# Patient Record
Sex: Male | Born: 1955
Health system: Southern US, Community
[De-identification: ages and names within clinical notes are randomized; demographics above are authoritative.]

## PROBLEM LIST (undated history)

## (undated) DIAGNOSIS — I10 Essential (primary) hypertension: Secondary | ICD-10-CM

## (undated) DIAGNOSIS — I513 Intracardiac thrombosis, not elsewhere classified: Secondary | ICD-10-CM

## (undated) DIAGNOSIS — I509 Heart failure, unspecified: Secondary | ICD-10-CM

---

## 2015-11-04 ENCOUNTER — Encounter (HOSPITAL_COMMUNITY): Payer: Self-pay | Admitting: Emergency Medicine

## 2015-11-04 ENCOUNTER — Emergency Department (HOSPITAL_COMMUNITY): Payer: Medicaid Other

## 2015-11-04 DIAGNOSIS — D62 Acute posthemorrhagic anemia: Secondary | ICD-10-CM | POA: Diagnosis not present

## 2015-11-04 DIAGNOSIS — Z79899 Other long term (current) drug therapy: Secondary | ICD-10-CM

## 2015-11-04 DIAGNOSIS — Z7901 Long term (current) use of anticoagulants: Secondary | ICD-10-CM

## 2015-11-04 DIAGNOSIS — R17 Unspecified jaundice: Secondary | ICD-10-CM | POA: Diagnosis present

## 2015-11-04 DIAGNOSIS — I5043 Acute on chronic combined systolic (congestive) and diastolic (congestive) heart failure: Secondary | ICD-10-CM | POA: Diagnosis present

## 2015-11-04 DIAGNOSIS — I214 Non-ST elevation (NSTEMI) myocardial infarction: Principal | ICD-10-CM | POA: Diagnosis present

## 2015-11-04 DIAGNOSIS — I472 Ventricular tachycardia: Secondary | ICD-10-CM | POA: Diagnosis not present

## 2015-11-04 DIAGNOSIS — D7589 Other specified diseases of blood and blood-forming organs: Secondary | ICD-10-CM | POA: Diagnosis present

## 2015-11-04 DIAGNOSIS — K648 Other hemorrhoids: Secondary | ICD-10-CM | POA: Diagnosis present

## 2015-11-04 DIAGNOSIS — N179 Acute kidney failure, unspecified: Secondary | ICD-10-CM | POA: Diagnosis not present

## 2015-11-04 DIAGNOSIS — Z23 Encounter for immunization: Secondary | ICD-10-CM

## 2015-11-04 DIAGNOSIS — I447 Left bundle-branch block, unspecified: Secondary | ICD-10-CM | POA: Diagnosis present

## 2015-11-04 DIAGNOSIS — R570 Cardiogenic shock: Secondary | ICD-10-CM | POA: Diagnosis present

## 2015-11-04 DIAGNOSIS — R634 Abnormal weight loss: Secondary | ICD-10-CM | POA: Diagnosis present

## 2015-11-04 DIAGNOSIS — I429 Cardiomyopathy, unspecified: Secondary | ICD-10-CM | POA: Diagnosis present

## 2015-11-04 DIAGNOSIS — Z87891 Personal history of nicotine dependence: Secondary | ICD-10-CM

## 2015-11-04 DIAGNOSIS — I110 Hypertensive heart disease with heart failure: Secondary | ICD-10-CM | POA: Diagnosis present

## 2015-11-04 DIAGNOSIS — D509 Iron deficiency anemia, unspecified: Secondary | ICD-10-CM | POA: Diagnosis present

## 2015-11-04 LAB — CBC
HEMATOCRIT: 39.5 % (ref 39.0–52.0)
Hemoglobin: 12.8 g/dL — ABNORMAL LOW (ref 13.0–17.0)
MCH: 33.2 pg (ref 26.0–34.0)
MCHC: 32.4 g/dL (ref 30.0–36.0)
MCV: 102.3 fL — ABNORMAL HIGH (ref 78.0–100.0)
Platelets: 240 10*3/uL (ref 150–400)
RBC: 3.86 MIL/uL — ABNORMAL LOW (ref 4.22–5.81)
RDW: 14.1 % (ref 11.5–15.5)
WBC: 4.3 10*3/uL (ref 4.0–10.5)

## 2015-11-04 LAB — BASIC METABOLIC PANEL
ANION GAP: 13 (ref 5–15)
BUN: 24 mg/dL — AB (ref 6–20)
CALCIUM: 9.2 mg/dL (ref 8.9–10.3)
CO2: 22 mmol/L (ref 22–32)
Chloride: 103 mmol/L (ref 101–111)
Creatinine, Ser: 1.6 mg/dL — ABNORMAL HIGH (ref 0.61–1.24)
GFR calc Af Amer: 53 mL/min — ABNORMAL LOW (ref >60)
GFR, EST NON AFRICAN AMERICAN: 46 mL/min — AB (ref >60)
Glucose, Bld: 149 mg/dL — ABNORMAL HIGH (ref 65–99)
POTASSIUM: 3.7 mmol/L (ref 3.5–5.1)
SODIUM: 138 mmol/L (ref 135–145)

## 2015-11-04 LAB — PROTIME-INR
INR: 2 — ABNORMAL HIGH (ref 0.00–1.49)
Prothrombin Time: 22.6 seconds — ABNORMAL HIGH (ref 11.6–15.2)

## 2015-11-04 LAB — I-STAT TROPONIN, ED: TROPONIN I, POC: 0.16 ng/mL — AB (ref 0.00–0.08)

## 2015-11-04 NOTE — ED Notes (Signed)
Pt. reports left chest pain radiating to left arm with SOB onset this evening , denies nausea or diaphoresis .

## 2015-11-05 ENCOUNTER — Inpatient Hospital Stay (HOSPITAL_COMMUNITY)
Admission: EM | Admit: 2015-11-05 | Discharge: 2015-12-06 | DRG: 001 | Disposition: A | Discharge status: Home Health | Payer: Medicaid Other | Attending: Surgery | Admitting: Surgery

## 2015-11-05 DIAGNOSIS — K648 Other hemorrhoids: Secondary | ICD-10-CM | POA: Diagnosis present

## 2015-11-05 DIAGNOSIS — I509 Heart failure, unspecified: Secondary | ICD-10-CM

## 2015-11-05 DIAGNOSIS — Z7901 Long term (current) use of anticoagulants: Secondary | ICD-10-CM | POA: Diagnosis not present

## 2015-11-05 DIAGNOSIS — D509 Iron deficiency anemia, unspecified: Secondary | ICD-10-CM | POA: Diagnosis not present

## 2015-11-05 DIAGNOSIS — I447 Left bundle-branch block, unspecified: Secondary | ICD-10-CM | POA: Diagnosis present

## 2015-11-05 DIAGNOSIS — Z23 Encounter for immunization: Secondary | ICD-10-CM | POA: Diagnosis not present

## 2015-11-05 DIAGNOSIS — I8290 Acute embolism and thrombosis of unspecified vein: Secondary | ICD-10-CM | POA: Diagnosis not present

## 2015-11-05 DIAGNOSIS — D649 Anemia, unspecified: Secondary | ICD-10-CM | POA: Diagnosis not present

## 2015-11-05 DIAGNOSIS — Z01818 Encounter for other preprocedural examination: Secondary | ICD-10-CM | POA: Diagnosis not present

## 2015-11-05 DIAGNOSIS — E440 Moderate protein-calorie malnutrition: Secondary | ICD-10-CM | POA: Insufficient documentation

## 2015-11-05 DIAGNOSIS — R634 Abnormal weight loss: Secondary | ICD-10-CM | POA: Diagnosis present

## 2015-11-05 DIAGNOSIS — N183 Chronic kidney disease, stage 3 (moderate): Secondary | ICD-10-CM | POA: Diagnosis not present

## 2015-11-05 DIAGNOSIS — R079 Chest pain, unspecified: Secondary | ICD-10-CM | POA: Diagnosis present

## 2015-11-05 DIAGNOSIS — R570 Cardiogenic shock: Secondary | ICD-10-CM | POA: Diagnosis not present

## 2015-11-05 DIAGNOSIS — I5043 Acute on chronic combined systolic (congestive) and diastolic (congestive) heart failure: Secondary | ICD-10-CM

## 2015-11-05 DIAGNOSIS — D508 Other iron deficiency anemias: Secondary | ICD-10-CM | POA: Insufficient documentation

## 2015-11-05 DIAGNOSIS — I429 Cardiomyopathy, unspecified: Secondary | ICD-10-CM | POA: Diagnosis present

## 2015-11-05 DIAGNOSIS — I214 Non-ST elevation (NSTEMI) myocardial infarction: Principal | ICD-10-CM

## 2015-11-05 DIAGNOSIS — R17 Unspecified jaundice: Secondary | ICD-10-CM | POA: Diagnosis present

## 2015-11-05 DIAGNOSIS — Z1211 Encounter for screening for malignant neoplasm of colon: Secondary | ICD-10-CM | POA: Diagnosis not present

## 2015-11-05 DIAGNOSIS — I5022 Chronic systolic (congestive) heart failure: Secondary | ICD-10-CM

## 2015-11-05 DIAGNOSIS — I428 Other cardiomyopathies: Secondary | ICD-10-CM

## 2015-11-05 DIAGNOSIS — Z79899 Other long term (current) drug therapy: Secondary | ICD-10-CM | POA: Diagnosis not present

## 2015-11-05 DIAGNOSIS — R0602 Shortness of breath: Secondary | ICD-10-CM

## 2015-11-05 DIAGNOSIS — Z87891 Personal history of nicotine dependence: Secondary | ICD-10-CM | POA: Diagnosis not present

## 2015-11-05 DIAGNOSIS — Z515 Encounter for palliative care: Secondary | ICD-10-CM | POA: Diagnosis not present

## 2015-11-05 DIAGNOSIS — I5023 Acute on chronic systolic (congestive) heart failure: Secondary | ICD-10-CM | POA: Diagnosis not present

## 2015-11-05 DIAGNOSIS — I472 Ventricular tachycardia: Secondary | ICD-10-CM | POA: Diagnosis not present

## 2015-11-05 DIAGNOSIS — Z95811 Presence of heart assist device: Secondary | ICD-10-CM

## 2015-11-05 DIAGNOSIS — D7589 Other specified diseases of blood and blood-forming organs: Secondary | ICD-10-CM | POA: Diagnosis present

## 2015-11-05 DIAGNOSIS — Z9689 Presence of other specified functional implants: Secondary | ICD-10-CM

## 2015-11-05 DIAGNOSIS — I110 Hypertensive heart disease with heart failure: Secondary | ICD-10-CM | POA: Diagnosis present

## 2015-11-05 DIAGNOSIS — D62 Acute posthemorrhagic anemia: Secondary | ICD-10-CM | POA: Diagnosis not present

## 2015-11-05 DIAGNOSIS — I213 ST elevation (STEMI) myocardial infarction of unspecified site: Secondary | ICD-10-CM | POA: Diagnosis not present

## 2015-11-05 DIAGNOSIS — I513 Intracardiac thrombosis, not elsewhere classified: Secondary | ICD-10-CM | POA: Diagnosis present

## 2015-11-05 DIAGNOSIS — I519 Heart disease, unspecified: Secondary | ICD-10-CM | POA: Diagnosis not present

## 2015-11-05 DIAGNOSIS — J181 Lobar pneumonia, unspecified organism: Secondary | ICD-10-CM | POA: Diagnosis not present

## 2015-11-05 DIAGNOSIS — N179 Acute kidney failure, unspecified: Secondary | ICD-10-CM | POA: Diagnosis not present

## 2015-11-05 HISTORY — DX: Heart failure, unspecified: I50.9

## 2015-11-05 HISTORY — DX: Intracardiac thrombosis, not elsewhere classified: I51.3

## 2015-11-05 HISTORY — DX: Essential (primary) hypertension: I10

## 2015-11-05 LAB — PROTIME-INR
INR: 2.08 — ABNORMAL HIGH (ref 0.00–1.49)
PROTHROMBIN TIME: 23.2 s — AB (ref 11.6–15.2)

## 2015-11-05 LAB — TROPONIN I
TROPONIN I: 0.78 ng/mL — AB (ref <0.031)
TROPONIN I: 4.8 ng/mL — AB (ref <0.031)
Troponin I: 2.48 ng/mL (ref <0.031)

## 2015-11-05 LAB — BASIC METABOLIC PANEL
Anion gap: 16 — ABNORMAL HIGH (ref 5–15)
BUN: 25 mg/dL — AB (ref 6–20)
CHLORIDE: 105 mmol/L (ref 101–111)
CO2: 17 mmol/L — ABNORMAL LOW (ref 22–32)
CREATININE: 1.49 mg/dL — AB (ref 0.61–1.24)
Calcium: 8.9 mg/dL (ref 8.9–10.3)
GFR, EST AFRICAN AMERICAN: 58 mL/min — AB (ref >60)
GFR, EST NON AFRICAN AMERICAN: 50 mL/min — AB (ref >60)
Glucose, Bld: 116 mg/dL — ABNORMAL HIGH (ref 65–99)
POTASSIUM: 4 mmol/L (ref 3.5–5.1)
SODIUM: 138 mmol/L (ref 135–145)

## 2015-11-05 LAB — MAGNESIUM: Magnesium: 1.7 mg/dL (ref 1.7–2.4)

## 2015-11-05 LAB — MRSA PCR SCREENING: MRSA BY PCR: NEGATIVE

## 2015-11-05 LAB — HEPARIN LEVEL (UNFRACTIONATED)
HEPARIN UNFRACTIONATED: 0.51 [IU]/mL (ref 0.30–0.70)
Heparin Unfractionated: 0.25 IU/mL — ABNORMAL LOW (ref 0.30–0.70)

## 2015-11-05 MED ORDER — ASPIRIN 325 MG PO TABS
325.0000 mg | ORAL_TABLET | Freq: Once | ORAL | Status: AC
  Administered 2015-11-05: 325 mg via ORAL
  Filled 2015-11-05: qty 1

## 2015-11-05 MED ORDER — ATORVASTATIN CALCIUM 80 MG PO TABS
80.0000 mg | ORAL_TABLET | Freq: Every day | ORAL | Status: DC
  Administered 2015-11-05 – 2015-11-07 (×3): 80 mg via ORAL
  Filled 2015-11-05 (×3): qty 1

## 2015-11-05 MED ORDER — CETYLPYRIDINIUM CHLORIDE 0.05 % MT LIQD
7.0000 mL | Freq: Two times a day (BID) | OROMUCOSAL | Status: DC
  Administered 2015-11-05 – 2015-11-12 (×13): 7 mL via OROMUCOSAL

## 2015-11-05 MED ORDER — METOPROLOL TARTRATE 12.5 MG HALF TABLET
12.5000 mg | ORAL_TABLET | Freq: Every day | ORAL | Status: DC
  Administered 2015-11-05 – 2015-11-07 (×3): 12.5 mg via ORAL
  Filled 2015-11-05 (×3): qty 1

## 2015-11-05 MED ORDER — FUROSEMIDE 10 MG/ML IJ SOLN
40.0000 mg | Freq: Once | INTRAMUSCULAR | Status: AC
  Administered 2015-11-05: 40 mg via INTRAVENOUS
  Filled 2015-11-05: qty 4

## 2015-11-05 MED ORDER — ONDANSETRON HCL 4 MG/2ML IJ SOLN: 4.0000 mg | Freq: Four times a day (QID) | INTRAMUSCULAR | Status: DC | PRN

## 2015-11-05 MED ORDER — ACETAMINOPHEN 325 MG PO TABS
650.0000 mg | ORAL_TABLET | ORAL | Status: DC | PRN
  Administered 2015-11-05: 650 mg via ORAL
  Filled 2015-11-05: qty 2

## 2015-11-05 MED ORDER — NITROGLYCERIN 0.4 MG SL SUBL
0.4000 mg | SUBLINGUAL_TABLET | SUBLINGUAL | Status: DC | PRN
Start: 2015-11-05 — End: 2015-11-21

## 2015-11-05 MED ORDER — NITROGLYCERIN 0.4 MG SL SUBL
0.4000 mg | SUBLINGUAL_TABLET | SUBLINGUAL | Status: AC | PRN
  Administered 2015-11-05 (×3): 0.4 mg via SUBLINGUAL
  Filled 2015-11-05: qty 1

## 2015-11-05 MED ORDER — MAGNESIUM SULFATE 2 GM/50ML IV SOLN
2.0000 g | Freq: Once | INTRAVENOUS | Status: AC
  Administered 2015-11-05: 2 g via INTRAVENOUS
  Filled 2015-11-05: qty 50

## 2015-11-05 MED ORDER — NITROGLYCERIN IN D5W 200-5 MCG/ML-% IV SOLN
10.0000 ug/min | INTRAVENOUS | Status: DC
  Administered 2015-11-05: 10 ug/min via INTRAVENOUS
  Administered 2015-11-07: 15 ug/min via INTRAVENOUS
  Filled 2015-11-05 (×2): qty 250

## 2015-11-05 MED ORDER — HEPARIN (PORCINE) IN NACL 100-0.45 UNIT/ML-% IJ SOLN
1250.0000 [IU]/h | INTRAMUSCULAR | Status: DC
  Administered 2015-11-05: 1100 [IU]/h via INTRAVENOUS
  Administered 2015-11-06 (×2): 1250 [IU]/h via INTRAVENOUS
  Filled 2015-11-05 (×3): qty 250

## 2015-11-05 MED ORDER — ASPIRIN EC 81 MG PO TBEC
81.0000 mg | DELAYED_RELEASE_TABLET | Freq: Every day | ORAL | Status: DC
  Administered 2015-11-06: 81 mg via ORAL
  Filled 2015-11-05: qty 1

## 2015-11-05 NOTE — Progress Notes (Signed)
ANTICOAGULATION CONSULT NOTE - Initial Consult  Pharmacy Consult for Heparin Indication: NSTEMI  No Known Allergies  Patient Measurements: Height: 5\' 11"  (180.3 cm) Weight: 166 lb 6.4 oz (75.479 kg) IBW/kg (Calculated) : 75.3  Vital Signs: Temp: 98.8 F (37.1 C) (04/22 0509) Temp Source: Oral (04/22 0509) BP: 109/90 mmHg (04/22 0821) Pulse Rate: 89 (04/22 0821)  Labs:  Recent Labs  11/04/15 2248 11/05/15 0130 11/05/15 5364 11/05/15 1033 11/05/15 1056  HGB 12.8*  --   --   --   --   HCT 39.5  --   --   --   --   PLT 240  --   --   --   --   LABPROT 22.6*  --  23.2*  --   --   INR 2.00*  --  2.08*  --   --   HEPARINUNFRC  --   --   --  0.25*  --   CREATININE 1.60*  --   --   --  1.49*  TROPONINI  --  0.78* 2.48*  --   --     Estimated Creatinine Clearance: 57.6 mL/min (by C-G formula based on Cr of 1.49).   Medical History: Past Medical History  Diagnosis Date  . CHF (congestive heart failure) (HCC)   . Hypertension     Medications:  See electronic med rec  Assessment: 60 y.o. M presents with NSTEMI. Trop up to 0.78>2.48. Pt on coumadin PTA for LV thrombus. Admit INR 2>2.08. CBC ok. Plan to hold coumadin and start heparin bridge with potential need for cath. HL this AM is low at 0.25 on 1100 units/hr.   Goal of Therapy:  Heparin level 0.3-0.7 units/ml Monitor platelets by anticoagulation protocol: Yes   Plan:  - Increase heparin infusion to 1250 units/hr - 6-hr HL @ 1730 - Daily HL, CBC - INR again in a.m. to verify that trending down  Schuyler Behan L. Roseanne Reno, PharmD PGY2 Infectious Diseases Pharmacy Resident Pager: [phone] 11/05/2015 11:28 AM

## 2015-11-05 NOTE — ED Notes (Signed)
Family at bedside. 

## 2015-11-05 NOTE — Progress Notes (Signed)
CCMD called about patient having 4 beats v-tach, as well as increased HR just sitting still up to 130's. Pt states, "I felt some fluttering". Paged Loney Loh, MD. MD verbal order for Magnesium level. Will continue to monitor closely.

## 2015-11-05 NOTE — ED Notes (Signed)
Attempted to call report on pt.

## 2015-11-05 NOTE — Progress Notes (Signed)
ANTICOAGULATION CONSULT NOTE - Initial Consult  Pharmacy Consult for Heparin Indication: NSTEMI  No Known Allergies  Patient Measurements: Height: 5\' 11"  (180.3 cm) Weight: 166 lb (75.297 kg) IBW/kg (Calculated) : 75.3  Vital Signs: Temp: 97.6 F (36.4 C) (04/21 2241) Temp Source: Oral (04/21 2241) BP: 95/79 mmHg (04/22 0330) Pulse Rate: 91 (04/22 0330)  Labs:  Recent Labs  11/04/15 2248 11/05/15 0130  HGB 12.8*  --   HCT 39.5  --   PLT 240  --   LABPROT 22.6*  --   INR 2.00*  --   CREATININE 1.60*  --   TROPONINI  --  0.78*    Estimated Creatinine Clearance: 53.6 mL/min (by C-G formula based on Cr of 1.6).   Medical History: Past Medical History  Diagnosis Date  . CHF (congestive heart failure) (HCC)   . Hypertension     Medications:  See electronic med rec  Assessment: 60 y.o. M presents with NSTEMI. Trop up to 0.78. Pt on coumadin PTA for LV thrombus. Admit INR 2. CBC ok on at baseline. Plan to hold coumadin and start heparin bridge with potential need for cath.  Goal of Therapy:  Heparin level 0.3-0.7 units/ml Monitor platelets by anticoagulation protocol: Yes   Plan:  Heparin gtt at 1100 units/hr. No bolus. Will f/u heparin level in 6 hours Daily heparin level and CBC INR in a.m. to verify that trending down  Christoper Fabian, PharmD, BCPS Clinical pharmacist, pager [phone] 11/05/2015,4:07 AM

## 2015-11-05 NOTE — H&P (Signed)
Patient ID: Matthew Vaughan MRN: [id number], DOB/AGE: [birth year]   Admit date: 11/05/2015  Primary Physician: No primary care provider on file. Primary Cardiologist: [facility] Reason for admission:  ADHF/NSTEMI  Pt. Profile:  Matthew Vaughan is a 60 y.o. male with a self-reported history of NICM and chronic systolic heart failure with LVEF=20% and HTN who presented to the ED with 3 days of worsening DOE and intermittent SSCP.  The pt is followed by a cardiologist at [facility] who manages his heart failure, and he notes that this physician increased his furosemide last week given worsening volume status.  Unfortunately he continued to develop worsening LE edema and abdominal distension over the past several days, and 3 days ago he began to experience SSCP.  The pain occurs both at rest and with exertion, but this evening came on at rest and at a greater intensity than previous, prompting presentation to the ED.  The pain is described as "pressure" and "heaviness", with radiation to the left arm, and is associated with an increase in his baseline dyspnea.  Since arrival to the ED Matthew Vaughan has remained hemodynamically stable and saturating well on RA.  Initial troponin drawn in the waiting room was 0.16 and repeat 4 hours later was 0.78.  Notably, the patient did not receive SLNG until this second troponin was finalized, and 2 tablets reduced his pain from a "8/10" to a "4/10".  The pt is currently resting comfortably in bed, laying at a 45 degree angle.  He continues to endorse mild SSCP.  He denies recent PND, but has experienced orthopnea over recent days in addition to the LE swelling and increased abdominal girth.   Problem List  Past Medical History  Diagnosis Date  . CHF (congestive heart failure) (HCC)   . Hypertension     History reviewed. No pertinent past surgical history.   Allergies  No Known Allergies   Home Medications  Prior to Admission medications    Medication Sig Start Date End Date Taking? Authorizing Provider  LASIX 40 MG tablet Take 40-80 mg by mouth See admin instructions. Alternate taking 1 tablet one day then take 2 tablets the next day 10/25/15  Yes Historical Provider, MD  metoprolol tartrate (LOPRESSOR) 25 MG tablet Take 12.5 mg by mouth daily.   Yes Historical Provider, MD  warfarin (COUMADIN) 2.5 MG tablet Take 2.5 mg by mouth daily. 10/13/15  Yes Historical Provider, MD    Family History Father with HF, otherwise reviewed and non-contributory   Social History  Social History   Social History  . Marital Status: Divorced    Spouse Name: N/A  . Number of Children: N/A  . Years of Education: N/A   Occupational History  . Not on file.   Social History Main Topics  . Smoking status: Former Games developer  . Smokeless tobacco: Not on file  . Alcohol Use: No  . Drug Use: No  . Sexual Activity: Not on file   Other Topics Concern  . Not on file   Social History Narrative     Review of Systems General:  No chills, fever, night sweats or weight changes.  Cardiovascular:  As per HPI Dermatological: No rash, lesions/masses Respiratory: As per HPI Urologic: No hematuria, dysuria Abdominal:   No nausea, vomiting, diarrhea, bright red blood per rectum, melena, or hematemesis Neurologic:  No visual changes, wkns, changes in mental status. All other systems reviewed and are otherwise negative except as noted above.  Physical Exam  Blood pressure 97/84, pulse 87, temperature 97.6 F (36.4 C), temperature source Oral, resp. rate 28, height  (1.803 m), weight 75.297 kg (166 lb), SpO2 100 %.  General: Pleasant, NAD Psych: Normal affect. Neuro: Alert and oriented X 3. Moves all extremities spontaneously. HEENT: Normal, mmm  Neck: Supple without bruits, +JVD Lungs:  Use of accessory muscles of respiration but lungs CTAB Heart: tachycardic, regular rhythm, +S1 +S2, no appreciable m/r/g, JVP at 15cm and w/ +hepatojugular  reflex, 2+ pitting edema to the mid-tibia bilaterally, feet mildly cool to the touch Abdomen: Soft, mildly distended, NTTP throughout Extremities: No clubbing, cyanosis. DP/PT/Radials 2+ and equal bilaterally.  Labs   Recent Labs  11/05/15 0130  TROPONINI 0.78*   Lab Results  Component Value Date   WBC 4.3 11/04/2015   HGB 12.8* 11/04/2015   HCT 39.5 11/04/2015   MCV 102.3* 11/04/2015   PLT 240 11/04/2015    Recent Labs Lab 11/04/15 2248  NA 138  K 3.7  CL 103  CO2 22  BUN 24*  CREATININE 1.60*  CALCIUM 9.2  GLUCOSE 149*   No results found for: CHOL, HDL, LDLCALC, TRIG No results found for: DDIMER   Radiology/Studies  Dg Chest 2 View  11/04/2015  CLINICAL DATA:  Chest pain and shortness of breath for 1 day EXAM: CHEST  2 VIEW COMPARISON:  None. FINDINGS: Cardiac enlargement without vascular congestion. Focal area of nodular consolidation in the right upper lung probably represents focal pneumonia. Soft tissue lesion is not excluded. Followup PA and lateral chest X-ray is recommended in 3-4 weeks following trial of antibiotic therapy to ensure resolution and exclude underlying malignancy. Left lung is clear. No blunting of costophrenic angles. No pneumothorax. Mediastinal contours appear intact. Degenerative changes in the spine and shoulders. IMPRESSION: Cardiac enlargement. Focal nodular consolidation at the right upper lung likely represent pneumonia. Followup PA and lateral chest X-ray is recommended in 3-4 weeks following trial of antibiotic therapy to ensure resolution and exclude underlying malignancy. Electronically Signed   By: Burman Nieves M.D.   On: 11/04/2015 23:00    ECG: per my review, tracing from this morning shows NSR with a LBBB   ASSESSMENT - NSTEMI - Acute on chronic systolic heart failure - non-ischemic cardiomyopathy - Renal insuffiencey of unknown chronicity - macrocytosis - report of LV thrombus, pt on warfarin  PLAN The pt has evidence  of acute decompensated heart failure with increasing DOE and edema over recent days and elevated JVP on examination today.  He reports normal coronary angiography during initial w/u for his cardiomyopathy a year and half ago, and he has experienced CP with previous heart failure exacerbations, albeit not as intense as the discomfort he experienced this evening.  Will treat ADHF with intravenous diuretics, but also manage for possible acute plaque rupture event with heparin gtt, full dose ASA, and high-intensity statin.  If chest pain does not further decrease, or should it again increase in intensity, then will discuss with interventional attending given possible need for coronary angiography. - furosemide  IV x 1 now - heparin gtt, both for NSTEMI management and reported LV thrombus.  In this regard, holding home warfarin given potential need for cath - Atorvastatin  PO QDAY - ASA  PO x 1 in ED, now  PO QDAY - Metoprolol 12.5mg  PO BID - nitro gtt if CP persists - next troponin check at 530am - continuous telemetry - obtain records from [place] in am    Signed, Ollen Gross Corotto,  MD  11/05/2015, 3:26 AM

## 2015-11-05 NOTE — Progress Notes (Signed)
ANTICOAGULATION CONSULT NOTE - f/u Consult  Pharmacy Consult for Heparin Indication: NSTEMI  No Known Allergies  Patient Measurements: Height: 5\' 11"  (180.3 cm) Weight: 166 lb 6.4 oz (75.479 kg) IBW/kg (Calculated) : 75.3  Vital Signs: Temp: 97.5 F (36.4 C) (04/22 1250) Temp Source: Oral (04/22 1250) BP: 102/76 mmHg (04/22 1250) Pulse Rate: 84 (04/22 1250)  Labs:  Recent Labs  11/04/15 2248 11/05/15 0130 11/05/15 0638 11/05/15 1033 11/05/15 1056 11/05/15 1213 11/05/15 1750  HGB 12.8*  --   --   --   --   --   --   HCT 39.5  --   --   --   --   --   --   PLT 240  --   --   --   --   --   --   LABPROT 22.6*  --  23.2*  --   --   --   --   INR 2.00*  --  2.08*  --   --   --   --   HEPARINUNFRC  --   --   --  0.25*  --   --  0.51  CREATININE 1.60*  --   --   --  1.49*  --   --   TROPONINI  --  0.78* 2.48*  --   --  4.80*  --     Estimated Creatinine Clearance: 57.6 mL/min (by C-G formula based on Cr of 1.49).   Medical History: Past Medical History  Diagnosis Date  . CHF (congestive heart failure) (HCC)   . Hypertension     Medications:  See electronic med rec  Assessment: 60 y.o. M presents with NSTEMI. Trop up to 0.78>2.48. Pt on coumadin PTA for LV thrombus. Admit INR 2>2.08. CBC ok. Plan to hold coumadin and start heparin bridge with potential need for cath. -- Heparin level 0.25 low this AM now in goal range at 0.51  Goal of Therapy:  Heparin level 0.3-0.7 units/ml Monitor platelets by anticoagulation protocol: Yes   Plan:  - Continue heparin infusion to 1250 units/hr - Daily HL, CBC - INR again in a.m. to verify that trending down  Nithila Sumners S. Merilynn Finland, PharmD, [facility] Clinical Staff Pharmacist Pager [phone]   11/05/2015 6:32 PM

## 2015-11-05 NOTE — ED Provider Notes (Signed)
CSN: [id number]     Arrival date & time 11/04/15  2230 History  By signing my name below, I, Phillis Haggis, attest that this documentation has been prepared under the direction and in the presence of No att. providers found. Electronically Signed: Phillis Haggis, ED Scribe. 11/05/2015. 1:39 AM.   Chief Complaint  Patient presents with  . Chest Pain   The history is provided by the patient. No language interpreter was used.    HPI Comments: Matthew Vaughan is a 60 y.o. Male with a hx of CHF and HTN who presents to the Emergency Department complaining of constant, gradually worsening, sharp, pressured left chest pain that radiates to the left arm onset 3 hours ago. Pt reports associated SOB. Pt states that he was sitting when the pain started. He reports that he is due to have a pacemaker placed on 11/29/15. He was told by his cardiologist that he has a blood clot in his heart and was started on Warfarin. He has not taken his warfarin today. Pt had a heart cath one year ago. He denies hx of heart attack. He denies vomiting, nausea, or diaphoresis.   Past Medical History  Diagnosis Date  . CHF (congestive heart failure) (HCC)   . Hypertension    History reviewed. No pertinent past surgical history. No family history on file. Social History  Substance Use Topics  . Smoking status: Former Games developer  . Smokeless tobacco: None  . Alcohol Use: No    Review of Systems 10 Systems reviewed and all are negative for acute change except as noted in the HPI.  Allergies  Review of patient's allergies indicates no known allergies.  Home Medications   Prior to Admission medications   Not on File   BP 114/98 mmHg  Pulse 102  Temp(Src) 97.6 F (36.4 C) (Oral)  Resp 20  Ht 5\' 11"  (1.803 m)  Wt 166 lb (75.297 kg)  BMI 23.16 kg/m2  SpO2 100% Physical Exam  Constitutional: He is oriented to person, place, and time. Vital signs are normal. He appears well-developed and well-nourished.  Non-toxic  appearance. He does not appear ill. No distress.  HENT:  Head: Normocephalic and atraumatic.  Nose: Nose normal.  Mouth/Throat: Oropharynx is clear and moist. No oropharyngeal exudate.  Eyes: Conjunctivae and EOM are normal. Pupils are equal, round, and reactive to light. No scleral icterus.  Neck: Normal range of motion. Neck supple. No tracheal deviation, no edema, no erythema and normal range of motion present. No thyroid mass and no thyromegaly present.  Cardiovascular: Normal rate, regular rhythm, S1 normal, S2 normal, normal heart sounds, intact distal pulses and normal pulses.  Exam reveals no gallop and no friction rub.   No murmur heard. Pulmonary/Chest: Effort normal and breath sounds normal. No respiratory distress. He has no wheezes. He has no rhonchi. He has no rales.  Abdominal: Soft. Normal appearance and bowel sounds are normal. He exhibits no distension, no ascites and no mass. There is no hepatosplenomegaly. There is no tenderness. There is no rebound, no guarding and no CVA tenderness.  Musculoskeletal: Normal range of motion. He exhibits edema. He exhibits no tenderness.  Bilateral lower extremity edema  Lymphadenopathy:    He has no cervical adenopathy.  Neurological: He is alert and oriented to person, place, and time. He has normal strength. No cranial nerve deficit or sensory deficit.  Skin: Skin is warm, dry and intact. No petechiae and no rash noted. He is not diaphoretic. No erythema. No pallor.  Psychiatric: He has a normal mood and affect. His behavior is normal. Judgment normal.  Nursing note and vitals reviewed.   ED Course  Procedures (including critical care time) DIAGNOSTIC STUDIES: Oxygen Saturation is 100% on RA, normal by my interpretation.    COORDINATION OF CARE: 1:43 AM-Discussed treatment plan which includes labs, EKG, and x-ray with pt at bedside and pt agreed to plan.    Labs Review Labs Reviewed  BASIC METABOLIC PANEL - Abnormal; Notable for  the following:    Glucose, Bld 149 (*)    BUN 24 (*)    Creatinine, Ser 1.60 (*)    GFR calc non Af Amer 46 (*)    GFR calc Af Amer 53 (*)    All other components within normal limits  CBC - Abnormal; Notable for the following:    RBC 3.86 (*)    Hemoglobin 12.8 (*)    MCV 102.3 (*)    All other components within normal limits  PROTIME-INR - Abnormal; Notable for the following:    Prothrombin Time 22.6 (*)    INR 2.00 (*)    All other components within normal limits  TROPONIN I - Abnormal; Notable for the following:    Troponin I 0.78 (*)    All other components within normal limits  I-STAT TROPOININ, ED - Abnormal; Notable for the following:    Troponin i, poc 0.16 (*)    All other components within normal limits    Imaging Review Dg Chest 2 View  11/04/2015  CLINICAL DATA:  Chest pain and shortness of breath for 1 day EXAM: CHEST  2 VIEW COMPARISON:  None. FINDINGS: Cardiac enlargement without vascular congestion. Focal area of nodular consolidation in the right upper lung probably represents focal pneumonia. Soft tissue lesion is not excluded. Followup PA and lateral chest X-ray is recommended in 3-4 weeks following trial of antibiotic therapy to ensure resolution and exclude underlying malignancy. Left lung is clear. No blunting of costophrenic angles. No pneumothorax. Mediastinal contours appear intact. Degenerative changes in the spine and shoulders. IMPRESSION: Cardiac enlargement. Focal nodular consolidation at the right upper lung likely represent pneumonia. Followup PA and lateral chest X-ray is recommended in 3-4 weeks following trial of antibiotic therapy to ensure resolution and exclude underlying malignancy. Electronically Signed   By: Burman Nieves M.D.   On: 11/04/2015 23:00   I have personally reviewed and evaluated these images and lab results as part of my medical decision-making.   EKG Interpretation   Date/Time:  Friday November 04 2015 22:33:57 EDT Ventricular  Rate:  104 PR Interval:  158 QRS Duration: 158 QT Interval:  414 QTC Calculation: 544 R Axis:   133 Text Interpretation:  Sinus tachycardia Possible Left atrial enlargement  Non-specific intra-ventricular conduction block Possible Lateral infarct ,  age undetermined Abnormal ECG No old tracing to compare neg scabossa  criteria Confirmed by Erroll Luna [phone]) on 11/05/2015 1:09:54  AM      MDM   Final diagnoses:  None    Patient presents to the ED for chest pain.  History is concerning for ACS.  He is having ongoing chest pain.  Troponin is elevated and is rising in the ED.  I have paged cardiology for admission.  He was given ASA and nitro.  CXR reads possible R side pneumonia but his history is not at all consistent with this.  Patient will require admission for further care.  I spoke with Renae Fickle with cardiology who accepts patient for  admission.  After 2 nitro his pain has not resolved.  Will place on nitro gtt as directed by cardiology.   CRITICAL CARE Performed by: Tomasita Crumble   Total critical care time: 45 min - NSTEMI   Critical care time was exclusive of separately billable procedures and treating other patients.  Critical care was necessary to treat or prevent imminent or life-threatening deterioration.  Critical care was time spent personally by me on the following activities: development of treatment plan with patient and/or surrogate as well as nursing, discussions with consultants, evaluation of patient's response to treatment, examination of patient, obtaining history from patient or surrogate, ordering and performing treatments and interventions, ordering and review of laboratory studies, ordering and review of radiographic studies, pulse oximetry and re-evaluation of patient's condition.   I personally performed the services described in this documentation, which was scribed in my presence. The recorded information has been reviewed and is accurate.      Tomasita Crumble, MD 11/05/15 [phone]

## 2015-11-05 NOTE — Progress Notes (Signed)
Pt being treated for decompensated heart failure and troponin elevation. Will administer one more dose of IV Lasix this afternoon and check another serum troponin.

## 2015-11-05 NOTE — ED Notes (Signed)
MD at the bedside, requested 40mg  of lasix, reviewed nitro drip with bp readings.

## 2015-11-05 NOTE — Progress Notes (Signed)
5009:  Patient had 12 beats VTACH. 0947:  Patient had 7 beats VTACH  Ervin Demark, PA notified.  Order for stat BMET received.

## 2015-11-05 NOTE — Plan of Care (Signed)
Problem: Phase I Progression Outcomes Goal: Anginal pain relieved Outcome: Progressing Patient presents to unit with acute, mid-sternal chest pressure, non-radiating, rated as 3/10. Pressure is associated with DOE, tachypnea, mild tachycardia with questionable new-onset left bundle branch block (no old EKG to compare) and dry, non-productive cough. JVD is present as well as 3+ pitting lower extremity edema. No other reported symptoms during intake assessment.  Vital signs on arrival stable, but reflecting signs/symptoms as stated above: Today's Vitals    11/05/15 0409 11/05/15 0444 11/05/15 0509 11/05/15 0515  BP:   105/86 108/86    Pulse:   93 95    Temp:     98.8 F (37.1 C)    TempSrc:     Oral    Resp:   26 27    Height:          Weight:          SpO2:   2% 99%    PainSc: 2    3  0-No pain    Titrated IV nitroglycerin from 10 to 15 mcg/min. Patient reported resolution of chest pressure at approximately 5 minutes.  Patient was given IV lasix prior to transfer from ED, but has not yet urinated. Provided collection device at bedside.  Educated patient on arrival to unit re:  Plans of care (CHF, chest pain R/O MI)  CHF stepdown ICU initial monitoring/treatment phase  High dose IV diuretics  Intake/output monitoring  Daily standing weight  Vital signs every 4 hours  Chest pain protocol  Notify RN for all symptoms  IV nitroglycerin  IV heparin  EKG as needed for chest pain/pressure not relieved by IV nitroglycerin  Bed rest, toileting with assistance  Labs/tests/procedures  Serial troponin I  Serial 12-lead EKG  2-D echocardiogram (likely, not currently ordered)  Cardiac catheterization (likely, not currently ordered)  Unit routines  Orientation to call light/telephone for assistance  Safety plan  NPO pending plan  Infection control/MRSA PCR swab/CHG bath  Patient stated he has been aware of his heart failure for 2 years and has been taking his  medications as ordered. He does not routinely check weight or blood pressure at home. He states that his cardiologist has told him that he will need a "3-wire defibrillator;" assume this to mean a bi-ventricular ICD. Patient also reported a positive MRSA test result from a previous encounter at a different facility; placed on contact precautions based on this.  Since arriving to floor Mr. Melhado has had frequent multifocal PVCs and one run of NSVT versus wide QRS tachycardia; strip posted to shadow chart.  Patient has no acute symptoms or concerns at this time. Will need to obtain records from patient's PCP and cardiologist if possible today. This process should be addressed during normal business hours; will communicate in patient hand-off.  Continuing to closely monitor.

## 2015-11-06 DIAGNOSIS — N183 Chronic kidney disease, stage 3 (moderate): Secondary | ICD-10-CM

## 2015-11-06 DIAGNOSIS — I8290 Acute embolism and thrombosis of unspecified vein: Secondary | ICD-10-CM

## 2015-11-06 DIAGNOSIS — I519 Heart disease, unspecified: Secondary | ICD-10-CM

## 2015-11-06 DIAGNOSIS — J181 Lobar pneumonia, unspecified organism: Secondary | ICD-10-CM

## 2015-11-06 DIAGNOSIS — I5023 Acute on chronic systolic (congestive) heart failure: Secondary | ICD-10-CM

## 2015-11-06 LAB — BASIC METABOLIC PANEL
Anion gap: 11 (ref 5–15)
Anion gap: 14 (ref 5–15)
BUN: 23 mg/dL — ABNORMAL HIGH (ref 6–20)
BUN: 23 mg/dL — AB (ref 6–20)
CALCIUM: 9 mg/dL (ref 8.9–10.3)
CHLORIDE: 103 mmol/L (ref 101–111)
CO2: 21 mmol/L — AB (ref 22–32)
CO2: 22 mmol/L (ref 22–32)
Calcium: 8.7 mg/dL — ABNORMAL LOW (ref 8.9–10.3)
Chloride: 101 mmol/L (ref 101–111)
Creatinine, Ser: 1.29 mg/dL — ABNORMAL HIGH (ref 0.61–1.24)
Creatinine, Ser: 1.46 mg/dL — ABNORMAL HIGH (ref 0.61–1.24)
GFR calc Af Amer: 59 mL/min — ABNORMAL LOW (ref >60)
GFR calc non Af Amer: 60 mL/min — ABNORMAL LOW (ref >60)
GFR, EST NON AFRICAN AMERICAN: 51 mL/min — AB (ref >60)
Glucose, Bld: 130 mg/dL — ABNORMAL HIGH (ref 65–99)
Glucose, Bld: 123 mg/dL — ABNORMAL HIGH (ref 65–99)
POTASSIUM: 3.7 mmol/L (ref 3.5–5.1)
Potassium: 3.8 mmol/L (ref 3.5–5.1)
SODIUM: 135 mmol/L (ref 135–145)
SODIUM: 137 mmol/L (ref 135–145)

## 2015-11-06 LAB — CBC
HEMATOCRIT: 35.2 % — AB (ref 39.0–52.0)
HEMOGLOBIN: 11.5 g/dL — AB (ref 13.0–17.0)
MCH: 32.6 pg (ref 26.0–34.0)
MCHC: 32.7 g/dL (ref 30.0–36.0)
MCV: 99.7 fL (ref 78.0–100.0)
Platelets: 228 10*3/uL (ref 150–400)
RBC: 3.53 MIL/uL — AB (ref 4.22–5.81)
RDW: 13.9 % (ref 11.5–15.5)
WBC: 4.2 10*3/uL (ref 4.0–10.5)

## 2015-11-06 LAB — PROTIME-INR
INR: 1.92 — AB (ref 0.00–1.49)
PROTHROMBIN TIME: 21.9 s — AB (ref 11.6–15.2)

## 2015-11-06 LAB — HEPARIN LEVEL (UNFRACTIONATED): HEPARIN UNFRACTIONATED: 0.7 [IU]/mL (ref 0.30–0.70)

## 2015-11-06 LAB — MAGNESIUM: Magnesium: 2.2 mg/dL (ref 1.7–2.4)

## 2015-11-06 MED ORDER — ASPIRIN 81 MG PO CHEW
81.0000 mg | CHEWABLE_TABLET | ORAL | Status: AC
  Administered 2015-11-07: 81 mg via ORAL
  Filled 2015-11-06: qty 1

## 2015-11-06 MED ORDER — PNEUMOCOCCAL VAC POLYVALENT 25 MCG/0.5ML IJ INJ
0.5000 mL | INJECTION | INTRAMUSCULAR | Status: AC
  Administered 2015-11-12: 0.5 mL via INTRAMUSCULAR
  Filled 2015-11-06 (×2): qty 0.5

## 2015-11-06 MED ORDER — SODIUM CHLORIDE 0.9% FLUSH: 3.0000 mL | Freq: Two times a day (BID) | INTRAVENOUS | Status: DC

## 2015-11-06 MED ORDER — SODIUM CHLORIDE 0.9 % IV SOLN: 250.0000 mL | INTRAVENOUS | Status: DC | PRN

## 2015-11-06 MED ORDER — SODIUM CHLORIDE 0.9 % IV SOLN
INTRAVENOUS | Status: DC
  Administered 2015-11-07: 06:00:00 via INTRAVENOUS

## 2015-11-06 MED ORDER — FUROSEMIDE 10 MG/ML IJ SOLN
40.0000 mg | Freq: Once | INTRAMUSCULAR | Status: AC
  Administered 2015-11-06: 40 mg via INTRAVENOUS
  Filled 2015-11-06: qty 4

## 2015-11-06 MED ORDER — POTASSIUM CHLORIDE ER 10 MEQ PO TBCR
20.0000 meq | EXTENDED_RELEASE_TABLET | Freq: Once | ORAL | Status: AC
  Administered 2015-11-06: 20 meq via ORAL
  Filled 2015-11-06 (×2): qty 2

## 2015-11-06 MED ORDER — SODIUM CHLORIDE 0.9% FLUSH: 3.0000 mL | INTRAVENOUS | Status: DC | PRN

## 2015-11-06 NOTE — Progress Notes (Signed)
15 beats of NSVT on telemetry.  Pt states he did feel some "fluttering," but no other symptoms noted.  VSS.  IV magnesium has infused.  Will continue to monitor.  Alonza Bogus

## 2015-11-06 NOTE — Progress Notes (Signed)
ANTICOAGULATION CONSULT NOTE - Initial Consult  Pharmacy Consult for Heparin Indication: NSTEMI  No Known Allergies  Patient Measurements: Height: 5\' 11"  (180.3 cm) Weight: 165 lb 12.8 oz (75.206 kg) IBW/kg (Calculated) : 75.3   Vital Signs: Temp: 97.3 F (36.3 C) (04/23 0804) Temp Source: Oral (04/23 0804) BP: 103/84 mmHg (04/23 0804) Pulse Rate: 73 (04/23 0804)  Labs:  Recent Labs  11/04/15 2248 11/05/15 0130 11/05/15 2248 11/05/15 1033 11/05/15 1056 11/05/15 1213 11/05/15 1750 11/06/15 0609  HGB 12.8*  --   --   --   --   --   --  11.5*  HCT 39.5  --   --   --   --   --   --  35.2*  PLT 240  --   --   --   --   --   --  228  LABPROT 22.6*  --  23.2*  --   --   --   --  21.9*  INR 2.00*  --  2.08*  --   --   --   --  1.92*  HEPARINUNFRC  --   --   --  0.25*  --   --  0.51 0.70  CREATININE 1.60*  --   --   --  1.49*  --   --  1.29*  TROPONINI  --  0.78* 2.48*  --   --  4.80*  --   --     Estimated Creatinine Clearance: 66.4 mL/min (by C-G formula based on Cr of 1.29).   Medical History: Past Medical History  Diagnosis Date  . CHF (congestive heart failure) (HCC)   . Hypertension     Medications:  See electronic med rec  Assessment: 60 y.o. M presents with NSTEMI. Trop up to 0.78>2.48>4.80. Pt on coumadin PTA for LV thrombus. Admit INR 2>2.08>1.92. CBC ok. Pharmacy was consulted to dose heparin and hold coumadin with potential plans for cath. HL this AM is within range at 0.70 on 1250 units/hr. HLs have been therapeutic x 2 so will get daily HL. No issues per RN.  Goal of Therapy:  Heparin level 0.3-0.7 units/ml Monitor platelets by anticoagulation protocol: Yes   Plan:  - Continue heparin infusion at 1250 units/hr - Daily HL, CBC - Monitor s/sx of bleeding, plans for cath  Cassie L. Roseanne Reno, PharmD PGY2 Infectious Diseases Pharmacy Resident Pager: [phone] 11/06/2015 10:42 AM

## 2015-11-06 NOTE — Progress Notes (Signed)
SUBJECTIVE: Denies chest pain. Occasional palpitations. Still difficulty getting a deep breath, but improved since Friday.   ROS: Other than pertinent positives in "Subjective", all others were reviewed and found to be negative.   Intake/Output Summary (Last 24 hours) at 11/06/15 0901 Last data filed at 11/06/15 0800  Gross per 24 hour  Intake 896.76 ml  Output   1525 ml  Net -628.24 ml    Current Facility-Administered Medications  Medication Dose Route Frequency Provider Last Rate Last Dose  . acetaminophen (TYLENOL) tablet 650 mg  650 mg Oral Q4H PRN Azalee Course, MD   650 mg at 11/05/15 2343  . antiseptic oral rinse (CPC / CETYLPYRIDINIUM CHLORIDE 0.05%) solution 7 mL  7 mL Mouth Rinse BID Azalee Course, MD   7 mL at 11/06/15 0837  . aspirin EC tablet 81 mg  81 mg Oral Daily Azalee Course, MD   81 mg at 11/06/15 4818  . atorvastatin (LIPITOR) tablet 80 mg  80 mg Oral q1800 Azalee Course, MD   80 mg at 11/05/15 1709  . heparin ADULT infusion 100 units/mL (25000 units/250 mL)  1,250 Units/hr Intravenous Continuous Cassie L Stewart, RPH 12.5 mL/hr at 11/06/15 0134 1,250 Units/hr at 11/06/15 0134  . metoprolol tartrate (LOPRESSOR) tablet 12.5 mg  12.5 mg Oral Daily Azalee Course, MD   12.5 mg at 11/06/15 0837  . nitroGLYCERIN (NITROSTAT) SL tablet 0.4 mg  0.4 mg Sublingual Q5 Min x 3 PRN Azalee Course, MD      . nitroGLYCERIN 50 mg in dextrose 5 % 250 mL (0.2 mg/mL) infusion  10-200 mcg/min Intravenous Titrated Tomasita Crumble, MD 4.5 mL/hr at 11/05/15 0510 15 mcg/min at 11/05/15 0510  . ondansetron (ZOFRAN) injection 4 mg  4 mg Intravenous Q6H PRN Azalee Course, MD        Filed Vitals:   11/05/15 2056 11/06/15 0005 11/06/15 0450 11/06/15 0804  BP:  96/73 101/89 103/84  Pulse: 86 86 84 73  Temp:  97.7 F (36.5 C) 98 F (36.7 C) 97.3 F (36.3 C)  TempSrc:    Oral  Resp: 14 34 19 21  Height:      Weight:   165 lb 12.8 oz (75.206 kg)   SpO2: 99% 95% 96% 91%     PHYSICAL EXAM General: Pleasant, NAD Psych: Normal affect. Neuro: Alert and oriented X 3. Moves all extremities spontaneously. HEENT: Normal Neck: Supple without bruits, +JVD Lungs: CTAB Heart: Regular rate and rhythm, +S1 +S2, no appreciable m/r/g, JVP at 15cm and w/ +hepatojugular reflex, Trace pitting pretibial edema Abdomen: Soft, mildly distended, NTTP throughout Extremities: No clubbing, cyanosis. DP/PT/Radials 2+ and equal bilaterally.   TELEMETRY: Reviewed telemetry pt in sinus rhythm/sinus tachy with multiple runs of NSVT  LABS: Basic Metabolic Panel:  Recent Labs  56/31/49 1056 11/05/15 2145 11/06/15 0609  NA 138  --  135  K 4.0  --  3.7  CL 105  --  103  CO2 17*  --  21*  GLUCOSE 116*  --  130*  BUN 25*  --  23*  CREATININE 1.49*  --  1.29*  CALCIUM 8.9  --  8.7*  MG  --  1.7  --    Liver Function Tests: No results for input(s): AST, ALT, ALKPHOS, BILITOT, PROT, ALBUMIN in the last 72 hours. No results for input(s): LIPASE, AMYLASE in the last 72 hours. CBC:  Recent Labs  11/04/15 2248 11/06/15 7026  WBC 4.3 4.2  HGB 12.8* 11.5*  HCT 39.5 35.2*  MCV 102.3* 99.7  PLT 240 228   Cardiac Enzymes:  Recent Labs  11/05/15 0130 11/05/15 0638 11/05/15 1213  TROPONINI 0.78* 2.48* 4.80*   BNP: Invalid input(s): POCBNP D-Dimer: No results for input(s): DDIMER in the last 72 hours. Hemoglobin A1C: No results for input(s): HGBA1C in the last 72 hours. Fasting Lipid Panel: No results for input(s): CHOL, HDL, LDLCALC, TRIG, CHOLHDL, LDLDIRECT in the last 72 hours. Thyroid Function Tests: No results for input(s): TSH, T4TOTAL, T3FREE, THYROIDAB in the last 72 hours.  Invalid input(s): FREET3 Anemia Panel: No results for input(s): VITAMINB12, FOLATE, FERRITIN, TIBC, IRON, RETICCTPCT in the last 72 hours.  RADIOLOGY: Dg Chest 2 View  11/04/2015  CLINICAL DATA:  Chest pain and shortness of breath for 1 day EXAM: CHEST  2 VIEW COMPARISON:   None. FINDINGS: Cardiac enlargement without vascular congestion. Focal area of nodular consolidation in the right upper lung probably represents focal pneumonia. Soft tissue lesion is not excluded. Followup PA and lateral chest X-ray is recommended in 3-4 weeks following trial of antibiotic therapy to ensure resolution and exclude underlying malignancy. Left lung is clear. No blunting of costophrenic angles. No pneumothorax. Mediastinal contours appear intact. Degenerative changes in the spine and shoulders. IMPRESSION: Cardiac enlargement. Focal nodular consolidation at the right upper lung likely represent pneumonia. Followup PA and lateral chest X-ray is recommended in 3-4 weeks following trial of antibiotic therapy to ensure resolution and exclude underlying malignancy. Electronically Signed   By: Burman Nieves M.D.   On: 11/04/2015 23:00      ASSESSMENT AND PLAN: ASSESSMENT - NSTEMI (troponins up to 4.8) - Acute on chronic systolic heart failure - Non-ischemic cardiomyopathy - Renal insuffiencey of unknown chronicity (down to 1.29) - macrocytosis - report of LV thrombus, pt on warfarin -focal nodular consolidation in RUL   PLAN The pt has evidence of acute decompensated heart failure (-984 cc in past 24 hrs) with increasing DOE and edema over recent days and elevated JVP. He reports normal coronary angiography during initial w/u for his cardiomyopathy a year and half ago, and he has experienced CP with previous heart failure exacerbations, albeit not as intense as the discomfort he experienced this evening. Will continue to  treat acute systolic heart failure with intravenous diuretics, but also manage for possible acute plaque rupture event with heparin gtt, ASA, and high-intensity statin.Will plan for coronary angiography 4/24. - furosemide  IV x 1 now - heparin gtt, both for NSTEMI management and reported LV thrombus. In this regard, holding home warfarin given need for cath -  Atorvastatin  PO QDAY - ASA  PO QDAY - Metoprolol 12.5mg  PO BID - obtain records from [facility] -given that he is afebrile and absence of leukocytosis, I will not initiate antimicrobial therapy at this time (will need a repeat CXR to assess for interval changes)  Prentice Docker, M.D., F.A.C.C.

## 2015-11-07 ENCOUNTER — Encounter (HOSPITAL_COMMUNITY)
Admission: EM | Disposition: A | Discharge status: Home Health | Payer: Self-pay | Source: Home / Self Care | Attending: Cardiovascular Disease

## 2015-11-07 ENCOUNTER — Encounter (HOSPITAL_COMMUNITY): Payer: Self-pay | Admitting: Cardiology

## 2015-11-07 DIAGNOSIS — I509 Heart failure, unspecified: Secondary | ICD-10-CM

## 2015-11-07 DIAGNOSIS — I513 Intracardiac thrombosis, not elsewhere classified: Secondary | ICD-10-CM | POA: Diagnosis present

## 2015-11-07 DIAGNOSIS — I5043 Acute on chronic combined systolic (congestive) and diastolic (congestive) heart failure: Secondary | ICD-10-CM | POA: Diagnosis present

## 2015-11-07 DIAGNOSIS — I213 ST elevation (STEMI) myocardial infarction of unspecified site: Secondary | ICD-10-CM

## 2015-11-07 HISTORY — PX: CARDIAC CATHETERIZATION: SHX172

## 2015-11-07 LAB — CBC
HCT: 34.6 % — ABNORMAL LOW (ref 39.0–52.0)
Hemoglobin: 11.3 g/dL — ABNORMAL LOW (ref 13.0–17.0)
MCH: 32.7 pg (ref 26.0–34.0)
MCHC: 32.7 g/dL (ref 30.0–36.0)
MCV: 100 fL (ref 78.0–100.0)
PLATELETS: 238 10*3/uL (ref 150–400)
RBC: 3.46 MIL/uL — AB (ref 4.22–5.81)
RDW: 14.1 % (ref 11.5–15.5)
WBC: 4.5 10*3/uL (ref 4.0–10.5)

## 2015-11-07 LAB — BASIC METABOLIC PANEL
Anion gap: 12 (ref 5–15)
BUN: 22 mg/dL — AB (ref 6–20)
CALCIUM: 8.6 mg/dL — AB (ref 8.9–10.3)
CO2: 21 mmol/L — ABNORMAL LOW (ref 22–32)
Chloride: 101 mmol/L (ref 101–111)
Creatinine, Ser: 1.2 mg/dL (ref 0.61–1.24)
GLUCOSE: 126 mg/dL — AB (ref 65–99)
POTASSIUM: 3.7 mmol/L (ref 3.5–5.1)
SODIUM: 134 mmol/L — AB (ref 135–145)

## 2015-11-07 LAB — POCT I-STAT 3, VENOUS BLOOD GAS (G3P V)
ACID-BASE DEFICIT: 1 mmol/L (ref 0.0–2.0)
Acid-base deficit: 2 mmol/L (ref 0.0–2.0)
BICARBONATE: 23.6 meq/L (ref 20.0–24.0)
Bicarbonate: 23.4 mEq/L (ref 20.0–24.0)
O2 Saturation: 34 %
O2 Saturation: 38 %
PCO2 VEN: 39.8 mmHg — AB (ref 45.0–50.0)
PH VEN: 7.377 — AB (ref 7.250–7.300)
PH VEN: 7.378 — AB (ref 7.250–7.300)
PO2 VEN: 21 mmHg — AB (ref 31.0–45.0)
TCO2: 25 mmol/L (ref 0–100)
TCO2: 25 mmol/L (ref 0–100)
pCO2, Ven: 40 mmHg — ABNORMAL LOW (ref 45.0–50.0)
pO2, Ven: 23 mmHg — ABNORMAL LOW (ref 31.0–45.0)

## 2015-11-07 LAB — HEPARIN LEVEL (UNFRACTIONATED): HEPARIN UNFRACTIONATED: 0.63 [IU]/mL (ref 0.30–0.70)

## 2015-11-07 LAB — PROTIME-INR
INR: 1.76 — AB (ref 0.00–1.49)
Prothrombin Time: 20.5 seconds — ABNORMAL HIGH (ref 11.6–15.2)

## 2015-11-07 SURGERY — LEFT HEART CATH AND CORONARY ANGIOGRAPHY
Anesthesia: LOCAL

## 2015-11-07 MED ORDER — WARFARIN SODIUM 2 MG PO TABS
4.0000 mg | ORAL_TABLET | Freq: Once | ORAL | Status: AC
  Administered 2015-11-07: 4 mg via ORAL
  Filled 2015-11-07: qty 1
  Filled 2015-11-07: qty 2

## 2015-11-07 MED ORDER — HEPARIN (PORCINE) IN NACL 100-0.45 UNIT/ML-% IJ SOLN
1450.0000 [IU]/h | INTRAMUSCULAR | Status: DC
  Administered 2015-11-07 – 2015-11-09 (×3): 1200 [IU]/h via INTRAVENOUS
  Administered 2015-11-12: 1300 [IU]/h via INTRAVENOUS
  Filled 2015-11-07 (×8): qty 250

## 2015-11-07 MED ORDER — POTASSIUM CHLORIDE CRYS ER 20 MEQ PO TBCR
40.0000 meq | EXTENDED_RELEASE_TABLET | Freq: Once | ORAL | Status: AC
  Administered 2015-11-07: 40 meq via ORAL
  Filled 2015-11-07: qty 2

## 2015-11-07 MED ORDER — SODIUM CHLORIDE 0.9% FLUSH
3.0000 mL | INTRAVENOUS | Status: DC | PRN
Start: 2015-11-07 — End: 2015-11-18

## 2015-11-07 MED ORDER — SODIUM CHLORIDE 0.9% FLUSH: 3.0000 mL | INTRAVENOUS | Status: DC | PRN

## 2015-11-07 MED ORDER — MIDAZOLAM HCL 2 MG/2ML IJ SOLN
INTRAMUSCULAR | Status: DC | PRN
  Administered 2015-11-07: 1 mg via INTRAVENOUS

## 2015-11-07 MED ORDER — FENTANYL CITRATE (PF) 100 MCG/2ML IJ SOLN
INTRAMUSCULAR | Status: AC
  Filled 2015-11-07: qty 2

## 2015-11-07 MED ORDER — IOPAMIDOL (ISOVUE-370) INJECTION 76%
INTRAVENOUS | Status: DC | PRN
  Administered 2015-11-07: 30 mL via INTRA_ARTERIAL

## 2015-11-07 MED ORDER — VERAPAMIL HCL 2.5 MG/ML IV SOLN
INTRAVENOUS | Status: AC
  Filled 2015-11-07: qty 2

## 2015-11-07 MED ORDER — LIDOCAINE HCL (PF) 1 % IJ SOLN
INTRAMUSCULAR | Status: DC | PRN
  Administered 2015-11-07 (×2): 2 mL

## 2015-11-07 MED ORDER — MILRINONE IN DEXTROSE 20 MG/100ML IV SOLN
0.1250 ug/kg/min | INTRAVENOUS | Status: DC
  Filled 2015-11-07: qty 100

## 2015-11-07 MED ORDER — WARFARIN - PHARMACIST DOSING INPATIENT
Freq: Every day | Status: DC
  Administered 2015-11-07 – 2015-11-09 (×3)

## 2015-11-07 MED ORDER — HEPARIN (PORCINE) IN NACL 2-0.9 UNIT/ML-% IJ SOLN
INTRAMUSCULAR | Status: AC
  Filled 2015-11-07: qty 1000

## 2015-11-07 MED ORDER — FUROSEMIDE 10 MG/ML IJ SOLN
80.0000 mg | Freq: Two times a day (BID) | INTRAMUSCULAR | Status: DC
  Administered 2015-11-07 – 2015-11-09 (×4): 80 mg via INTRAVENOUS
  Filled 2015-11-07 (×4): qty 8

## 2015-11-07 MED ORDER — FUROSEMIDE 10 MG/ML IJ SOLN
80.0000 mg | Freq: Once | INTRAMUSCULAR | Status: AC
  Administered 2015-11-08: 80 mg via INTRAVENOUS
  Filled 2015-11-07: qty 8

## 2015-11-07 MED ORDER — ACETAMINOPHEN 325 MG PO TABS
650.0000 mg | ORAL_TABLET | ORAL | Status: DC | PRN
  Administered 2015-11-09 – 2015-11-10 (×2): 650 mg via ORAL
  Filled 2015-11-07 (×2): qty 2

## 2015-11-07 MED ORDER — HEPARIN (PORCINE) IN NACL 2-0.9 UNIT/ML-% IJ SOLN
INTRAMUSCULAR | Status: DC | PRN
  Administered 2015-11-07: 1000 mL

## 2015-11-07 MED ORDER — MIDAZOLAM HCL 2 MG/2ML IJ SOLN
INTRAMUSCULAR | Status: AC
  Filled 2015-11-07: qty 2

## 2015-11-07 MED ORDER — SODIUM CHLORIDE 0.9% FLUSH
3.0000 mL | Freq: Two times a day (BID) | INTRAVENOUS | Status: DC
  Administered 2015-11-09 – 2015-11-18 (×7): 3 mL via INTRAVENOUS

## 2015-11-07 MED ORDER — VERAPAMIL HCL 2.5 MG/ML IV SOLN
INTRAVENOUS | Status: DC | PRN
  Administered 2015-11-07: 10 mL via INTRA_ARTERIAL

## 2015-11-07 MED ORDER — SODIUM CHLORIDE 0.9% FLUSH
3.0000 mL | Freq: Two times a day (BID) | INTRAVENOUS | Status: DC
  Administered 2015-11-07 – 2015-11-15 (×8): 3 mL via INTRAVENOUS

## 2015-11-07 MED ORDER — MILRINONE IN DEXTROSE 20 MG/100ML IV SOLN
0.2500 ug/kg/min | INTRAVENOUS | Status: AC
  Administered 2015-11-07: 0.25 ug/kg/min via INTRAVENOUS

## 2015-11-07 MED ORDER — FENTANYL CITRATE (PF) 100 MCG/2ML IJ SOLN
INTRAMUSCULAR | Status: DC | PRN
  Administered 2015-11-07: 25 ug via INTRAVENOUS

## 2015-11-07 MED ORDER — SODIUM CHLORIDE 0.9 % IV SOLN: 250.0000 mL | INTRAVENOUS | Status: DC | PRN

## 2015-11-07 MED ORDER — SODIUM CHLORIDE 0.9 % IV SOLN
250.0000 mL | INTRAVENOUS | Status: DC | PRN
  Administered 2015-11-17: 11:00:00 via INTRAVENOUS

## 2015-11-07 MED ORDER — HEPARIN SODIUM (PORCINE) 1000 UNIT/ML IJ SOLN
INTRAMUSCULAR | Status: DC | PRN
  Administered 2015-11-07: 4000 [IU] via INTRAVENOUS

## 2015-11-07 MED ORDER — ONDANSETRON HCL 4 MG/2ML IJ SOLN: 4.0000 mg | Freq: Four times a day (QID) | INTRAMUSCULAR | Status: DC | PRN

## 2015-11-07 MED ORDER — SPIRONOLACTONE 12.5 MG HALF TABLET
12.5000 mg | ORAL_TABLET | Freq: Every day | ORAL | Status: DC
  Administered 2015-11-07 – 2015-11-08 (×2): 12.5 mg via ORAL
  Filled 2015-11-07 (×3): qty 1

## 2015-11-07 MED ORDER — LIDOCAINE HCL (PF) 1 % IJ SOLN
INTRAMUSCULAR | Status: AC
  Filled 2015-11-07: qty 30

## 2015-11-07 SURGICAL SUPPLY — 12 items
CATH BALLN WEDGE 5F 110CM (CATHETERS) ×2 IMPLANT
CATH INFINITI 5 FR JL3.5 (CATHETERS) ×2 IMPLANT
CATH INFINITI 5FR ANG PIGTAIL (CATHETERS) IMPLANT
CATH INFINITI JR4 5F (CATHETERS) ×2 IMPLANT
GLIDESHEATH SLEND SS 6F .021 (SHEATH) ×2 IMPLANT
HOVERMATT SINGLE USE (MISCELLANEOUS) IMPLANT
KIT HEART LEFT (KITS) ×2 IMPLANT
PACK CARDIAC CATHETERIZATION (CUSTOM PROCEDURE TRAY) ×2 IMPLANT
SHEATH FAST CATH BRACH 5F 5CM (SHEATH) ×2 IMPLANT
TRANSDUCER W/STOPCOCK (MISCELLANEOUS) ×2 IMPLANT
TUBING CIL FLEX 10 FLL-RA (TUBING) ×2 IMPLANT
WIRE SAFE-T 1.5MM-J .035X260CM (WIRE) ×2 IMPLANT

## 2015-11-07 NOTE — Progress Notes (Signed)
TR band removed. Gauze and tegaderm placed over site.

## 2015-11-07 NOTE — Progress Notes (Signed)
Patient's mother in. Dr. Rennis Golden in talking w/both.

## 2015-11-07 NOTE — H&P (View-Only) (Signed)
DAILY PROGRESS NOTE  Subjective:  Troponin with typical rise up to 4.8, c/w NSTEMI. Diursed another -650 overnight. Creatinine was 1.6, now down to 1.2. INR 1.76. He has felt worsening fatigue, nausea, exhaustion with minimal exertion and early satiety leading up to this event. Known to have LV thrombus on warfarin - being held for cath. Cardiologist is Dr. Marti Sleigh with [facility].  Objective:  Temp:  [97.5 F (36.4 C)-97.7 F (36.5 C)] 97.6 F (36.4 C) (04/24 0555) Pulse Rate:  [79-98] 98 (04/24 0555) Resp:  [13-22] 16 (04/24 0555) BP: (92-116)/(67-83) 101/83 mmHg (04/24 0555) SpO2:  [96 %-100 %] 99 % (04/24 0555) Weight:  [166 lb 12.8 oz (75.66 kg)] 166 lb 12.8 oz (75.66 kg) (04/24 0555) Weight change: 6.4 oz (0.181 kg)  Intake/Output from previous day: 04/23 0701 - 04/24 0700 In: 1690.6 [P.O.:1260; I.V.:430.6] Out: 650 [Urine:650]  Intake/Output from this shift:    Medications: Current Facility-Administered Medications  Medication Dose Route Frequency Provider Last Rate Last Dose  . 0.9 %  sodium chloride infusion  250 mL Intravenous PRN Brittainy Erie Noe, PA-C      . 0.9 %  sodium chloride infusion   Intravenous Continuous Consuelo Pandy, PA-C 10 mL/hr at 11/07/15 2229    . acetaminophen (TYLENOL) tablet 650 mg  650 mg Oral Q4H PRN Clayborne Dana, MD   650 mg at 11/05/15 2343  . antiseptic oral rinse (CPC / CETYLPYRIDINIUM CHLORIDE 0.05%) solution 7 mL  7 mL Mouth Rinse BID Clayborne Dana, MD   7 mL at 11/06/15 2200  . aspirin EC tablet 81 mg  81 mg Oral Daily Clayborne Dana, MD   81 mg at 11/06/15 0837  . atorvastatin (LIPITOR) tablet 80 mg  80 mg Oral q1800 Clayborne Dana, MD   80 mg at 11/06/15 1651  . heparin ADULT infusion 100 units/mL (25000 units/250 mL)  1,250 Units/hr Intravenous Continuous Cassie L Stewart, RPH 12.5 mL/hr at 11/06/15 2339 1,250 Units/hr at 11/06/15 2339  . metoprolol tartrate (LOPRESSOR) tablet 12.5 mg  12.5 mg Oral  Daily Clayborne Dana, MD   12.5 mg at 11/07/15 0907  . nitroGLYCERIN (NITROSTAT) SL tablet 0.4 mg  0.4 mg Sublingual Q5 Min x 3 PRN Clayborne Dana, MD      . nitroGLYCERIN 50 mg in dextrose 5 % 250 mL (0.2 mg/mL) infusion  10-200 mcg/min Intravenous Titrated Everlene Balls, MD 4.5 mL/hr at 11/07/15 0907 15 mcg/min at 11/07/15 0907  . ondansetron (ZOFRAN) injection 4 mg  4 mg Intravenous Q6H PRN Clayborne Dana, MD      . pneumococcal 23 valent vaccine (PNU-IMMUNE) injection 0.5 mL  0.5 mL Intramuscular Tomorrow-1000 Herminio Commons, MD      . sodium chloride flush (NS) 0.9 % injection 3 mL  3 mL Intravenous Q12H Brittainy M Simmons, PA-C   3 mL at 11/06/15 2200  . sodium chloride flush (NS) 0.9 % injection 3 mL  3 mL Intravenous PRN Brittainy Erie Noe, PA-C        Physical Exam: General appearance: alert and no distress Lungs: clear to auscultation bilaterally Heart: regular rate and rhythm, S1, S2 normal, S3 present and systolic murmur: early systolic 2/6, crescendo at lower left sternal border Extremities: edema 1+ edema bilaterally Neurologic: Grossly normal  Lab Results: Results for orders placed or performed during the hospital encounter of 11/05/15 (from the past 48 hour(s))  Heparin level (unfractionated)  Status: Abnormal   Collection Time: 11/05/15 10:33 AM  Result Value Ref Range   Heparin Unfractionated 0.25 (L) 0.30 - 0.70 IU/mL    Comment:        IF HEPARIN RESULTS ARE BELOW EXPECTED VALUES, AND PATIENT DOSAGE HAS BEEN CONFIRMED, SUGGEST FOLLOW UP TESTING OF ANTITHROMBIN III LEVELS.   Basic metabolic panel     Status: Abnormal   Collection Time: 11/05/15 10:56 AM  Result Value Ref Range   Sodium 138 135 - 145 mmol/L   Potassium 4.0 3.5 - 5.1 mmol/L   Chloride 105 101 - 111 mmol/L   CO2 17 (L) 22 - 32 mmol/L   Glucose, Bld 116 (H) 65 - 99 mg/dL   BUN 25 (H) 6 - 20 mg/dL   Creatinine, Ser 1.49 (H) 0.61 - 1.24 mg/dL   Calcium 8.9 8.9 - 10.3 mg/dL   GFR calc non  Af Amer 50 (L) >60 mL/min   GFR calc Af Amer 58 (L) >60 mL/min    Comment: (NOTE) The eGFR has been calculated using the CKD EPI equation. This calculation has not been validated in all clinical situations. eGFR's persistently <60 mL/min signify possible Chronic Kidney Disease.    Anion gap 16 (H) 5 - 15  Troponin I     Status: Abnormal   Collection Time: 11/05/15 12:13 PM  Result Value Ref Range   Troponin I 4.80 (HH) <0.031 ng/mL    Comment:        POSSIBLE MYOCARDIAL ISCHEMIA. SERIAL TESTING RECOMMENDED. CRITICAL VALUE NOTED.  VALUE IS CONSISTENT WITH PREVIOUSLY REPORTED AND CALLED VALUE.   Heparin level (unfractionated)     Status: None   Collection Time: 11/05/15  5:50 PM  Result Value Ref Range   Heparin Unfractionated 0.51 0.30 - 0.70 IU/mL    Comment:        IF HEPARIN RESULTS ARE BELOW EXPECTED VALUES, AND PATIENT DOSAGE HAS BEEN CONFIRMED, SUGGEST FOLLOW UP TESTING OF ANTITHROMBIN III LEVELS.   Magnesium     Status: None   Collection Time: 11/05/15  9:45 PM  Result Value Ref Range   Magnesium 1.7 1.7 - 2.4 mg/dL  Heparin level (unfractionated)     Status: None   Collection Time: 11/06/15  6:09 AM  Result Value Ref Range   Heparin Unfractionated 0.70 0.30 - 0.70 IU/mL    Comment:        IF HEPARIN RESULTS ARE BELOW EXPECTED VALUES, AND PATIENT DOSAGE HAS BEEN CONFIRMED, SUGGEST FOLLOW UP TESTING OF ANTITHROMBIN III LEVELS.   CBC     Status: Abnormal   Collection Time: 11/06/15  6:09 AM  Result Value Ref Range   WBC 4.2 4.0 - 10.5 K/uL   RBC 3.53 (L) 4.22 - 5.81 MIL/uL   Hemoglobin 11.5 (L) 13.0 - 17.0 g/dL   HCT 35.2 (L) 39.0 - 52.0 %   MCV 99.7 78.0 - 100.0 fL   MCH 32.6 26.0 - 34.0 pg   MCHC 32.7 30.0 - 36.0 g/dL   RDW 13.9 11.5 - 15.5 %   Platelets 228 150 - 400 K/uL  Basic metabolic panel     Status: Abnormal   Collection Time: 11/06/15  6:09 AM  Result Value Ref Range   Sodium 135 135 - 145 mmol/L   Potassium 3.7 3.5 - 5.1 mmol/L   Chloride  103 101 - 111 mmol/L   CO2 21 (L) 22 - 32 mmol/L   Glucose, Bld 130 (H) 65 - 99 mg/dL   BUN  23 (H) 6 - 20 mg/dL   Creatinine, Ser 2.08 (H) 0.61 - 1.24 mg/dL   Calcium 8.7 (L) 8.9 - 10.3 mg/dL   GFR calc non Af Amer 60 (L) >60 mL/min   GFR calc Af Amer >60 >60 mL/min    Comment: (NOTE) The eGFR has been calculated using the CKD EPI equation. This calculation has not been validated in all clinical situations. eGFR's persistently <60 mL/min signify possible Chronic Kidney Disease.    Anion gap 11 5 - 15  Protime-INR     Status: Abnormal   Collection Time: 11/06/15  6:09 AM  Result Value Ref Range   Prothrombin Time 21.9 (H) 11.6 - 15.2 seconds   INR 1.92 (H) 0.00 - 1.49  Magnesium     Status: None   Collection Time: 11/06/15  6:15 PM  Result Value Ref Range   Magnesium 2.2 1.7 - 2.4 mg/dL  Basic metabolic panel     Status: Abnormal   Collection Time: 11/06/15  6:15 PM  Result Value Ref Range   Sodium 137 135 - 145 mmol/L   Potassium 3.8 3.5 - 5.1 mmol/L   Chloride 101 101 - 111 mmol/L   CO2 22 22 - 32 mmol/L   Glucose, Bld 123 (H) 65 - 99 mg/dL   BUN 23 (H) 6 - 20 mg/dL   Creatinine, Ser 0.22 (H) 0.61 - 1.24 mg/dL   Calcium 9.0 8.9 - 33.6 mg/dL   GFR calc non Af Amer 51 (L) >60 mL/min   GFR calc Af Amer 59 (L) >60 mL/min    Comment: (NOTE) The eGFR has been calculated using the CKD EPI equation. This calculation has not been validated in all clinical situations. eGFR's persistently <60 mL/min signify possible Chronic Kidney Disease.    Anion gap 14 5 - 15  Heparin level (unfractionated)     Status: None   Collection Time: 11/07/15  4:36 AM  Result Value Ref Range   Heparin Unfractionated 0.63 0.30 - 0.70 IU/mL    Comment:        IF HEPARIN RESULTS ARE BELOW EXPECTED VALUES, AND PATIENT DOSAGE HAS BEEN CONFIRMED, SUGGEST FOLLOW UP TESTING OF ANTITHROMBIN III LEVELS.   CBC     Status: Abnormal   Collection Time: 11/07/15  4:36 AM  Result Value Ref Range   WBC 4.5  4.0 - 10.5 K/uL   RBC 3.46 (L) 4.22 - 5.81 MIL/uL   Hemoglobin 11.3 (L) 13.0 - 17.0 g/dL   HCT 12.2 (L) 44.9 - 75.3 %   MCV 100.0 78.0 - 100.0 fL   MCH 32.7 26.0 - 34.0 pg   MCHC 32.7 30.0 - 36.0 g/dL   RDW 00.5 11.0 - 21.1 %   Platelets 238 150 - 400 K/uL  Basic metabolic panel     Status: Abnormal   Collection Time: 11/07/15  4:36 AM  Result Value Ref Range   Sodium 134 (L) 135 - 145 mmol/L   Potassium 3.7 3.5 - 5.1 mmol/L   Chloride 101 101 - 111 mmol/L   CO2 21 (L) 22 - 32 mmol/L   Glucose, Bld 126 (H) 65 - 99 mg/dL   BUN 22 (H) 6 - 20 mg/dL   Creatinine, Ser 1.73 0.61 - 1.24 mg/dL   Calcium 8.6 (L) 8.9 - 10.3 mg/dL   GFR calc non Af Amer >60 >60 mL/min   GFR calc Af Amer >60 >60 mL/min    Comment: (NOTE) The eGFR has been calculated using the CKD  EPI equation. This calculation has not been validated in all clinical situations. eGFR's persistently <60 mL/min signify possible Chronic Kidney Disease.    Anion gap 12 5 - 15  Protime-INR     Status: Abnormal   Collection Time: 11/07/15  4:36 AM  Result Value Ref Range   Prothrombin Time 20.5 (H) 11.6 - 15.2 seconds   INR 1.76 (H) 0.00 - 1.49    Imaging: No results found.  Assessment:  Principal Problem:   NSTEMI (non-ST elevated myocardial infarction) ([place]) Active Problems:   Acute on chronic systolic and diastolic heart failure, NYHA class 4 (HCC)   Left ventricular thrombus ([place])   Plan:  1. Discussed catheterization risks/benefits/alternatives with Matthew Vaughan today and he wishes to proceed. Symptoms also concerning for low output heart failure -progressive fatigue, weakness, early satiety - breathing improved mildly with diuresis. Would recommend both left and right heart catheterization today to determine CO and SVO2. He reports a LHC about 2 years ago, being told he had "no blockages", however, the current trend of troponin is more consistent with NSTEMI/ACS. Currently on aspirin, metoprolol (which he called the  "dead" medicine, because it "drains him", heparin and atorvastatin. If renal function remains stable, may be a candidate for Entresto prior to d/c.   Time Spent Directly with Patient:  15 minutes  Length of Stay:  LOS: 2 days   Pixie Casino, MD, [facility] Attending Cardiologist [place] 11/07/2015, 9:31 AM

## 2015-11-07 NOTE — Progress Notes (Signed)
ANTICOAGULATION CONSULT NOTE   Pharmacy Consult for Heparin - resume 8 hrs after sheath out, Coumadin Indication: NSTEMI  No Known Allergies  Patient Measurements: Height: 5\' 11"  (180.3 cm) Weight: 166 lb 12.8 oz (75.66 kg) IBW/kg (Calculated) : 75.3   Vital Signs: Temp: 97.6 F (36.4 C) (04/24 0555) Temp Source: Oral (04/24 0555) BP: 102/78 mmHg (04/24 1500) Pulse Rate: 91 (04/24 1500)  Labs:  Recent Labs  11/04/15 2248 11/05/15 0130 11/05/15 0638  11/05/15 1213 11/05/15 1750 11/06/15 0609 11/06/15 1815 11/07/15 0436  HGB 12.8*  --   --   --   --   --  11.5*  --  11.3*  HCT 39.5  --   --   --   --   --  35.2*  --  34.6*  PLT 240  --   --   --   --   --  228  --  238  LABPROT 22.6*  --  23.2*  --   --   --  21.9*  --  20.5*  INR 2.00*  --  2.08*  --   --   --  1.92*  --  1.76*  HEPARINUNFRC  --   --   --   < >  --  0.51 0.70  --  0.63  CREATININE 1.60*  --   --   < >  --   --  1.29* 1.46* 1.20  TROPONINI  --  0.78* 2.48*  --  4.80*  --   --   --   --   < > = values in this interval not displayed.  Estimated Creatinine Clearance: 71.5 mL/min (by C-G formula based on Cr of 1.2).   Medical History: Past Medical History  Diagnosis Date  . CHF (congestive heart failure) (HCC)   . Hypertension     Medications:  See electronic med rec  Assessment: 60 y.o. M presents with NSTEMI. Trop up to 0.78>2.48>4.80. Pt on coumadin PTA for LV thrombus. Admit INR 2>2.08>1.92. CBC ok. Pharmacy was consulted to dose heparin and hold coumadin with potential plans for cath. HL this AM is within range at 0.63 on 1250 units/hr. Heparin turned off for cath lab, pharmacy asked to resume this evening, 8 hrs after sheath pull.  Sheath was pulled at 1145 AM.  Also  with LV thrombus.  On chronic Coumadin pta 2.5 mg daily  Goal of Therapy:  Heparin level 0.3-0.7 units/ml  INR 2-3 Monitor platelets by anticoagulation protocol: Yes   Plan:  - Resume IV heparin at 1200 units/hr at 2000  PM. - Check heparin level 6 hrs after gtt restarted - Daily HL, CBC, INR - Monitor s/sx of bleeding, plans for cath - Start Coumadin 4 mg x 1 tonight.  Tad Moore, BCPS  Clinical Pharmacist Pager [phone]  11/07/2015 3:34 PM

## 2015-11-07 NOTE — Progress Notes (Signed)
DAILY PROGRESS NOTE  Subjective:  Troponin with typical rise up to 4.8, c/w NSTEMI. Diursed another -650 overnight. Creatinine was 1.6, now down to 1.2. INR 1.76. He has felt worsening fatigue, nausea, exhaustion with minimal exertion and early satiety leading up to this event. Known to have LV thrombus on warfarin - being held for cath. Cardiologist is Dr. Marti Sleigh with [facility].  Objective:  Temp:  [97.5 F (36.4 C)-97.7 F (36.5 C)] 97.6 F (36.4 C) (04/24 0555) Pulse Rate:  [79-98] 98 (04/24 0555) Resp:  [13-22] 16 (04/24 0555) BP: (92-116)/(67-83) 101/83 mmHg (04/24 0555) SpO2:  [96 %-100 %] 99 % (04/24 0555) Weight:  [166 lb 12.8 oz (75.66 kg)] 166 lb 12.8 oz (75.66 kg) (04/24 0555) Weight change: 6.4 oz (0.181 kg)  Intake/Output from previous day: 04/23 0701 - 04/24 0700 In: 1690.6 [P.O.:1260; I.V.:430.6] Out: 650 [Urine:650]  Intake/Output from this shift:    Medications: Current Facility-Administered Medications  Medication Dose Route Frequency Provider Last Rate Last Dose  . 0.9 %  sodium chloride infusion  250 mL Intravenous PRN Brittainy Erie Noe, PA-C      . 0.9 %  sodium chloride infusion   Intravenous Continuous Consuelo Pandy, PA-C 10 mL/hr at 11/07/15 7564    . acetaminophen (TYLENOL) tablet 650 mg  650 mg Oral Q4H PRN Clayborne Dana, MD   650 mg at 11/05/15 2343  . antiseptic oral rinse (CPC / CETYLPYRIDINIUM CHLORIDE 0.05%) solution 7 mL  7 mL Mouth Rinse BID Clayborne Dana, MD   7 mL at 11/06/15 2200  . aspirin EC tablet 81 mg  81 mg Oral Daily Clayborne Dana, MD   81 mg at 11/06/15 0837  . atorvastatin (LIPITOR) tablet 80 mg  80 mg Oral q1800 Clayborne Dana, MD   80 mg at 11/06/15 1651  . heparin ADULT infusion 100 units/mL (25000 units/250 mL)  1,250 Units/hr Intravenous Continuous Cassie L Stewart, RPH 12.5 mL/hr at 11/06/15 2339 1,250 Units/hr at 11/06/15 2339  . metoprolol tartrate (LOPRESSOR) tablet 12.5 mg  12.5 mg Oral  Daily Clayborne Dana, MD   12.5 mg at 11/07/15 0907  . nitroGLYCERIN (NITROSTAT) SL tablet 0.4 mg  0.4 mg Sublingual Q5 Min x 3 PRN Clayborne Dana, MD      . nitroGLYCERIN 50 mg in dextrose 5 % 250 mL (0.2 mg/mL) infusion  10-200 mcg/min Intravenous Titrated Everlene Balls, MD 4.5 mL/hr at 11/07/15 0907 15 mcg/min at 11/07/15 0907  . ondansetron (ZOFRAN) injection 4 mg  4 mg Intravenous Q6H PRN Clayborne Dana, MD      . pneumococcal 23 valent vaccine (PNU-IMMUNE) injection 0.5 mL  0.5 mL Intramuscular Tomorrow-1000 Herminio Commons, MD      . sodium chloride flush (NS) 0.9 % injection 3 mL  3 mL Intravenous Q12H Brittainy M Simmons, PA-C   3 mL at 11/06/15 2200  . sodium chloride flush (NS) 0.9 % injection 3 mL  3 mL Intravenous PRN Brittainy Erie Noe, PA-C        Physical Exam: General appearance: alert and no distress Lungs: clear to auscultation bilaterally Heart: regular rate and rhythm, S1, S2 normal, S3 present and systolic murmur: early systolic 2/6, crescendo at lower left sternal border Extremities: edema 1+ edema bilaterally Neurologic: Grossly normal  Lab Results: Results for orders placed or performed during the hospital encounter of 11/05/15 (from the past 48 hour(s))  Heparin level (unfractionated)  Status: Abnormal   Collection Time: 11/05/15 10:33 AM  Result Value Ref Range   Heparin Unfractionated 0.25 (L) 0.30 - 0.70 IU/mL    Comment:        IF HEPARIN RESULTS ARE BELOW EXPECTED VALUES, AND PATIENT DOSAGE HAS BEEN CONFIRMED, SUGGEST FOLLOW UP TESTING OF ANTITHROMBIN III LEVELS.   Basic metabolic panel     Status: Abnormal   Collection Time: 11/05/15 10:56 AM  Result Value Ref Range   Sodium 138 135 - 145 mmol/L   Potassium 4.0 3.5 - 5.1 mmol/L   Chloride 105 101 - 111 mmol/L   CO2 17 (L) 22 - 32 mmol/L   Glucose, Bld 116 (H) 65 - 99 mg/dL   BUN 25 (H) 6 - 20 mg/dL   Creatinine, Ser 1.49 (H) 0.61 - 1.24 mg/dL   Calcium 8.9 8.9 - 10.3 mg/dL   GFR calc non  Af Amer 50 (L) >60 mL/min   GFR calc Af Amer 58 (L) >60 mL/min    Comment: (NOTE) The eGFR has been calculated using the CKD EPI equation. This calculation has not been validated in all clinical situations. eGFR's persistently <60 mL/min signify possible Chronic Kidney Disease.    Anion gap 16 (H) 5 - 15  Troponin I     Status: Abnormal   Collection Time: 11/05/15 12:13 PM  Result Value Ref Range   Troponin I 4.80 (HH) <0.031 ng/mL    Comment:        POSSIBLE MYOCARDIAL ISCHEMIA. SERIAL TESTING RECOMMENDED. CRITICAL VALUE NOTED.  VALUE IS CONSISTENT WITH PREVIOUSLY REPORTED AND CALLED VALUE.   Heparin level (unfractionated)     Status: None   Collection Time: 11/05/15  5:50 PM  Result Value Ref Range   Heparin Unfractionated 0.51 0.30 - 0.70 IU/mL    Comment:        IF HEPARIN RESULTS ARE BELOW EXPECTED VALUES, AND PATIENT DOSAGE HAS BEEN CONFIRMED, SUGGEST FOLLOW UP TESTING OF ANTITHROMBIN III LEVELS.   Magnesium     Status: None   Collection Time: 11/05/15  9:45 PM  Result Value Ref Range   Magnesium 1.7 1.7 - 2.4 mg/dL  Heparin level (unfractionated)     Status: None   Collection Time: 11/06/15  6:09 AM  Result Value Ref Range   Heparin Unfractionated 0.70 0.30 - 0.70 IU/mL    Comment:        IF HEPARIN RESULTS ARE BELOW EXPECTED VALUES, AND PATIENT DOSAGE HAS BEEN CONFIRMED, SUGGEST FOLLOW UP TESTING OF ANTITHROMBIN III LEVELS.   CBC     Status: Abnormal   Collection Time: 11/06/15  6:09 AM  Result Value Ref Range   WBC 4.2 4.0 - 10.5 K/uL   RBC 3.53 (L) 4.22 - 5.81 MIL/uL   Hemoglobin 11.5 (L) 13.0 - 17.0 g/dL   HCT 35.2 (L) 39.0 - 52.0 %   MCV 99.7 78.0 - 100.0 fL   MCH 32.6 26.0 - 34.0 pg   MCHC 32.7 30.0 - 36.0 g/dL   RDW 13.9 11.5 - 15.5 %   Platelets 228 150 - 400 K/uL  Basic metabolic panel     Status: Abnormal   Collection Time: 11/06/15  6:09 AM  Result Value Ref Range   Sodium 135 135 - 145 mmol/L   Potassium 3.7 3.5 - 5.1 mmol/L   Chloride  103 101 - 111 mmol/L   CO2 21 (L) 22 - 32 mmol/L   Glucose, Bld 130 (H) 65 - 99 mg/dL   BUN  23 (H) 6 - 20 mg/dL   Creatinine, Ser 1.29 (H) 0.61 - 1.24 mg/dL   Calcium 8.7 (L) 8.9 - 10.3 mg/dL   GFR calc non Af Amer 60 (L) >60 mL/min   GFR calc Af Amer >60 >60 mL/min    Comment: (NOTE) The eGFR has been calculated using the CKD EPI equation. This calculation has not been validated in all clinical situations. eGFR's persistently <60 mL/min signify possible Chronic Kidney Disease.    Anion gap 11 5 - 15  Protime-INR     Status: Abnormal   Collection Time: 11/06/15  6:09 AM  Result Value Ref Range   Prothrombin Time 21.9 (H) 11.6 - 15.2 seconds   INR 1.92 (H) 0.00 - 1.49  Magnesium     Status: None   Collection Time: 11/06/15  6:15 PM  Result Value Ref Range   Magnesium 2.2 1.7 - 2.4 mg/dL  Basic metabolic panel     Status: Abnormal   Collection Time: 11/06/15  6:15 PM  Result Value Ref Range   Sodium 137 135 - 145 mmol/L   Potassium 3.8 3.5 - 5.1 mmol/L   Chloride 101 101 - 111 mmol/L   CO2 22 22 - 32 mmol/L   Glucose, Bld 123 (H) 65 - 99 mg/dL   BUN 23 (H) 6 - 20 mg/dL   Creatinine, Ser 1.46 (H) 0.61 - 1.24 mg/dL   Calcium 9.0 8.9 - 10.3 mg/dL   GFR calc non Af Amer 51 (L) >60 mL/min   GFR calc Af Amer 59 (L) >60 mL/min    Comment: (NOTE) The eGFR has been calculated using the CKD EPI equation. This calculation has not been validated in all clinical situations. eGFR's persistently <60 mL/min signify possible Chronic Kidney Disease.    Anion gap 14 5 - 15  Heparin level (unfractionated)     Status: None   Collection Time: 11/07/15  4:36 AM  Result Value Ref Range   Heparin Unfractionated 0.63 0.30 - 0.70 IU/mL    Comment:        IF HEPARIN RESULTS ARE BELOW EXPECTED VALUES, AND PATIENT DOSAGE HAS BEEN CONFIRMED, SUGGEST FOLLOW UP TESTING OF ANTITHROMBIN III LEVELS.   CBC     Status: Abnormal   Collection Time: 11/07/15  4:36 AM  Result Value Ref Range   WBC 4.5  4.0 - 10.5 K/uL   RBC 3.46 (L) 4.22 - 5.81 MIL/uL   Hemoglobin 11.3 (L) 13.0 - 17.0 g/dL   HCT 34.6 (L) 39.0 - 52.0 %   MCV 100.0 78.0 - 100.0 fL   MCH 32.7 26.0 - 34.0 pg   MCHC 32.7 30.0 - 36.0 g/dL   RDW 14.1 11.5 - 15.5 %   Platelets 238 150 - 400 K/uL  Basic metabolic panel     Status: Abnormal   Collection Time: 11/07/15  4:36 AM  Result Value Ref Range   Sodium 134 (L) 135 - 145 mmol/L   Potassium 3.7 3.5 - 5.1 mmol/L   Chloride 101 101 - 111 mmol/L   CO2 21 (L) 22 - 32 mmol/L   Glucose, Bld 126 (H) 65 - 99 mg/dL   BUN 22 (H) 6 - 20 mg/dL   Creatinine, Ser 1.20 0.61 - 1.24 mg/dL   Calcium 8.6 (L) 8.9 - 10.3 mg/dL   GFR calc non Af Amer >60 >60 mL/min   GFR calc Af Amer >60 >60 mL/min    Comment: (NOTE) The eGFR has been calculated using the CKD  EPI equation. This calculation has not been validated in all clinical situations. eGFR's persistently <60 mL/min signify possible Chronic Kidney Disease.    Anion gap 12 5 - 15  Protime-INR     Status: Abnormal   Collection Time: 11/07/15  4:36 AM  Result Value Ref Range   Prothrombin Time 20.5 (H) 11.6 - 15.2 seconds   INR 1.76 (H) 0.00 - 1.49    Imaging: No results found.  Assessment:  Principal Problem:   NSTEMI (non-ST elevated myocardial infarction) ([place]) Active Problems:   Acute on chronic systolic and diastolic heart failure, NYHA class 4 (HCC)   Left ventricular thrombus ([place])   Plan:  1. Discussed catheterization risks/benefits/alternatives with Matthew Vaughan today and he wishes to proceed. Symptoms also concerning for low output heart failure -progressive fatigue, weakness, early satiety - breathing improved mildly with diuresis. Would recommend both left and right heart catheterization today to determine CO and SVO2. He reports a LHC about 2 years ago, being told he had "no blockages", however, the current trend of troponin is more consistent with NSTEMI/ACS. Currently on aspirin, metoprolol (which he called the  "dead" medicine, because it "drains him", heparin and atorvastatin. If renal function remains stable, may be a candidate for Entresto prior to d/c.   Time Spent Directly with Patient:  15 minutes  Length of Stay:  LOS: 2 days   Pixie Casino, MD, [facility] Attending Cardiologist [place] 11/07/2015, 9:31 AM

## 2015-11-07 NOTE — Interval H&P Note (Signed)
History and Physical Interval Note:  11/07/2015 11:14 AM  Matthew Vaughan  has presented today for surgery, with the diagnosis of unstable angina  The various methods of treatment have been discussed with the patient and family. After consideration of risks, benefits and other options for treatment, the patient has consented to  Procedure(s): Left Heart Cath and Coronary Angiography (N/A) as a surgical intervention .  The patient's history has been reviewed, patient examined, no change in status, stable for surgery.  I have reviewed the patient's chart and labs.  Questions were answered to the patient's satisfaction.     Dalton [employer]

## 2015-11-08 ENCOUNTER — Other Ambulatory Visit (HOSPITAL_COMMUNITY): Payer: Self-pay

## 2015-11-08 ENCOUNTER — Encounter (HOSPITAL_COMMUNITY): Payer: Self-pay | Admitting: *Deleted

## 2015-11-08 ENCOUNTER — Inpatient Hospital Stay (HOSPITAL_COMMUNITY): Payer: Medicaid Other

## 2015-11-08 DIAGNOSIS — I429 Cardiomyopathy, unspecified: Secondary | ICD-10-CM

## 2015-11-08 LAB — BASIC METABOLIC PANEL
ANION GAP: 11 (ref 5–15)
Anion gap: 9 (ref 5–15)
BUN: 22 mg/dL — ABNORMAL HIGH (ref 6–20)
BUN: 18 mg/dL (ref 6–20)
CALCIUM: 8.8 mg/dL — AB (ref 8.9–10.3)
CALCIUM: 7.9 mg/dL — AB (ref 8.9–10.3)
CO2: 21 mmol/L — AB (ref 22–32)
CO2: 27 mmol/L (ref 22–32)
CREATININE: 1.32 mg/dL — AB (ref 0.61–1.24)
CREATININE: 1.36 mg/dL — AB (ref 0.61–1.24)
Chloride: 102 mmol/L (ref 101–111)
Chloride: 102 mmol/L (ref 101–111)
GFR calc Af Amer: >60 mL/min (ref >60)
GFR calc non Af Amer: 58 mL/min — ABNORMAL LOW (ref >60)
GFR, EST NON AFRICAN AMERICAN: 56 mL/min — AB (ref >60)
Glucose, Bld: 140 mg/dL — ABNORMAL HIGH (ref 65–99)
Glucose, Bld: 118 mg/dL — ABNORMAL HIGH (ref 65–99)
Potassium: 3.9 mmol/L (ref 3.5–5.1)
Potassium: 3.5 mmol/L (ref 3.5–5.1)
Sodium: 134 mmol/L — ABNORMAL LOW (ref 135–145)
Sodium: 138 mmol/L (ref 135–145)

## 2015-11-08 LAB — HEPATIC FUNCTION PANEL
ALBUMIN: 2.7 g/dL — AB (ref 3.5–5.0)
ALT: 34 U/L (ref 17–63)
AST: 42 U/L — AB (ref 15–41)
Alkaline Phosphatase: 115 U/L (ref 38–126)
BILIRUBIN DIRECT: 0.6 mg/dL — AB (ref 0.1–0.5)
Indirect Bilirubin: 1.9 mg/dL — ABNORMAL HIGH (ref 0.3–0.9)
Total Bilirubin: 2.5 mg/dL — ABNORMAL HIGH (ref 0.3–1.2)
Total Protein: 7.4 g/dL (ref 6.5–8.1)

## 2015-11-08 LAB — CARBOXYHEMOGLOBIN
CARBOXYHEMOGLOBIN: 1.8 % — AB (ref 0.5–1.5)
Methemoglobin: 0.7 % (ref 0.0–1.5)
O2 SAT: 56.5 %
Total hemoglobin: 12.5 g/dL — ABNORMAL LOW (ref 13.5–18.0)

## 2015-11-08 LAB — CBC
HCT: 36.8 % — ABNORMAL LOW (ref 39.0–52.0)
Hemoglobin: 12.1 g/dL — ABNORMAL LOW (ref 13.0–17.0)
MCH: 32.8 pg (ref 26.0–34.0)
MCHC: 32.9 g/dL (ref 30.0–36.0)
MCV: 99.7 fL (ref 78.0–100.0)
PLATELETS: 224 10*3/uL (ref 150–400)
RBC: 3.69 MIL/uL — ABNORMAL LOW (ref 4.22–5.81)
RDW: 14.1 % (ref 11.5–15.5)
WBC: 4.9 10*3/uL (ref 4.0–10.5)

## 2015-11-08 LAB — MAGNESIUM: Magnesium: 1.4 mg/dL — ABNORMAL LOW (ref 1.7–2.4)

## 2015-11-08 LAB — HEPARIN LEVEL (UNFRACTIONATED)
HEPARIN UNFRACTIONATED: 0.21 [IU]/mL — AB (ref 0.30–0.70)
HEPARIN UNFRACTIONATED: 0.49 [IU]/mL (ref 0.30–0.70)

## 2015-11-08 MED ORDER — GADOBENATE DIMEGLUMINE 529 MG/ML IV SOLN
25.0000 mL | Freq: Once | INTRAVENOUS | Status: AC | PRN
  Administered 2015-11-08: 25 mL via INTRAVENOUS

## 2015-11-08 MED ORDER — DIGOXIN 125 MCG PO TABS
0.1250 mg | ORAL_TABLET | Freq: Every day | ORAL | Status: DC
  Administered 2015-11-08 – 2015-12-04 (×25): 0.125 mg via ORAL
  Filled 2015-11-08 (×25): qty 1

## 2015-11-08 MED ORDER — MAGNESIUM SULFATE 2 GM/50ML IV SOLN
2.0000 g | Freq: Once | INTRAVENOUS | Status: AC
  Administered 2015-11-08: 2 g via INTRAVENOUS
  Filled 2015-11-08: qty 50

## 2015-11-08 MED ORDER — WARFARIN SODIUM 5 MG PO TABS
5.0000 mg | ORAL_TABLET | Freq: Once | ORAL | Status: AC
  Administered 2015-11-08: 5 mg via ORAL
  Filled 2015-11-08: qty 1

## 2015-11-08 MED ORDER — SODIUM CHLORIDE 0.9% FLUSH: 10.0000 mL | INTRAVENOUS | Status: DC | PRN

## 2015-11-08 MED ORDER — MILRINONE IN DEXTROSE 20 MG/100ML IV SOLN
0.2500 ug/kg/min | INTRAVENOUS | Status: DC
  Administered 2015-11-08 (×2): 0.25 ug/kg/min via INTRAVENOUS
  Filled 2015-11-08 (×3): qty 100

## 2015-11-08 MED ORDER — SODIUM CHLORIDE 0.9% FLUSH
10.0000 mL | Freq: Two times a day (BID) | INTRAVENOUS | Status: DC
  Administered 2015-11-08 – 2015-11-10 (×4): 10 mL
  Administered 2015-11-11: 20 mL
  Administered 2015-11-12 – 2015-11-20 (×8): 10 mL

## 2015-11-08 MED ORDER — LISINOPRIL 2.5 MG PO TABS
2.5000 mg | ORAL_TABLET | Freq: Every day | ORAL | Status: DC
  Administered 2015-11-08 – 2015-11-09 (×2): 2.5 mg via ORAL
  Filled 2015-11-08 (×2): qty 1

## 2015-11-08 NOTE — Progress Notes (Signed)
ANTICOAGULATION CONSULT NOTE - Follow Up Consult  Pharmacy Consult for heparin Indication: LV thrombus  Labs:  Recent Labs  11/05/15 0638  11/05/15 1213  11/06/15 0609 11/06/15 1815 11/07/15 0436 11/08/15 0256  HGB  --   --   --   < > 11.5*  --  11.3* 12.1*  HCT  --   --   --   --  35.2*  --  34.6* 36.8*  PLT  --   --   --   --  228  --  238 224  LABPROT 23.2*  --   --   --  21.9*  --  20.5*  --   INR 2.08*  --   --   --  1.92*  --  1.76*  --   HEPARINUNFRC  --   < >  --   < > 0.70  --  0.63 0.21*  CREATININE  --   < >  --   --  1.29* 1.46* 1.20  --   TROPONINI 2.48*  --  4.80*  --   --   --   --   --   < > = values in this interval not displayed.   Assessment/lan:  58 male subtherapeutic on heparin after resumed post cath though was previously at higher end of goal at just 50 units/hr higher rate, may need more time to accumulate. Will continue gtt at current rate for now and check additional level.   Vernard Gambles, PharmD, BCPS  11/08/2015,4:00 AM

## 2015-11-08 NOTE — Progress Notes (Signed)
ANTICOAGULATION CONSULT NOTE - Follow Up Consult  Pharmacy Consult for Heparin and Coumadin Indication: LV thrombus  No Known Allergies  Patient Measurements: Height: 5\' 11"  (180.3 cm) Weight: 162 lb 1.6 oz (73.528 kg) IBW/kg (Calculated) : 75.3  Vital Signs: Temp: 97.7 F (36.5 C) (04/25 0751) Temp Source: Oral (04/25 0751) BP: 109/83 mmHg (04/25 0800) Pulse Rate: 85 (04/25 0800)  Labs:  Recent Labs  11/05/15 1213  11/06/15 0609 11/06/15 1815 11/07/15 0436 11/08/15 0256 11/08/15 0940  HGB  --   < > 11.5*  --  11.3* 12.1*  --   HCT  --   --  35.2*  --  34.6* 36.8*  --   PLT  --   --  228  --  238 224  --   LABPROT  --   --  21.9*  --  20.5*  --   --   INR  --   --  1.92*  --  1.76*  --   --   HEPARINUNFRC  --   < > 0.70  --  0.63 0.21* 0.49  CREATININE  --   < > 1.29* 1.46* 1.20 1.32*  --   TROPONINI 4.80*  --   --   --   --   --   --   < > = values in this interval not displayed.  Estimated Creatinine Clearance: 63.4 mL/min (by C-G formula based on Cr of 1.32).   Medications:  Heparin @ 1200 units/hr  Assessment: 60yom resumed on coumadin with heparin bridge after cath yesterday for his LV thrombus. Heparin level is now therapeutic at 0.49. INR is below goal and trending down 1.92 > 1.76. CBC stable. No bleeding reported.  Goal of Therapy:  INR 2-3 Heparin level 0.3-0.7 units/ml Monitor platelets by anticoagulation protocol: Yes   Plan:  1) Continue heparin at 1200 units/hr 2) Increase coumadin to 5mg  x 1 3) Daily heparin level, INR, CBC  Fredrik Rigger 11/08/2015,11:30 AM

## 2015-11-08 NOTE — Progress Notes (Signed)
Peripherally Inserted Central Catheter/Midline Placement  The IV Nurse has discussed with the patient and/or persons authorized to consent for the patient, the purpose of this procedure and the potential benefits and risks involved with this procedure.  The benefits include less needle sticks, lab draws from the catheter and patient may be discharged home with the catheter.  Risks include, but not limited to, infection, bleeding, blood clot (thrombus formation), and puncture of an artery; nerve damage and irregular heat beat.  Alternatives to this procedure were also discussed.  PICC/Midline Placement Documentation        Matthew Vaughan, [name] 11/08/2015, 8:55 AM

## 2015-11-08 NOTE — Progress Notes (Signed)
CSW referred to meet with patient and mother to arrange time for LVAD Assessment. CSW introduced self and explained assessment process and will meet with patient and mother tomorrow morning at 10am to complete LVAD Assessment. Lasandra Beech, LCSW [phone]

## 2015-11-08 NOTE — Progress Notes (Signed)
Pt came down to MRI and was on pumps with medications.  There was no enough fluid in the pumps to be able to convert them to our MRI compatible pump.  RN was contacted and asked her to bring additional med bags and convert over to our pump.  She inquired as to the length of the exam and was told .  We were then instructed to suspend the IV medications for the duration of the exam and she would resume them after pt reached the floor.

## 2015-11-08 NOTE — Progress Notes (Addendum)
Patient ID: Matthew Vaughan, male   DOB: 1956-01-07, 60 y.o.   MRN: [id number]   SUBJECTIVE: Feeling better on milrinone today.  Breathing improved.  Diuresed well.  Weight down 4 lbs.  PICC not in yet.  LHC/RHC (11/07/15) Left Main  No significant coronary disease.      Left Anterior Descending  No significant coronary disease.     Ramus Intermedius  Moderate vessel, no significant coronary disease.     Left Circumflex  No significant coronary disease.     Right Coronary Artery  No significant coronary disease.      Wall Motion    Not done, possible LV thrombus.   RHC Procedural Findings (mmHg): Hemodynamics RA mean 22 RV 56/25 PA 57/34, mean 25 PCWP mean 35 AO 83/68  Oxygen saturations: PA 36% AO 100%  Cardiac Output (Fick) 2.72  Cardiac Index (Fick) 1.39  PVR 2.6 WU          Filed Vitals:   11/07/15 2351 11/08/15 0000 11/08/15 0439 11/08/15 0751  BP: 97/78  Pulse: 88 94 96 80  Temp: 98.1 F (36.7 C)  98.1 F (36.7 C) 97.7 F (36.5 C)  TempSrc: Oral  Oral Oral  Resp:  Height:      Weight:   162 lb 1.6 oz (73.528 kg)   SpO2: 98% 91% 96% 99%    Intake/Output Summary (Last 24 hours) at 11/08/15 0825 Last data filed at 11/08/15 0800  Gross per 24 hour  Intake 305.47 ml  Output   4280 ml  Net -3974.53 ml    LABS: Basic Metabolic Panel:  Recent Labs  40/98/11 2145  11/06/15 1815 11/07/15 0436 11/08/15 0256  NA  --   < > 137 134* 134*  K  --   < > 3.8 3.7 3.9  CL  --   < > 101 101 102  CO2  --   < > 22 21* 21*  GLUCOSE  --   < > 123* 126* 140*  BUN  --   < > 23* 22* 22*  CREATININE  --   < > 1.46* 1.20 1.32*  CALCIUM  --   < > 9.0 8.6* 8.8*  MG 1.7  --  2.2  --   --   < > = values in this interval not displayed. Liver Function Tests: No results for input(s): AST, ALT, ALKPHOS, BILITOT, PROT, ALBUMIN in the last 72 hours. No results for input(s): LIPASE, AMYLASE in the last 72 hours. CBC:  Recent  Labs  11/07/15 0436 11/08/15 0256  WBC 4.5 4.9  HGB 11.3* 12.1*  HCT 34.6* 36.8*  MCV 100.0 99.7  PLT 238 224   Cardiac Enzymes:  Recent Labs  11/05/15 1213  TROPONINI 4.80*   BNP: Invalid input(s): POCBNP D-Dimer: No results for input(s): DDIMER in the last 72 hours. Hemoglobin A1C: No results for input(s): HGBA1C in the last 72 hours. Fasting Lipid Panel: No results for input(s): CHOL, HDL, LDLCALC, TRIG, CHOLHDL, LDLDIRECT in the last 72 hours. Thyroid Function Tests: No results for input(s): TSH, T4TOTAL, T3FREE, THYROIDAB in the last 72 hours.  Invalid input(s): FREET3 Anemia Panel: No results for input(s): VITAMINB12, FOLATE, FERRITIN, TIBC, IRON, RETICCTPCT in the last 72 hours.  RADIOLOGY: Dg Chest 2 View  11/04/2015  CLINICAL DATA:  Chest pain and shortness of breath for 1 day EXAM: CHEST  2 VIEW COMPARISON:  None. FINDINGS: Cardiac enlargement without vascular congestion. Focal area of nodular  consolidation in the right upper lung probably represents focal pneumonia. Soft tissue lesion is not excluded. Followup PA and lateral chest X-ray is recommended in 3-4 weeks following trial of antibiotic therapy to ensure resolution and exclude underlying malignancy. Left lung is clear. No blunting of costophrenic angles. No pneumothorax. Mediastinal contours appear intact. Degenerative changes in the spine and shoulders. IMPRESSION: Cardiac enlargement. Focal nodular consolidation at the right upper lung likely represent pneumonia. Followup PA and lateral chest X-ray is recommended in 3-4 weeks following trial of antibiotic therapy to ensure resolution and exclude underlying malignancy. Electronically Signed   By: Burman Nieves M.D.   On: 11/04/2015 23:00    PHYSICAL EXAM General: NAD Neck: JVP 12 cm, no thyromegaly or thyroid nodule.  Lungs: Clear to auscultation bilaterally with normal respiratory effort. CV: Lateral PMI.  Heart regular S1/S2, no S3/S4, no murmur.  1+  edema 1/3 up lower legs bilaterally.   Abdomen: Soft, nontender, no hepatosplenomegaly, no distention.  Neurologic: Alert and oriented x 3.  Psych: Normal affect. Extremities: No clubbing or cyanosis.   TELEMETRY: Reviewed telemetry pt in NSR  ASSESSMENT AND PLAN: 60 yo with history of nonischemic cardiomyopathy and apparently a prior LV thrombus presented with acute/chronic systolic CHF. He is followed by a cardiologist in [place].  1. Acute/chronic systolic CHF: Nonischemic cardiomyopathy.  Cause uncertain => not a heavy drinker, had one sister with what sounds like sudden death.  Possible viral myocarditis. EF has been very low per his report, do not have echo done yet here.  Had chest pain and elevated TnI at admission, so had LHC/RHC 4/24.  No coronary disease noted, no evidence for cardioembolism from LV thrombus either.  LV-gram not done due to reported LV thrombus history.   RHC showed markedly elevated filling pressures and low cardiac index at 1.39.  He was started on milrinone and IV Lasix.  This morning, feeling considerably improved. Weight down 4 lbs.  - Place PICC today to follow CVP and co-ox.  - Still needs further diuresis.  Continue Lasix 80 mg IV bid.  - Continue milrinone today while diuresing. - Continue spironolactone.  Add lisinopril 2.5 mg daily and digoxin 0.125 daily.  - I am concerned that he will need advanced therapies in the future.  Will have him meet our LVAD nurse coordinator today.  We briefly discussed advanced therapy options (transplant/LVAD).  - Cardiac MRI for infilatrative disease.  - Will send SPEP/UPEP, HIV.  - He has a LBBB.  CRT-D likely be needed if we can wean off milrinone.  2. History of LV thrombus: Will look for this by MRI/echo.  Will continue heparin/coumadin overlap to therapeutic INR.   [name] 11/08/2015 8:33 AM

## 2015-11-08 NOTE — Progress Notes (Signed)
[facility] EDUCATION NOTE:   VAD evaluation consent reviewed and signed by patient and mother (designated caregiver).  Initial VAD teaching completed with pt and caregiver.   VAD educational packet reviewed in detail with me and left at bedside for continued reference.  All questions answered regarding VAD implant, hospital stay, and what to expect when discharged home living with a heart pump. Pt identified his mother as primary caregiver if this therapy should be deemed appropriate for him.  Explained need for 24/7 care when pt is  discharged home due to sternal precautions, adaptation to living on support, emotional support, consistent and meticulous exit site care and management, medication adherence and high volume of follow up visits with the VAD Clinic after discharge;  both pt and caregiver verbalized understanding of above.   Explained that LVAD can be implanted for two indications in the setting of advanced left ventricular heart failure treatment:  1. Bridge to transplant - used for patients who cannot safely wait for heart transplant without this device.  Or   2. Destination therapy - used for patients until end of life or recovery of heart function.  Patient and caregiver acknowledge that the indication at this point in time for LVAD therapy would be for destination therapy.    Pt currently lives in [place], but plans on moving to [place] where family support is available.    Reviewed and supplied a copy of home inspection check list stressing that only three pronged grounded power outlets can be used for VAD equipment. Patient confirmed home has electrical outlets that will support the equipment.   One of our current LVAD patients visited with them today to answer questions about living with LVAD.    The patient understands that from this discussion it does not mean that they will receive the device, but that depends on an extensive evaluation process. The patient is  aware of the fact that if at anytime they want to stop the evaluation process they can.  All questions have been answered at this time and contact information was provided should they encounter any further questions.  They are both agreeable at this time to the evaluation process and will move forward.   Total Session Time:  60 minutes  Hessie Diener, RN VAD Coordinator  Office: [phone] 24/7 VAD Pager: [phone]

## 2015-11-09 ENCOUNTER — Inpatient Hospital Stay (HOSPITAL_COMMUNITY): Payer: Medicaid Other

## 2015-11-09 DIAGNOSIS — Z515 Encounter for palliative care: Secondary | ICD-10-CM

## 2015-11-09 DIAGNOSIS — Z01818 Encounter for other preprocedural examination: Secondary | ICD-10-CM

## 2015-11-09 DIAGNOSIS — I509 Heart failure, unspecified: Secondary | ICD-10-CM

## 2015-11-09 DIAGNOSIS — I429 Cardiomyopathy, unspecified: Secondary | ICD-10-CM

## 2015-11-09 LAB — HIV ANTIBODY (ROUTINE TESTING W REFLEX): HIV SCREEN 4TH GENERATION: NONREACTIVE

## 2015-11-09 LAB — BLOOD GAS, ARTERIAL
Acid-Base Excess: 1.4 mmol/L (ref 0.0–2.0)
BICARBONATE: 24.6 meq/L — AB (ref 20.0–24.0)
DRAWN BY: 10552
FIO2: 0.21
O2 Saturation: 98.8 %
PH ART: 7.48 — AB (ref 7.350–7.450)
Patient temperature: 98.6
TCO2: 25.6 mmol/L (ref 0–100)
pCO2 arterial: 33.4 mmHg — ABNORMAL LOW (ref 35.0–45.0)
pO2, Arterial: 107 mmHg — ABNORMAL HIGH (ref 80.0–100.0)

## 2015-11-09 LAB — PROTEIN ELECTROPHORESIS, SERUM
A/G Ratio: 0.7 (ref 0.7–1.7)
Albumin ELP: 3.1 g/dL (ref 2.9–4.4)
Alpha-1-Globulin: 0.4 g/dL (ref 0.0–0.4)
Alpha-2-Globulin: 0.6 g/dL (ref 0.4–1.0)
Beta Globulin: 1.3 g/dL (ref 0.7–1.3)
GAMMA GLOBULIN: 2.2 g/dL — AB (ref 0.4–1.8)
GLOBULIN, TOTAL: 4.5 g/dL — AB (ref 2.2–3.9)
M-SPIKE, %: 1.4 g/dL — AB
TOTAL PROTEIN ELP: 7.6 g/dL (ref 6.0–8.5)

## 2015-11-09 LAB — LIPID PANEL
CHOL/HDL RATIO: 2.6 ratio
Cholesterol: 123 mg/dL (ref 0–200)
HDL: 47 mg/dL (ref >40)
LDL CALC: 59 mg/dL (ref 0–99)
TRIGLYCERIDES: 85 mg/dL (ref <150)
VLDL: 17 mg/dL (ref 0–40)

## 2015-11-09 LAB — CARBOXYHEMOGLOBIN
CARBOXYHEMOGLOBIN: 2.1 % — AB (ref 0.5–1.5)
Carboxyhemoglobin: 2.1 % — ABNORMAL HIGH (ref 0.5–1.5)
METHEMOGLOBIN: 0.8 % (ref 0.0–1.5)
Methemoglobin: 0.7 % (ref 0.0–1.5)
O2 SAT: 53.9 %
O2 Saturation: 67.5 %
TOTAL HEMOGLOBIN: 12.7 g/dL — AB (ref 13.5–18.0)
TOTAL HEMOGLOBIN: 12.6 g/dL — AB (ref 13.5–18.0)

## 2015-11-09 LAB — BASIC METABOLIC PANEL
ANION GAP: 10 (ref 5–15)
Anion gap: 9 (ref 5–15)
BUN: 18 mg/dL (ref 6–20)
BUN: 16 mg/dL (ref 6–20)
CALCIUM: 8.8 mg/dL — AB (ref 8.9–10.3)
CHLORIDE: 99 mmol/L — AB (ref 101–111)
CHLORIDE: 99 mmol/L — AB (ref 101–111)
CO2: 26 mmol/L (ref 22–32)
CO2: 27 mmol/L (ref 22–32)
CREATININE: 1.3 mg/dL — AB (ref 0.61–1.24)
CREATININE: 1.28 mg/dL — AB (ref 0.61–1.24)
Calcium: 8.6 mg/dL — ABNORMAL LOW (ref 8.9–10.3)
GFR calc Af Amer: >60 mL/min (ref >60)
GFR calc non Af Amer: 59 mL/min — ABNORMAL LOW (ref >60)
GFR calc non Af Amer: >60 mL/min (ref >60)
GLUCOSE: 139 mg/dL — AB (ref 65–99)
Glucose, Bld: 127 mg/dL — ABNORMAL HIGH (ref 65–99)
POTASSIUM: 3.5 mmol/L (ref 3.5–5.1)
Potassium: 3.9 mmol/L (ref 3.5–5.1)
SODIUM: 134 mmol/L — AB (ref 135–145)
SODIUM: 136 mmol/L (ref 135–145)

## 2015-11-09 LAB — IMMUNOFIXATION, URINE

## 2015-11-09 LAB — CBC
HCT: 37.9 % — ABNORMAL LOW (ref 39.0–52.0)
Hemoglobin: 12.3 g/dL — ABNORMAL LOW (ref 13.0–17.0)
MCH: 33.2 pg (ref 26.0–34.0)
MCHC: 32.5 g/dL (ref 30.0–36.0)
MCV: 102.2 fL — AB (ref 78.0–100.0)
PLATELETS: 237 10*3/uL (ref 150–400)
RBC: 3.71 MIL/uL — AB (ref 4.22–5.81)
RDW: 14.3 % (ref 11.5–15.5)
WBC: 4.9 10*3/uL (ref 4.0–10.5)

## 2015-11-09 LAB — PROTIME-INR
INR: 1.48 (ref 0.00–1.49)
PROTHROMBIN TIME: 18 s — AB (ref 11.6–15.2)

## 2015-11-09 LAB — PREALBUMIN: PREALBUMIN: 16.7 mg/dL — AB (ref 18–38)

## 2015-11-09 LAB — LACTATE DEHYDROGENASE: LDH: 240 U/L — ABNORMAL HIGH (ref 98–192)

## 2015-11-09 LAB — URIC ACID: Uric Acid, Serum: 11.8 mg/dL — ABNORMAL HIGH (ref 4.4–7.6)

## 2015-11-09 LAB — PSA: PSA: 0.28 ng/mL (ref 0.00–4.00)

## 2015-11-09 LAB — HEPARIN LEVEL (UNFRACTIONATED): Heparin Unfractionated: 0.3 IU/mL (ref 0.30–0.70)

## 2015-11-09 LAB — ECHOCARDIOGRAM COMPLETE: WEIGHTICAEL: 2532.64 [oz_av]

## 2015-11-09 LAB — ANTITHROMBIN III: AntiThromb III Func: 69 % — ABNORMAL LOW (ref 75–120)

## 2015-11-09 LAB — ABO/RH: ABO/RH(D): A POS

## 2015-11-09 LAB — TSH: TSH: 1.208 u[IU]/mL (ref 0.350–4.500)

## 2015-11-09 MED ORDER — PERFLUTREN LIPID MICROSPHERE
INTRAVENOUS | Status: AC
  Administered 2015-11-09: 2 mL
  Filled 2015-11-09: qty 10

## 2015-11-09 MED ORDER — MILRINONE IN DEXTROSE 20 MG/100ML IV SOLN
0.1250 ug/kg/min | INTRAVENOUS | Status: DC
  Administered 2015-11-09: 0.125 ug/kg/min via INTRAVENOUS

## 2015-11-09 MED ORDER — ENSURE ENLIVE PO LIQD
237.0000 mL | Freq: Two times a day (BID) | ORAL | Status: DC
  Administered 2015-11-09 – 2015-12-06 (×19): 237 mL via ORAL

## 2015-11-09 MED ORDER — TORSEMIDE 20 MG PO TABS
20.0000 mg | ORAL_TABLET | Freq: Every day | ORAL | Status: DC
  Administered 2015-11-10 – 2015-11-13 (×4): 20 mg via ORAL
  Filled 2015-11-09 (×4): qty 1

## 2015-11-09 MED ORDER — WARFARIN SODIUM 5 MG PO TABS
5.0000 mg | ORAL_TABLET | Freq: Once | ORAL | Status: AC
  Administered 2015-11-09: 5 mg via ORAL
  Filled 2015-11-09: qty 1

## 2015-11-09 MED ORDER — SPIRONOLACTONE 25 MG PO TABS
25.0000 mg | ORAL_TABLET | Freq: Every day | ORAL | Status: DC
  Administered 2015-11-09 – 2015-11-15 (×7): 25 mg via ORAL
  Filled 2015-11-09 (×7): qty 1

## 2015-11-09 MED ORDER — PERFLUTREN LIPID MICROSPHERE
1.0000 mL | INTRAVENOUS | Status: AC | PRN
  Filled 2015-11-09: qty 10

## 2015-11-09 NOTE — Progress Notes (Signed)
Initial Nutrition Assessment  DOCUMENTATION CODES:   Non-severe (moderate) malnutrition in context of chronic illness  INTERVENTION:   -Ensure Enlive po BID, each supplement provides 350 kcal and 20 grams of protein -Snacks BID (fresh fruit) per pt request  NUTRITION DIAGNOSIS:   Malnutrition related to chronic illness as evidenced by mild depletion of body fat, mild depletion of muscle mass.  GOAL:   Patient will meet greater than or equal to 90% of their needs  MONITOR:   PO intake, Supplement acceptance, Labs, Weight trends, Skin, I & O's  REASON FOR ASSESSMENT:   Consult  (VAD evaluation)  ASSESSMENT:   60 yo with history of nonischemic cardiomyopathy and apparently a prior LV thrombus presented with acute/chronic systolic CHF. He is followed by a cardiologist in [place].   Pt admitted with NSTEMI and CHF.   RD received consult for LVAD evaluation.   Hx obtained from pt and family members at bedside. Pt reports poor appetite over the past 1-2 months; he reveals that he experienced early satiety while eating related to fluid retention. He reports he often would eat only once daily. Pt describes himself as "a pretty clean eater". Diet recall is as follows: breakfast: grits, eggs, toast, bacon, lunch: two hard shell tacos, dinner: meat, starch, and vegetables. Pt reports that he has transitioned to baked and grilled meats as well as frozen vegetables. He also consumes a lot of fresh fruit. Beverages are mainly milk water and tea. He started using Ensure at home once daily to supplement intake.   Pt reports his appetite has improved since admission. He reports he consumed 100% of breakfast.   Pt reports UBW is around 195#. He endorses weight loss, but unable to provide a clear time frame or quantity. Pt feels wt loss is multifactorial (poor po intake and fluid loss).   Nutrition-Focused physical exam completed. Findings are mild fat depletion, mild muscle depletion, and  mild edema.   Discussed importance of good meal and supplement intake to promote healing. Emphasized importance of consuming high protein foods to help preserve muscle mass. Also reviewed Heart Healthy diet guidelines.   Labs reviewed: Na: 134  Diet Order:  Diet Heart Room service appropriate?: Yes; Fluid consistency:: Thin  Skin:  Reviewed, no issues  Last BM:  11/07/15  Height:   Ht Readings from Last 1 Encounters:  11/04/15 5\' 11"  (1.803 m)    Weight:   Wt Readings from Last 1 Encounters:  11/09/15 158 lb 4.6 oz (71.8 kg)    Ideal Body Weight:  78.2 kg  BMI:  Body mass index is 22.09 kg/(m^2).  Estimated Nutritional Needs:   Kcal:  1900-2100  Protein:  105-120 grams  Fluid:  1.9-2.1 L  EDUCATION NEEDS:   Education needs addressed  Lowana Hable A. Mayford Knife, RD, LDN, CDE Pager: [phone] After hours Pager: [phone]

## 2015-11-09 NOTE — Progress Notes (Signed)
ANTICOAGULATION CONSULT NOTE - Follow Up Consult  Pharmacy Consult for Heparin and Coumadin Indication: LV thrombus  No Known Allergies  Patient Measurements: Height: 5\' 11"  (180.3 cm) Weight: 158 lb 4.6 oz (71.8 kg) IBW/kg (Calculated) : 75.3  Vital Signs: Temp: 97.7 F (36.5 C) (04/26 1500) Temp Source: Oral (04/26 1500) BP: 94/73 mmHg (04/26 1150) Pulse Rate: 97 (04/26 1150)  Labs:  Recent Labs  11/07/15 0436 11/08/15 0256 11/08/15 0940 11/08/15 2130 11/09/15 0430 11/09/15 1040  HGB 11.3* 12.1*  --   --  12.3*  --   HCT 34.6* 36.8*  --   --  37.9*  --   PLT 238 224  --   --  237  --   LABPROT 20.5*  --   --   --  18.0*  --   INR 1.76*  --   --   --  1.48  --   HEPARINUNFRC 0.63 0.21* 0.49  --  0.30  --   CREATININE 1.20 1.32*  --  1.36* 1.30* 1.28*    Estimated Creatinine Clearance: 63.9 mL/min (by C-G formula based on Cr of 1.28).   Medications:  Heparin @ 1200 units/hr  Assessment: 60yom resumed on coumadin with heparin bridge after cath yesterday for his LV thrombus. Heparin level is now therapeutic at 0.3. INR is below goal and trending down 1.48 CBC stable. No bleeding reported.  Goal of Therapy:  INR 2-3 Heparin level 0.3-0.7 units/ml Monitor platelets by anticoagulation protocol: Yes   Plan:  1) Continue heparin at 1200 units/hr 2) repeat increased dose  coumadin to 5mg  x 1 3) Daily heparin level, INR, CBC  Leota Sauers Pharm.D. CPP, BCPS Clinical Pharmacist [phone] 11/09/2015 4:06 PM

## 2015-11-09 NOTE — Progress Notes (Signed)
  Echocardiogram 2D Echocardiogram has been performed.  Delcie Roch 11/09/2015, 9:57 AM

## 2015-11-09 NOTE — Progress Notes (Signed)
Pre-op Cardiac Surgery  Carotid Findings:  Bilateral: No significant (1-39%) ICA stenosis. Antegrade vertebral flow.     ABI completed.  Lower  Extremity Right Left      Anterior Tibial 102, Bi 77, Bi  Posterior Tibial 54, Bi 87, Bi  Ankle/Brachial Indices 1.11 0.95    Farrel Demark, RDMS, RVT 11/09/2015

## 2015-11-09 NOTE — Progress Notes (Signed)
CARDIAC REHAB PHASE I   PRE:  Rate/Rhythm: 98 SR    BP: sitting 94/63    SaO2: 96 RA  MODE:  Ambulation: 700 ft   POST:  Rate/Rhythm: 118 ST    BP: sitting 98/76     SaO2: 97 RA  Pt eager to walk. x1 episode of significant SOB. Stopped to rest. HR on monitor up to 150 but appeared to be artifact. Decreased to 110s with rest and being still. No other c/o, enjoyed rest of walk. Will f/u. [phone]   Harriet Masson CES, ACSM 11/09/2015 12:10 PM

## 2015-11-09 NOTE — Progress Notes (Signed)
Patient ID: Matthew Vaughan, male   DOB: August 18, 1955, 60 y.o.   MRN: [id number]   SUBJECTIVE: Feels better overall on milrinone.  Appetite coming back.  Good diuresis yesterday, weight down 4 lbs.  Today, CVP 8 and co-ox 68%.  LHC/RHC (11/07/15) Left Main  No significant coronary disease.      Left Anterior Descending  No significant coronary disease.     Ramus Intermedius  Moderate vessel, no significant coronary disease.     Left Circumflex  No significant coronary disease.     Right Coronary Artery  No significant coronary disease.      Wall Motion    Not done, possible LV thrombus.   RHC Procedural Findings (mmHg): Hemodynamics RA mean 22 RV 56/25 PA 57/34, mean 25 PCWP mean 35 AO 83/68  Oxygen saturations: PA 36% AO 100%  Cardiac Output (Fick) 2.72  Cardiac Index (Fick) 1.39  PVR 2.6 WU       Cardiac MRI (4/25): Severe LV dilation with EF 8%, small thrombus on mid anterior wall, mildly dilated RV with moderately decreased systolic function.  Patchy LGE pattern in inferior wall, ?prior myocarditis.   Filed Vitals:   11/09/15 0400 11/09/15 0416 11/09/15 0600 11/09/15 0800  BP: 91/78  90/67 93/74  Pulse: 96  84 86  Temp:  97.7 F (36.5 C)    TempSrc:  Oral    Resp: _0 Height:      Weight:  158 lb 4.6 oz (71.8 kg)    SpO2: 92%  95% 94%    Intake/Output Summary (Last 24 hours) at 11/09/15 0815 Last data filed at 11/09/15 0800  Gross per 24 hour  Intake  857.1 ml  Output   3775 ml  Net -2917.9 ml    LABS: Basic Metabolic Panel:  Recent Labs  11/06/15 1815  11/08/15 2130 11/09/15 0430  NA 137  < > 138 134*  K 3.8  < > 3.5 3.5  CL 101  < > 102 99*  CO2 22  < > 27 26  GLUCOSE 123*  < > 118* 139*  BUN 23*  < > 18 18  CREATININE 1.46*  < > 1.36* 1.30*  CALCIUM 9.0  < > 7.9* 8.6*  MG 2.2  --  1.4*  --   < > = values in this interval not displayed. Liver Function Tests:  Recent Labs  11/08/15 0256  AST 42*  ALT 34  ALKPHOS  115  BILITOT 2.5*  PROT 7.4  ALBUMIN 2.7*   No results for input(s): LIPASE, AMYLASE in the last 72 hours. CBC:  Recent Labs  11/08/15 0256 11/09/15 0430  WBC 4.9 4.9  HGB 12.1* 12.3*  HCT 36.8* 37.9*  MCV 99.7 102.2*  PLT 224 237   Cardiac Enzymes: No results for input(s): CKTOTAL, CKMB, CKMBINDEX, TROPONINI in the last 72 hours. BNP: Invalid input(s): POCBNP D-Dimer: No results for input(s): DDIMER in the last 72 hours. Hemoglobin A1C: No results for input(s): HGBA1C in the last 72 hours. Fasting Lipid Panel:  Recent Labs  11/09/15 0430  CHOL 123  HDL 47  LDLCALC 59  TRIG 85  CHOLHDL 2.6   Thyroid Function Tests: No results for input(s): TSH, T4TOTAL, T3FREE, THYROIDAB in the last 72 hours.  Invalid input(s): FREET3 Anemia Panel: No results for input(s): VITAMINB12, FOLATE, FERRITIN, TIBC, IRON, RETICCTPCT in the last 72 hours.  RADIOLOGY: Dg Chest 2 View  11/04/2015  CLINICAL DATA:  Chest pain and shortness of breath  for 1 day EXAM: CHEST  2 VIEW COMPARISON:  None. FINDINGS: Cardiac enlargement without vascular congestion. Focal area of nodular consolidation in the right upper lung probably represents focal pneumonia. Soft tissue lesion is not excluded. Followup PA and lateral chest X-ray is recommended in 3-4 weeks following trial of antibiotic therapy to ensure resolution and exclude underlying malignancy. Left lung is clear. No blunting of costophrenic angles. No pneumothorax. Mediastinal contours appear intact. Degenerative changes in the spine and shoulders. IMPRESSION: Cardiac enlargement. Focal nodular consolidation at the right upper lung likely represent pneumonia. Followup PA and lateral chest X-ray is recommended in 3-4 weeks following trial of antibiotic therapy to ensure resolution and exclude underlying malignancy. Electronically Signed   By: Lucienne Capers M.D.   On: 11/04/2015 23:00   Mr Card Morphology Wo/w Cm  11/09/2015  CLINICAL DATA:   Nonischemic cardiomyopathy EXAM: CARDIAC MRI TECHNIQUE: The patient was scanned on a 1.5 Tesla GE magnet. A dedicated cardiac coil was used. Functional imaging was done using Fiesta sequences. 2,3, and 4 chamber views were done to assess for RWMA's. Modified Simpson's rule using a short axis stack was used to calculate an ejection fraction on a dedicated work Conservation officer, nature. The patient received 25 cc of Multihance. After 10 minutes inversion recovery sequences were used to assess for infiltration and scar tissue. CONTRAST:  25 cc Multihance FINDINGS: Limited images of the lung fields showed no significant abnormalities. Markedly dilated left ventricle with relatively thin walls. Diffuse severe hypokinesis, EF 8%. There was a small LV thrombus adherent to the mid anterior wall. The right ventricle was mildly dilated with moderately decreased systolic function. There was mild to moderate central mitral regurgitation. Trileaflet aortic valve with no significant stenosis or regurgitation. Moderate left atrial enlargement. Mild right atrial enlargement. On delayed enhancement imaging, there was patchy late gadolinium enhancement (LGE) in the inferior wall. There was a small area of full thickness LGE in the basal inferior wall. There was a small area of near-full thickness LGE in the mid inferior wall. Finally, there were a couple of small areas of < 25% thickness subendocardial LGE in the apical inferior wall. MEASUREMENTS: LVEDV 533 mL LV SV 42 mL LV EF 8% IMPRESSION: 1. Markedly dilated left ventricle with very severe diffuse hypokinesis, EF 8%. 2.  Mildly dilated RV with moderately decreased systolic function. 3.  Small LV mid anterior wall thrombus. 4. Patchy inferior LGE pattern as described above. This is of uncertain significance, could be related to prior myocarditis. Malijah Lietz Electronically Signed   By: Loralie Champagne M.D.   On: 11/09/2015 00:43    PHYSICAL EXAM General: NAD Neck: JVP  8 cm, no thyromegaly or thyroid nodule.  Lungs: Clear to auscultation bilaterally with normal respiratory effort. CV: Lateral PMI.  Heart regular S1/S2, no S3/S4, no murmur.  No edema.   Abdomen: Soft, nontender, no hepatosplenomegaly, no distention.  Neurologic: Alert and oriented x 3.  Psych: Normal affect. Extremities: No clubbing or cyanosis.   TELEMETRY: Reviewed telemetry pt in NSR  ASSESSMENT AND PLAN: 60 yo with history of nonischemic cardiomyopathy and apparently a prior LV thrombus presented with acute/chronic systolic CHF. He is followed by a cardiologist in [place].  1. Acute/chronic systolic CHF: Nonischemic cardiomyopathy.  Cause uncertain => not a heavy drinker, had one sister with what sounds like sudden death.  Possible viral myocarditis. EF has been very low per his report, do not have echo done yet here.  Had chest pain and  elevated TnI at admission, so had LHC/RHC 4/24.  No coronary disease noted, no evidence for cardioembolism from LV thrombus either.  RHC showed markedly elevated filling pressures and low cardiac index at 1.39.  He was started on milrinone and IV Lasix.  Cardiac MRI was done, showing severe LV dilation with EF 8%, small LV thrombus, and mildly dilated RV with moderately decreased systolic function.  There was a patchy LGE pattern in the inferior wall, ?prior myocarditis.  HIV negative.  This morning, feeling considerably improved. Weight down 4 lbs again.  CVP 8 with co-ox 68%.  - Will give one more dose of IV Lasix this morning.  Transition to torsemide tomorrow.  - Decrease milrinone to 0.125 this afternoon and repeat co-ox in am.  May end up needing home milrinone.  - Continue current digoxin and lisinopril. - Increase spironolactone to 25 daily.  - No beta blocker with low output.   - I am concerned that he will need advanced therapies in the future.  He has met our LVAD nurse coordinator, we are going to proceed with workup.  - Pending SPEP/UPEP.   - He  has a LBBB.  CRT-D will likely be needed if we can wean off milrinone.  2. LV thrombus: Small thrombus on MRI.  On heparin/coumadin overlap.   Loralie Champagne 11/09/2015 8:15 AM

## 2015-11-09 NOTE — Consult Note (Signed)
Consultation Note Date: 11/09/2015   Patient Name: Matthew Vaughan  DOB: [birth year]  MRN: [id number]  Age / Sex: 60 y.o., male  PCP: No primary care provider on file. Referring Physician: Azalee Course, MD  Reason for Consultation: LVAD evaluation  HPI/Patient Profile: 60 y.o. male  admitted on 11/05/2015. Matthew Vaughan is a 60 y.o. male with a self-reported history of NICM and chronic systolic heart failure with LVEF=20% and HTN who presented to the ED with 3 days of worsening dyspnea. The pt is followed by a cardiologist at [facility] who manages his heart failure      Since arrival to the ED Matthew. Vaughan has remained hemodynamically stable and saturating well on RA.He is managed by the heart failure team and under consideration for LVAD procedure     Clinical Assessment and Goals of Care:    This NP Lorinda Creed reviewed medical records, received report from team, assessed the patient and then meet at the bedside with his mother to discuss advanced directives and a preparedness plan in light of anticipated LVAD implantation when medically indicated.    A detailed discussion was had today regarding the concept of a preparedness plan as it relates to LVAD therapy.    Patient and family were comfortable talking about the "what ifs" and the importance of today's conversation so everyone can have all the information to be full participants and to understand the patient's basic beliefs and wishes as it relates  to healthcare.  Concepts specific to future possibilities of -long term ventilation -artificial feeding and hydration -long term antibiotic use -dialysis -psychological adjustments -need to terminate the pump- Other chronic or terminal disease unrelated to cardiac LVAD  Patient was able to verbalize to his family the importance of quality of life.   Matthew Vaughan is hopeful that LVAD procedure  will increase his quality and quantity of life.   He  understands the risks and benefits o fthe procedure as it relates to his future.  At this time patient is open to all available medical interventions to prolong life and the success of the LVAD therapy. He shared and verbalized an understanding that his  family would know when the burdens of treatment would outweigh the benefits and trusts in his  family's support at that time.  Patient and family were encouraged to continue conversation into the future as it is vital for the patient centered care.   HCPOA: Plan is to secure HPOA and make his mother primary  SUMMARY OF RECOMMENDATIONS   Full code Code Status/Advance Care Planning:    Code Status Orders        Start     Ordered   11/05/15 0524  Full code   Continuous     11/05/15 0523    Code Status History    Date Active Date Inactive Code Status Order ID Comments User Context   This patient has a current code status but no historical code status.         Primary Diagnoses:  Present on Admission:  . NSTEMI (non-ST elevated myocardial infarction) (HCC) . Acute on chronic systolic and diastolic heart failure, NYHA class 4 (HCC) . Left ventricular thrombus (HCC)  I have reviewed the medical record, interviewed the patient and family, and examined the patient. The following aspects are pertinent.  Past Medical History  Diagnosis Date  . CHF (congestive heart failure) (HCC)   . Hypertension   . Left ventricular thrombus [facility])    Social History   Social History  . Marital Status: Divorced    Spouse Name: N/A  . Number of Children: N/A  . Years of Education: N/A   Social History Main Topics  . Smoking status: Former Games developer  . Smokeless tobacco: None  . Alcohol Use: No  . Drug Use: No  . Sexual Activity: Not Asked   Other Topics Concern  . None   Social History Narrative   History reviewed. No pertinent family history. Scheduled Meds: . antiseptic oral  rinse  7 mL Mouth Rinse BID  . digoxin  0.125 mg Oral Daily  . lisinopril  2.5 mg Oral Daily  . pneumococcal 23 valent vaccine  0.5 mL Intramuscular Tomorrow-1000  . sodium chloride flush  10-40 mL Intracatheter Q12H  . sodium chloride flush  3 mL Intravenous Q12H  . sodium chloride flush  3 mL Intravenous Q12H  . spironolactone  25 mg Oral Daily  . [START ON 11/10/2015] torsemide  20 mg Oral Daily  . Warfarin - Pharmacist Dosing Inpatient   Does not apply q1800   Continuous Infusions: . heparin 1,200 Units/hr (11/09/15 1046)  . milrinone 0.125 mcg/kg/min (11/09/15 1047)  . nitroGLYCERIN Stopped (11/07/15 1143)   PRN Meds:.sodium chloride, sodium chloride, acetaminophen, nitroGLYCERIN, ondansetron (ZOFRAN) IV, sodium chloride flush, sodium chloride flush, sodium chloride flush Medications Prior to Admission:  Prior to Admission medications   Medication Sig Start Date End Date Taking? Authorizing Provider  LASIX 40 MG tablet Take 40-80 mg by mouth See admin instructions. Alternate taking 1 tablet one day then take 2 tablets the next day 10/25/15  Yes Historical Provider, MD  metoprolol tartrate (LOPRESSOR) 25 MG tablet Take 12.5 mg by mouth daily.   Yes Historical Provider, MD  warfarin (COUMADIN) 2.5 MG tablet Take 2.5 mg by mouth daily. 10/13/15  Yes Historical Provider, MD   No Known Allergies Review of Systems  Constitutional: Positive for fatigue.  Cardiovascular: Positive for leg swelling.  Neurological: Positive for weakness.    Physical Exam  Constitutional: He appears cachectic. He appears ill.  HENT:  Mouth/Throat: Oropharynx is clear and moist.  Cardiovascular: Normal rate and regular rhythm.   Pulmonary/Chest: Effort normal and breath sounds normal.  Skin: Skin is warm and dry.    Vital Signs: BP 94/73 mmHg  Pulse 97  Temp(Src) 98.1 F (36.7 C) (Oral)  Resp 15  Ht 5\' 11"  (1.803 m)  Wt 71.8 kg (158 lb 4.6 oz)  BMI 22.09 kg/m2  SpO2 98% Pain Assessment:  No/denies pain POSS *See Group Information*: S-Acceptable,Sleep, easy to arouse Pain Score: 2    SpO2: SpO2: 98 % O2 Device:SpO2: 98 % O2 Flow Rate: .O2 Flow Rate (L/min): 2 L/min (arrived on )  IO: Intake/output summary:  Intake/Output Summary (Last 24 hours) at 11/09/15 1439 Last data filed at 11/09/15 1400  Gross per 24 hour  Intake 1063.2 ml  Output   3975 ml  Net -2911.8 ml    LBM: Last BM Date: 11/07/15 Baseline Weight: Weight: 75.297 kg (166 lb) Most  recent weight: Weight: 71.8 kg (158 lb 4.6 oz)        Time In: 1300 Time Out: 1400 Time Total: 60 min Greater than 50%  of this time was spent counseling and coordinating care related to the above assessment and plan.  Signed by: Lorinda Creed, NP   Please contact Palliative Medicine Team phone at [phone] for questions and concerns.

## 2015-11-10 ENCOUNTER — Inpatient Hospital Stay (HOSPITAL_COMMUNITY): Payer: Medicaid Other

## 2015-11-10 ENCOUNTER — Telehealth: Payer: Self-pay | Admitting: Licensed Clinical Social Worker

## 2015-11-10 DIAGNOSIS — I509 Heart failure, unspecified: Secondary | ICD-10-CM

## 2015-11-10 DIAGNOSIS — E440 Moderate protein-calorie malnutrition: Secondary | ICD-10-CM | POA: Insufficient documentation

## 2015-11-10 LAB — CARBOXYHEMOGLOBIN
CARBOXYHEMOGLOBIN: 2.2 % — AB (ref 0.5–1.5)
Carboxyhemoglobin: 2 % — ABNORMAL HIGH (ref 0.5–1.5)
METHEMOGLOBIN: 0.6 % (ref 0.0–1.5)
Methemoglobin: 0.8 % (ref 0.0–1.5)
O2 SAT: 59.6 %
O2 SAT: 56.6 %
TOTAL HEMOGLOBIN: 14.5 g/dL (ref 13.5–18.0)
Total hemoglobin: 11.8 g/dL — ABNORMAL LOW (ref 13.5–18.0)

## 2015-11-10 LAB — PULMONARY FUNCTION TEST
FEF 25-75 POST: 2.14 L/s
FEF 25-75 PRE: 2.26 L/s
FEF2575-%Change-Post: -5 %
FEF2575-%PRED-PRE: 75 %
FEF2575-%Pred-Post: 71 %
FEV1-%Change-Post: 0 %
FEV1-%PRED-POST: 80 %
FEV1-%PRED-PRE: 80 %
FEV1-POST: 2.56 L
FEV1-PRE: 2.55 L
FEV1FVC-%Change-Post: 3 %
FEV1FVC-%PRED-PRE: 95 %
FEV6-%CHANGE-POST: -3 %
FEV6-%PRED-PRE: 86 %
FEV6-%Pred-Post: 83 %
FEV6-POST: 3.25 L
FEV6-Pre: 3.38 L
FEV6FVC-%Change-Post: 0 %
FEV6FVC-%PRED-POST: 103 %
FEV6FVC-%Pred-Pre: 102 %
FVC-%Change-Post: -3 %
FVC-%PRED-PRE: 83 %
FVC-%Pred-Post: 81 %
FVC-PRE: 3.39 L
FVC-Post: 3.29 L
POST FEV6/FVC RATIO: 100 %
PRE FEV1/FVC RATIO: 75 %
Post FEV1/FVC ratio: 78 %
Pre FEV6/FVC Ratio: 100 %

## 2015-11-10 LAB — CBC
HCT: 37.2 % — ABNORMAL LOW (ref 39.0–52.0)
Hemoglobin: 12.3 g/dL — ABNORMAL LOW (ref 13.0–17.0)
MCH: 33.1 pg (ref 26.0–34.0)
MCHC: 33.1 g/dL (ref 30.0–36.0)
MCV: 100 fL (ref 78.0–100.0)
PLATELETS: 253 10*3/uL (ref 150–400)
RBC: 3.72 MIL/uL — ABNORMAL LOW (ref 4.22–5.81)
RDW: 13.9 % (ref 11.5–15.5)
WBC: 4.8 10*3/uL (ref 4.0–10.5)

## 2015-11-10 LAB — HEMOGLOBIN A1C
Hgb A1c MFr Bld: 6.3 % — ABNORMAL HIGH (ref 4.8–5.6)
Mean Plasma Glucose: 134 mg/dL

## 2015-11-10 LAB — BASIC METABOLIC PANEL
ANION GAP: 9 (ref 5–15)
Anion gap: 11 (ref 5–15)
BUN: 17 mg/dL (ref 6–20)
BUN: 19 mg/dL (ref 6–20)
CHLORIDE: 97 mmol/L — AB (ref 101–111)
CHLORIDE: 97 mmol/L — AB (ref 101–111)
CO2: 27 mmol/L (ref 22–32)
CO2: 26 mmol/L (ref 22–32)
CREATININE: 1.07 mg/dL (ref 0.61–1.24)
Calcium: 8.6 mg/dL — ABNORMAL LOW (ref 8.9–10.3)
Calcium: 9 mg/dL (ref 8.9–10.3)
Creatinine, Ser: 1.17 mg/dL (ref 0.61–1.24)
GFR calc non Af Amer: >60 mL/min (ref >60)
GFR calc non Af Amer: >60 mL/min (ref >60)
GLUCOSE: 182 mg/dL — AB (ref 65–99)
Glucose, Bld: 96 mg/dL (ref 65–99)
POTASSIUM: 3.9 mmol/L (ref 3.5–5.1)
Potassium: 4 mmol/L (ref 3.5–5.1)
Sodium: 133 mmol/L — ABNORMAL LOW (ref 135–145)
Sodium: 134 mmol/L — ABNORMAL LOW (ref 135–145)

## 2015-11-10 LAB — HEPATITIS B SURFACE ANTIBODY,QUALITATIVE: HEP B S AB: NONREACTIVE

## 2015-11-10 LAB — PROTIME-INR
INR: 1.5 — ABNORMAL HIGH (ref 0.00–1.49)
PROTHROMBIN TIME: 18.2 s — AB (ref 11.6–15.2)

## 2015-11-10 LAB — HEPATITIS C ANTIBODY: HCV Ab: <0.1 s/co ratio (ref 0.0–0.9)

## 2015-11-10 LAB — HEPARIN LEVEL (UNFRACTIONATED)
HEPARIN UNFRACTIONATED: 0.24 [IU]/mL — AB (ref 0.30–0.70)
Heparin Unfractionated: 0.42 IU/mL (ref 0.30–0.70)

## 2015-11-10 LAB — MAGNESIUM: Magnesium: 1.8 mg/dL (ref 1.7–2.4)

## 2015-11-10 LAB — HEPATITIS B CORE ANTIBODY, IGM: Hep B C IgM: NEGATIVE

## 2015-11-10 LAB — HEPATITIS B SURFACE ANTIGEN: HEP B S AG: NEGATIVE

## 2015-11-10 MED ORDER — ALBUTEROL SULFATE (2.5 MG/3ML) 0.083% IN NEBU
2.5000 mg | INHALATION_SOLUTION | Freq: Once | RESPIRATORY_TRACT | Status: AC
  Administered 2015-11-10: 2.5 mg via RESPIRATORY_TRACT

## 2015-11-10 MED ORDER — LISINOPRIL 2.5 MG PO TABS
2.5000 mg | ORAL_TABLET | Freq: Two times a day (BID) | ORAL | Status: DC
  Administered 2015-11-10 – 2015-11-14 (×7): 2.5 mg via ORAL
  Filled 2015-11-10 (×10): qty 1

## 2015-11-10 MED ORDER — WARFARIN SODIUM 5 MG PO TABS
5.0000 mg | ORAL_TABLET | Freq: Once | ORAL | Status: AC
  Administered 2015-11-10: 5 mg via ORAL
  Filled 2015-11-10: qty 1

## 2015-11-10 MED ORDER — MAGNESIUM SULFATE 2 GM/50ML IV SOLN
2.0000 g | Freq: Once | INTRAVENOUS | Status: AC
  Administered 2015-11-10: 2 g via INTRAVENOUS
  Filled 2015-11-10: qty 50

## 2015-11-10 NOTE — Progress Notes (Signed)
RN notified of 18 beat run of Vtach. Patient alert and back to baseline rhythm upon RN arrival to room. Mardelle Matte, [place] also here and made aware. Labs ordered and drawn. Magnesium ordered by PA.

## 2015-11-10 NOTE — Progress Notes (Signed)
ANTICOAGULATION CONSULT NOTE - Follow Up Consult  Pharmacy Consult for heparin Indication: LV thrombus  Labs:  Recent Labs  11/08/15 0256 11/08/15 0940 11/08/15 2130 11/09/15 0430 11/09/15 1040 11/10/15 0425  HGB 12.1*  --   --  12.3*  --  12.3*  HCT 36.8*  --   --  37.9*  --  37.2*  PLT 224  --   --  237  --  253  LABPROT  --   --   --  18.0*  --  18.2*  INR  --   --   --  1.48  --  1.50*  HEPARINUNFRC 0.21* 0.49  --  0.30  --  0.24*  CREATININE 1.32*  --  1.36* 1.30* 1.28*  --     Assessment: 60yo male now subtherapeutic on heparin after two levels at goal though had been trending down and was at low end of goal yesterday.  Goal of Therapy:  Heparin level 0.3-0.7 units/ml   Plan:  Will increase heparin gtt slightly to 1300 units/hr and check level in 6hr.  Matthew Vaughan, PharmD, BCPS  11/10/2015,5:13 AM

## 2015-11-10 NOTE — Progress Notes (Signed)
Patient walked 1 lap around unit independently with supervision. HR no higher than low 100s, Sats stable >/=94%, pt denied any CP, SOB, dizziness and tolerated ambulation well. Saturation 100% when back to chair.

## 2015-11-10 NOTE — Progress Notes (Signed)
LVAD Initial Psychosocial Screening  Date/Time Initiated:  11/09/15 11:30am Referral Source:  Hessie Diener, VAD Coordinator Referral Reason:  LVAD Implantation Source of Information:  Pt., mother and chart review  Demographics Name:  Matthew Vaughan Address:  [address] [place] [address]  Secondary Address: [address]     (brother's home)    [place], [place]  Home phone: Jacqualin Combes: [phone] Marital Status:  Divorced (20 years ago) Faith:  none Primary Language:  English SS: [id number]    DOB:  1955-07-26  Medical & Follow-up Adherence to Medical regimen/INR checks:  compliant Medication adherence:  compliant Physician/Clinic Appointment Attendance:  compliant   Advance Directives: Do you have a Living Will or Medical POA?  no Would you like to complete a Living Will and Medical POA prior to surgery?  considering Do you have Goals of Care?  Discussed and encouraged patient to discuss further with Palliative Care during assessement Have you had a consult with the Palliative Care Team at [facility]? Not yet  Psychological Health Appearance:  Well groomed in hospital gown Mental Status: alert and oriented   Eye Contact:  good Thought Content:  coherent Speech:  Clear and strong Mood:  pleasant Affect:  positive Insight:  good Judgement: sound Interaction Style:  Friendly and interactive  Family/Social Information Who lives in your home? Name:   Relationship:   Lives alone in [place], [place]  Other family members/support persons in your life? Name:   Relationship:   Tawanna Sat Mother Yvette Rack  Sister Tye Maryland  Brother Donn Pierini Friend Christian  Son [phone])  Caregiving Needs Who is the primary caregiver? Tawanna Sat  Health status:  good Do you drive?  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  no Contact number: [phone]  Who is the secondary  caregiver? Yvette Rack Health status:  Good Do you drive?   no Do you work?  no Physical Limitations: none   Do you have other care giving responsibilities?  no Contact number: [phone]  Who is the secondary caregiver? Tye Maryland Health status:  Good Do you drive?   yes Do you work?  Yes  12 hr shifts  x2 days rotation Physical Limitations: none   Do you have other care giving responsibilities?  no Contact number: [phone]  Home Environment/Personal Care Do you own or rent your home? Rents apartment in [facility] (plans to stay with brother in [place]) Number of steps into the home? Few steps How many levels in the home? 1st floor apartment Assistive devices in the home? none Electrical needs for LVAD (3 prong outlets)? Yes both at his own apartment and brothers home Second hand smoke exposure in the home? no Travel distance from [facility]? Brothers 10 minutes - Own 1.5 hours Self-care: independent Ambulation: independent although SOB after short distance  Community Are you active with community agencies/resources/homecare? none Are you active in a church, Conservation officer, nature, mosque or other faith based community?none What other sources do you have for spiritual support? none Are you active in any clubs or social organizations? none What do you do for fun?  Hobbies?  Interests? Prior to illness enjoyed going to the gym, golf, bowling and cycling  Education/Work Information What is the last grade of school you completed? 52yrs college Preferred method of learning?  Written, Verbal and Hands on Do you have any problems with reading or writing?  no  Are you currently employed?  no  When were you last employed? August 24, 2015  Name of employer? 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 Since 11/2014- Patient worked various jobs/companies over the years. If you are not working, do you plan to return to work  after VAD surgery? depends If yes, what type of employment do you hope to find? Same type Are you interested in job training or learning new skills? unsure Did you serve in the military?  If so, what branch?           [employer] for 3 years  Financial Information What is your source of income? None-pending SSD Do you have difficulty meeting your monthly expenses?yes If yes, which ones? All of them How do you cope with this? "Deal with it" Family have offered to assist Can you budget for the monthly cost for dressing supplies post procedure?  Discussed but currently has overall  financial concerns. Primary Health insurance: pending Pharmacist, hospital [facility]. [phone] Marissa Calamity  [phone]) Secondary Insurance: Prescription plan: none Pharmacy:  ? What are your prescription co-pays? Full cost (family assist) Do you use mail order for your prescriptions?  no Have you ever had to refuse medication due to cost?  yes Have you applied for Medicaid?  yes Have you applied for Social Security Disability (SSI)  yes  Medical Information Briefly describe why you are here for evaluation: Patient states he was hospitalized in 2009 for SOB and states "nothing found" although in retrospect probably CHF and then hospitalized again in 2015 for "fluid".  Do you have a PCP or other medical provider? Patient states Dr. Williemae Area although not seen recently. No primary care provider on file. Are you able to complete your ADL's? yes Do you have a history of trauma, physical, emotional, or sexual abuse? no Do you have any family history of heart problems? Sister died from sudden death at age 44 Do you smoke now or past usage? Past for 25 years   Quit date: 2009 Do you drink alcohol now or past usage? "couple of beers here and there"    Quit date:  2010 Are you currently using illegal drugs or misuse of medication or past usage?  no Have you ever been treated for substance abuse? no  Mental Health History How  have you been feeling in the past year? SOB everyday Have you ever had any problems with depression, anxiety or other mental health issues? none Do you see a counselor, psychiatrist or therapist?  no If you are currently experiencing problems are you interested in talking with a professional? no Have you or are you taking medications for anxiety/depression or any mental health concerns?  none What are your coping strategies under stressful situations? "I'm really good at it- work through it myself" Are there any other stressors in your life? finances Have you had any past or current thoughts of suicide? no How many hours do you sleep at night? 4 hrs a night How is your appetite? improving Would you be interested in attending the LVAD support group? yes  PHQ2 Depression Scale:0 PHQ9 Depression scale (if positive PHQ2 screen):   n/a Hospital Anxiety and Depression Screen (HADS) score: 0  Legal Do you currently have any legal issues/problems?  none Have you had any legal issues/problems in the past?  none Do you have a Durable POA?  no Name of DOA? Contact number:  Plan for VAD Implementation Do you know and understand what happens during the VAD surgery? Patient verbalizes understanding of OR process and surgical procedure. What do you know about the risks and side effect associated with VAD surgery? Patient verbalizes understanding of infection, stroke and death. Explain what will happen right after surgery:   Patient states understanding of OR to ICU and intubated. What is your plan for transportation for the first 8 weeks post-surgery? (Patients are not recommended to drive post-surgery for 8 weeks) patient reports Brother will drive  Driver:  Tye Maryland Do you have airbags in your vehicle?  There is a risk of discharging the device if the airbag were to deploy. Discussed at length with patient and family What do you know about your diet post-surgery?  Heart Healthy   How do you  plan to monitor your medications, current and future?   Pill box How do you plan to complete ADL's post-surgery?  Hope to be Independent but will ask for help if needed Will it be difficult to ask for help from your caregivers?  No supportive family  Please explain what you hope will be improved about your life as a result of receiving the LVAD? Breathing better and being more mobile Please tell me your biggest concern or fear about living with the LVAD?   The operation itself Please explain your understanding of how your body will change?  Nothing bothers me Are you worried about these changes? no Do you see any barriers to your surgery or follow-up?  none  Understanding of LVAD Patient states understanding of the following: Surgical procedures and risks, Electrical need for LVAD (3 prong outlets), Safety precautions with LVAD (water, etc.), LVAD daily self-care (dressing changes, computer check, extra supplies), Outpatient follow up (LVAD clinic appts, monitoring blood thinners) and Need for Emergency Planning  Discussed and Reviewed with Patient and Caregivers  Patient's current level of motivation to prepare for LVAD: very motivated Patient's present Level of Consent for LVAD: "Let's do it"   Education provided to patient/family/caregiver:   Caregiver role and responsibiltiy, Financial planning for LVAD, Role of Clinical Social Worker and Signs of Depression and Anxiety   Comments:   CSW discussed concerns about lack of financial support and insurance. Patient does have pending SSD and medicaid although unsure of when approval will be and financial means will be available to patient who currently has no income and unable to pay rent or any other financial needs.   Caregiver questions Please explain what you hope will be improved about your life and loved one's life as a result of receiving the LVAD? "live a normal life again"  What is your biggest concern or fear about caregiving  with an LVAD patient?  "learning how to do it" What is your plan for availability to provide care 24/7 x2 weeks post op and dressing changes ongoing?  Patient's mother states she plans to be here and take off whatever time is needed to care for patient. Who is the relief/backup caregiver and what is their availability?  Elnita Maxwell (sister) and Arlys John (brother) Preferred method of learning? Written, Verbal and Hands on  Do you drive? No How do you handle stressful situations?  "good" Do you think you can do this? Yes Is there anything that concerns about caregiving?  No Mother is a retired Public house manager and currently employed as a Associate Professor Do you provide caregiving to anyone else? No HADS [facility] Anxiety and Depression Screen score) Caregiver:  6  Caregiver's current level of motivation to prepare for LVAD: very motivated for her son Caregiver's present level of consent for LVAD: Ready  Clinical Interventions Needed:    CSW will follow up with medicaid worker to assist with any needed items to help expedite approval. CSW will continue to assist with SSD process and assist with any paperwork needed. CSW shared concerns about immediate financial concerns and ability to financially meet household bills. CSW will refer to community resources although limited options as patient has already accessed many in his community of [place].   Clinical Impressions/Recommendations:   Mr. Khamis appears to be a good psychosocial (pending resolution of financial issues) candidate for LVAD implantation. He has good family support and options for caregiving although his primary caregiver Fleet Contras (mother) resides in [place] she appears to be committed to caring for patient. Patient is the oldest of 6 children and his brother (lives in [place]) and sister (lives in [place]) have also been supportive and willing to assist with caregiving as well. Patient currently resides alone in [place], [place] in an apartment. Patient plans to  remain in [place] with his brother and family during recovery and as long as needed for successful implantation. Patient has been unemployed since August 24, 2015 and has no current income. Patient's mother has assisted with applications for SSD and medicaid and both are pending with undetermined approval date at this time. Patient is divorced and has a son Ephriam Knuckles 69 yo. He states he is compliant with his medical regimen and denies any advanced directives but willing to complete prior to surgery. Patient denies any history of trauma or substance abuse. Patient states history of tobacco and alcohol use although no current use of either. Patient denies any mental health concerns or need for intervention. Patient states "I'm really good at handling stress and work through it myself". He scored a 0 on the PHQ2 and a 0 on the HADS. Patient verbalizes a good understanding of the surgery, risk factors, recovery and follow up needed for successful LVAD implantation. He hopes to breathe better and be more mobile after implantation. Patient and caregiver Tawanna Sat) appear motivated for LVAD implantation and ready to pursue should patient be identified as a good candidate.  Marcy Siren, [place] [phone]

## 2015-11-10 NOTE — Progress Notes (Addendum)
Paged for 18 beat run of Vtach.  Pt felt palpitations like heart was "flopping around in chest" and became SOB for a moment.  Now resting comfortable in chair.     Recheck stat BMET and Magnesium.  Casimiro Needle [address] [place], PA-C 11/10/2015 1:48 PM   Addendum  Mg 1.8 K 4.0  supp Mg with 2 g IV.  Recheck in am.   Baldwin Crown" [place], [place] 11/10/2015 2:56 PM

## 2015-11-10 NOTE — Progress Notes (Signed)
ANTICOAGULATION CONSULT NOTE - Follow Up Consult  Pharmacy Consult for Heparin and Coumadin Indication: LV thrombus  No Known Allergies  Patient Measurements: Height: 5\' 11"  (180.3 cm) Weight: 157 lb 8 oz (71.442 kg) IBW/kg (Calculated) : 75.3  Vital Signs: Temp: 97.4 F (36.3 C) (04/27 1137) Temp Source: Oral (04/27 1137) BP: 98/83 mmHg (04/27 1137) Pulse Rate: 99 (04/27 1137)  Labs:  Recent Labs  11/08/15 0256  11/09/15 0430 11/09/15 1040 11/10/15 0425 11/10/15 1200  HGB 12.1*  --  12.3*  --  12.3*  --   HCT 36.8*  --  37.9*  --  37.2*  --   PLT 224  --  237  --  253  --   LABPROT  --   --  18.0*  --  18.2*  --   INR  --   --  1.48  --  1.50*  --   HEPARINUNFRC 0.21*  < > 0.30  --  0.24* 0.42  CREATININE 1.32*  < > 1.30* 1.28* 1.07  --   < > = values in this interval not displayed.  Estimated Creatinine Clearance: 76 mL/min (by C-G formula based on Cr of 1.07).   Medications:  Heparin @ 1300 units/hr  Assessment: 60yom resumed on coumadin with heparin bridge after cath yesterday for his LV thrombus. Heparin level is now therapeutic at 0.42. INR is below goal but starting to trend up 1.5 CBC stable. No bleeding reported.  Goal of Therapy:  INR 2-3 Heparin level 0.3-0.7 units/ml Monitor platelets by anticoagulation protocol: Yes   Plan:  1) Continue heparin at 1300 units/hr 2) repeat increased dose  coumadin 5mg  x 1 3) Daily heparin level, INR, CBC  Leota Sauers Pharm.D. CPP, BCPS Clinical Pharmacist [phone] 11/10/2015 1:37 PM

## 2015-11-10 NOTE — Progress Notes (Signed)
Patient ID: Matthew Vaughan, male   DOB: [birth year], 60 y.o.   MRN: [id number]   SUBJECTIVE: Feels better overall on milrinone.  Appetite coming back.  Mild dyspnea with walk yesterday. CVP now down to 4, creatinine stable.  Co-ox 59.6% on milrinone 0.125.    LHC/RHC (11/07/15) Left Main  No significant coronary disease.      Left Anterior Descending  No significant coronary disease.     Ramus Intermedius  Moderate vessel, no significant coronary disease.     Left Circumflex  No significant coronary disease.     Right Coronary Artery  No significant coronary disease.      Wall Motion    Not done, possible LV thrombus.   RHC Procedural Findings (mmHg): Hemodynamics RA mean 22 RV 56/25 PA 57/34, mean 25 PCWP mean 35 AO 83/68  Oxygen saturations: PA 36% AO 100%  Cardiac Output (Fick) 2.72  Cardiac Index (Fick) 1.39  PVR 2.6 WU       Cardiac MRI (4/25): Severe LV dilation with EF 8%, small thrombus on mid anterior wall, mildly dilated RV with moderately decreased systolic function.  Patchy LGE pattern in inferior wall, ?prior myocarditis.   Scheduled Meds: . antiseptic oral rinse  7 mL Mouth Rinse BID  . digoxin  0.125 mg Oral Daily  . feeding supplement (ENSURE ENLIVE)  237 mL Oral BID BM  . lisinopril  2.5 mg Oral BID  . pneumococcal 23 valent vaccine  0.5 mL Intramuscular Tomorrow-1000  . sodium chloride flush  10-40 mL Intracatheter Q12H  . sodium chloride flush  3 mL Intravenous Q12H  . sodium chloride flush  3 mL Intravenous Q12H  . spironolactone  25 mg Oral Daily  . torsemide  20 mg Oral Daily  . warfarin  5 mg Oral ONCE-1800  . Warfarin - Pharmacist Dosing Inpatient   Does not apply q1800   Continuous Infusions: . heparin 1,300 Units/hr (11/10/15 0700)  . nitroGLYCERIN Stopped (11/07/15 1143)   PRN Meds:.sodium chloride, sodium chloride, acetaminophen, nitroGLYCERIN, ondansetron (ZOFRAN) IV, sodium chloride flush, sodium chloride flush, sodium  chloride flush   Filed Vitals:   11/10/15 0400 11/10/15 0500 11/10/15 0600 11/10/15 0803  BP: 90/74  86/74 90/76  Pulse: 93  86 94  Temp: 98.1 F (36.7 C)   97.6 F (36.4 C)  TempSrc: Oral   Oral  Resp: '27  11 24  '$ Height:      Weight:  157 lb 8 oz (71.442 kg)    SpO2: 93%  97% 98%    Intake/Output Summary (Last 24 hours) at 11/10/15 0844 Last data filed at 11/10/15 0700  Gross per 24 hour  Intake 914.98 ml  Output   1500 ml  Net -585.02 ml    LABS: Basic Metabolic Panel:  Recent Labs  11/08/15 2130  11/09/15 1040 11/10/15 0425  NA 138  < > 136 133*  K 3.5  < > 3.9 3.9  CL 102  < > 99* 97*  CO2 27  < > 27 27  GLUCOSE 118*  < > 127* 96  BUN 18  < > 16 17  CREATININE 1.36*  < > 1.28* 1.07  CALCIUM 7.9*  < > 8.8* 8.6*  MG 1.4*  --   --   --   < > = values in this interval not displayed. Liver Function Tests:  Recent Labs  11/08/15 0256  AST 42*  ALT 34  ALKPHOS 115  BILITOT 2.5*  PROT 7.4  ALBUMIN 2.7*  No results for input(s): LIPASE, AMYLASE in the last 72 hours. CBC:  Recent Labs  11/09/15 0430 11/10/15 0425  WBC 4.9 4.8  HGB 12.3* 12.3*  HCT 37.9* 37.2*  MCV 102.2* 100.0  PLT 237 253   Cardiac Enzymes: No results for input(s): CKTOTAL, CKMB, CKMBINDEX, TROPONINI in the last 72 hours. BNP: Invalid input(s): POCBNP D-Dimer: No results for input(s): DDIMER in the last 72 hours. Hemoglobin A1C:  Recent Labs  11/09/15 0430  HGBA1C 6.3*   Fasting Lipid Panel:  Recent Labs  11/09/15 0430  CHOL 123  HDL 47  LDLCALC 59  TRIG 85  CHOLHDL 2.6   Thyroid Function Tests:  Recent Labs  11/09/15 0430  TSH 1.208   Anemia Panel: No results for input(s): VITAMINB12, FOLATE, FERRITIN, TIBC, IRON, RETICCTPCT in the last 72 hours.  RADIOLOGY: Ct Abdomen Pelvis Wo Contrast  11/09/2015  CLINICAL DATA:  Preoperative evaluation, prior to surgical treatment of nonischemic cardiomyopathy and left ventricular thrombus. Initial encounter.  EXAM: CT CHEST, ABDOMEN AND PELVIS WITHOUT CONTRAST TECHNIQUE: Multidetector CT imaging of the chest, abdomen and pelvis was performed following the standard protocol without IV contrast. COMPARISON:  None. FINDINGS: CT CHEST Patchy peripheral opacities within the right upper and middle lobes likely reflect scarring, though mild infection could have a similar appearance. The lungs are otherwise grossly clear. No pleural effusion or pneumothorax is seen. No masses are identified. Minimal blebs are noted at the lung apices. The heart is mildly enlarged, with left ventricular enlargement. A right PICC is noted ending about the proximal SVC. No pericardial effusion is identified. The great vessels are grossly unremarkable in appearance. No mediastinal lymphadenopathy is seen. The visualized portions of thyroid gland are unremarkable. No axillary lymphadenopathy is seen. No acute osseous abnormalities are identified. CT ABDOMEN AND PELVIS The liver and spleen are unremarkable in appearance. The gallbladder is within normal limits. The pancreas and adrenal glands are unremarkable. The kidneys are unremarkable in appearance. There is no evidence of hydronephrosis. No renal or ureteral stones are seen. No perinephric stranding is appreciated. No free fluid is identified. The small bowel is unremarkable in appearance. The stomach is within normal limits. No acute vascular abnormalities are seen. The appendix is normal in caliber, without evidence of appendicitis. The colon is grossly unremarkable in appearance. The bladder is mildly distended and grossly remarkable. The prostate remains normal in size. No inguinal lymphadenopathy is seen. No acute osseous abnormalities are identified. IMPRESSION: 1. Patchy peripheral opacities within the right upper and middle lobes likely reflect scarring, though mild infection could have a similar appearance. 2. Mild cardiomegaly, with left ventricular enlargement. 3. No acute abnormality  seen within the abdomen or pelvis. Electronically Signed   By: Garald Balding M.D.   On: 11/09/2015 20:00   Dg Chest 2 View  11/04/2015  CLINICAL DATA:  Chest pain and shortness of breath for 1 day EXAM: CHEST  2 VIEW COMPARISON:  None. FINDINGS: Cardiac enlargement without vascular congestion. Focal area of nodular consolidation in the right upper lung probably represents focal pneumonia. Soft tissue lesion is not excluded. Followup PA and lateral chest X-ray is recommended in 3-4 weeks following trial of antibiotic therapy to ensure resolution and exclude underlying malignancy. Left lung is clear. No blunting of costophrenic angles. No pneumothorax. Mediastinal contours appear intact. Degenerative changes in the spine and shoulders. IMPRESSION: Cardiac enlargement. Focal nodular consolidation at the right upper lung likely represent pneumonia. Followup PA and lateral chest X-ray is recommended in 3-4  weeks following trial of antibiotic therapy to ensure resolution and exclude underlying malignancy. Electronically Signed   By: Lucienne Capers M.D.   On: 11/04/2015 23:00   Ct Chest Wo Contrast  11/09/2015  CLINICAL DATA:  Preoperative evaluation, prior to surgical treatment of nonischemic cardiomyopathy and left ventricular thrombus. Initial encounter. EXAM: CT CHEST, ABDOMEN AND PELVIS WITHOUT CONTRAST TECHNIQUE: Multidetector CT imaging of the chest, abdomen and pelvis was performed following the standard protocol without IV contrast. COMPARISON:  None. FINDINGS: CT CHEST Patchy peripheral opacities within the right upper and middle lobes likely reflect scarring, though mild infection could have a similar appearance. The lungs are otherwise grossly clear. No pleural effusion or pneumothorax is seen. No masses are identified. Minimal blebs are noted at the lung apices. The heart is mildly enlarged, with left ventricular enlargement. A right PICC is noted ending about the proximal SVC. No pericardial effusion  is identified. The great vessels are grossly unremarkable in appearance. No mediastinal lymphadenopathy is seen. The visualized portions of thyroid gland are unremarkable. No axillary lymphadenopathy is seen. No acute osseous abnormalities are identified. CT ABDOMEN AND PELVIS The liver and spleen are unremarkable in appearance. The gallbladder is within normal limits. The pancreas and adrenal glands are unremarkable. The kidneys are unremarkable in appearance. There is no evidence of hydronephrosis. No renal or ureteral stones are seen. No perinephric stranding is appreciated. No free fluid is identified. The small bowel is unremarkable in appearance. The stomach is within normal limits. No acute vascular abnormalities are seen. The appendix is normal in caliber, without evidence of appendicitis. The colon is grossly unremarkable in appearance. The bladder is mildly distended and grossly remarkable. The prostate remains normal in size. No inguinal lymphadenopathy is seen. No acute osseous abnormalities are identified. IMPRESSION: 1. Patchy peripheral opacities within the right upper and middle lobes likely reflect scarring, though mild infection could have a similar appearance. 2. Mild cardiomegaly, with left ventricular enlargement. 3. No acute abnormality seen within the abdomen or pelvis. Electronically Signed   By: Garald Balding M.D.   On: 11/09/2015 20:00   Mr Card Morphology Wo/w Cm  11/09/2015  CLINICAL DATA:  Nonischemic cardiomyopathy EXAM: CARDIAC MRI TECHNIQUE: The patient was scanned on a 1.5 Tesla GE magnet. A dedicated cardiac coil was used. Functional imaging was done using Fiesta sequences. 2,3, and 4 chamber views were done to assess for RWMA's. Modified Simpson's rule using a short axis stack was used to calculate an ejection fraction on a dedicated work Conservation officer, nature. The patient received 25 cc of Multihance. After 10 minutes inversion recovery sequences were used to assess  for infiltration and scar tissue. CONTRAST:  25 cc Multihance FINDINGS: Limited images of the lung fields showed no significant abnormalities. Markedly dilated left ventricle with relatively thin walls. Diffuse severe hypokinesis, EF 8%. There was a small LV thrombus adherent to the mid anterior wall. The right ventricle was mildly dilated with moderately decreased systolic function. There was mild to moderate central mitral regurgitation. Trileaflet aortic valve with no significant stenosis or regurgitation. Moderate left atrial enlargement. Mild right atrial enlargement. On delayed enhancement imaging, there was patchy late gadolinium enhancement (LGE) in the inferior wall. There was a small area of full thickness LGE in the basal inferior wall. There was a small area of near-full thickness LGE in the mid inferior wall. Finally, there were a couple of small areas of < 25% thickness subendocardial LGE in the apical inferior wall. MEASUREMENTS: LVEDV 533  mL LV SV 42 mL LV EF 8% IMPRESSION: 1. Markedly dilated left ventricle with very severe diffuse hypokinesis, EF 8%. 2.  Mildly dilated RV with moderately decreased systolic function. 3.  Small LV mid anterior wall thrombus. 4. Patchy inferior LGE pattern as described above. This is of uncertain significance, could be related to prior myocarditis. Matthew Vaughan Electronically Signed   By: Loralie Champagne M.D.   On: 11/09/2015 00:43    PHYSICAL EXAM General: NAD Neck: JVP 7 cm, no thyromegaly or thyroid nodule.  Lungs: Clear to auscultation bilaterally with normal respiratory effort. CV: Lateral PMI.  Heart regular S1/S2, no S3/S4, no murmur.  No edema.   Abdomen: Soft, nontender, no hepatosplenomegaly, no distention.  Neurologic: Alert and oriented x 3.  Psych: Normal affect. Extremities: No clubbing or cyanosis.   TELEMETRY: Reviewed telemetry pt in NSR  ASSESSMENT AND PLAN: 60 yo with history of nonischemic cardiomyopathy and apparently a prior LV  thrombus presented with acute/chronic systolic CHF. He is followed by a cardiologist in [place].  1. Acute/chronic systolic CHF: Nonischemic cardiomyopathy.  Cause uncertain => not a heavy drinker, had one sister with what sounds like sudden death.  Possible viral myocarditis. EF has been very low per his report, do not have echo done yet here.  Had chest pain and elevated TnI at admission, so had LHC/RHC 4/24.  No coronary disease noted, no evidence for cardioembolism from LV thrombus either.  RHC showed markedly elevated filling pressures and low cardiac index at 1.39.  He was started on milrinone and IV Lasix.  Cardiac MRI was done, showing severe LV dilation with EF 8%, small LV thrombus, and mildly dilated RV with moderately decreased systolic function.  There was a patchy LGE pattern in the inferior wall, ?prior myocarditis.  HIV negative.  Feels much better on milrinone.  CVP down to 4, co-ox lower at 59.6% today on milrinone 0.125.  - Will try him off milrinone today => suspect his output will drop and he will need to resume.  Repeat co-ox at noon off milrinone.  - Continue po torsemide.  - Continue current digoxin and spironolactone. - Increase lisinopril to 2.5 mg bid.   - No beta blocker with low output.   - I am concerned that he will need advanced therapies in the future.  He has met our LVAD nurse coordinator, we are going to proceed with workup.  Insurance will be an issue, Medicaid is pending.  He will need to meet one of the surgeons.  My suspicion is that he is going to be milrinone-dependent.  - Pending SPEP/UPEP.   - He has a LBBB and no ICD.  Will need to decide on device placement.   2. LV thrombus: Small thrombus on MRI.  On heparin/coumadin overlap, INR 1.5 today.   Loralie Champagne 11/10/2015 8:44 AM

## 2015-11-10 NOTE — Progress Notes (Signed)
CARDIAC REHAB PHASE I   PRE:  Rate/Rhythm: 104 ST  BP:  Supine:   Sitting: 99/80  Standing:    SaO2: 98%RA  MODE:  Ambulation: 810 ft   POST:  Rate/Rhythm: 120 ST  BP:  Supine:   Sitting: 102/86  Standing:    SaO2: 98%RA 0955-1017 Pt anxious to walk. Walked 810 ft on RA with steady gait. Stated breathing so much better that he could not walk this far in a while. Heart rate elevated at walk but down with rest. No complaints except pain in feet after walk and requested tylenol. Notified RN. Heart rate showed 150 during walk but looked like artifact since it quickly dropped to 120 with rest.   Luetta Nutting, RN BSN  11/10/2015 10:13 AM

## 2015-11-10 NOTE — Progress Notes (Signed)
3 Days Post-Op Procedure(s) (LRB): Left Heart Cath and Coronary Angiography (N/A) Subjective: Patient examined, coronary angiograms, 2-D echocardiogram and right heart cath data personally reviewed and counseled with patient.  60 year old AA male with nonischemic cardiomyopathy for several years managed through clinic in [place] with oral medications. Ejection fraction has been proximally 10% but clinically he has been compensated until recently. His exercise tolerance, loss of  appetite and weight loss, and dyspnea on exertion and peripheral edema all severely progressed over the past 2-3 months.  The patient was admitted in cardiogenic shock with fluid overload. He responded well to diuretics, milrinone, and heparin was started for an LV apical thrombus. His milrinone has been weaned off and is co-ox is morning was 56%.  His past medical history is significant for prior knee surgery. He has received general anesthesia. He is right-hand dominant. He has no smoking history.   Vital signs in last 24 hours: Temp:  [97.4 F (36.3 C)-98.1 F (36.7 C)] 97.8 F (36.6 C) (04/27 1653) Pulse Rate:  [38-111] 92 (04/27 1600) Cardiac Rhythm:  [-] Normal sinus rhythm;Bundle branch block (04/27 1924) Resp:  [11-27] 11 (04/27 1600) BP: (85-106)/(66-84) 87/66 mmHg (04/27 1600) SpO2:  [93 %-98 %] 96 % (04/27 1600) Weight:  [157 lb 8 oz (71.442 kg)] 157 lb 8 oz (71.442 kg) (04/27 0500)      Physical Exam  General: Middle-aged male AA  who appears chronically ill but in no acute distress  HEENT: Normocephalic pupils equal , dentition adequate Neck: Supple without JVD, adenopathy, or bruit Chest: Clear to auscultation, symmetrical breath sounds, no rhonchi, no tenderness             or deformity Cardiovascular: Regular rate and rhythm, no murmur, no gallop, peripheral pulses             palpable in all extremities Abdomen:  Soft, nontender, no palpable mass or organomegaly Extremities: Warm,  well-perfused, no clubbing cyanosis edema or tenderness,              no venous stasis changes of the legs Rectal/GU: Deferred Neuro: Grossly non--focal and symmetrical throughout Skin: Clean and dry without rash or ulceration    Hemodynamic parameters for last 24 hours: CVP:  [3 mmHg-5 mmHg] 4 mmHg  Intake/Output from previous day: 04/26 0701 - 04/27 0700 In: 1082.7 [P.O.:717; I.V.:365.7] Out: 1800 [Urine:1800] Intake/Output this shift:      Lab Results:  Recent Labs  11/09/15 0430 11/10/15 0425  WBC 4.9 4.8  HGB 12.3* 12.3*  HCT 37.9* 37.2*  PLT 237 253   BMET:  Recent Labs  11/10/15 0425 11/10/15 1357  NA 133* 134*  K 3.9 4.0  CL 97* 97*  CO2 27 26  GLUCOSE 96 182*  BUN 17 19  CREATININE 1.07 1.17  CALCIUM 8.6* 9.0    PT/INR:  Recent Labs  11/10/15 0425  LABPROT 18.2*  INR 1.50*   ABG    Component Value Date/Time   PHART 7.480* 11/09/2015 0856   HCO3 24.6* 11/09/2015 0856   TCO2 25.6 11/09/2015 0856   ACIDBASEDEF 1.0 11/07/2015 1129   ACIDBASEDEF 2.0 11/07/2015 1129   O2SAT 56.6 11/10/2015 1205   CBG (last 3)  No results for input(s): GLUCAP in the last 72 hours.  Assessment/Plan: S/P Procedure(s) (LRB): Left Heart Cath and Coronary Angiography (N/A) Nonischemic cardiomyopathy with ejection fraction 10% Acute on chronic systolic heart failure LV apical thrombus on anticoagulation Significant weight loss due to heart failure, now improving  Patient  appears to be an acceptable candidate for consideration for implantable LVAD either as destination therapy or bridge to transplantation. Recommend completing his much of the bed evaluation as possible during his hospitalization   LOS: 5 days    Kathlee Nations Trigt III 11/10/2015

## 2015-11-10 NOTE — Progress Notes (Signed)
   11/10/15 1553  Clinical Encounter Type  Visited With Patient;Health care provider  Visit Type Initial  Referral From Palliative care team  Spiritual Encounters  Spiritual Needs Literature  Stress Factors  Patient Stress Factors Health changes   Chaplain responded to a request from Palliative Care to visit with the patient who is being considered for an LVAD. Chaplain met with patient, who seems to be fairly knowledgeable about his situation (indicated that the bottom left side of his heart isn't pumping; knows he has congestive heart failure, but also indicated this episode might be attributable to a virus). Patient also indicated that he is fairly content, and denied feeling depressed or bothered about what's going.   Patient expressed that he would not want to be on a ventilator if things go poorly. Chaplain offered advance directive paperwork for the patient so he could put those wishes in writing. Chaplain services are available to assist Matthew Vaughan with this document or any other needs.   Jeri Lager, Chaplain  11/10/2015 3:57 PM

## 2015-11-10 NOTE — Telephone Encounter (Signed)
CSW has left multiple messages for medicaid worker Arrie Senate [phone] in [place] to inquire about patient's medicaid status. Patient's mother reports she has not heard back and awaiting response as well. CSW will continue to attempt contact. Lasandra Beech, LCSW [phone]

## 2015-11-11 LAB — LUPUS ANTICOAGULANT PANEL
DRVVT: 43.6 s (ref 0.0–44.0)
PTT LA: 47.7 s — AB (ref 0.0–43.6)

## 2015-11-11 LAB — BASIC METABOLIC PANEL
Anion gap: 9 (ref 5–15)
BUN: 22 mg/dL — AB (ref 6–20)
CHLORIDE: 100 mmol/L — AB (ref 101–111)
CO2: 26 mmol/L (ref 22–32)
Calcium: 8.8 mg/dL — ABNORMAL LOW (ref 8.9–10.3)
Creatinine, Ser: 1.12 mg/dL (ref 0.61–1.24)
Glucose, Bld: 112 mg/dL — ABNORMAL HIGH (ref 65–99)
POTASSIUM: 4.5 mmol/L (ref 3.5–5.1)
SODIUM: 135 mmol/L (ref 135–145)

## 2015-11-11 LAB — CBC
HEMATOCRIT: 38.9 % — AB (ref 39.0–52.0)
HEMOGLOBIN: 12.9 g/dL — AB (ref 13.0–17.0)
MCH: 33.7 pg (ref 26.0–34.0)
MCHC: 33.2 g/dL (ref 30.0–36.0)
MCV: 101.6 fL — AB (ref 78.0–100.0)
Platelets: 258 10*3/uL (ref 150–400)
RBC: 3.83 MIL/uL — AB (ref 4.22–5.81)
RDW: 14.2 % (ref 11.5–15.5)
WBC: 4.8 10*3/uL (ref 4.0–10.5)

## 2015-11-11 LAB — PTT-LA MIX: PTT-LA Mix: 42.3 s — ABNORMAL HIGH (ref 0.0–40.6)

## 2015-11-11 LAB — CARBOXYHEMOGLOBIN
CARBOXYHEMOGLOBIN: 1.8 % — AB (ref 0.5–1.5)
Methemoglobin: 0.6 % (ref 0.0–1.5)
O2 Saturation: 51.9 %
TOTAL HEMOGLOBIN: 13.4 g/dL — AB (ref 13.5–18.0)

## 2015-11-11 LAB — HEXAGONAL PHASE PHOSPHOLIPID: Hexagonal Phase Phospholipid: 3 s (ref 0–11)

## 2015-11-11 LAB — HEPARIN LEVEL (UNFRACTIONATED): HEPARIN UNFRACTIONATED: 0.45 [IU]/mL (ref 0.30–0.70)

## 2015-11-11 LAB — MAGNESIUM: MAGNESIUM: 2.2 mg/dL (ref 1.7–2.4)

## 2015-11-11 LAB — PROTIME-INR
INR: 1.51 — AB (ref 0.00–1.49)
Prothrombin Time: 18.2 seconds — ABNORMAL HIGH (ref 11.6–15.2)

## 2015-11-11 MED ORDER — WARFARIN SODIUM 7.5 MG PO TABS
7.5000 mg | ORAL_TABLET | Freq: Once | ORAL | Status: AC
  Administered 2015-11-11: 7.5 mg via ORAL
  Filled 2015-11-11: qty 1

## 2015-11-11 MED ORDER — MILRINONE LACTATE IN DEXTROSE 20-5 MG/100ML-% IV SOLN
0.3750 ug/kg/min | INTRAVENOUS | Status: DC
  Administered 2015-11-11 – 2015-11-16 (×8): 0.25 ug/kg/min via INTRAVENOUS
  Administered 2015-11-17: .25 mg/kg/min via INTRAVENOUS
  Administered 2015-11-17: 0.25 ug/kg/min via INTRAVENOUS
  Administered 2015-11-19 – 2015-11-23 (×10): 0.375 ug/kg/min via INTRAVENOUS
  Filled 2015-11-11 (×22): qty 100

## 2015-11-11 NOTE — Progress Notes (Signed)
After receiving a phone call from Lasandra Beech LCSW in [facility] I sent copied paperwork and release of information form via email to Tammy Marissa [facility] worker).  She is to review forms and contact myself, Lasandra Beech or Hessie Diener with any further required documentation for his Medicaid application review.

## 2015-11-11 NOTE — Progress Notes (Signed)
[facility]  Patient Status:  New pt for [facility] this admission  [facility] is providing the following services: Home Infusion Pharmacy for  Home Milrinone if needed at DC home.  [facility] will work with hospital Case Manager to secure [facility] agency as pt is outside [facility] nursing service area. Spoke with [facility] and she is aware.   If patient discharges after hours, please call [phone].   Sedalia Muta 11/11/2015, 10:04 AM

## 2015-11-11 NOTE — Care Management Note (Signed)
Case Management Note  Patient Details  Name: Matthew Vaughan MRN: [id number] Date of Birth: [birth year]  Subjective/Objective:  Pt to discharge home on milrinone gtt.  Discussed home health and infusion therapy with pt and he agrees, states his brother will be staying with him for support.  Referral made to [facility] for IV milrinone and home health RN visits.           Expected Discharge Plan:  Home w Home Health Services  Discharge planning Services  CM Consult  Post Acute Care Choice:  Durable Medical Equipment, Home Health  DME Arranged:  IV pump/equipment DME Agency:  [facility]  HH Arranged:  RN [facility] Agency:  [facility]  Status of Service:  Completed, signed off  Magdalene River, [place] 11/11/2015, 11:47 AM

## 2015-11-11 NOTE — Progress Notes (Signed)
Patient ID: Matthew Vaughan, male   DOB: 09/01/1955, 60 y.o.   MRN: [id number]   SUBJECTIVE: CVP 8, co-ox down to 51.9% off milrinone.  Feels ok this morning, a little more tired with walk yesterday.     LHC/RHC (11/07/15) Left Main  No significant coronary disease.      Left Anterior Descending  No significant coronary disease.     Ramus Intermedius  Moderate vessel, no significant coronary disease.     Left Circumflex  No significant coronary disease.     Right Coronary Artery  No significant coronary disease.      Wall Motion    Not done, possible LV thrombus.   RHC Procedural Findings (mmHg): Hemodynamics RA mean 22 RV 56/25 PA 57/34, mean 25 PCWP mean 35 AO 83/68  Oxygen saturations: PA 36% AO 100%  Cardiac Output (Fick) 2.72  Cardiac Index (Fick) 1.39  PVR 2.6 WU       Cardiac MRI (4/25): Severe LV dilation with EF 8%, small thrombus on mid anterior wall, mildly dilated RV with moderately decreased systolic function.  Patchy LGE pattern in inferior wall, ?prior myocarditis.   Scheduled Meds: . antiseptic oral rinse  7 mL Mouth Rinse BID  . digoxin  0.125 mg Oral Daily  . feeding supplement (ENSURE ENLIVE)  237 mL Oral BID BM  . lisinopril  2.5 mg Oral BID  . pneumococcal 23 valent vaccine  0.5 mL Intramuscular Tomorrow-1000  . sodium chloride flush  10-40 mL Intracatheter Q12H  . sodium chloride flush  3 mL Intravenous Q12H  . sodium chloride flush  3 mL Intravenous Q12H  . spironolactone  25 mg Oral Daily  . torsemide  20 mg Oral Daily  . Warfarin - Pharmacist Dosing Inpatient   Does not apply q1800   Continuous Infusions: . heparin 1,300 Units/hr (11/10/15 0700)  . milrinone     PRN Meds:.sodium chloride, sodium chloride, acetaminophen, nitroGLYCERIN, ondansetron (ZOFRAN) IV, sodium chloride flush, sodium chloride flush, sodium chloride flush   Filed Vitals:   11/11/15 0200 11/11/15 0345 11/11/15 0400 11/11/15 0500  BP: 85/74 85/74 92/79      Pulse: 78  88   Temp:  97.8 F (36.6 C)    TempSrc:  Oral    Resp: 15 20 13    Height:      Weight:    157 lb 6.4 oz (71.396 kg)  SpO2: 98% 94% 99%     Intake/Output Summary (Last 24 hours) at 11/11/15 0735 Last data filed at 11/11/15 0600  Gross per 24 hour  Intake  554.6 ml  Output   1020 ml  Net -465.4 ml    LABS: Basic Metabolic Panel:  Recent Labs  52/84/13 1357 11/11/15 0320  NA 134* 135  K 4.0 4.5  CL 97* 100*  CO2 26 26  GLUCOSE 182* 112*  BUN 19 22*  CREATININE 1.17 1.12  CALCIUM 9.0 8.8*  MG 1.8 2.2   Liver Function Tests: No results for input(s): AST, ALT, ALKPHOS, BILITOT, PROT, ALBUMIN in the last 72 hours. No results for input(s): LIPASE, AMYLASE in the last 72 hours. CBC:  Recent Labs  11/10/15 0425 11/11/15 0320  WBC 4.8 4.8  HGB 12.3* 12.9*  HCT 37.2* 38.9*  MCV 100.0 101.6*  PLT 253 258   Cardiac Enzymes: No results for input(s): CKTOTAL, CKMB, CKMBINDEX, TROPONINI in the last 72 hours. BNP: Invalid input(s): POCBNP D-Dimer: No results for input(s): DDIMER in the last 72 hours. Hemoglobin A1C:  Recent Labs  11/09/15 0430  HGBA1C 6.3*   Fasting Lipid Panel:  Recent Labs  11/09/15 0430  CHOL 123  HDL 47  LDLCALC 59  TRIG 85  CHOLHDL 2.6   Thyroid Function Tests:  Recent Labs  11/09/15 0430  TSH 1.208   Anemia Panel: No results for input(s): VITAMINB12, FOLATE, FERRITIN, TIBC, IRON, RETICCTPCT in the last 72 hours.  RADIOLOGY: Ct Abdomen Pelvis Wo Contrast  11/09/2015  CLINICAL DATA:  Preoperative evaluation, prior to surgical treatment of nonischemic cardiomyopathy and left ventricular thrombus. Initial encounter. EXAM: CT CHEST, ABDOMEN AND PELVIS WITHOUT CONTRAST TECHNIQUE: Multidetector CT imaging of the chest, abdomen and pelvis was performed following the standard protocol without IV contrast. COMPARISON:  None. FINDINGS: CT CHEST Patchy peripheral opacities within the right upper and middle lobes likely  reflect scarring, though mild infection could have a similar appearance. The lungs are otherwise grossly clear. No pleural effusion or pneumothorax is seen. No masses are identified. Minimal blebs are noted at the lung apices. The heart is mildly enlarged, with left ventricular enlargement. A right PICC is noted ending about the proximal SVC. No pericardial effusion is identified. The great vessels are grossly unremarkable in appearance. No mediastinal lymphadenopathy is seen. The visualized portions of thyroid gland are unremarkable. No axillary lymphadenopathy is seen. No acute osseous abnormalities are identified. CT ABDOMEN AND PELVIS The liver and spleen are unremarkable in appearance. The gallbladder is within normal limits. The pancreas and adrenal glands are unremarkable. The kidneys are unremarkable in appearance. There is no evidence of hydronephrosis. No renal or ureteral stones are seen. No perinephric stranding is appreciated. No free fluid is identified. The small bowel is unremarkable in appearance. The stomach is within normal limits. No acute vascular abnormalities are seen. The appendix is normal in caliber, without evidence of appendicitis. The colon is grossly unremarkable in appearance. The bladder is mildly distended and grossly remarkable. The prostate remains normal in size. No inguinal lymphadenopathy is seen. No acute osseous abnormalities are identified. IMPRESSION: 1. Patchy peripheral opacities within the right upper and middle lobes likely reflect scarring, though mild infection could have a similar appearance. 2. Mild cardiomegaly, with left ventricular enlargement. 3. No acute abnormality seen within the abdomen or pelvis. Electronically Signed   By: Roanna Raider M.D.   On: 11/09/2015 20:00   Dg Chest 2 View  11/04/2015  CLINICAL DATA:  Chest pain and shortness of breath for 1 day EXAM: CHEST  2 VIEW COMPARISON:  None. FINDINGS: Cardiac enlargement without vascular congestion.  Focal area of nodular consolidation in the right upper lung probably represents focal pneumonia. Soft tissue lesion is not excluded. Followup PA and lateral chest X-ray is recommended in 3-4 weeks following trial of antibiotic therapy to ensure resolution and exclude underlying malignancy. Left lung is clear. No blunting of costophrenic angles. No pneumothorax. Mediastinal contours appear intact. Degenerative changes in the spine and shoulders. IMPRESSION: Cardiac enlargement. Focal nodular consolidation at the right upper lung likely represent pneumonia. Followup PA and lateral chest X-ray is recommended in 3-4 weeks following trial of antibiotic therapy to ensure resolution and exclude underlying malignancy. Electronically Signed   By: Burman Nieves M.D.   On: 11/04/2015 23:00   Ct Chest Wo Contrast  11/09/2015  CLINICAL DATA:  Preoperative evaluation, prior to surgical treatment of nonischemic cardiomyopathy and left ventricular thrombus. Initial encounter. EXAM: CT CHEST, ABDOMEN AND PELVIS WITHOUT CONTRAST TECHNIQUE: Multidetector CT imaging of the chest, abdomen and pelvis was performed following the standard protocol  without IV contrast. COMPARISON:  None. FINDINGS: CT CHEST Patchy peripheral opacities within the right upper and middle lobes likely reflect scarring, though mild infection could have a similar appearance. The lungs are otherwise grossly clear. No pleural effusion or pneumothorax is seen. No masses are identified. Minimal blebs are noted at the lung apices. The heart is mildly enlarged, with left ventricular enlargement. A right PICC is noted ending about the proximal SVC. No pericardial effusion is identified. The great vessels are grossly unremarkable in appearance. No mediastinal lymphadenopathy is seen. The visualized portions of thyroid gland are unremarkable. No axillary lymphadenopathy is seen. No acute osseous abnormalities are identified. CT ABDOMEN AND PELVIS The liver and spleen  are unremarkable in appearance. The gallbladder is within normal limits. The pancreas and adrenal glands are unremarkable. The kidneys are unremarkable in appearance. There is no evidence of hydronephrosis. No renal or ureteral stones are seen. No perinephric stranding is appreciated. No free fluid is identified. The small bowel is unremarkable in appearance. The stomach is within normal limits. No acute vascular abnormalities are seen. The appendix is normal in caliber, without evidence of appendicitis. The colon is grossly unremarkable in appearance. The bladder is mildly distended and grossly remarkable. The prostate remains normal in size. No inguinal lymphadenopathy is seen. No acute osseous abnormalities are identified. IMPRESSION: 1. Patchy peripheral opacities within the right upper and middle lobes likely reflect scarring, though mild infection could have a similar appearance. 2. Mild cardiomegaly, with left ventricular enlargement. 3. No acute abnormality seen within the abdomen or pelvis. Electronically Signed   By: Roanna Raider M.D.   On: 11/09/2015 20:00   Mr Card Morphology Wo/w Cm  11/09/2015  CLINICAL DATA:  Nonischemic cardiomyopathy EXAM: CARDIAC MRI TECHNIQUE: The patient was scanned on a 1.5 Tesla GE magnet. A dedicated cardiac coil was used. Functional imaging was done using Fiesta sequences. 2,3, and 4 chamber views were done to assess for RWMA's. Modified Simpson's rule using a short axis stack was used to calculate an ejection fraction on a dedicated work [employer]. The patient received 25 cc of Multihance. After 10 minutes inversion recovery sequences were used to assess for infiltration and scar tissue. CONTRAST:  25 cc Multihance FINDINGS: Limited images of the lung fields showed no significant abnormalities. Markedly dilated left ventricle with relatively thin walls. Diffuse severe hypokinesis, EF 8%. There was a small LV thrombus adherent to the mid anterior  wall. The right ventricle was mildly dilated with moderately decreased systolic function. There was mild to moderate central mitral regurgitation. Trileaflet aortic valve with no significant stenosis or regurgitation. Moderate left atrial enlargement. Mild right atrial enlargement. On delayed enhancement imaging, there was patchy late gadolinium enhancement (LGE) in the inferior wall. There was a small area of full thickness LGE in the basal inferior wall. There was a small area of near-full thickness LGE in the mid inferior wall. Finally, there were a couple of small areas of < 25% thickness subendocardial LGE in the apical inferior wall. MEASUREMENTS: LVEDV 533 mL LV SV 42 mL LV EF 8% IMPRESSION: 1. Markedly dilated left ventricle with very severe diffuse hypokinesis, EF 8%. 2.  Mildly dilated RV with moderately decreased systolic function. 3.  Small LV mid anterior wall thrombus. 4. Patchy inferior LGE pattern as described above. This is of uncertain significance, could be related to prior myocarditis. Denver Bentson Electronically Signed   By: [name] M.D.   On: 11/09/2015 00:43    PHYSICAL  EXAM General: NAD Neck: JVP 7 cm, no thyromegaly or thyroid nodule.  Lungs: Clear to auscultation bilaterally with normal respiratory effort. CV: Lateral PMI.  Heart regular S1/S2, no S3/S4, no murmur.  No edema.   Abdomen: Soft, nontender, no hepatosplenomegaly, no distention.  Neurologic: Alert and oriented x 3.  Psych: Normal affect. Extremities: No clubbing or cyanosis.   TELEMETRY: Reviewed telemetry pt in NSR  ASSESSMENT AND PLAN: 60 yo with history of nonischemic cardiomyopathy and apparently a prior LV thrombus presented with acute/chronic systolic CHF. He is followed by a cardiologist in [place].  1. Acute/chronic systolic CHF: Nonischemic cardiomyopathy.  Cause uncertain => not a heavy drinker, had one sister with what sounds like sudden death.  Possible viral myocarditis. EF has been very low  per his report, do not have echo done yet here.  Had chest pain and elevated TnI at admission, so had LHC/RHC 4/24.  No coronary disease noted, no evidence for cardioembolism from LV thrombus either.  RHC showed markedly elevated filling pressures and low cardiac index at 1.39.  He was started on milrinone and IV Lasix.  Cardiac MRI was done, showing severe LV dilation with EF 8%, small LV thrombus, and mildly dilated RV with moderately decreased systolic function.  There was a patchy LGE pattern in the inferior wall, ?prior myocarditis.  HIV negative.  Felt much better on milrinone.  I weaned him off milrinone yesterday, co-ox down to 51.9% today and CVP 8.  Feels more tired.   - I think that he will need to go back on milrinone at 0.25.  - Continue po torsemide.  - Continue current digoxin, lisinopril, and spironolactone. - No beta blocker with low output.   - M-spike noted on SPEP, will arrange for serum immunofixation though does not look like cardiac amyloidosis on MRI.  - He has a LBBB and no ICD (pending Medicaid).  - At this point, Mr Martelli is looking milrinone-dependent.  He has no device.  He has Medicaid pending.  We are proceeding with LVAD workup and he has been seen by surgeon.  Need to work out insurance issues as soon as possible, even sending him home on milrinone at this point may be an issue.  Will discuss with social work and LVAD Nurse, adult.  I would favor moving towards LVAD soon.  Going home on milrinone is going to be a problem without Medicaid yet, as would be getting a Lifevest.  2. LV thrombus: Small thrombus on MRI.  On heparin/coumadin overlap, INR 1.5 today.   [name] 11/11/2015 7:35 AM

## 2015-11-11 NOTE — Progress Notes (Signed)
CARDIAC REHAB PHASE I   PRE:  Rate/Rhythm: 100 SR  BP:  Supine: 94/62  Sitting:   Standing:    SaO2: 100%RA  MODE:  Ambulation: 990 ft Six minute walk test  POST:  Rate/Rhythm: 98-125 SR-ST occasional PVC  BP:  Supine:   Sitting: 101/74  Standing:    SaO2: 98%RA 1130-1203 Pt walked 990 ft on RA with fast pace for 6 minute walk test. Did not have to rest and denied SOB. To bathroom after walk and then to sitting on side of bed. Tolerated well.Luetta Nutting, RN BSN  11/11/2015 11:59 AM

## 2015-11-11 NOTE — Progress Notes (Signed)
ANTICOAGULATION CONSULT NOTE - Follow Up Consult  Pharmacy Consult for Heparin and Coumadin Indication: LV thrombus  No Known Allergies  Patient Measurements: Height: 5\' 11"  (180.3 cm) Weight: 157 lb 6.4 oz (71.396 kg) IBW/kg (Calculated) : 75.3  Vital Signs: Temp: 97.2 F (36.2 C) (04/28 1224) Temp Source: Axillary (04/28 1224) BP: 99/81 mmHg (04/28 1224) Pulse Rate: 99 (04/28 1224)  Labs:  Recent Labs  11/09/15 0430  11/10/15 0425 11/10/15 1200 11/10/15 1357 11/11/15 0320 11/11/15 0327  HGB 12.3*  --  12.3*  --   --  12.9*  --   HCT 37.9*  --  37.2*  --   --  38.9*  --   PLT 237  --  253  --   --  258  --   LABPROT 18.0*  --  18.2*  --   --  18.2*  --   INR 1.48  --  1.50*  --   --  1.51*  --   HEPARINUNFRC 0.30  --  0.24* 0.42  --   --  0.45  CREATININE 1.30*  < > 1.07  --  1.17 1.12  --   < > = values in this interval not displayed.  Estimated Creatinine Clearance: 72.6 mL/min (by C-G formula based on Cr of 1.12).   Medications:  Heparin @ 1300 units/hr  Assessment: 60yom resumed on coumadin with heparin bridge after cath yesterday for his LV thrombus. Heparin level is now therapeutic at 0.45. INR is below goal  1.5 and slow to increase - will boost CBC stable. No bleeding reported.  PTA warfarin 2.5mg  daily  Goal of Therapy:  INR 2-3 Heparin level 0.3-0.7 units/ml Monitor platelets by anticoagulation protocol: Yes   Plan:  1) Continue heparin at 1300 units/hr 2) increase dose  coumadin 7.5mg  x 1 3) Daily heparin level, INR, CBC  Leota Sauers Pharm.D. CPP, BCPS Clinical Pharmacist [phone] 11/11/2015 12:26 PM

## 2015-11-12 DIAGNOSIS — R570 Cardiogenic shock: Secondary | ICD-10-CM

## 2015-11-12 LAB — BASIC METABOLIC PANEL
ANION GAP: 9 (ref 5–15)
BUN: 19 mg/dL (ref 6–20)
CALCIUM: 9 mg/dL (ref 8.9–10.3)
CO2: 26 mmol/L (ref 22–32)
CREATININE: 1.06 mg/dL (ref 0.61–1.24)
Chloride: 99 mmol/L — ABNORMAL LOW (ref 101–111)
GLUCOSE: 119 mg/dL — AB (ref 65–99)
Potassium: 4.3 mmol/L (ref 3.5–5.1)
Sodium: 134 mmol/L — ABNORMAL LOW (ref 135–145)

## 2015-11-12 LAB — CARBOXYHEMOGLOBIN
CARBOXYHEMOGLOBIN: 1.9 % — AB (ref 0.5–1.5)
Methemoglobin: 0.6 % (ref 0.0–1.5)
O2 Saturation: 59 %
Total hemoglobin: 13.6 g/dL (ref 13.5–18.0)

## 2015-11-12 LAB — PROTIME-INR
INR: 1.57 — AB (ref 0.00–1.49)
PROTHROMBIN TIME: 18.8 s — AB (ref 11.6–15.2)

## 2015-11-12 LAB — CBC
HCT: 38.7 % — ABNORMAL LOW (ref 39.0–52.0)
HEMOGLOBIN: 12.6 g/dL — AB (ref 13.0–17.0)
MCH: 33.4 pg (ref 26.0–34.0)
MCHC: 32.6 g/dL (ref 30.0–36.0)
MCV: 102.7 fL — ABNORMAL HIGH (ref 78.0–100.0)
PLATELETS: 278 10*3/uL (ref 150–400)
RBC: 3.77 MIL/uL — AB (ref 4.22–5.81)
RDW: 14.1 % (ref 11.5–15.5)
WBC: 4.4 10*3/uL (ref 4.0–10.5)

## 2015-11-12 LAB — HEPARIN LEVEL (UNFRACTIONATED): HEPARIN UNFRACTIONATED: 0.6 [IU]/mL (ref 0.30–0.70)

## 2015-11-12 MED ORDER — WARFARIN SODIUM 7.5 MG PO TABS
7.5000 mg | ORAL_TABLET | Freq: Once | ORAL | Status: AC
  Administered 2015-11-12: 7.5 mg via ORAL
  Filled 2015-11-12: qty 1

## 2015-11-12 NOTE — Progress Notes (Signed)
CARDIAC REHAB PHASE I   Pt declined walk with cardiac rehab phase I. Encouraged pt to walk with RN or nursing staff before the end of the day and to walk 2x tomorrow. Pt agreed.  Tion Tse D Lachlyn Vanderstelt,MS,ACSM-RCEP 11/12/2015 12:32 PM

## 2015-11-12 NOTE — Progress Notes (Addendum)
Patient ID: Matthew Vaughan, male   DOB: 07-May-1956, 60 y.o.   MRN: [id number]   SUBJECTIVE:   Milrinone dropped to 51%. Restarted yesterday. Feels better. Co-ox 59%. CVP 5   LHC/RHC (11/07/15) Left Main  No significant coronary disease.      Left Anterior Descending  No significant coronary disease.     Ramus Intermedius  Moderate vessel, no significant coronary disease.     Left Circumflex  No significant coronary disease.     Right Coronary Artery  No significant coronary disease.      Wall Motion    Not done, possible LV thrombus.   RHC Procedural Findings (mmHg): Hemodynamics RA mean 22 RV 56/25 PA 57/34, mean 25 PCWP mean 35 AO 83/68  Oxygen saturations: PA 36% AO 100%  Cardiac Output (Fick) 2.72  Cardiac Index (Fick) 1.39  PVR 2.6 WU       Cardiac MRI (4/25): Severe LV dilation with EF 8%, small thrombus on mid anterior wall, mildly dilated RV with moderately decreased systolic function.  Patchy LGE pattern in inferior wall, ?prior myocarditis.   Scheduled Meds: . antiseptic oral rinse  7 mL Mouth Rinse BID  . digoxin  0.125 mg Oral Daily  . feeding supplement (ENSURE ENLIVE)  237 mL Oral BID BM  . lisinopril  2.5 mg Oral BID  . pneumococcal 23 valent vaccine  0.5 mL Intramuscular Tomorrow-1000  . sodium chloride flush  10-40 mL Intracatheter Q12H  . sodium chloride flush  3 mL Intravenous Q12H  . sodium chloride flush  3 mL Intravenous Q12H  . spironolactone  25 mg Oral Daily  . torsemide  20 mg Oral Daily  . Warfarin - Pharmacist Dosing Inpatient   Does not apply q1800   Continuous Infusions: . heparin 1,300 Units/hr (11/10/15 0700)  . milrinone 0.25 mcg/kg/min (11/11/15 1800)   PRN Meds:.sodium chloride, sodium chloride, acetaminophen, nitroGLYCERIN, ondansetron (ZOFRAN) IV, sodium chloride flush, sodium chloride flush, sodium chloride flush   Filed Vitals:   11/11/15 2000 11/11/15 2200 11/11/15 2330 11/12/15 0002  BP: 94/72 96/74  96/74 88/66  Pulse: 95 90 87 97  Temp:   97.8 F (36.6 C)   TempSrc:   Oral   Resp: 21 24 1 26   Height:      Weight:      SpO2: 97% 96% 97% 95%    Intake/Output Summary (Last 24 hours) at 11/12/15 0326 Last data filed at 11/12/15 0000  Gross per 24 hour  Intake 776.41 ml  Output   1860 ml  Net -1083.59 ml    LABS: Basic Metabolic Panel:  Recent Labs  40/98/11 1357 11/11/15 0320  NA 134* 135  K 4.0 4.5  CL 97* 100*  CO2 26 26  GLUCOSE 182* 112*  BUN 19 22*  CREATININE 1.17 1.12  CALCIUM 9.0 8.8*  MG 1.8 2.2   Liver Function Tests: No results for input(s): AST, ALT, ALKPHOS, BILITOT, PROT, ALBUMIN in the last 72 hours. No results for input(s): LIPASE, AMYLASE in the last 72 hours. CBC:  Recent Labs  11/10/15 0425 11/11/15 0320  WBC 4.8 4.8  HGB 12.3* 12.9*  HCT 37.2* 38.9*  MCV 100.0 101.6*  PLT 253 258   Cardiac Enzymes: No results for input(s): CKTOTAL, CKMB, CKMBINDEX, TROPONINI in the last 72 hours. BNP: Invalid input(s): POCBNP D-Dimer: No results for input(s): DDIMER in the last 72 hours. Hemoglobin A1C:  Recent Labs  11/09/15 0430  HGBA1C 6.3*   Fasting Lipid Panel:  Recent  Labs  11/09/15 0430  CHOL 123  HDL 47  LDLCALC 59  TRIG 85  CHOLHDL 2.6   Thyroid Function Tests:  Recent Labs  11/09/15 0430  TSH 1.208   Anemia Panel: No results for input(s): VITAMINB12, FOLATE, FERRITIN, TIBC, IRON, RETICCTPCT in the last 72 hours.  RADIOLOGY: Ct Abdomen Pelvis Wo Contrast  11/09/2015  CLINICAL DATA:  Preoperative evaluation, prior to surgical treatment of nonischemic cardiomyopathy and left ventricular thrombus. Initial encounter. EXAM: CT CHEST, ABDOMEN AND PELVIS WITHOUT CONTRAST TECHNIQUE: Multidetector CT imaging of the chest, abdomen and pelvis was performed following the standard protocol without IV contrast. COMPARISON:  None. FINDINGS: CT CHEST Patchy peripheral opacities within the right upper and middle lobes likely reflect  scarring, though mild infection could have a similar appearance. The lungs are otherwise grossly clear. No pleural effusion or pneumothorax is seen. No masses are identified. Minimal blebs are noted at the lung apices. The heart is mildly enlarged, with left ventricular enlargement. A right PICC is noted ending about the proximal SVC. No pericardial effusion is identified. The great vessels are grossly unremarkable in appearance. No mediastinal lymphadenopathy is seen. The visualized portions of thyroid gland are unremarkable. No axillary lymphadenopathy is seen. No acute osseous abnormalities are identified. CT ABDOMEN AND PELVIS The liver and spleen are unremarkable in appearance. The gallbladder is within normal limits. The pancreas and adrenal glands are unremarkable. The kidneys are unremarkable in appearance. There is no evidence of hydronephrosis. No renal or ureteral stones are seen. No perinephric stranding is appreciated. No free fluid is identified. The small bowel is unremarkable in appearance. The stomach is within normal limits. No acute vascular abnormalities are seen. The appendix is normal in caliber, without evidence of appendicitis. The colon is grossly unremarkable in appearance. The bladder is mildly distended and grossly remarkable. The prostate remains normal in size. No inguinal lymphadenopathy is seen. No acute osseous abnormalities are identified. IMPRESSION: 1. Patchy peripheral opacities within the right upper and middle lobes likely reflect scarring, though mild infection could have a similar appearance. 2. Mild cardiomegaly, with left ventricular enlargement. 3. No acute abnormality seen within the abdomen or pelvis. Electronically Signed   By: Roanna Raider M.D.   On: 11/09/2015 20:00   Dg Chest 2 View  11/04/2015  CLINICAL DATA:  Chest pain and shortness of breath for 1 day EXAM: CHEST  2 VIEW COMPARISON:  None. FINDINGS: Cardiac enlargement without vascular congestion. Focal area  of nodular consolidation in the right upper lung probably represents focal pneumonia. Soft tissue lesion is not excluded. Followup PA and lateral chest X-ray is recommended in 3-4 weeks following trial of antibiotic therapy to ensure resolution and exclude underlying malignancy. Left lung is clear. No blunting of costophrenic angles. No pneumothorax. Mediastinal contours appear intact. Degenerative changes in the spine and shoulders. IMPRESSION: Cardiac enlargement. Focal nodular consolidation at the right upper lung likely represent pneumonia. Followup PA and lateral chest X-ray is recommended in 3-4 weeks following trial of antibiotic therapy to ensure resolution and exclude underlying malignancy. Electronically Signed   By: Burman Nieves M.D.   On: 11/04/2015 23:00   Ct Chest Wo Contrast  11/09/2015  CLINICAL DATA:  Preoperative evaluation, prior to surgical treatment of nonischemic cardiomyopathy and left ventricular thrombus. Initial encounter. EXAM: CT CHEST, ABDOMEN AND PELVIS WITHOUT CONTRAST TECHNIQUE: Multidetector CT imaging of the chest, abdomen and pelvis was performed following the standard protocol without IV contrast. COMPARISON:  None. FINDINGS: CT CHEST Patchy peripheral opacities  within the right upper and middle lobes likely reflect scarring, though mild infection could have a similar appearance. The lungs are otherwise grossly clear. No pleural effusion or pneumothorax is seen. No masses are identified. Minimal blebs are noted at the lung apices. The heart is mildly enlarged, with left ventricular enlargement. A right PICC is noted ending about the proximal SVC. No pericardial effusion is identified. The great vessels are grossly unremarkable in appearance. No mediastinal lymphadenopathy is seen. The visualized portions of thyroid gland are unremarkable. No axillary lymphadenopathy is seen. No acute osseous abnormalities are identified. CT ABDOMEN AND PELVIS The liver and spleen are  unremarkable in appearance. The gallbladder is within normal limits. The pancreas and adrenal glands are unremarkable. The kidneys are unremarkable in appearance. There is no evidence of hydronephrosis. No renal or ureteral stones are seen. No perinephric stranding is appreciated. No free fluid is identified. The small bowel is unremarkable in appearance. The stomach is within normal limits. No acute vascular abnormalities are seen. The appendix is normal in caliber, without evidence of appendicitis. The colon is grossly unremarkable in appearance. The bladder is mildly distended and grossly remarkable. The prostate remains normal in size. No inguinal lymphadenopathy is seen. No acute osseous abnormalities are identified. IMPRESSION: 1. Patchy peripheral opacities within the right upper and middle lobes likely reflect scarring, though mild infection could have a similar appearance. 2. Mild cardiomegaly, with left ventricular enlargement. 3. No acute abnormality seen within the abdomen or pelvis. Electronically Signed   By: Roanna Raider M.D.   On: 11/09/2015 20:00   Mr Card Morphology Wo/w Cm  11/09/2015  CLINICAL DATA:  Nonischemic cardiomyopathy EXAM: CARDIAC MRI TECHNIQUE: The patient was scanned on a 1.5 Tesla GE magnet. A dedicated cardiac coil was used. Functional imaging was done using Fiesta sequences. 2,3, and 4 chamber views were done to assess for RWMA's. Modified Simpson's rule using a short axis stack was used to calculate an ejection fraction on a dedicated work [employer]. The patient received 25 cc of Multihance. After 10 minutes inversion recovery sequences were used to assess for infiltration and scar tissue. CONTRAST:  25 cc Multihance FINDINGS: Limited images of the lung fields showed no significant abnormalities. Markedly dilated left ventricle with relatively thin walls. Diffuse severe hypokinesis, EF 8%. There was a small LV thrombus adherent to the mid anterior wall.  The right ventricle was mildly dilated with moderately decreased systolic function. There was mild to moderate central mitral regurgitation. Trileaflet aortic valve with no significant stenosis or regurgitation. Moderate left atrial enlargement. Mild right atrial enlargement. On delayed enhancement imaging, there was patchy late gadolinium enhancement (LGE) in the inferior wall. There was a small area of full thickness LGE in the basal inferior wall. There was a small area of near-full thickness LGE in the mid inferior wall. Finally, there were a couple of small areas of < 25% thickness subendocardial LGE in the apical inferior wall. MEASUREMENTS: LVEDV 533 mL LV SV 42 mL LV EF 8% IMPRESSION: 1. Markedly dilated left ventricle with very severe diffuse hypokinesis, EF 8%. 2.  Mildly dilated RV with moderately decreased systolic function. 3.  Small LV mid anterior wall thrombus. 4. Patchy inferior LGE pattern as described above. This is of uncertain significance, could be related to prior myocarditis. Dalton Mclean Electronically Signed   By: [name] M.D.   On: 11/09/2015 00:43    PHYSICAL EXAM General: NAD. Sitting up in bedNeck: JVP 5 cm, no thyromegaly  or thyroid nodule.  Lungs: Clear to auscultation bilaterally with normal respiratory effort. CV: Lateral PMI.  Heart regular S1/S2, loud s3 no murmur.  No edema.   Abdomen: Soft, nontender, no hepatosplenomegaly, no distention.  Neurologic: Alert and oriented x 3.  Psych: Normal affect. Extremities: No clubbing or cyanosis.   TELEMETRY: Reviewed telemetry pt in NSR 80s  ASSESSMENT AND PLAN: 60 yo with history of nonischemic cardiomyopathy and apparently a prior LV thrombus presented with acute/chronic systolic CHF. He is followed by a cardiologist in [place].  1. Acute/chronic systolic CHF -> cardiogenic shock:  Nonischemic cardiomyopathy.  Cause uncertain => not a heavy drinker, had one sister with what sounds like sudden death.  Possible  viral myocarditis. EF has been very low per his report, do not have echo done yet here.  Had chest pain and elevated TnI at admission, so had LHC/RHC 4/24.  No coronary disease noted, no evidence for cardioembolism from LV thrombus either.  RHC showed markedly elevated filling pressures and low cardiac index at 1.39.  He was started on milrinone and IV Lasix.  Cardiac MRI was done, showing severe LV dilation with EF 8%, small LV thrombus, and mildly dilated RV with moderately decreased systolic function.  There was a patchy LGE pattern in the inferior wall, ?prior myocarditis.  HIV negative. - Milrinone restarted 4/28 now at 0.25. Co-ox 59% - Volume status ok. Continue po torsemide.  - Continue current digoxin, lisinopril, and spironolactone. - No beta blocker with low output.   - M-spike noted on SPEP. serum immunofixation negative. Does not look like cardiac amyloidosis on MRI.  - He has a LBBB and no ICD (pending Medicaid).  - At this point, Mr Parkerson is looking milrinone-dependent.  He has no device.  He has Medicaid pending.  We are proceeding with LVAD workup and he has been seen by surgeon. I would favor keeping him in house until VAD.  2. LV thrombus: Small thrombus on MRI.  On heparin/coumadin overlap, INR 1.5 yesterday. Will d/w VAD people. If medicaid approval soon. Will stop warfarin in expectation of VAD.   D/w Dr. Conception Chancy, Reuel Boom MD 11/12/2015 3:26 AM

## 2015-11-12 NOTE — Progress Notes (Signed)
ANTICOAGULATION CONSULT NOTE - Follow Up Consult  Pharmacy Consult for Heparin and Coumadin Indication: LV thrombus  No Known Allergies  Patient Measurements: Height: 5\' 11"  (180.3 cm) Weight: 156 lb 3.2 oz (70.852 kg) IBW/kg (Calculated) : 75.3  Vital Signs: Temp: 97.4 F (36.3 C) (04/29 0800) Temp Source: Oral (04/29 0800) BP: 90/68 mmHg (04/29 0600) Pulse Rate: 91 (04/29 0600)  Labs:  Recent Labs  11/10/15 0425 11/10/15 1200 11/10/15 1357 11/11/15 0320 11/11/15 0327 11/12/15 0315 11/12/15 0359  HGB 12.3*  --   --  12.9*  --   --  12.6*  HCT 37.2*  --   --  38.9*  --   --  38.7*  PLT 253  --   --  258  --   --  278  LABPROT 18.2*  --   --  18.2*  --   --  18.8*  INR 1.50*  --   --  1.51*  --   --  1.57*  HEPARINUNFRC 0.24* 0.42  --   --  0.45 0.60  --   CREATININE 1.07  --  1.17 1.12  --   --  1.06    Estimated Creatinine Clearance: 76.2 mL/min (by C-G formula based on Cr of 1.06).   Medications:  Heparin @ 1300 units/hr  Assessment: 60yom resumed on coumadin with heparin bridge after cath yesterday for his LV thrombus. Heparin level is now therapeutic at 0.6. INR is below goal 1.57 and slow to increase. CBC stable. No bleeding reported.  PTA warfarin 2.5mg  daily  Goal of Therapy:  INR 2-3 Heparin level 0.3-0.7 units/ml Monitor platelets by anticoagulation protocol: Yes   Plan:  1) Continue heparin at 1300 units/hr 2) Increase dose of coumadin 7.5mg  x 1 3) Daily heparin level, INR, CBC   Sherron Monday, PharmD Clinical Pharmacy Resident Pager: [phone] 11/12/2015 9:06 AM

## 2015-11-12 NOTE — Progress Notes (Signed)
CARDIAC REHAB PHASE I   PRE:  Rate/Rhythm: 98 LBBB  BP:   Sitting: 89/71 sitting   99/86  standing     SaO2: 98% RA  MODE:  Ambulation: 1040 ft   POST:  Rate/Rhythm: 121 Sinus Tachy  BP:   Sitting: 125/102  3 minutes later 98/72     SaO2: 92% RA 250-315  Pt ambulated 1068ft, with IV pole, independently. Pt maintained a steady pace and solid gait. Pt was eager to get moving after lunch. Encouraged pt to get in another walk with nursing staff before bedtime, and 3 walks altogether tomorrow. Pt agreed. Returned pt to bed with VSS. Call bell in reach. Reported no signs of CP, dizziness, or lightheadedness. Very pleasant man.  Raciel Caffrey D Emiko Osorto,MS,ACSM-RCEP 11/12/2015 3:10 PM  \

## 2015-11-13 LAB — CBC
HEMATOCRIT: 27.4 % — AB (ref 39.0–52.0)
HEMATOCRIT: 40 % (ref 39.0–52.0)
HEMOGLOBIN: 9 g/dL — AB (ref 13.0–17.0)
Hemoglobin: 13 g/dL (ref 13.0–17.0)
MCH: 34.1 pg — AB (ref 26.0–34.0)
MCH: 33.9 pg (ref 26.0–34.0)
MCHC: 32.8 g/dL (ref 30.0–36.0)
MCHC: 32.5 g/dL (ref 30.0–36.0)
MCV: 103.8 fL — AB (ref 78.0–100.0)
MCV: 104.4 fL — AB (ref 78.0–100.0)
PLATELETS: 185 10*3/uL (ref 150–400)
PLATELETS: 294 10*3/uL (ref 150–400)
RBC: 2.64 MIL/uL — AB (ref 4.22–5.81)
RBC: 3.83 MIL/uL — ABNORMAL LOW (ref 4.22–5.81)
RDW: 14.2 % (ref 11.5–15.5)
RDW: 14.2 % (ref 11.5–15.5)
WBC: 3.3 10*3/uL — AB (ref 4.0–10.5)
WBC: 4.5 10*3/uL (ref 4.0–10.5)

## 2015-11-13 LAB — TYPE AND SCREEN
ABO/RH(D): A POS
ANTIBODY SCREEN: NEGATIVE

## 2015-11-13 LAB — BASIC METABOLIC PANEL
ANION GAP: 6 (ref 5–15)
BUN: 17 mg/dL (ref 6–20)
CHLORIDE: 102 mmol/L (ref 101–111)
CO2: 26 mmol/L (ref 22–32)
CREATININE: 0.85 mg/dL (ref 0.61–1.24)
Calcium: 8.3 mg/dL — ABNORMAL LOW (ref 8.9–10.3)
GFR calc non Af Amer: >60 mL/min (ref >60)
Glucose, Bld: 107 mg/dL — ABNORMAL HIGH (ref 65–99)
POTASSIUM: 4.1 mmol/L (ref 3.5–5.1)
SODIUM: 134 mmol/L — AB (ref 135–145)

## 2015-11-13 LAB — PROTIME-INR
INR: 1.98 — ABNORMAL HIGH (ref 0.00–1.49)
Prothrombin Time: 22.4 seconds — ABNORMAL HIGH (ref 11.6–15.2)

## 2015-11-13 LAB — CARBOXYHEMOGLOBIN
CARBOXYHEMOGLOBIN: 2.1 % — AB (ref 0.5–1.5)
Methemoglobin: 0.5 % (ref 0.0–1.5)
O2 SAT: 75 %
TOTAL HEMOGLOBIN: 7.9 g/dL — AB (ref 13.5–18.0)

## 2015-11-13 LAB — HEPARIN LEVEL (UNFRACTIONATED)
HEPARIN UNFRACTIONATED: 0.66 [IU]/mL (ref 0.30–0.70)
Heparin Unfractionated: 0.23 IU/mL — ABNORMAL LOW (ref 0.30–0.70)

## 2015-11-13 MED ORDER — HEPARIN BOLUS VIA INFUSION
1000.0000 [IU] | Freq: Once | INTRAVENOUS | Status: AC
  Administered 2015-11-13: 1000 [IU] via INTRAVENOUS
  Filled 2015-11-13: qty 1000

## 2015-11-13 MED ORDER — WARFARIN SODIUM 5 MG PO TABS
5.0000 mg | ORAL_TABLET | Freq: Once | ORAL | Status: AC
  Administered 2015-11-13: 5 mg via ORAL
  Filled 2015-11-13: qty 1

## 2015-11-13 NOTE — Progress Notes (Signed)
Patient ID: Matthew Vaughan, male   DOB: 16-Jun-1956, 60 y.o.   MRN: [id number]   SUBJECTIVE:   Milrinone dropped to 51% on 4/28. Restarted 4/29. Still feels weak. Says he can't tell a difference.  Co-ox 75% (?). CVP 4. BP soft in 80-90s.   Hgb 7.9 on co-ox. 9.0 on CBC. Denies bleeding. Stools brown.    LHC/RHC (11/07/15) Left Main  No significant coronary disease.      Left Anterior Descending  No significant coronary disease.     Ramus Intermedius  Moderate vessel, no significant coronary disease.     Left Circumflex  No significant coronary disease.     Right Coronary Artery  No significant coronary disease.      Wall Motion    Not done, possible LV thrombus.   RHC Procedural Findings (mmHg): Hemodynamics RA mean 22 RV 56/25 PA 57/34, mean 25 PCWP mean 35 AO 83/68  Oxygen saturations: PA 36% AO 100%  Cardiac Output (Fick) 2.72  Cardiac Index (Fick) 1.39  PVR 2.6 WU       Cardiac MRI (4/25): Severe LV dilation with EF 8%, small thrombus on mid anterior wall, mildly dilated RV with moderately decreased systolic function.  Patchy LGE pattern in inferior wall, ?prior myocarditis.   Scheduled Meds: . digoxin  0.125 mg Oral Daily  . feeding supplement (ENSURE ENLIVE)  237 mL Oral BID BM  . lisinopril  2.5 mg Oral BID  . sodium chloride flush  10-40 mL Intracatheter Q12H  . sodium chloride flush  3 mL Intravenous Q12H  . sodium chloride flush  3 mL Intravenous Q12H  . spironolactone  25 mg Oral Daily  . torsemide  20 mg Oral Daily  . warfarin  5 mg Oral ONCE-1800  . Warfarin - Pharmacist Dosing Inpatient   Does not apply q1800   Continuous Infusions: . heparin 1,450 Units/hr (11/13/15 0845)  . milrinone 0.25 mcg/kg/min (11/13/15 0846)   PRN Meds:.sodium chloride, sodium chloride, acetaminophen, nitroGLYCERIN, ondansetron (ZOFRAN) IV, sodium chloride flush, sodium chloride flush, sodium chloride flush   Filed Vitals:   11/13/15 0525 11/13/15 0600  11/13/15 0800 11/13/15 1318  BP: 93/72 102/72 91/64 86/62   Pulse: 95 121 98 98  Temp:   98.9 F (37.2 C) 97.6 F (36.4 C)  TempSrc:   Oral Oral  Resp: Height:      Weight:  69.945 kg (154 lb 3.2 oz)    SpO2: 98% 96% 97% 99%    Intake/Output Summary (Last 24 hours) at 11/13/15 1538 Last data filed at 11/13/15 1300  Gross per 24 hour  Intake 930.18 ml  Output   1275 ml  Net -344.82 ml    LABS: Basic Metabolic Panel:  Recent Labs  78/29/56 0320 11/12/15 0359 11/13/15 0500  NA 135 134* 134*  K 4.5 4.3 4.1  CL 100* 99* 102  CO2 GLUCOSE 112* 119* 107*  BUN 22* 19 17  CREATININE 1.12 1.06 0.85  CALCIUM 8.8* 9.0 8.3*  MG 2.2  --   --    Liver Function Tests: No results for input(s): AST, ALT, ALKPHOS, BILITOT, PROT, ALBUMIN in the last 72 hours. No results for input(s): LIPASE, AMYLASE in the last 72 hours. CBC:  Recent Labs  11/12/15 0359 11/13/15 0500  WBC 4.4 3.3*  HGB 12.6* 9.0*  HCT 38.7* 27.4*  MCV 102.7* 103.8*  PLT 278 185   Cardiac Enzymes: No results for input(s): CKTOTAL, CKMB, CKMBINDEX, TROPONINI  in the last 72 hours. BNP: Invalid input(s): POCBNP D-Dimer: No results for input(s): DDIMER in the last 72 hours. Hemoglobin A1C: No results for input(s): HGBA1C in the last 72 hours. Fasting Lipid Panel: No results for input(s): CHOL, HDL, LDLCALC, TRIG, CHOLHDL, LDLDIRECT in the last 72 hours. Thyroid Function Tests: No results for input(s): TSH, T4TOTAL, T3FREE, THYROIDAB in the last 72 hours.  Invalid input(s): FREET3 Anemia Panel: No results for input(s): VITAMINB12, FOLATE, FERRITIN, TIBC, IRON, RETICCTPCT in the last 72 hours.  RADIOLOGY: Ct Abdomen Pelvis Wo Contrast  11/09/2015  CLINICAL DATA:  Preoperative evaluation, prior to surgical treatment of nonischemic cardiomyopathy and left ventricular thrombus. Initial encounter. EXAM: CT CHEST, ABDOMEN AND PELVIS WITHOUT CONTRAST TECHNIQUE: Multidetector CT imaging of  the chest, abdomen and pelvis was performed following the standard protocol without IV contrast. COMPARISON:  None. FINDINGS: CT CHEST Patchy peripheral opacities within the right upper and middle lobes likely reflect scarring, though mild infection could have a similar appearance. The lungs are otherwise grossly clear. No pleural effusion or pneumothorax is seen. No masses are identified. Minimal blebs are noted at the lung apices. The heart is mildly enlarged, with left ventricular enlargement. A right PICC is noted ending about the proximal SVC. No pericardial effusion is identified. The great vessels are grossly unremarkable in appearance. No mediastinal lymphadenopathy is seen. The visualized portions of thyroid gland are unremarkable. No axillary lymphadenopathy is seen. No acute osseous abnormalities are identified. CT ABDOMEN AND PELVIS The liver and spleen are unremarkable in appearance. The gallbladder is within normal limits. The pancreas and adrenal glands are unremarkable. The kidneys are unremarkable in appearance. There is no evidence of hydronephrosis. No renal or ureteral stones are seen. No perinephric stranding is appreciated. No free fluid is identified. The small bowel is unremarkable in appearance. The stomach is within normal limits. No acute vascular abnormalities are seen. The appendix is normal in caliber, without evidence of appendicitis. The colon is grossly unremarkable in appearance. The bladder is mildly distended and grossly remarkable. The prostate remains normal in size. No inguinal lymphadenopathy is seen. No acute osseous abnormalities are identified. IMPRESSION: 1. Patchy peripheral opacities within the right upper and middle lobes likely reflect scarring, though mild infection could have a similar appearance. 2. Mild cardiomegaly, with left ventricular enlargement. 3. No acute abnormality seen within the abdomen or pelvis. Electronically Signed   By: Roanna Raider M.D.   On:  11/09/2015 20:00   Dg Chest 2 View  11/04/2015  CLINICAL DATA:  Chest pain and shortness of breath for 1 day EXAM: CHEST  2 VIEW COMPARISON:  None. FINDINGS: Cardiac enlargement without vascular congestion. Focal area of nodular consolidation in the right upper lung probably represents focal pneumonia. Soft tissue lesion is not excluded. Followup PA and lateral chest X-ray is recommended in 3-4 weeks following trial of antibiotic therapy to ensure resolution and exclude underlying malignancy. Left lung is clear. No blunting of costophrenic angles. No pneumothorax. Mediastinal contours appear intact. Degenerative changes in the spine and shoulders. IMPRESSION: Cardiac enlargement. Focal nodular consolidation at the right upper lung likely represent pneumonia. Followup PA and lateral chest X-ray is recommended in 3-4 weeks following trial of antibiotic therapy to ensure resolution and exclude underlying malignancy. Electronically Signed   By: Burman Nieves M.D.   On: 11/04/2015 23:00   Ct Chest Wo Contrast  11/09/2015  CLINICAL DATA:  Preoperative evaluation, prior to surgical treatment of nonischemic cardiomyopathy and left ventricular thrombus. Initial encounter. EXAM:  CT CHEST, ABDOMEN AND PELVIS WITHOUT CONTRAST TECHNIQUE: Multidetector CT imaging of the chest, abdomen and pelvis was performed following the standard protocol without IV contrast. COMPARISON:  None. FINDINGS: CT CHEST Patchy peripheral opacities within the right upper and middle lobes likely reflect scarring, though mild infection could have a similar appearance. The lungs are otherwise grossly clear. No pleural effusion or pneumothorax is seen. No masses are identified. Minimal blebs are noted at the lung apices. The heart is mildly enlarged, with left ventricular enlargement. A right PICC is noted ending about the proximal SVC. No pericardial effusion is identified. The great vessels are grossly unremarkable in appearance. No mediastinal  lymphadenopathy is seen. The visualized portions of thyroid gland are unremarkable. No axillary lymphadenopathy is seen. No acute osseous abnormalities are identified. CT ABDOMEN AND PELVIS The liver and spleen are unremarkable in appearance. The gallbladder is within normal limits. The pancreas and adrenal glands are unremarkable. The kidneys are unremarkable in appearance. There is no evidence of hydronephrosis. No renal or ureteral stones are seen. No perinephric stranding is appreciated. No free fluid is identified. The small bowel is unremarkable in appearance. The stomach is within normal limits. No acute vascular abnormalities are seen. The appendix is normal in caliber, without evidence of appendicitis. The colon is grossly unremarkable in appearance. The bladder is mildly distended and grossly remarkable. The prostate remains normal in size. No inguinal lymphadenopathy is seen. No acute osseous abnormalities are identified. IMPRESSION: 1. Patchy peripheral opacities within the right upper and middle lobes likely reflect scarring, though mild infection could have a similar appearance. 2. Mild cardiomegaly, with left ventricular enlargement. 3. No acute abnormality seen within the abdomen or pelvis. Electronically Signed   By: Roanna Raider M.D.   On: 11/09/2015 20:00   Mr Card Morphology Wo/w Cm  11/09/2015  CLINICAL DATA:  Nonischemic cardiomyopathy EXAM: CARDIAC MRI TECHNIQUE: The patient was scanned on a 1.5 Tesla GE magnet. A dedicated cardiac coil was used. Functional imaging was done using Fiesta sequences. 2,3, and 4 chamber views were done to assess for RWMA's. Modified Simpson's rule using a short axis stack was used to calculate an ejection fraction on a dedicated work [employer]. The patient received 25 cc of Multihance. After 10 minutes inversion recovery sequences were used to assess for infiltration and scar tissue. CONTRAST:  25 cc Multihance FINDINGS: Limited images of  the lung fields showed no significant abnormalities. Markedly dilated left ventricle with relatively thin walls. Diffuse severe hypokinesis, EF 8%. There was a small LV thrombus adherent to the mid anterior wall. The right ventricle was mildly dilated with moderately decreased systolic function. There was mild to moderate central mitral regurgitation. Trileaflet aortic valve with no significant stenosis or regurgitation. Moderate left atrial enlargement. Mild right atrial enlargement. On delayed enhancement imaging, there was patchy late gadolinium enhancement (LGE) in the inferior wall. There was a small area of full thickness LGE in the basal inferior wall. There was a small area of near-full thickness LGE in the mid inferior wall. Finally, there were a couple of small areas of < 25% thickness subendocardial LGE in the apical inferior wall. MEASUREMENTS: LVEDV 533 mL LV SV 42 mL LV EF 8% IMPRESSION: 1. Markedly dilated left ventricle with very severe diffuse hypokinesis, EF 8%. 2.  Mildly dilated RV with moderately decreased systolic function. 3.  Small LV mid anterior wall thrombus. 4. Patchy inferior LGE pattern as described above. This is of uncertain significance, could be related  to prior myocarditis. Dalton Mclean Electronically Signed   By: [name] M.D.   On: 11/09/2015 00:43    PHYSICAL EXAM General: NAD. Sitting in chair.  Neck: JVP flat, no thyromegaly or thyroid nodule.  Lungs: Clear to auscultation bilaterally with normal respiratory effort. CV: Lateral PMI.  Heart regular S1/S2, loud s3 no murmur.  No edema.   Abdomen: Soft, nontender, no hepatosplenomegaly, no distention.  Neurologic: Alert and oriented x 3.  Psych: Normal affect. Extremities: No clubbing or cyanosis.   TELEMETRY: Reviewed telemetry pt in NSR 90-100s  ASSESSMENT AND PLAN: 60 yo with history of nonischemic cardiomyopathy and apparently a prior LV thrombus presented with acute/chronic systolic CHF. He is followed  by a cardiologist in [place].  1. Acute/chronic systolic CHF -> cardiogenic shock:  Nonischemic cardiomyopathy.  Cause uncertain => not a heavy drinker, had one sister with what sounds like sudden death.  Possible viral myocarditis. EF has been very low per his report, do not have echo done yet here.  Had chest pain and elevated TnI at admission, so had LHC/RHC 4/24.  No coronary disease noted, no evidence for cardioembolism from LV thrombus either.  RHC showed markedly elevated filling pressures and low cardiac index at 1.39.  He was started on milrinone and IV Lasix.  Cardiac MRI was done, showing severe LV dilation with EF 8%, small LV thrombus, and mildly dilated RV with moderately decreased systolic function.  There was a patchy LGE pattern in the inferior wall, ?prior myocarditis.  HIV negative. - Milrinone restarted 4/28 now at 0.25. Co-ox 75% - Volume status ok. Continue po torsemide. May need to drop back to every other day with CVP 3-4 and soft BP.  - Continue current digoxin, lisinopril, and spironolactone. - No beta blocker with low output.   - M-spike noted on SPEP. serum immunofixation negative. Does not look like cardiac amyloidosis on MRI.  - He has a LBBB and no ICD (pending Medicaid).  - At this point, Mr Douty is looking milrinone-dependent.  He has no device.  He has Medicaid pending.  We are proceeding with LVAD workup and he has been seen by surgeon. I would favor keeping him in house until VAD.  2. LV thrombus: Small thrombus on MRI.  On heparin/coumadin overlap, INR 1.98. Will stop heparin.  3. Anemia -unclear source. Repeat CBC and get Type and screen. Start protonix  D/w Dr. Randal Buba MD 11/13/2015 3:38 PM

## 2015-11-13 NOTE — Progress Notes (Signed)
ANTICOAGULATION CONSULT NOTE - Follow Up Consult  Pharmacy Consult for Heparin and Coumadin Indication: LV thrombus  No Known Allergies  Patient Measurements: Height: 5\' 11"  (180.3 cm) Weight: 154 lb 3.2 oz (69.945 kg) IBW/kg (Calculated) : 75.3  Vital Signs: Temp: 98.3 F (36.8 C) (04/30 0026) Temp Source: Oral (04/30 0026) BP: 102/72 mmHg (04/30 0600) Pulse Rate: 121 (04/30 0600)  Labs:  Recent Labs  11/11/15 0320 11/11/15 0327 11/12/15 0315 11/12/15 0359 11/13/15 0500  HGB 12.9*  --   --  12.6* 9.0*  HCT 38.9*  --   --  38.7* 27.4*  PLT 258  --   --  278 185  LABPROT 18.2*  --   --  18.8* 22.4*  INR 1.51*  --   --  1.57* 1.98*  HEPARINUNFRC  --  0.45 0.60  --  0.23*  CREATININE 1.12  --   --  1.06 0.85    Estimated Creatinine Clearance: 93.7 mL/min (by C-G formula based on Cr of 0.85).   Medications:  Heparin @ 1300 units/hr  Assessment: 60yom resumed on coumadin with heparin bridge after cath yesterday for his LV thrombus. Heparin level is now sub-therapeutic at 0.23. INR is slightly below goal 1.98. CBC stable. No bleeding or issues per line reported by nurse.  PTA warfarin 2.5mg  daily  Goal of Therapy:  INR 2-3 Heparin level 0.3-0.7 units/ml Monitor platelets by anticoagulation protocol: Yes   Plan:  1) Heparin bolus of 1000 units then increase heparin infusion to 1450 units/hr 2) Coumadin 5mg  x 1 3) 6 hour heparin level 4) Daily heparin level, INR, CBC   Sherron Monday, PharmD Clinical Pharmacy Resident Pager: [phone] 11/13/2015 8:46 AM

## 2015-11-13 NOTE — Progress Notes (Signed)
ANTICOAGULATION CONSULT NOTE - Follow Up Consult  Pharmacy Consult for Heparin  Indication: LV thrombus  No Known Allergies  Patient Measurements: Height: 5\' 11"  (180.3 cm) Weight: 154 lb 3.2 oz (69.945 kg) IBW/kg (Calculated) : 75.3  Vital Signs: Temp: 97.4 F (36.3 C) (04/30 1610) Temp Source: Oral (04/30 1610) BP: 86/69 mmHg (04/30 1610) Pulse Rate: 97 (04/30 1610)  Labs:  Recent Labs  11/11/15 0320  11/12/15 0315 11/12/15 0359 11/13/15 0500 11/13/15 1513  HGB 12.9*  --   --  12.6* 9.0*  --   HCT 38.9*  --   --  38.7* 27.4*  --   PLT 258  --   --  278 185  --   LABPROT 18.2*  --   --  18.8* 22.4*  --   INR 1.51*  --   --  1.57* 1.98*  --   HEPARINUNFRC  --   < > 0.60  --  0.23* 0.66  CREATININE 1.12  --   --  1.06 0.85  --   < > = values in this interval not displayed.  Estimated Creatinine Clearance: 93.7 mL/min (by C-G formula based on Cr of 0.85).   Medications:  Heparin @ 1350units/hr  Assessment: 60yom resumed on coumadin with heparin bridge after cath for his LV thrombus. Heparin is now at goal (HL= 0.66) on 1350 units/hr  PTA warfarin 2.5mg  daily  Goal of Therapy:  INR 2-3 Heparin level 0.3-0.7 units/ml Monitor platelets by anticoagulation protocol: Yes   Plan:  No heparin changes needed Confirm a heparin level later today Daily heparin level, INR, CBC  Harland German, Pharm D 11/13/2015 6:11 PM

## 2015-11-14 ENCOUNTER — Telehealth: Payer: Self-pay | Admitting: Licensed Clinical Social Worker

## 2015-11-14 LAB — CARBOXYHEMOGLOBIN
Carboxyhemoglobin: 2.4 % — ABNORMAL HIGH (ref 0.5–1.5)
Methemoglobin: 0.6 % (ref 0.0–1.5)
O2 Saturation: 70 %
TOTAL HEMOGLOBIN: 12.7 g/dL — AB (ref 13.5–18.0)

## 2015-11-14 LAB — PROTIME-INR
INR: 1.67 — AB (ref 0.00–1.49)
PROTHROMBIN TIME: 19.7 s — AB (ref 11.6–15.2)

## 2015-11-14 LAB — CBC
HCT: 37.7 % — ABNORMAL LOW (ref 39.0–52.0)
Hemoglobin: 12.3 g/dL — ABNORMAL LOW (ref 13.0–17.0)
MCH: 33.9 pg (ref 26.0–34.0)
MCHC: 32.6 g/dL (ref 30.0–36.0)
MCV: 103.9 fL — ABNORMAL HIGH (ref 78.0–100.0)
PLATELETS: 285 10*3/uL (ref 150–400)
RBC: 3.63 MIL/uL — ABNORMAL LOW (ref 4.22–5.81)
RDW: 14.1 % (ref 11.5–15.5)
WBC: 4.1 10*3/uL (ref 4.0–10.5)

## 2015-11-14 LAB — BASIC METABOLIC PANEL
ANION GAP: 7 (ref 5–15)
BUN: 16 mg/dL (ref 6–20)
CHLORIDE: 98 mmol/L — AB (ref 101–111)
CO2: 26 mmol/L (ref 22–32)
Calcium: 9.1 mg/dL (ref 8.9–10.3)
Creatinine, Ser: 1.01 mg/dL (ref 0.61–1.24)
GFR calc non Af Amer: >60 mL/min (ref >60)
Glucose, Bld: 97 mg/dL (ref 65–99)
Potassium: 4.2 mmol/L (ref 3.5–5.1)
SODIUM: 131 mmol/L — AB (ref 135–145)

## 2015-11-14 LAB — HEPARIN LEVEL (UNFRACTIONATED)
Heparin Unfractionated: <0.1 IU/mL — ABNORMAL LOW (ref 0.30–0.70)
Heparin Unfractionated: 1.02 IU/mL — ABNORMAL HIGH (ref 0.30–0.70)
Heparin Unfractionated: 0.53 IU/mL (ref 0.30–0.70)

## 2015-11-14 MED ORDER — HEPARIN (PORCINE) IN NACL 100-0.45 UNIT/ML-% IJ SOLN
1250.0000 [IU]/h | INTRAMUSCULAR | Status: DC
  Administered 2015-11-14 – 2015-11-15 (×2): 1400 [IU]/h via INTRAVENOUS
  Filled 2015-11-14 (×3): qty 250

## 2015-11-14 NOTE — Progress Notes (Addendum)
ANTICOAGULATION CONSULT NOTE - Follow Up Consult  Pharmacy Consult for Heparin Indication: LV thrombus  No Known Allergies  Patient Measurements: Height: 5\' 11"  (180.3 cm) Weight: 157 lb 3.2 oz (71.305 kg) IBW/kg (Calculated) : 75.3  Vital Signs: Temp: 98.3 F (36.8 C) (05/01 1700) Temp Source: Oral (05/01 1700) BP: 79/61 mmHg (05/01 1700) Pulse Rate: 97 (05/01 1131)  Labs:  Recent Labs  11/12/15 0359 11/13/15 0500 11/13/15 1513 11/13/15 1930 11/14/15 0500 11/14/15 0600 11/14/15 1110 11/14/15 1645  HGB 12.6* 9.0*  --  13.0 12.3*  --   --   --   HCT 38.7* 27.4*  --  40.0 37.7*  --   --   --   PLT 278 185  --  294 285  --   --   --   LABPROT 18.8* 22.4*  --   --  19.7*  --   --   --   INR 1.57* 1.98*  --   --  1.67*  --   --   --   HEPARINUNFRC  --  0.23* 0.66  --   --  <0.10*  --  1.02*  CREATININE 1.06 0.85  --   --   --   --  1.01  --     Estimated Creatinine Clearance: 80.4 mL/min (by C-G formula based on Cr of 1.01).  Assessment: 60 yom on heparin drip after cath for LV thrombus. Also awaiting LVAD placement this admit, warfarin on hold.     HL 1.02 and appears supratherapeutic however spoke with RN and heparin is infusing through PICC. He stopped heparin infusion for min and then drew HL. It makes sense HL is falsely elevated. Called phlebotomy and asked them to draw a STAT HL via peripheral stick. No bleeding noted.  Goal of Therapy:  Heparin level 0.3-0.7 units/ml Monitor platelets by anticoagulation protocol: Yes   Plan:  Continue heparin drip 1400 units/hr  - no bolus STAT HL  [place], [address].D., BCPS Clinical Pharmacist Pager: [phone] 11/14/2015 6:15 PM   Addendum: Repeat HL is therapeutic at 0.53 on 1400 units/hr (via peripheral stick). Spoke with RN earlier and heparin drip was switched to peripheral IV so that HLs can be drawn via PICC next time.  Continue heparin drip at 1400 units/hr Daily HL and CBC  [facility], [address].D.,  BCPS Clinical Pharmacist Pager: [phone] 11/14/2015 7:23 PM

## 2015-11-14 NOTE — Progress Notes (Signed)
CVP reading of 0 verified with Ihor Gully, RN.

## 2015-11-14 NOTE — Progress Notes (Signed)
ANTICOAGULATION CONSULT NOTE - Follow Up Consult  Pharmacy Consult for Heparin Indication: LV thrombus  No Known Allergies  Patient Measurements: Height: 5\' 11"  (180.3 cm) Weight: 157 lb 3.2 oz (71.305 kg) IBW/kg (Calculated) : 75.3  Vital Signs: Temp: 97.9 F (36.6 C) (05/01 0350) Temp Source: Oral (05/01 0350) BP: 84/64 mmHg (05/01 0350) Pulse Rate: 94 (05/01 0135)  Labs:  Recent Labs  11/12/15 0359 11/13/15 0500 11/13/15 1513 11/13/15 1930 11/14/15 0500 11/14/15 0600  HGB 12.6* 9.0*  --  13.0 12.3*  --   HCT 38.7* 27.4*  --  40.0 37.7*  --   PLT 278 185  --  294 285  --   LABPROT 18.8* 22.4*  --   --  19.7*  --   INR 1.57* 1.98*  --   --  1.67*  --   HEPARINUNFRC  --  0.23* 0.66  --   --  <0.10*  CREATININE 1.06 0.85  --   --   --   --     Estimated Creatinine Clearance: 95.5 mL/min (by C-G formula based on Cr of 0.85).     Assessment: 60yom resumed on coumadin with heparin bridge after cath for LV thrombus.  INR was 1.98 4/30 and drop in h/h in aml so heparin stopped.  Recheck h/h stable at 13/40 and remains stable today.  No bleeding. INR fell 1.67 despite warfarin dose las pm. Now will stop warfarin and restart heparin drip in preparation for LVAD placement.      Goal of Therapy:  INR 2-3 Heparin level 0.3-0.7 units/ml Monitor platelets by anticoagulation protocol: Yes   Plan:  Stop warfarin  Check INR daily for now Restart heparin drip 1400 uts/hr  - no bolus HL in 6hr and daily   Leota Sauers Pharm.D. CPP, BCPS Clinical Pharmacist [phone] 11/14/2015 7:38 AM

## 2015-11-14 NOTE — Progress Notes (Signed)
CARDIAC REHAB PHASE I   PRE:  Rate/Rhythm: 87 SR    BP: sitting 86/57    SaO2: 100 RA  MODE:  Ambulation: 1050 ft   POST:  Rate/Rhythm: 111 ST    BP: sitting 75/65, recheck 83/63     SaO2: 97 RA  Pt eager to walk. Quick pace, SOB after walking 1050 ft independently. BP decreased and generally low today. No dizziness per pt. Pt sts he is processing the plan for LVAD and is eager to stay motivated/driven. Discussed with him sternal precautions, IS, mobility, and d/c planning. Pt will be eager to be independent from his family as soon as possible. 1601-0932   Harriet Masson CES, ACSM 11/14/2015 2:48 PM

## 2015-11-14 NOTE — Progress Notes (Signed)
Patient ID: Matthew Vaughan, male   DOB: [birth year], 60 y.o.   MRN: [id number]   SUBJECTIVE:   Milrinone dropped to 51% on 4/28. Restarted 4/29. Todays CO-OX on milrinone 0.25 mcg is 70%.  Hgb 12.3. Able to walk around the unit.   Denies SOB/dizziness.    LHC/RHC (11/07/15) Left Main  No significant coronary disease.      Left Anterior Descending  No significant coronary disease.     Ramus Intermedius  Moderate vessel, no significant coronary disease.     Left Circumflex  No significant coronary disease.     Right Coronary Artery  No significant coronary disease.      Wall Motion    Not done, possible LV thrombus.   RHC Procedural Findings (mmHg): Hemodynamics RA mean 22 RV 56/25 PA 57/34, mean 25 PCWP mean 35 AO 83/68  Oxygen saturations: PA 36% AO 100%  Cardiac Output (Fick) 2.72  Cardiac Index (Fick) 1.39  PVR 2.6 WU       Cardiac MRI (4/25): Severe LV dilation with EF 8%, small thrombus on mid anterior wall, mildly dilated RV with moderately decreased systolic function.  Patchy LGE pattern in inferior wall, ?prior myocarditis.   Scheduled Meds: . digoxin  0.125 mg Oral Daily  . feeding supplement (ENSURE ENLIVE)  237 mL Oral BID BM  . lisinopril  2.5 mg Oral BID  . sodium chloride flush  10-40 mL Intracatheter Q12H  . sodium chloride flush  3 mL Intravenous Q12H  . sodium chloride flush  3 mL Intravenous Q12H  . spironolactone  25 mg Oral Daily  . torsemide  20 mg Oral Daily   Continuous Infusions: . heparin    . milrinone 0.25 mcg/kg/min (11/14/15 0600)   PRN Meds:.sodium chloride, sodium chloride, acetaminophen, nitroGLYCERIN, ondansetron (ZOFRAN) IV, sodium chloride flush, sodium chloride flush, sodium chloride flush   Filed Vitals:   11/13/15 2349 11/14/15 0130 11/14/15 0135 11/14/15 0350  BP: 84/64  Pulse: 98 94 94   Temp: 97.7 F (36.5 C)   97.9 F (36.6 C)  TempSrc: Oral   Oral  Resp: Height:        Weight:    157 lb 3.2 oz (71.305 kg)  SpO2: 99% 99% 99% 98%    Intake/Output Summary (Last 24 hours) at 11/14/15 0801 Last data filed at 11/14/15 0329  Gross per 24 hour  Intake 1760.29 ml  Output   1225 ml  Net 535.29 ml    LABS: Basic Metabolic Panel:  Recent Labs  19/14/78 0359 11/13/15 0500  NA 134* 134*  K 4.3 4.1  CL 99* 102  CO2 26 26  GLUCOSE 119* 107*  BUN 19 17  CREATININE 1.06 0.85  CALCIUM 9.0 8.3*   Liver Function Tests: No results for input(s): AST, ALT, ALKPHOS, BILITOT, PROT, ALBUMIN in the last 72 hours. No results for input(s): LIPASE, AMYLASE in the last 72 hours. CBC:  Recent Labs  11/13/15 1930 11/14/15 0500  WBC 4.5 4.1  HGB 13.0 12.3*  HCT 40.0 37.7*  MCV 104.4* 103.9*  PLT 294 285   Cardiac Enzymes: No results for input(s): CKTOTAL, CKMB, CKMBINDEX, TROPONINI in the last 72 hours. BNP: Invalid input(s): POCBNP D-Dimer: No results for input(s): DDIMER in the last 72 hours. Hemoglobin A1C: No results for input(s): HGBA1C in the last 72 hours. Fasting Lipid Panel: No results for input(s): CHOL, HDL, LDLCALC, TRIG, CHOLHDL, LDLDIRECT in the last 72 hours. Thyroid Function  Tests: No results for input(s): TSH, T4TOTAL, T3FREE, THYROIDAB in the last 72 hours.  Invalid input(s): FREET3 Anemia Panel: No results for input(s): VITAMINB12, FOLATE, FERRITIN, TIBC, IRON, RETICCTPCT in the last 72 hours.  RADIOLOGY: Ct Abdomen Pelvis Wo Contrast  11/09/2015  CLINICAL DATA:  Preoperative evaluation, prior to surgical treatment of nonischemic cardiomyopathy and left ventricular thrombus. Initial encounter. EXAM: CT CHEST, ABDOMEN AND PELVIS WITHOUT CONTRAST TECHNIQUE: Multidetector CT imaging of the chest, abdomen and pelvis was performed following the standard protocol without IV contrast. COMPARISON:  None. FINDINGS: CT CHEST Patchy peripheral opacities within the right upper and middle lobes likely reflect scarring, though mild infection could  have a similar appearance. The lungs are otherwise grossly clear. No pleural effusion or pneumothorax is seen. No masses are identified. Minimal blebs are noted at the lung apices. The heart is mildly enlarged, with left ventricular enlargement. A right PICC is noted ending about the proximal SVC. No pericardial effusion is identified. The great vessels are grossly unremarkable in appearance. No mediastinal lymphadenopathy is seen. The visualized portions of thyroid gland are unremarkable. No axillary lymphadenopathy is seen. No acute osseous abnormalities are identified. CT ABDOMEN AND PELVIS The liver and spleen are unremarkable in appearance. The gallbladder is within normal limits. The pancreas and adrenal glands are unremarkable. The kidneys are unremarkable in appearance. There is no evidence of hydronephrosis. No renal or ureteral stones are seen. No perinephric stranding is appreciated. No free fluid is identified. The small bowel is unremarkable in appearance. The stomach is within normal limits. No acute vascular abnormalities are seen. The appendix is normal in caliber, without evidence of appendicitis. The colon is grossly unremarkable in appearance. The bladder is mildly distended and grossly remarkable. The prostate remains normal in size. No inguinal lymphadenopathy is seen. No acute osseous abnormalities are identified. IMPRESSION: 1. Patchy peripheral opacities within the right upper and middle lobes likely reflect scarring, though mild infection could have a similar appearance. 2. Mild cardiomegaly, with left ventricular enlargement. 3. No acute abnormality seen within the abdomen or pelvis. Electronically Signed   By: Roanna Raider M.D.   On: 11/09/2015 20:00   Dg Chest 2 View  11/04/2015  CLINICAL DATA:  Chest pain and shortness of breath for 1 day EXAM: CHEST  2 VIEW COMPARISON:  None. FINDINGS: Cardiac enlargement without vascular congestion. Focal area of nodular consolidation in the right  upper lung probably represents focal pneumonia. Soft tissue lesion is not excluded. Followup PA and lateral chest X-ray is recommended in 3-4 weeks following trial of antibiotic therapy to ensure resolution and exclude underlying malignancy. Left lung is clear. No blunting of costophrenic angles. No pneumothorax. Mediastinal contours appear intact. Degenerative changes in the spine and shoulders. IMPRESSION: Cardiac enlargement. Focal nodular consolidation at the right upper lung likely represent pneumonia. Followup PA and lateral chest X-ray is recommended in 3-4 weeks following trial of antibiotic therapy to ensure resolution and exclude underlying malignancy. Electronically Signed   By: Burman Nieves M.D.   On: 11/04/2015 23:00   Ct Chest Wo Contrast  11/09/2015  CLINICAL DATA:  Preoperative evaluation, prior to surgical treatment of nonischemic cardiomyopathy and left ventricular thrombus. Initial encounter. EXAM: CT CHEST, ABDOMEN AND PELVIS WITHOUT CONTRAST TECHNIQUE: Multidetector CT imaging of the chest, abdomen and pelvis was performed following the standard protocol without IV contrast. COMPARISON:  None. FINDINGS: CT CHEST Patchy peripheral opacities within the right upper and middle lobes likely reflect scarring, though mild infection could have a similar  appearance. The lungs are otherwise grossly clear. No pleural effusion or pneumothorax is seen. No masses are identified. Minimal blebs are noted at the lung apices. The heart is mildly enlarged, with left ventricular enlargement. A right PICC is noted ending about the proximal SVC. No pericardial effusion is identified. The great vessels are grossly unremarkable in appearance. No mediastinal lymphadenopathy is seen. The visualized portions of thyroid gland are unremarkable. No axillary lymphadenopathy is seen. No acute osseous abnormalities are identified. CT ABDOMEN AND PELVIS The liver and spleen are unremarkable in appearance. The gallbladder  is within normal limits. The pancreas and adrenal glands are unremarkable. The kidneys are unremarkable in appearance. There is no evidence of hydronephrosis. No renal or ureteral stones are seen. No perinephric stranding is appreciated. No free fluid is identified. The small bowel is unremarkable in appearance. The stomach is within normal limits. No acute vascular abnormalities are seen. The appendix is normal in caliber, without evidence of appendicitis. The colon is grossly unremarkable in appearance. The bladder is mildly distended and grossly remarkable. The prostate remains normal in size. No inguinal lymphadenopathy is seen. No acute osseous abnormalities are identified. IMPRESSION: 1. Patchy peripheral opacities within the right upper and middle lobes likely reflect scarring, though mild infection could have a similar appearance. 2. Mild cardiomegaly, with left ventricular enlargement. 3. No acute abnormality seen within the abdomen or pelvis. Electronically Signed   By: Roanna Raider M.D.   On: 11/09/2015 20:00   Mr Card Morphology Wo/w Cm  11/09/2015  CLINICAL DATA:  Nonischemic cardiomyopathy EXAM: CARDIAC MRI TECHNIQUE: The patient was scanned on a 1.5 Tesla GE magnet. A dedicated cardiac coil was used. Functional imaging was done using Fiesta sequences. 2,3, and 4 chamber views were done to assess for RWMA's. Modified Simpson's rule using a short axis stack was used to calculate an ejection fraction on a dedicated work [employer]. The patient received 25 cc of Multihance. After 10 minutes inversion recovery sequences were used to assess for infiltration and scar tissue. CONTRAST:  25 cc Multihance FINDINGS: Limited images of the lung fields showed no significant abnormalities. Markedly dilated left ventricle with relatively thin walls. Diffuse severe hypokinesis, EF 8%. There was a small LV thrombus adherent to the mid anterior wall. The right ventricle was mildly dilated with  moderately decreased systolic function. There was mild to moderate central mitral regurgitation. Trileaflet aortic valve with no significant stenosis or regurgitation. Moderate left atrial enlargement. Mild right atrial enlargement. On delayed enhancement imaging, there was patchy late gadolinium enhancement (LGE) in the inferior wall. There was a small area of full thickness LGE in the basal inferior wall. There was a small area of near-full thickness LGE in the mid inferior wall. Finally, there were a couple of small areas of < 25% thickness subendocardial LGE in the apical inferior wall. MEASUREMENTS: LVEDV 533 mL LV SV 42 mL LV EF 8% IMPRESSION: 1. Markedly dilated left ventricle with very severe diffuse hypokinesis, EF 8%. 2.  Mildly dilated RV with moderately decreased systolic function. 3.  Small LV mid anterior wall thrombus. 4. Patchy inferior LGE pattern as described above. This is of uncertain significance, could be related to prior myocarditis. [place] Electronically Signed   By: [name] M.D.   On: 11/09/2015 00:43    PHYSICAL EXAM CVP ~2  General: NAD. Sitting in chair.  Neck: JVP flat, no thyromegaly or thyroid nodule.  Lungs: Clear to auscultation bilaterally with normal respiratory effort. CV:  Lateral PMI.  Heart regular S1/S2, loud s3 no murmur.  No edema.   Abdomen: Soft, nontender, no hepatosplenomegaly, no distention.  Neurologic: Alert and oriented x 3.  Psych: Normal affect. Extremities: No clubbing or cyanosis.   TELEMETRY: Reviewed telemetry pt in NSR 90-100s  ASSESSMENT AND PLAN: 60 yo with history of nonischemic cardiomyopathy and apparently a prior LV thrombus presented with acute/chronic systolic CHF. He is followed by a cardiologist in [place].  1. Acute/chronic systolic CHF -> cardiogenic shock:  Nonischemic cardiomyopathy.  Cause uncertain => not a heavy drinker, had one sister with what sounds like sudden death.  Possible viral myocarditis. EF has been  very low per his report, do not have echo done yet here.  Had chest pain and elevated TnI at admission, so had LHC/RHC 4/24.  No coronary disease noted, no evidence for cardioembolism from LV thrombus either.  RHC showed markedly elevated filling pressures and low cardiac index at 1.39.  He was started on milrinone and IV Lasix.  Cardiac MRI was done, showing severe LV dilation with EF 8%, small LV thrombus, and mildly dilated RV with moderately decreased systolic function.  There was a patchy LGE pattern in the inferior wall, ?prior myocarditis.  HIV negative. - Milrinone restarted 4/28 now at 0.25. Co-ox 70% - Volume status low. Hold torsemide today.  - Continue current digoxin, lisinopril, and spironolactone. Check dig level in am.  - No beta blocker with low output.   - M-spike noted on SPEP.  Serum immunofixation negative. Does not look like cardiac amyloidosis on MRI.  - He has a LBBB and no ICD (pending Medicaid).  - At this point, Mr Binion is looking milrinone-dependent.  He has no device.  He has Medicaid pending.  We are proceeding with LVAD workup and he has been seen by surgeon. Keep in house until VAD.   2. LV thrombus: Small thrombus on MRI.  On heparin/coumadin overlap, INR 1.67. Will stop warfarin and cover with heparin pending possible surgery.  3. Anemia: unclear source. Todays hemoglobin is stable. Continue Protonix  Amy Clegg NP-C  11/14/2015 8:01 AM  Patient seen with NP, agree with the above note.  Co-ox back up now that milrinone restarted.  CVP is low, hold torsemide for now.    I think that he is going to need LVAD for support, will work towards getting LVAD this admission.  Has no device, reticent about sending home on milrinone.  Awaiting Medicaid, will need to work on this today.   Holding warfarin for possible surgery, covering with heparin gtt given LV thrombus.   [name] 11/14/2015 9:25 AM

## 2015-11-15 DIAGNOSIS — I428 Other cardiomyopathies: Secondary | ICD-10-CM | POA: Insufficient documentation

## 2015-11-15 DIAGNOSIS — Z515 Encounter for palliative care: Secondary | ICD-10-CM

## 2015-11-15 LAB — BASIC METABOLIC PANEL
Anion gap: 7 (ref 5–15)
BUN: 20 mg/dL (ref 6–20)
CHLORIDE: 100 mmol/L — AB (ref 101–111)
CO2: 25 mmol/L (ref 22–32)
Calcium: 9.1 mg/dL (ref 8.9–10.3)
Creatinine, Ser: 1.02 mg/dL (ref 0.61–1.24)
GFR calc non Af Amer: >60 mL/min (ref >60)
Glucose, Bld: 118 mg/dL — ABNORMAL HIGH (ref 65–99)
POTASSIUM: 4.4 mmol/L (ref 3.5–5.1)
SODIUM: 132 mmol/L — AB (ref 135–145)

## 2015-11-15 LAB — CARBOXYHEMOGLOBIN
Carboxyhemoglobin: 1.7 % — ABNORMAL HIGH (ref 0.5–1.5)
Methemoglobin: 0.5 % (ref 0.0–1.5)
O2 Saturation: 66.9 %
Total hemoglobin: 12.7 g/dL — ABNORMAL LOW (ref 13.5–18.0)

## 2015-11-15 LAB — CBC
HEMATOCRIT: 37.2 % — AB (ref 39.0–52.0)
HEMOGLOBIN: 11.8 g/dL — AB (ref 13.0–17.0)
MCH: 32.6 pg (ref 26.0–34.0)
MCHC: 31.7 g/dL (ref 30.0–36.0)
MCV: 102.8 fL — ABNORMAL HIGH (ref 78.0–100.0)
Platelets: 314 10*3/uL (ref 150–400)
RBC: 3.62 MIL/uL — ABNORMAL LOW (ref 4.22–5.81)
RDW: 14.2 % (ref 11.5–15.5)
WBC: 4.4 10*3/uL (ref 4.0–10.5)

## 2015-11-15 LAB — DIGOXIN LEVEL: DIGOXIN LVL: 0.3 ng/mL — AB (ref 0.8–2.0)

## 2015-11-15 LAB — PROTIME-INR
INR: 1.67 — AB (ref 0.00–1.49)
Prothrombin Time: 19.7 seconds — ABNORMAL HIGH (ref 11.6–15.2)

## 2015-11-15 LAB — FACTOR 5 LEIDEN

## 2015-11-15 LAB — HEPARIN LEVEL (UNFRACTIONATED): HEPARIN UNFRACTIONATED: 0.48 [IU]/mL (ref 0.30–0.70)

## 2015-11-15 MED ORDER — LISINOPRIL 2.5 MG PO TABS
2.5000 mg | ORAL_TABLET | Freq: Every day | ORAL | Status: DC
  Administered 2015-11-15 – 2015-11-16 (×2): 2.5 mg via ORAL
  Filled 2015-11-15 (×2): qty 1

## 2015-11-15 NOTE — Progress Notes (Signed)
CSW met at bedside with patient to discuss further pending medicaid and social security application. Patient states he has been in touch with medicaid worker in [place] and requested information was faxed on Friday. Patient stated he is feeling better and hope for continued improved health. Patient states "if needed and the only option then I am ready and ok with it" when asked about his thoughts about VAD. CSW will continue to reach out to medicaid and social security for status updates. Raquel Sarna, LCSW [phone]

## 2015-11-15 NOTE — Progress Notes (Signed)
CARDIAC REHAB PHASE I   PRE:  Rate/Rhythm: 98  Up in hall waiting to walk    SaO2: 99%RA  MODE:  Ambulation: 1400 ft   POST:  Rate/Rhythm: 120 ST  Showed up to 150 -160 but artifact  BP:  Supine:   Sitting: 121/67  Standing:    SaO2: 100%RA 1028-1100 Pt waiting in hall for walk. Walked 1400 ft independently with steady gait. Denied SOB or DOE and was surprised at how well he tolerated this distance. Pt stated he is looking forward to getting LVAD.   Luetta Nutting, RN BSN  11/15/2015 10:56 AM

## 2015-11-15 NOTE — Progress Notes (Signed)
CSW met with patient at bedside today. Patient reports he spoke with [employer] in [place] and they are processing his application (SSI, SSD, medicaid reviews) although stated that medical records from [facility] admission to complete disability review. Patient provided contact at [employer] and requested CSW follow up. CSW contacted Junita Push @ [employer] [phone] (fax# [phone]) who states he has sent request for medical records. CSW followed up with [employer] who will assist with expediting medical records to [employer] as requested. CSW will continue to follow for support and any follow up needed. Raquel Sarna, LCSW [phone]

## 2015-11-15 NOTE — Progress Notes (Signed)
ANTICOAGULATION CONSULT NOTE - Follow Up Consult  Pharmacy Consult for Heparin Indication: LV thrombus  No Known Allergies  Patient Measurements: Height: 5\' 11"  (180.3 cm) Weight: 159 lb 13.3 oz (72.5 kg) IBW/kg (Calculated) : 75.3  Vital Signs: Temp: 98 F (36.7 C) (05/02 0736) Temp Source: Oral (05/02 0736) BP: 94/70 mmHg (05/02 0736) Pulse Rate: 93 (05/02 0400)  Labs:  Recent Labs  11/13/15 0500  11/13/15 1930 11/14/15 0500  11/14/15 1110 11/14/15 1645 11/14/15 1825 11/15/15 0532  HGB 9.0*  --  13.0 12.3*  --   --   --   --  11.8*  HCT 27.4*  --  40.0 37.7*  --   --   --   --  37.2*  PLT 185  --  294 285  --   --   --   --  314  LABPROT 22.4*  --   --  19.7*  --   --   --   --  19.7*  INR 1.98*  --   --  1.67*  --   --   --   --  1.67*  HEPARINUNFRC 0.23*  < >  --   --   < >  --  1.02* 0.53 0.48  CREATININE 0.85  --   --   --   --  1.01  --   --  1.02  < > = values in this interval not displayed.  Estimated Creatinine Clearance: 81 mL/min (by C-G formula based on Cr of 1.02).   Medications:  Heparin @ 1400 units/hr  Assessment: 60yom on coumadin pta for hx LV thrombus, transitioned to IV heparin on admission pending cath. S/P cath 4/24, found to have NICM, coumadin resumed with heparin bridge. On 4/30, INR was 1.98 and heparin was stopped due to drop in H/H (12.6>9.0, 38.7>27.4) - repeat H/H was better (9.0>13, 27.4>40) and remains stable today. Patient is now anticipating LVAD this admission so coumadin held and heparin bridge restarted yesterday.  Today's heparin level is at goal 0.48. INR 1.67. No bleeding.  Goal of Therapy:  Heparin level 0.3-0.7 units/ml Monitor platelets by anticoagulation protocol: Yes   Plan:  1) Continue heparin at 1400 units/hr 2) Daily heparin level, CBC 3) Follow up plans for LVAD  Fredrik Rigger 11/15/2015,11:12 AM

## 2015-11-15 NOTE — Telephone Encounter (Signed)
CSW left multiple messages for Tammy Ballard- [facility] worker [phone] and fax # [phone] and also left message for Social Security worker Ms Lenward Chancellor [phone] with no return calls. CSW will continue to attempt contact. Lasandra Beech, LCSW [phone]

## 2015-11-15 NOTE — Progress Notes (Signed)
Nutrition Follow-up  DOCUMENTATION CODES:   Non-severe (moderate) malnutrition in context of chronic illness  INTERVENTION:   -Decrease Ensure Enlive po to daily, each supplement provides 350 kcal and 20 grams of protein -Continue Snacks BID (fresh fruit) per pt request  NUTRITION DIAGNOSIS:   Malnutrition related to chronic illness as evidenced by mild depletion of body fat, mild depletion of muscle mass.  Ongoing  GOAL:   Patient will meet greater than or equal to 90% of their needs  Progressing  MONITOR:   PO intake, Supplement acceptance, Labs, Weight trends, Skin, I & O's  REASON FOR ASSESSMENT:   Consult  (VAD evaluation)  ASSESSMENT:   60 yo with history of nonischemic cardiomyopathy and apparently a prior LV thrombus presented with acute/chronic systolic CHF. He is followed by a cardiologist in [place].   Pt in good spirits, ambulating halls with cardiac rehab at time of visit.   Pt appetite continues to improve. Meal completion 75-100%. Noted breakfast tray on bedside, which pt consumed 100%. Unopened Ensure and fruit cup at bedside. Noted pt has been taking Ensure supplements about 50% of the time. RD will decrease to daily from BID, due to increased PO intake.   LVAD work-up ongoing.   Labs reviewed: Na: 132.   Diet Order:  Diet Heart Room service appropriate?: Yes; Fluid consistency:: Thin  Skin:  Reviewed, no issues  Last BM:  11/15/15  Height:   Ht Readings from Last 1 Encounters:  11/15/15 5\' 11"  (1.803 m)    Weight:   Wt Readings from Last 1 Encounters:  11/15/15 159 lb 13.3 oz (72.5 kg)    Ideal Body Weight:  78.2 kg  BMI:  Body mass index is 22.3 kg/(m^2).  Estimated Nutritional Needs:   Kcal:  1900-2100  Protein:  105-120 grams  Fluid:  1.9-2.1 L  EDUCATION NEEDS:   Education needs addressed  Stasia Somero A. Mayford Knife, RD, LDN, CDE Pager: [phone] After hours Pager: [phone]

## 2015-11-15 NOTE — Progress Notes (Addendum)
Patient ID: Baily Serpe, male   DOB: [birth year], 58 y.o.   MRN: [id number]   SUBJECTIVE:   Milrinone dropped to 51% on 4/28. Restarted 4/29. Yesterday torsemide was held and spiro was increased to 25 mg daily. Todays CO-OX on milrinone 0.25 mcg is 67%.  Hgb 11.8. Able to walk around the unit.   Denies SOB.    LHC/RHC (11/07/15) Left Main  No significant coronary disease.      Left Anterior Descending  No significant coronary disease.     Ramus Intermedius  Moderate vessel, no significant coronary disease.     Left Circumflex  No significant coronary disease.     Right Coronary Artery  No significant coronary disease.      Wall Motion    Not done, possible LV thrombus.   RHC Procedural Findings (mmHg): Hemodynamics RA mean 22 RV 56/25 PA 57/34, mean 25 PCWP mean 35 AO 83/68  Oxygen saturations: PA 36% AO 100%  Cardiac Output (Fick) 2.72  Cardiac Index (Fick) 1.39  PVR 2.6 WU       Cardiac MRI (4/25): Severe LV dilation with EF 8%, small thrombus on mid anterior wall, mildly dilated RV with moderately decreased systolic function.  Patchy LGE pattern in inferior wall, ?prior myocarditis.   Scheduled Meds: . digoxin  0.125 mg Oral Daily  . feeding supplement (ENSURE ENLIVE)  237 mL Oral BID BM  . lisinopril  2.5 mg Oral BID  . sodium chloride flush  10-40 mL Intracatheter Q12H  . sodium chloride flush  3 mL Intravenous Q12H  . sodium chloride flush  3 mL Intravenous Q12H  . spironolactone  25 mg Oral Daily   Continuous Infusions: . heparin 1,400 Units/hr (11/15/15 0700)  . milrinone 0.25 mcg/kg/min (11/15/15 0700)   PRN Meds:.sodium chloride, sodium chloride, acetaminophen, nitroGLYCERIN, ondansetron (ZOFRAN) IV, sodium chloride flush, sodium chloride flush, sodium chloride flush   Filed Vitals:   11/14/15 2020 11/14/15 2359 11/15/15 0000 11/15/15 0400  BP: 84/74 66/58  69/40  Pulse:  103  93  Temp:   98.3 F (36.8 C) 98.4 F (36.9 C)    TempSrc:   Oral Oral  Resp: 27 18 16 16   Height:    5\' 11"  (1.803 m)  Weight:    159 lb 13.3 oz (72.5 kg)  SpO2: 98%  99% 100%    Intake/Output Summary (Last 24 hours) at 11/15/15 0735 Last data filed at 11/15/15 0700  Gross per 24 hour  Intake 1097.27 ml  Output   1700 ml  Net -602.73 ml    LABS: Basic Metabolic Panel:  Recent Labs  40/98/11 1110 11/15/15 0532  NA 131* 132*  K 4.2 4.4  CL 98* 100*  CO2 26 25  GLUCOSE 97 118*  BUN 16 20  CREATININE 1.01 1.02  CALCIUM 9.1 9.1   Liver Function Tests: No results for input(s): AST, ALT, ALKPHOS, BILITOT, PROT, ALBUMIN in the last 72 hours. No results for input(s): LIPASE, AMYLASE in the last 72 hours. CBC:  Recent Labs  11/14/15 0500 11/15/15 0532  WBC 4.1 4.4  HGB 12.3* 11.8*  HCT 37.7* 37.2*  MCV 103.9* 102.8*  PLT 285 314   Cardiac Enzymes: No results for input(s): CKTOTAL, CKMB, CKMBINDEX, TROPONINI in the last 72 hours. BNP: Invalid input(s): POCBNP D-Dimer: No results for input(s): DDIMER in the last 72 hours. Hemoglobin A1C: No results for input(s): HGBA1C in the last 72 hours. Fasting Lipid Panel: No results for input(s): CHOL, HDL, LDLCALC, TRIG,  CHOLHDL, LDLDIRECT in the last 72 hours. Thyroid Function Tests: No results for input(s): TSH, T4TOTAL, T3FREE, THYROIDAB in the last 72 hours.  Invalid input(s): FREET3 Anemia Panel: No results for input(s): VITAMINB12, FOLATE, FERRITIN, TIBC, IRON, RETICCTPCT in the last 72 hours.  RADIOLOGY: Ct Abdomen Pelvis Wo Contrast  11/09/2015  CLINICAL DATA:  Preoperative evaluation, prior to surgical treatment of nonischemic cardiomyopathy and left ventricular thrombus. Initial encounter. EXAM: CT CHEST, ABDOMEN AND PELVIS WITHOUT CONTRAST TECHNIQUE: Multidetector CT imaging of the chest, abdomen and pelvis was performed following the standard protocol without IV contrast. COMPARISON:  None. FINDINGS: CT CHEST Patchy peripheral opacities within the right  upper and middle lobes likely reflect scarring, though mild infection could have a similar appearance. The lungs are otherwise grossly clear. No pleural effusion or pneumothorax is seen. No masses are identified. Minimal blebs are noted at the lung apices. The heart is mildly enlarged, with left ventricular enlargement. A right PICC is noted ending about the proximal SVC. No pericardial effusion is identified. The great vessels are grossly unremarkable in appearance. No mediastinal lymphadenopathy is seen. The visualized portions of thyroid gland are unremarkable. No axillary lymphadenopathy is seen. No acute osseous abnormalities are identified. CT ABDOMEN AND PELVIS The liver and spleen are unremarkable in appearance. The gallbladder is within normal limits. The pancreas and adrenal glands are unremarkable. The kidneys are unremarkable in appearance. There is no evidence of hydronephrosis. No renal or ureteral stones are seen. No perinephric stranding is appreciated. No free fluid is identified. The small bowel is unremarkable in appearance. The stomach is within normal limits. No acute vascular abnormalities are seen. The appendix is normal in caliber, without evidence of appendicitis. The colon is grossly unremarkable in appearance. The bladder is mildly distended and grossly remarkable. The prostate remains normal in size. No inguinal lymphadenopathy is seen. No acute osseous abnormalities are identified. IMPRESSION: 1. Patchy peripheral opacities within the right upper and middle lobes likely reflect scarring, though mild infection could have a similar appearance. 2. Mild cardiomegaly, with left ventricular enlargement. 3. No acute abnormality seen within the abdomen or pelvis. Electronically Signed   By: Roanna Raider M.D.   On: 11/09/2015 20:00   Dg Chest 2 View  11/04/2015  CLINICAL DATA:  Chest pain and shortness of breath for 1 day EXAM: CHEST  2 VIEW COMPARISON:  None. FINDINGS: Cardiac enlargement  without vascular congestion. Focal area of nodular consolidation in the right upper lung probably represents focal pneumonia. Soft tissue lesion is not excluded. Followup PA and lateral chest X-ray is recommended in 3-4 weeks following trial of antibiotic therapy to ensure resolution and exclude underlying malignancy. Left lung is clear. No blunting of costophrenic angles. No pneumothorax. Mediastinal contours appear intact. Degenerative changes in the spine and shoulders. IMPRESSION: Cardiac enlargement. Focal nodular consolidation at the right upper lung likely represent pneumonia. Followup PA and lateral chest X-ray is recommended in 3-4 weeks following trial of antibiotic therapy to ensure resolution and exclude underlying malignancy. Electronically Signed   By: Burman Nieves M.D.   On: 11/04/2015 23:00   Ct Chest Wo Contrast  11/09/2015  CLINICAL DATA:  Preoperative evaluation, prior to surgical treatment of nonischemic cardiomyopathy and left ventricular thrombus. Initial encounter. EXAM: CT CHEST, ABDOMEN AND PELVIS WITHOUT CONTRAST TECHNIQUE: Multidetector CT imaging of the chest, abdomen and pelvis was performed following the standard protocol without IV contrast. COMPARISON:  None. FINDINGS: CT CHEST Patchy peripheral opacities within the right upper and middle lobes likely  reflect scarring, though mild infection could have a similar appearance. The lungs are otherwise grossly clear. No pleural effusion or pneumothorax is seen. No masses are identified. Minimal blebs are noted at the lung apices. The heart is mildly enlarged, with left ventricular enlargement. A right PICC is noted ending about the proximal SVC. No pericardial effusion is identified. The great vessels are grossly unremarkable in appearance. No mediastinal lymphadenopathy is seen. The visualized portions of thyroid gland are unremarkable. No axillary lymphadenopathy is seen. No acute osseous abnormalities are identified. CT ABDOMEN  AND PELVIS The liver and spleen are unremarkable in appearance. The gallbladder is within normal limits. The pancreas and adrenal glands are unremarkable. The kidneys are unremarkable in appearance. There is no evidence of hydronephrosis. No renal or ureteral stones are seen. No perinephric stranding is appreciated. No free fluid is identified. The small bowel is unremarkable in appearance. The stomach is within normal limits. No acute vascular abnormalities are seen. The appendix is normal in caliber, without evidence of appendicitis. The colon is grossly unremarkable in appearance. The bladder is mildly distended and grossly remarkable. The prostate remains normal in size. No inguinal lymphadenopathy is seen. No acute osseous abnormalities are identified. IMPRESSION: 1. Patchy peripheral opacities within the right upper and middle lobes likely reflect scarring, though mild infection could have a similar appearance. 2. Mild cardiomegaly, with left ventricular enlargement. 3. No acute abnormality seen within the abdomen or pelvis. Electronically Signed   By: Roanna Raider M.D.   On: 11/09/2015 20:00   Mr Card Morphology Wo/w Cm  11/09/2015  CLINICAL DATA:  Nonischemic cardiomyopathy EXAM: CARDIAC MRI TECHNIQUE: The patient was scanned on a 1.5 Tesla GE magnet. A dedicated cardiac coil was used. Functional imaging was done using Fiesta sequences. 2,3, and 4 chamber views were done to assess for RWMA's. Modified Simpson's rule using a short axis stack was used to calculate an ejection fraction on a dedicated work [employer]. The patient received 25 cc of Multihance. After 10 minutes inversion recovery sequences were used to assess for infiltration and scar tissue. CONTRAST:  25 cc Multihance FINDINGS: Limited images of the lung fields showed no significant abnormalities. Markedly dilated left ventricle with relatively thin walls. Diffuse severe hypokinesis, EF 8%. There was a small LV thrombus  adherent to the mid anterior wall. The right ventricle was mildly dilated with moderately decreased systolic function. There was mild to moderate central mitral regurgitation. Trileaflet aortic valve with no significant stenosis or regurgitation. Moderate left atrial enlargement. Mild right atrial enlargement. On delayed enhancement imaging, there was patchy late gadolinium enhancement (LGE) in the inferior wall. There was a small area of full thickness LGE in the basal inferior wall. There was a small area of near-full thickness LGE in the mid inferior wall. Finally, there were a couple of small areas of < 25% thickness subendocardial LGE in the apical inferior wall. MEASUREMENTS: LVEDV 533 mL LV SV 42 mL LV EF 8% IMPRESSION: 1. Markedly dilated left ventricle with very severe diffuse hypokinesis, EF 8%. 2.  Mildly dilated RV with moderately decreased systolic function. 3.  Small LV mid anterior wall thrombus. 4. Patchy inferior LGE pattern as described above. This is of uncertain significance, could be related to prior myocarditis. Lyah Millirons Electronically Signed   By: [name] M.D.   On: 11/09/2015 00:43    PHYSICAL EXAM CVP ~1  General: NAD. Sitting in chair.  Neck: JVP flat, no thyromegaly or thyroid nodule.  Lungs:  Clear to auscultation bilaterally with normal respiratory effort. CV: Lateral PMI.  Heart regular S1/S2, loud s3 no murmur.  No edema.   Abdomen: Soft, nontender, no hepatosplenomegaly, no distention.  Neurologic: Alert and oriented x 3.  Psych: Normal affect. Extremities: No clubbing or cyanosis.   TELEMETRY: Reviewed telemetry pt in NSR 100s  ASSESSMENT AND PLAN: 60 yo with history of nonischemic cardiomyopathy and apparently a prior LV thrombus presented with acute/chronic systolic CHF. He is followed by a cardiologist in [place].  1. Acute/chronic systolic CHF -> cardiogenic shock:  Nonischemic cardiomyopathy.  Cause uncertain => not a heavy drinker, had one sister  with what sounds like sudden death.  Possible viral myocarditis. EF has been very low per his report, do not have echo done yet here.  Had chest pain and elevated TnI at admission, so had LHC/RHC 4/24.  No coronary disease noted, no evidence for cardioembolism from LV thrombus either.  RHC showed markedly elevated filling pressures and low cardiac index at 1.39.  He was started on milrinone and IV Lasix.  Cardiac MRI was done, showing severe LV dilation with EF 8%, small LV thrombus, and mildly dilated RV with moderately decreased systolic function.  There was a patchy LGE pattern in the inferior wall, ?prior myocarditis.  HIV negative. - Milrinone restarted 4/28 now at 0.25. Co-ox 67% - Volume status low. Hold torsemide today.  - Continue current digoxin. Dig level 0.3 today.  - Continue 25 mg spironolactone daily.  Cut back lisinopril to 2.5 mg daily (stop evening dose with soft BP overnight).  - No beta blocker with low output.   - M-spike noted on SPEP.  Serum immunofixation negative. Does not look like cardiac amyloidosis on MRI.  - He has a LBBB and no ICD (pending Medicaid).  - At this point, Mr Dronen is looking milrinone-dependent.  He has no device.  He has Medicaid pending.  We are proceeding with LVAD workup and he has been seen by surgeon. Keep in house until VAD.   2. LV thrombus: Small thrombus on MRI.  On heparin, INR 1.67. Off  warfarin and cover with heparin pending possible surgery.  3. Anemia: unclear source. Todays hemoglobin is stable. Continue Protonix  Amy Clegg NP-C  11/15/2015 7:35 AM   Patient seen with NP, agree with the above note.  Patient is milrinone-dependent at this point.  We are evaluating him for LVAD.  Barriers include lack of insurance coverage (Medicaid pending) and question of family support post-op.  I think that we are going to be able to work through [employer] issue.  We talked again today about family support post-LVAD.  He says that his mother, a retired  Charity fundraiser, and his sister who is also retired can stay as long as needed to help him.  He will live with his brother in [place].  I will have our LVAD nurse coordinator talk with him again today.  We will also try to speak with his mother again this morning.  If all agree that he seems reasonable for implant, I would favor proceeding this admission.   Stop pm lisinopril dose with soft BP overnight.    Creatinine stable.   Covering with heparin gtt while off coumadin given LV thrombus.   [name] 11/15/2015 8:11 AM

## 2015-11-15 NOTE — Progress Notes (Signed)
8 Days Post-Op Procedure(s) (LRB): Left Heart Cath and Coronary Angiography (N/A) Subjective: Events regarding moving toward VAD noted He will need repeat RHC before surgery, upper endoscopy Would start norepi for sustained BP < 90 SAP  Objective: Vital signs in last 24 hours: Temp:  [98 F (36.7 C)-98.4 F (36.9 C)] 98 F (36.7 C) (05/02 1654) Pulse Rate:  [93-103] 93 (05/02 0400) Cardiac Rhythm:  [-] Normal sinus rhythm;Sinus tachycardia (05/02 1600) Resp:  [16-27] 22 (05/02 1654) BP: (66-121)/(40-74) 78/64 mmHg (05/02 1654) SpO2:  [98 %-100 %] 100 % (05/02 1654) Weight:  [159 lb 13.3 oz (72.5 kg)] 159 lb 13.3 oz (72.5 kg) (05/02 0400)  Hemodynamic parameters for last 24 hours: CVP:  [1 mmHg-2 mmHg] 1 mmHg  Intake/Output from previous day: 05/01 0701 - 05/02 0700 In: 1097.3 [P.O.:600; I.V.:497.3] Out: 1700 [Urine:1700] Intake/Output this shift: Total I/O In: 573.4 [P.O.:360; I.V.:213.4] Out: 1025 [Urine:1025]    Lab Results:  Recent Labs  11/14/15 0500 11/15/15 0532  WBC 4.1 4.4  HGB 12.3* 11.8*  HCT 37.7* 37.2*  PLT 285 314   BMET:  Recent Labs  11/14/15 1110 11/15/15 0532  NA 131* 132*  K 4.2 4.4  CL 98* 100*  CO2 26 25  GLUCOSE 97 118*  BUN 16 20  CREATININE 1.01 1.02  CALCIUM 9.1 9.1    PT/INR:  Recent Labs  11/15/15 0532  LABPROT 19.7*  INR 1.67*   ABG    Component Value Date/Time   PHART 7.480* 11/09/2015 0856   HCO3 24.6* 11/09/2015 0856   TCO2 25.6 11/09/2015 0856   ACIDBASEDEF 1.0 11/07/2015 1129   ACIDBASEDEF 2.0 11/07/2015 1129   O2SAT 66.9 11/15/2015 0505   CBG (last 3)  No results for input(s): GLUCAP in the last 72 hours.  Assessment/Plan: S/P Procedure(s) (LRB): Left Heart Cath and Coronary Angiography (N/A) continue to prepare for VAD implant since he is milrinone dependent   LOS: 10 days    Matthew Vaughan 11/15/2015

## 2015-11-16 ENCOUNTER — Inpatient Hospital Stay (HOSPITAL_COMMUNITY): Payer: Medicaid Other

## 2015-11-16 DIAGNOSIS — D508 Other iron deficiency anemias: Secondary | ICD-10-CM

## 2015-11-16 DIAGNOSIS — R0602 Shortness of breath: Secondary | ICD-10-CM

## 2015-11-16 LAB — CARBOXYHEMOGLOBIN
Carboxyhemoglobin: 1.5 % (ref 0.5–1.5)
Carboxyhemoglobin: 2.1 % — ABNORMAL HIGH (ref 0.5–1.5)
METHEMOGLOBIN: 0.6 % (ref 0.0–1.5)
Methemoglobin: 0.7 % (ref 0.0–1.5)
O2 SAT: 61.1 %
O2 Saturation: 70.3 %
TOTAL HEMOGLOBIN: 12 g/dL — AB (ref 13.5–18.0)
Total hemoglobin: 13.2 g/dL — ABNORMAL LOW (ref 13.5–18.0)

## 2015-11-16 LAB — PROTIME-INR
INR: 1.4 (ref 0.00–1.49)
Prothrombin Time: 17.2 seconds — ABNORMAL HIGH (ref 11.6–15.2)

## 2015-11-16 LAB — COMPREHENSIVE METABOLIC PANEL
ALT: 70 U/L — ABNORMAL HIGH (ref 17–63)
AST: 58 U/L — ABNORMAL HIGH (ref 15–41)
Albumin: 2.8 g/dL — ABNORMAL LOW (ref 3.5–5.0)
Alkaline Phosphatase: 98 U/L (ref 38–126)
Anion gap: 7 (ref 5–15)
BUN: 16 mg/dL (ref 6–20)
CO2: 25 mmol/L (ref 22–32)
Calcium: 9.2 mg/dL (ref 8.9–10.3)
Chloride: 104 mmol/L (ref 101–111)
Creatinine, Ser: 1.04 mg/dL (ref 0.61–1.24)
GFR calc Af Amer: >60 mL/min (ref >60)
GFR calc non Af Amer: >60 mL/min (ref >60)
Glucose, Bld: 112 mg/dL — ABNORMAL HIGH (ref 65–99)
Potassium: 4.7 mmol/L (ref 3.5–5.1)
Sodium: 136 mmol/L (ref 135–145)
Total Bilirubin: 1 mg/dL (ref 0.3–1.2)
Total Protein: 7.5 g/dL (ref 6.5–8.1)

## 2015-11-16 LAB — CBC
HEMATOCRIT: 34.9 % — AB (ref 39.0–52.0)
HEMOGLOBIN: 11.3 g/dL — AB (ref 13.0–17.0)
MCH: 33.1 pg (ref 26.0–34.0)
MCHC: 32.4 g/dL (ref 30.0–36.0)
MCV: 102.3 fL — ABNORMAL HIGH (ref 78.0–100.0)
Platelets: 303 10*3/uL (ref 150–400)
RBC: 3.41 MIL/uL — AB (ref 4.22–5.81)
RDW: 14.1 % (ref 11.5–15.5)
WBC: 4.2 10*3/uL (ref 4.0–10.5)

## 2015-11-16 LAB — PREALBUMIN: Prealbumin: 23.3 mg/dL (ref 18–38)

## 2015-11-16 LAB — HEPARIN LEVEL (UNFRACTIONATED)
Heparin Unfractionated: 0.79 IU/mL — ABNORMAL HIGH (ref 0.30–0.70)
Heparin Unfractionated: 0.77 IU/mL — ABNORMAL HIGH (ref 0.30–0.70)

## 2015-11-16 MED ORDER — NOREPINEPHRINE BITARTRATE 1 MG/ML IV SOLN
5.0000 ug/min | INTRAVENOUS | Status: DC
  Administered 2015-11-16 – 2015-11-20 (×5): 3 ug/min via INTRAVENOUS
  Administered 2015-11-21: 5 ug/min via INTRAVENOUS
  Filled 2015-11-16 (×5): qty 4

## 2015-11-16 MED ORDER — PEG-KCL-NACL-NASULF-NA ASC-C 100 G PO SOLR
0.5000 | Freq: Once | ORAL | Status: AC
  Administered 2015-11-17: 100 g via ORAL
  Filled 2015-11-16: qty 1

## 2015-11-16 MED ORDER — HEPARIN (PORCINE) IN NACL 100-0.45 UNIT/ML-% IJ SOLN
1100.0000 [IU]/h | INTRAMUSCULAR | Status: DC
  Administered 2015-11-16: 1100 [IU]/h via INTRAVENOUS
  Filled 2015-11-16: qty 250

## 2015-11-16 MED ORDER — PEG-KCL-NACL-NASULF-NA ASC-C 100 G PO SOLR
0.5000 | Freq: Once | ORAL | Status: AC
  Administered 2015-11-16: 0.5 via ORAL
  Filled 2015-11-16: qty 1

## 2015-11-16 MED ORDER — PEG-KCL-NACL-NASULF-NA ASC-C 100 G PO SOLR: 1.0000 | Freq: Once | ORAL | Status: DC

## 2015-11-16 MED ORDER — SPIRONOLACTONE 25 MG PO TABS
12.5000 mg | ORAL_TABLET | Freq: Every day | ORAL | Status: DC
  Administered 2015-11-16: 12.5 mg via ORAL
  Filled 2015-11-16: qty 1

## 2015-11-16 MED ORDER — BISACODYL 5 MG PO TBEC
10.0000 mg | DELAYED_RELEASE_TABLET | Freq: Once | ORAL | Status: AC
  Administered 2015-11-16: 10 mg via ORAL

## 2015-11-16 MED ORDER — METOCLOPRAMIDE HCL 5 MG/ML IJ SOLN: 10.0000 mg | Freq: Four times a day (QID) | INTRAMUSCULAR | Status: AC | PRN

## 2015-11-16 NOTE — Progress Notes (Signed)
Patient ID: Matthew Vaughan, male   DOB: [birth year], 58 y.o.   MRN: [id number]   SUBJECTIVE:  Failed Milrinone wean. Remains on milrinone 0.25 mcg.Yesterday lisinopril was cut back to 2.5 mg daily for soft BP. Todays CO-OX on milrinone 0.25 mcg is 61%.  Hgb 11.3.   Denies SOB.    LHC/RHC (11/07/15) Left Main  No significant coronary disease.      Left Anterior Descending  No significant coronary disease.     Ramus Intermedius  Moderate vessel, no significant coronary disease.     Left Circumflex  No significant coronary disease.     Right Coronary Artery  No significant coronary disease.      Wall Motion    Not done, possible LV thrombus.   RHC Procedural Findings (mmHg): Hemodynamics RA mean 22 RV 56/25 PA 57/34, mean 25 PCWP mean 35 AO 83/68  Oxygen saturations: PA 36% AO 100%  Cardiac Output (Fick) 2.72  Cardiac Index (Fick) 1.39  PVR 2.6 WU       Cardiac MRI (4/25): Severe LV dilation with EF 8%, small thrombus on mid anterior wall, mildly dilated RV with moderately decreased systolic function.  Patchy LGE pattern in inferior wall, ?prior myocarditis.   Scheduled Meds: . digoxin  0.125 mg Oral Daily  . feeding supplement (ENSURE ENLIVE)  237 mL Oral BID BM  . lisinopril  2.5 mg Oral Daily  . sodium chloride flush  10-40 mL Intracatheter Q12H  . sodium chloride flush  3 mL Intravenous Q12H  . sodium chloride flush  3 mL Intravenous Q12H  . spironolactone  25 mg Oral Daily   Continuous Infusions: . heparin 1,250 Units/hr (11/16/15 0436)  . milrinone 0.25 mcg/kg/min (11/15/15 2000)   PRN Meds:.sodium chloride, sodium chloride, acetaminophen, nitroGLYCERIN, ondansetron (ZOFRAN) IV, sodium chloride flush, sodium chloride flush, sodium chloride flush   Filed Vitals:   11/16/15 0014 11/16/15 0017 11/16/15 0312 11/16/15 0500  BP: 97/68  102/76   Pulse:      Temp: 98 F (36.7 C) 98 F (36.7 C) 98.3 F (36.8 C)   TempSrc: Oral Oral Oral   Resp: 22   21   Height:      Weight:    161 lb (73.029 kg)  SpO2: 99%  99%     Intake/Output Summary (Last 24 hours) at 11/16/15 0728 Last data filed at 11/16/15 0600  Gross per 24 hour  Intake  814.1 ml  Output   1975 ml  Net -1160.9 ml    LABS: Basic Metabolic Panel:  Recent Labs  78/29/56 0532 11/16/15 0335  NA 132* 136  K 4.4 4.7  CL 100* 104  CO2 25 25  GLUCOSE 118* 112*  BUN 20 16  CREATININE 1.02 1.04  CALCIUM 9.1 9.2   Liver Function Tests:  Recent Labs  11/16/15 0335  AST 58*  ALT 70*  ALKPHOS 98  BILITOT 1.0  PROT 7.5  ALBUMIN 2.8*   No results for input(s): LIPASE, AMYLASE in the last 72 hours. CBC:  Recent Labs  11/15/15 0532 11/16/15 0335  WBC 4.4 4.2  HGB 11.8* 11.3*  HCT 37.2* 34.9*  MCV 102.8* 102.3*  PLT 314 303   Cardiac Enzymes: No results for input(s): CKTOTAL, CKMB, CKMBINDEX, TROPONINI in the last 72 hours. BNP: Invalid input(s): POCBNP D-Dimer: No results for input(s): DDIMER in the last 72 hours. Hemoglobin A1C: No results for input(s): HGBA1C in the last 72 hours. Fasting Lipid Panel: No results for input(s): CHOL, HDL, LDLCALC,  TRIG, CHOLHDL, LDLDIRECT in the last 72 hours. Thyroid Function Tests: No results for input(s): TSH, T4TOTAL, T3FREE, THYROIDAB in the last 72 hours.  Invalid input(s): FREET3 Anemia Panel: No results for input(s): VITAMINB12, FOLATE, FERRITIN, TIBC, IRON, RETICCTPCT in the last 72 hours.  RADIOLOGY: Ct Abdomen Pelvis Wo Contrast  11/09/2015  CLINICAL DATA:  Preoperative evaluation, prior to surgical treatment of nonischemic cardiomyopathy and left ventricular thrombus. Initial encounter. EXAM: CT CHEST, ABDOMEN AND PELVIS WITHOUT CONTRAST TECHNIQUE: Multidetector CT imaging of the chest, abdomen and pelvis was performed following the standard protocol without IV contrast. COMPARISON:  None. FINDINGS: CT CHEST Patchy peripheral opacities within the right upper and middle lobes likely reflect scarring,  though mild infection could have a similar appearance. The lungs are otherwise grossly clear. No pleural effusion or pneumothorax is seen. No masses are identified. Minimal blebs are noted at the lung apices. The heart is mildly enlarged, with left ventricular enlargement. A right PICC is noted ending about the proximal SVC. No pericardial effusion is identified. The great vessels are grossly unremarkable in appearance. No mediastinal lymphadenopathy is seen. The visualized portions of thyroid gland are unremarkable. No axillary lymphadenopathy is seen. No acute osseous abnormalities are identified. CT ABDOMEN AND PELVIS The liver and spleen are unremarkable in appearance. The gallbladder is within normal limits. The pancreas and adrenal glands are unremarkable. The kidneys are unremarkable in appearance. There is no evidence of hydronephrosis. No renal or ureteral stones are seen. No perinephric stranding is appreciated. No free fluid is identified. The small bowel is unremarkable in appearance. The stomach is within normal limits. No acute vascular abnormalities are seen. The appendix is normal in caliber, without evidence of appendicitis. The colon is grossly unremarkable in appearance. The bladder is mildly distended and grossly remarkable. The prostate remains normal in size. No inguinal lymphadenopathy is seen. No acute osseous abnormalities are identified. IMPRESSION: 1. Patchy peripheral opacities within the right upper and middle lobes likely reflect scarring, though mild infection could have a similar appearance. 2. Mild cardiomegaly, with left ventricular enlargement. 3. No acute abnormality seen within the abdomen or pelvis. Electronically Signed   By: Roanna Raider M.D.   On: 11/09/2015 20:00   Dg Chest 2 View  11/04/2015  CLINICAL DATA:  Chest pain and shortness of breath for 1 day EXAM: CHEST  2 VIEW COMPARISON:  None. FINDINGS: Cardiac enlargement without vascular congestion. Focal area of  nodular consolidation in the right upper lung probably represents focal pneumonia. Soft tissue lesion is not excluded. Followup PA and lateral chest X-ray is recommended in 3-4 weeks following trial of antibiotic therapy to ensure resolution and exclude underlying malignancy. Left lung is clear. No blunting of costophrenic angles. No pneumothorax. Mediastinal contours appear intact. Degenerative changes in the spine and shoulders. IMPRESSION: Cardiac enlargement. Focal nodular consolidation at the right upper lung likely represent pneumonia. Followup PA and lateral chest X-ray is recommended in 3-4 weeks following trial of antibiotic therapy to ensure resolution and exclude underlying malignancy. Electronically Signed   By: Burman Nieves M.D.   On: 11/04/2015 23:00   Ct Chest Wo Contrast  11/09/2015  CLINICAL DATA:  Preoperative evaluation, prior to surgical treatment of nonischemic cardiomyopathy and left ventricular thrombus. Initial encounter. EXAM: CT CHEST, ABDOMEN AND PELVIS WITHOUT CONTRAST TECHNIQUE: Multidetector CT imaging of the chest, abdomen and pelvis was performed following the standard protocol without IV contrast. COMPARISON:  None. FINDINGS: CT CHEST Patchy peripheral opacities within the right upper and middle lobes  likely reflect scarring, though mild infection could have a similar appearance. The lungs are otherwise grossly clear. No pleural effusion or pneumothorax is seen. No masses are identified. Minimal blebs are noted at the lung apices. The heart is mildly enlarged, with left ventricular enlargement. A right PICC is noted ending about the proximal SVC. No pericardial effusion is identified. The great vessels are grossly unremarkable in appearance. No mediastinal lymphadenopathy is seen. The visualized portions of thyroid gland are unremarkable. No axillary lymphadenopathy is seen. No acute osseous abnormalities are identified. CT ABDOMEN AND PELVIS The liver and spleen are  unremarkable in appearance. The gallbladder is within normal limits. The pancreas and adrenal glands are unremarkable. The kidneys are unremarkable in appearance. There is no evidence of hydronephrosis. No renal or ureteral stones are seen. No perinephric stranding is appreciated. No free fluid is identified. The small bowel is unremarkable in appearance. The stomach is within normal limits. No acute vascular abnormalities are seen. The appendix is normal in caliber, without evidence of appendicitis. The colon is grossly unremarkable in appearance. The bladder is mildly distended and grossly remarkable. The prostate remains normal in size. No inguinal lymphadenopathy is seen. No acute osseous abnormalities are identified. IMPRESSION: 1. Patchy peripheral opacities within the right upper and middle lobes likely reflect scarring, though mild infection could have a similar appearance. 2. Mild cardiomegaly, with left ventricular enlargement. 3. No acute abnormality seen within the abdomen or pelvis. Electronically Signed   By: Roanna Raider M.D.   On: 11/09/2015 20:00   Mr Card Morphology Wo/w Cm  11/09/2015  CLINICAL DATA:  Nonischemic cardiomyopathy EXAM: CARDIAC MRI TECHNIQUE: The patient was scanned on a 1.5 Tesla GE magnet. A dedicated cardiac coil was used. Functional imaging was done using Fiesta sequences. 2,3, and 4 chamber views were done to assess for RWMA's. Modified Simpson's rule using a short axis stack was used to calculate an ejection fraction on a dedicated work [employer]. The patient received 25 cc of Multihance. After 10 minutes inversion recovery sequences were used to assess for infiltration and scar tissue. CONTRAST:  25 cc Multihance FINDINGS: Limited images of the lung fields showed no significant abnormalities. Markedly dilated left ventricle with relatively thin walls. Diffuse severe hypokinesis, EF 8%. There was a small LV thrombus adherent to the mid anterior wall.  The right ventricle was mildly dilated with moderately decreased systolic function. There was mild to moderate central mitral regurgitation. Trileaflet aortic valve with no significant stenosis or regurgitation. Moderate left atrial enlargement. Mild right atrial enlargement. On delayed enhancement imaging, there was patchy late gadolinium enhancement (LGE) in the inferior wall. There was a small area of full thickness LGE in the basal inferior wall. There was a small area of near-full thickness LGE in the mid inferior wall. Finally, there were a couple of small areas of < 25% thickness subendocardial LGE in the apical inferior wall. MEASUREMENTS: LVEDV 533 mL LV SV 42 mL LV EF 8% IMPRESSION: 1. Markedly dilated left ventricle with very severe diffuse hypokinesis, EF 8%. 2.  Mildly dilated RV with moderately decreased systolic function. 3.  Small LV mid anterior wall thrombus. 4. Patchy inferior LGE pattern as described above. This is of uncertain significance, could be related to prior myocarditis. Erinn Mendosa Electronically Signed   By: [name] M.D.   On: 11/09/2015 00:43    PHYSICAL EXAM CVP ~2  General: NAD. Sitting in chair.  Neck: JVP flat, no thyromegaly or thyroid nodule.  Lungs: Clear to auscultation bilaterally with normal respiratory effort. CV: Lateral PMI.  Heart regular S1/S2, loud s3 no murmur.  No edema.   Abdomen: Soft, nontender, no hepatosplenomegaly, no distention.  Neurologic: Alert and oriented x 3.  Psych: Normal affect. Extremities: No clubbing or cyanosis.   TELEMETRY: Reviewed telemetry pt in NSR 100s  ASSESSMENT AND PLAN: 60 yo with history of nonischemic cardiomyopathy and apparently a prior LV thrombus presented with acute/chronic systolic CHF. He is followed by a cardiologist in [place].  1. Acute/chronic systolic CHF -> cardiogenic shock:  Nonischemic cardiomyopathy.  Cause uncertain => not a heavy drinker, had one sister with what sounds like sudden death.   Possible viral myocarditis. EF has been very low per his report, do not have echo done yet here.  Had chest pain and elevated TnI at admission, so had LHC/RHC 4/24.  No coronary disease noted, no evidence for cardioembolism from LV thrombus either.  RHC showed markedly elevated filling pressures and low cardiac index at 1.39.  He was started on milrinone and IV Lasix.  Cardiac MRI was done, showing severe LV dilation with EF 8%, small LV thrombus, and mildly dilated RV with moderately decreased systolic function.  There was a patchy LGE pattern in the inferior wall, ?prior myocarditis.  HIV negative. - Milrinone restarted 4/28 now at 0.25. Co-ox 61% - Volume status low. Hold torsemide today.  - Continue current digoxin. Dig level 0.3   - Cut spiro back to 12.5 mg daily.    - Continue lisinopril to 2.5 mg daily (off evening dose with soft BP overnight).  - No beta blocker with low output.   - M-spike noted on SPEP.  Serum immunofixation negative. Does not look like cardiac amyloidosis on MRI.  - He has a LBBB and no ICD (pending Medicaid).  -  Mr Obryon is lmilrinone-dependent.  He has no device.  He has Medicaid pending.  We are proceeding with LVAD workup and he has been seen by surgeon. Keep in house until VAD.   2. LV thrombus: Small thrombus on MRI.  On heparin, INR 1.40. Off  warfarin and cover with heparin pending possible surgery.  3. Anemia: unclear source. Todays hemoglobin is stable. Continue Protonix  Consult GI for EGD/colonoscopy prior to VAD.  Set up RHC for Friday.   Amy Clegg NP-C  11/16/2015 7:28 AM   Patient seen with NP, agree with the above note.  He is stable today.  BP remains soft.  CVP not elevated so off torsemide.    We have had ongoing discussions about his LVAD candidacy.  I think that social issues and insurance issues have been worked out, would plan implant this admission.  GI to consult today for pre-VAD workup, will need EGD/c-scope likely.  Will discuss repeat RHC  with Dr Donata Clay.   He is on heparin gtt while off warfarin given LV thrombus.   [name] 11/16/2015 8:17 AM

## 2015-11-16 NOTE — Progress Notes (Signed)
ANTICOAGULATION CONSULT NOTE  Pharmacy Consult for Heparin Indication: LV thrombus  No Known Allergies  Patient Measurements: Height: 5\' 11"  (180.3 cm) Weight: 159 lb 13.3 oz (72.5 kg) IBW/kg (Calculated) : 75.3  Vital Signs: Temp: 98.3 F (36.8 C) (05/03 0312) Temp Source: Oral (05/03 0312) BP: 102/76 mmHg (05/03 0312)  Labs:  Recent Labs  11/13/15 0500  11/14/15 0500  11/14/15 1110  11/14/15 1825 11/15/15 0532 11/16/15 0335  HGB 9.0*  < > 12.3*  --   --   --   --  11.8* 11.3*  HCT 27.4*  < > 37.7*  --   --   --   --  37.2* 34.9*  PLT 185  < > 285  --   --   --   --  314 303  LABPROT 22.4*  --  19.7*  --   --   --   --  19.7* 17.2*  INR 1.98*  --  1.67*  --   --   --   --  1.67* 1.40  HEPARINUNFRC 0.23*  < >  --   < >  --   < > 0.53 0.48 0.79*  CREATININE 0.85  --   --   --  1.01  --   --  1.02  --   < > = values in this interval not displayed.  Estimated Creatinine Clearance: 81 mL/min (by C-G formula based on Cr of 1.02).  Assessment: 60 yo male with LV thrombus, awaiting LVAD for heparin Goal of Therapy:  Heparin level 0.3-0.7 units/ml Monitor platelets by anticoagulation protocol: Yes   Plan:  Decrease Heparin 1250 units/hr Check heparin level in 8 hours.   Geannie Risen, PharmD, BCPS  11/16/2015 4:33 AM

## 2015-11-16 NOTE — Progress Notes (Signed)
ANTICOAGULATION CONSULT NOTE - Follow Up Consult  Pharmacy Consult for Heparin Indication: LV thrombus  No Known Allergies  Patient Measurements: Height: 5\' 11"  (180.3 cm) Weight: 161 lb (73.029 kg) IBW/kg (Calculated) : 75.3  Vital Signs: Temp: 98 F (36.7 C) (05/03 1100) Temp Source: Oral (05/03 1100) BP: 86/73 mmHg (05/03 1200) Pulse Rate: 100 (05/03 1200)  Labs:  Recent Labs  11/14/15 0500  11/14/15 1110  11/15/15 0532 11/16/15 0335 11/16/15 1223  HGB 12.3*  --   --   --  11.8* 11.3*  --   HCT 37.7*  --   --   --  37.2* 34.9*  --   PLT 285  --   --   --  314 303  --   LABPROT 19.7*  --   --   --  19.7* 17.2*  --   INR 1.67*  --   --   --  1.67* 1.40  --   HEPARINUNFRC  --   < >  --   < > 0.48 0.79* 0.77*  CREATININE  --   --  1.01  --  1.02 1.04  --   < > = values in this interval not displayed.  Estimated Creatinine Clearance: 79.9 mL/min (by C-G formula based on Cr of 1.04).   Medications:  Heparin @ 1250 units/hr  Assessment: 60yom on coumadin pta for hx LV thrombus, transitioned to IV heparin on admission pending cath. S/P cath 4/24, found to have NICM, coumadin resumed with heparin bridge. On 4/30, INR was 1.98 and heparin was stopped due to drop in H/H - repeat H/H was better and remains stable today. Patient is now anticipating LVAD this admission so coumadin held and heparin bridge restarted 5/1.  Heparin level was high this morning and rate decreased. Follow up heparin level remains elevated at 0.77.   Goal of Therapy:  Heparin level 0.3-0.7 units/ml Monitor platelets by anticoagulation protocol: Yes   Plan:  1) Decrease heparin further to 1100 units/hr 2) Will not recheck level as heparin to be turned off at 0300 on 5/4 for GI procedures  Fredrik Rigger 11/16/2015,12:56 PM

## 2015-11-16 NOTE — Progress Notes (Signed)
   Called by nursing staff. SBP < 80.CVP is 5. Stop lisinopril.   Check CO-OX now. Add 3 mcg norepi now.   Amy Clegg  NP-C   2:59 PM

## 2015-11-16 NOTE — Consult Note (Addendum)
                                                                           [facility] Consult: 10:17 AM 11/16/2015  LOS: 11 days    Referring Provider: Dr McLean  Primary Care Physician:  Care providers in [place].  Primary Gastroenterologist:  unassigned     Reason for Consultation:  EGD pre VAD placement.    HPI: Matthew Vaughan is a 60 y.o. male.  Residence is in [place] but was visiting family in [place] when he came to hospital.  Hx non-ischemic CM.  EF ~ 20% historically.  HTN.  Chronic Coumadin for hx LV thrombus.  S/p cath in 2015.  No previous GI issues and never had EGD or colonoscopy.      His exercise tolerance, anorexia, weight loss, DOE, peripheral edema all severely progressed over the past 2-3 months. Admitted 4/22 with NSTEMI, cardiogenic shock, decompensated CHF, AKI (improved).   Left heart cath: 4/24:  Elevated right and left heart filling pressures and low cardiac output. Normal coronaries, nonischemic cardiomyopathy.  By echo LVEF is 10 to 15%.   Cardiac MRI shows severe LV dilatation and EF 8%, small LV thrombus.   Placed on milrinone, diuretics.  Dr Van Trigt aiming for LVAD placement in a week or so. He wants pt to have EGD preop.  Has been transitioned off Coumadin to Heparin.   Pt has become anemic, Hgb initially 11.5 to 12.5. On 4/29 dropped to 9.0, from 12.6 the previous day.  On 4/30 Hgb back to 13. since then drifting slowly to 11.3.  She has received no transfusions.  MCVs elevated from the start: max 104.   No supratherapeutic coags. Pt denies ETOH.  Hep B and C Ab/Ag and HIV negative.  Platelet and WBCs ok.     CT abd/pelvis without contrast 4/26: Liver, spleen, GB, bile ducts, kidneys, intestine unremarkable.  Weight dropped from ~ 195 to 152 over about 7 months due to anorexia.  Stools brown, less regular along with diminished po intake.  Since  admission appetite improved and having daily BMs.  Limited nausea earlier in admission, now resolved.  No abdominal pain.  No unusual bleeding or bruising.  No hx of anemia or transfusions.  No ETOH since 2009, never was heavy drinker.  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    Past Medical History  Diagnosis Date  . CHF (congestive heart failure) (HCC)   . Hypertension   . Left ventricular thrombus (HCC)     Past Surgical History  Procedure Laterality Date  . Cardiac catheterization N/A 11/07/2015    Procedure: Left Heart Cath and Coronary Angiography;  Surgeon: Dalton S McLean, MD;  Location: [facility] INVASIVE CV LAB;  Service: Cardiovascular;  Laterality: N/A;    Prior to Admission medications   Medication Sig Start Date End Date Taking? Authorizing Provider  LASIX 40 MG tablet Take 40-80 mg by mouth See admin instructions. Alternate taking 1 tablet one day then take 2 tablets the next day 10/25/15  Yes Historical Provider, MD  metoprolol tartrate (LOPRESSOR) 25 MG tablet Take 12.5 mg   by mouth daily.   Yes Historical Provider, MD  warfarin (COUMADIN) 2.5 MG tablet Take 2.5 mg by mouth daily. 10/13/15  Yes Historical Provider, MD    Scheduled Meds: . digoxin  0.125 mg Oral Daily  . feeding supplement (ENSURE ENLIVE)  237 mL Oral BID BM  . lisinopril  2.5 mg Oral Daily  . sodium chloride flush  10-40 mL Intracatheter Q12H  . sodium chloride flush  3 mL Intravenous Q12H  . sodium chloride flush  3 mL Intravenous Q12H  . spironolactone  12.5 mg Oral Daily   Infusions: . heparin 1,250 Units (11/16/15 0800)  . milrinone 0.25 mcg/kg/min (11/16/15 0800)   PRN Meds: sodium chloride, sodium chloride, acetaminophen, nitroGLYCERIN, ondansetron (ZOFRAN) IV, sodium chloride flush, sodium chloride flush, sodium chloride flush   Allergies as of 11/04/2015  . (No Known Allergies)     History reviewed. No pertinent family history.  Social History   Social History  . Marital Status: Divorced    Spouse Name: N/A  . Number of Children: N/A  . Years of Education: N/A   Occupational History  . Not on file.   Social History Main Topics  . Smoking status: Former Smoker  . Smokeless tobacco: Not on file  . Alcohol Use: No  . Drug Use: No  . Sexual Activity: Not on file   Other Topics Concern  . Not on file   Social History Narrative    REVIEW OF SYSTEMS: See HPI.     PHYSICAL EXAM: Vital signs in last 24 hours: Filed Vitals:   11/16/15 0944 11/16/15 0947  BP:  90/70  Pulse: 96   Temp:    Resp:     Wt Readings from Last 3 Encounters:  11/16/15 73.029 kg (161 lb)    General: pleasant, comfortable, looks well.  Head:  No swelling or asymmetry  Eyes:  No icterus or pallor Ears:  Not HOH  Nose:  No congestion or discharge Mouth:  Moist, clear , excellent dentition Neck:  No mass.  No JVD Lungs:  Clear bil.  No labored resps.  Some dry cough. Heart: RRR.  +S3.  No murmur Abdomen:  Soft, NT.  ND.  No mass, bruit, HSM.  Active BS Rectal: not performed   Musc/Skeltl: no joint swelling or deformity.   Extremities:  No CCE  Neurologic:  Fully alert, oriented x 3.  No gross deficits or weakness.  Walking easily in hallway.  Skin:  Lesions on arms and torso: various states of healing.  Look like pustules that have resolved.  Tattoos:  None seen   Psych:  Calm, pleasant, cooperative, intelligent.   Intake/Output from previous day: 05/02 0701 - 05/03 0700 In: 814.1 [P.O.:360; I.V.:454.1] Out: 1975 [Urine:1975] Intake/Output this shift: Total I/O In: 17.9 [I.V.:17.9] Out: 500 [Urine:500]  LAB RESULTS:  Recent Labs  11/14/15 0500 11/15/15 0532 11/16/15 0335  WBC 4.1 4.4 4.2  HGB 12.3* 11.8* 11.3*  HCT 37.7* 37.2* 34.9*  PLT 285 314 303   BMET Lab Results  Component Value Date   NA 136 11/16/2015   NA 132* 11/15/2015   NA 131*  11/14/2015   K 4.7 11/16/2015   K 4.4 11/15/2015   K 4.2 11/14/2015   CL 104 11/16/2015   CL 100* 11/15/2015   CL 98* 11/14/2015   CO2 25 11/16/2015   CO2 25 11/15/2015   CO2 26 11/14/2015   GLUCOSE 112* 11/16/2015   GLUCOSE 118* 11/15/2015   GLUCOSE 97 11/14/2015     BUN 16 11/16/2015   BUN 20 11/15/2015   BUN 16 11/14/2015   CREATININE 1.04 11/16/2015   CREATININE 1.02 11/15/2015   CREATININE 1.01 11/14/2015   CALCIUM 9.2 11/16/2015   CALCIUM 9.1 11/15/2015   CALCIUM 9.1 11/14/2015   LFT  Recent Labs  11/16/15 0335  PROT 7.5  ALBUMIN 2.8*  AST 58*  ALT 70*  ALKPHOS 98  BILITOT 1.0   PT/INR Lab Results  Component Value Date   INR 1.40 11/16/2015   INR 1.67* 11/15/2015   INR 1.67* 11/14/2015    RADIOLOGY STUDIES: No results found.  ENDOSCOPIC STUDIES: none  IMPRESSION:   *  Advanced, non-ischemic heart failure, milrinone dependent.  LVAD surgery set up for 5/9 Clinically improved with current medical mgt: anorexia, energy, dyspnea: all better.   *  Macrocytosis. Minor anemia.    *  Chronic Coumadin for hx LV thrombus.  Now on IV heparin.   *  No previous colonoscopic screening in 60 y/o.  No warning signs of GI bleeding or of neoplasm.   PLAN:     *  Check folate and B 12 levels in AM   *  Will set pt up for Colonoscopy and EGD tomorrow AM at 9.  Need to make sure the colon is ok with heart team.  Also would be best if scoping could be done off Heparin (Heparin window) to allow for polypectomy, biopsy etc. This will need to be ok'd by heart team as well.  Discussed procedures, sedation, risks (bleeding, perforation, reaction to anesthesia)  with pt and he is agreeable to proceed.    Sarah Gribbin  11/16/2015, 10:17 AM Pager: [phone]    Attending physician's note   I have taken a history, examined the patient and reviewed the chart. I agree with the Advanced Practitioner's note, impression and recommendations.60 yr M with severe CHF and LV  thrombus on anticoagulation, is being considered for possible LVAD . He has no speciifc GI complaints. Noted mild anemia. He has'nt had screening colonoscopy, will plan for EGD and colonoscopy. The risks and benefits as well as alternatives of endoscopic procedure(s) have been discussed and reviewed. All questions answered. The patient agrees to proceed. Discussed with Dr Mclean regarding his anticoagulation status and he is ok with holding heparin gtt 6 hrs prior to the procedure   K Veena Fordyce Lepak, MD [phone] Mon-Fri 8a-5p [phone] after 5p, weekends, holidays  

## 2015-11-16 NOTE — Progress Notes (Signed)
*  PRELIMINARY RESULTS* Vascular Ultrasound Lower extremity venous duplex has been completed.  Preliminary findings: No evidence of DVT or baker's cyst.   Farrel Demark, RDMS, RVT  11/16/2015, 12:11 PM

## 2015-11-16 NOTE — Progress Notes (Signed)
CARDIAC REHAB PHASE I   PRE:  Rate/Rhythm: 101 ST with PVCs    BP: sitting 88/68    SaO2:   MODE:  Ambulation: 700 ft   POST:  Rate/Rhythm: 119 ST    BP: sitting 98/73     SaO2:   Tolerated well, no c/o. Shorter walk today due to someone needing to see him. Encouraged practicing IS. [phone]   Harriet Masson CES, ACSM 11/16/2015 11:08 AM

## 2015-11-17 ENCOUNTER — Encounter (HOSPITAL_COMMUNITY)
Admission: EM | Disposition: A | Discharge status: Home Health | Payer: Self-pay | Source: Home / Self Care | Attending: Cardiovascular Disease

## 2015-11-17 ENCOUNTER — Inpatient Hospital Stay (HOSPITAL_COMMUNITY): Payer: Medicaid Other | Admitting: Anesthesiology

## 2015-11-17 ENCOUNTER — Encounter (HOSPITAL_COMMUNITY): Payer: Self-pay

## 2015-11-17 ENCOUNTER — Inpatient Hospital Stay (HOSPITAL_COMMUNITY): Payer: Medicaid Other

## 2015-11-17 DIAGNOSIS — D508 Other iron deficiency anemias: Secondary | ICD-10-CM | POA: Insufficient documentation

## 2015-11-17 DIAGNOSIS — Z01818 Encounter for other preprocedural examination: Secondary | ICD-10-CM | POA: Insufficient documentation

## 2015-11-17 DIAGNOSIS — D509 Iron deficiency anemia, unspecified: Secondary | ICD-10-CM | POA: Insufficient documentation

## 2015-11-17 DIAGNOSIS — Z1211 Encounter for screening for malignant neoplasm of colon: Secondary | ICD-10-CM | POA: Insufficient documentation

## 2015-11-17 HISTORY — PX: ESOPHAGOGASTRODUODENOSCOPY: SHX5428

## 2015-11-17 HISTORY — PX: COLONOSCOPY: SHX5424

## 2015-11-17 LAB — BASIC METABOLIC PANEL
ANION GAP: 8 (ref 5–15)
BUN: 12 mg/dL (ref 6–20)
CALCIUM: 9.2 mg/dL (ref 8.9–10.3)
CO2: 21 mmol/L — AB (ref 22–32)
CREATININE: 0.93 mg/dL (ref 0.61–1.24)
Chloride: 101 mmol/L (ref 101–111)
Glucose, Bld: 259 mg/dL — ABNORMAL HIGH (ref 65–99)
Potassium: 5.2 mmol/L — ABNORMAL HIGH (ref 3.5–5.1)
SODIUM: 130 mmol/L — AB (ref 135–145)

## 2015-11-17 LAB — CARBOXYHEMOGLOBIN
CARBOXYHEMOGLOBIN: 2.1 % — AB (ref 0.5–1.5)
CARBOXYHEMOGLOBIN: 2.1 % — AB (ref 0.5–1.5)
METHEMOGLOBIN: 0.6 % (ref 0.0–1.5)
METHEMOGLOBIN: 0.5 % (ref 0.0–1.5)
O2 SAT: 91.8 %
O2 Saturation: 59.8 %
Total hemoglobin: 11.7 g/dL — ABNORMAL LOW (ref 13.5–18.0)
Total hemoglobin: 12.2 g/dL — ABNORMAL LOW (ref 13.5–18.0)

## 2015-11-17 LAB — PROTIME-INR
INR: 1.27 (ref 0.00–1.49)
Prothrombin Time: 16.1 seconds — ABNORMAL HIGH (ref 11.6–15.2)

## 2015-11-17 LAB — HEPARIN LEVEL (UNFRACTIONATED): Heparin Unfractionated: 0.14 IU/mL — ABNORMAL LOW (ref 0.30–0.70)

## 2015-11-17 SURGERY — COLONOSCOPY
Anesthesia: General

## 2015-11-17 MED ORDER — SODIUM CHLORIDE 0.9 % IV SOLN
INTRAVENOUS | Status: DC | PRN
  Administered 2015-11-17: 250 mL via INTRAVENOUS
  Administered 2015-11-18: 500 mL via INTRAVENOUS

## 2015-11-17 MED ORDER — FENTANYL CITRATE (PF) 100 MCG/2ML IJ SOLN
INTRAMUSCULAR | Status: DC | PRN
  Administered 2015-11-17: 50 ug via INTRAVENOUS

## 2015-11-17 MED ORDER — SPIRONOLACTONE 25 MG PO TABS
12.5000 mg | ORAL_TABLET | Freq: Every day | ORAL | Status: DC
  Administered 2015-11-18 – 2015-11-20 (×3): 12.5 mg via ORAL
  Filled 2015-11-17 (×3): qty 1

## 2015-11-17 MED ORDER — DEXMEDETOMIDINE HCL IN NACL 200 MCG/50ML IV SOLN
INTRAVENOUS | Status: DC | PRN
  Administered 2015-11-17: 0.7 ug/kg/h via INTRAVENOUS

## 2015-11-17 MED ORDER — SODIUM CHLORIDE 0.9 % IV SOLN: INTRAVENOUS | Status: DC

## 2015-11-17 MED ORDER — SODIUM CHLORIDE 0.9 % IV SOLN
INTRAVENOUS | Status: DC
  Administered 2015-11-18: via INTRAVENOUS

## 2015-11-17 MED ORDER — LACTATED RINGERS IV SOLN
INTRAVENOUS | Status: DC | PRN
  Administered 2015-11-17: 11:00:00 via INTRAVENOUS

## 2015-11-17 MED ORDER — SODIUM CHLORIDE 0.9 % IV SOLN: 250.0000 mL | INTRAVENOUS | Status: DC | PRN

## 2015-11-17 MED ORDER — PROPOFOL 10 MG/ML IV BOLUS
INTRAVENOUS | Status: DC | PRN
  Administered 2015-11-17: 20 mg via INTRAVENOUS

## 2015-11-17 MED ORDER — EPINEPHRINE HCL 1 MG/ML IJ SOLN
0.5000 ug/min | INTRAVENOUS | Status: AC
  Administered 2015-11-17: 2 ug/min via INTRAVENOUS
  Filled 2015-11-17: qty 4

## 2015-11-17 MED ORDER — MIDAZOLAM HCL 5 MG/5ML IJ SOLN
INTRAMUSCULAR | Status: DC | PRN
Start: 2015-11-17 — End: 2015-11-17
  Administered 2015-11-17: 2 mg via INTRAVENOUS

## 2015-11-17 MED ORDER — HEPARIN (PORCINE) IN NACL 100-0.45 UNIT/ML-% IJ SOLN
1400.0000 [IU]/h | INTRAMUSCULAR | Status: AC
  Administered 2015-11-17: 1100 [IU]/h via INTRAVENOUS
  Administered 2015-11-18: 1400 [IU]/h via INTRAVENOUS
  Filled 2015-11-17: qty 250

## 2015-11-17 MED ORDER — DEXMEDETOMIDINE HCL 200 MCG/2ML IV SOLN
INTRAVENOUS | Status: DC | PRN
  Administered 2015-11-17: 36 ug via INTRAVENOUS

## 2015-11-17 MED ORDER — SODIUM CHLORIDE 0.9% FLUSH: 3.0000 mL | Freq: Two times a day (BID) | INTRAVENOUS | Status: DC

## 2015-11-17 MED ORDER — DEXTROSE 5 % IV SOLN
0.0000 ug/min | INTRAVENOUS | Status: AC
  Administered 2015-11-17: 4 ug/min via INTRAVENOUS
  Filled 2015-11-17: qty 4

## 2015-11-17 MED ORDER — SODIUM CHLORIDE 0.9% FLUSH: 3.0000 mL | INTRAVENOUS | Status: DC | PRN

## 2015-11-17 MED ORDER — PHENYLEPHRINE HCL 10 MG/ML IJ SOLN
10.0000 mg | INTRAVENOUS | Status: DC | PRN
  Administered 2015-11-17: 20 ug/min via INTRAVENOUS

## 2015-11-17 NOTE — Progress Notes (Signed)
ANTICOAGULATION CONSULT NOTE - Follow Up Consult  Pharmacy Consult for Heparin Indication: LV thrombus  No Known Allergies  Patient Measurements: Height: 5\' 11"  (180.3 cm) Weight: 159 lb 13.3 oz (72.5 kg) IBW/kg (Calculated) : 75.3  Vital Signs: Temp: 97.9 F (36.6 C) (05/04 1900) Temp Source: Oral (05/04 1900) BP: 90/75 mmHg (05/04 1900) Pulse Rate: 85 (05/04 1500)  Labs:  Recent Labs  11/15/15 0532 11/16/15 0335 11/16/15 1223 11/17/15 0400 11/17/15 2020  HGB 11.8* 11.3*  --   --   --   HCT 37.2* 34.9*  --   --   --   PLT 314 303  --   --   --   LABPROT 19.7* 17.2*  --  16.1*  --   INR 1.67* 1.40  --  1.27  --   HEPARINUNFRC 0.48 0.79* 0.77*  --  0.14*  CREATININE 1.02 1.04  --  0.93  --     Estimated Creatinine Clearance: 88.8 mL/min (by C-G formula based on Cr of 0.93).  Assessment: 60yom on coumadin pta for hx LV thrombus, transitioned to IV heparin on admission pending cath. S/P cath 4/24, found to have NICM, coumadin resumed with heparin bridge. On 4/30, INR was 1.98 and heparin was stopped due to drop in H/H - repeat H/H was better. Patient is now anticipating LVAD this admission so coumadin held and heparin bridge restarted 5/1.  He underwent colonoscopy and EGD today. Colonoscopy showed non-bleeding internal hemorrhoids and EGD was unremarkable. OK to resume heparin today per GI.  Heparin level after restart is low at 0.14 on 1100 units/hr -- this is a 5.5hr level. H/H was wnl, INR 1.27. No bleeding or issues with line per RN.   Goal of Therapy:  Heparin level 0.3-0.7 units/ml Monitor platelets by anticoagulation protocol: Yes   Plan:  1) Increase heparin to 1300 units/hr 2) Check 6 hour heparin level 3) Daily heparin level and CBC  Link Snuffer, PharmD, BCPS Clinical Pharmacist [phone]  11/17/2015,9:00 PM

## 2015-11-17 NOTE — Anesthesia Preprocedure Evaluation (Signed)
Anesthesia Evaluation  Patient identified by MRN, date of birth, ID band Patient awake    Reviewed: Allergy & Precautions, NPO status , Patient's Chart, lab work & pertinent test results  Airway Mallampati: II  TM Distance: >3 FB Neck ROM: Full    Dental  (+) Teeth Intact, Dental Advisory Given   Pulmonary former smoker,    breath sounds clear to auscultation       Cardiovascular hypertension,  Rhythm:Regular Rate:Normal     Neuro/Psych    GI/Hepatic   Endo/Other    Renal/GU      Musculoskeletal   Abdominal   Peds  Hematology   Anesthesia Other Findings   Reproductive/Obstetrics                             Anesthesia Physical Anesthesia Plan  ASA: III  Anesthesia Plan: General   Post-op Pain Management:    Induction: Intravenous  Airway Management Planned: Natural Airway and Simple Face Mask  Additional Equipment:   Intra-op Plan:   Post-operative Plan:   Informed Consent: I have reviewed the patients History and Physical, chart, labs and discussed the procedure including the risks, benefits and alternatives for the proposed anesthesia with the patient or authorized representative who has indicated his/her understanding and acceptance.   Dental advisory given  Plan Discussed with: CRNA and Anesthesiologist  Anesthesia Plan Comments: (Nonischemic cardiomyopathy on milrinone undergoing LVAD workup EF 10-15% H/O possible LV thrombus Labs OK  Plan MAC )        Anesthesia Quick Evaluation

## 2015-11-17 NOTE — H&P (View-Only) (Signed)
[facility] Consult: 10:17 AM 11/16/2015  LOS: 11 days    Referring Provider: Dr Shirlee Latch  Primary Care Physician:  Care providers in [facility].  Primary Gastroenterologist:  unassigned     Reason for Consultation:  EGD pre VAD placement.    HPI: Matthew Vaughan is a 60 y.o. male.  Residence is in [place] but was visiting family in [place] when he came to hospital.  Hx non-ischemic CM.  EF ~ 20% historically.  HTN.  Chronic Coumadin for hx LV thrombus.  S/p cath in 2015.  No previous GI issues and never had EGD or colonoscopy.      His exercise tolerance, anorexia, weight loss, DOE, peripheral edema all severely progressed over the past 2-3 months. Admitted 4/22 with NSTEMI, cardiogenic shock, decompensated CHF, AKI (improved).   Left heart cath: 4/24:  Elevated right and left heart filling pressures and low cardiac output. Normal coronaries, nonischemic cardiomyopathy.  By echo LVEF is 10 to 15%.   Cardiac MRI shows severe LV dilatation and EF 8%, small LV thrombus.   Placed on milrinone, diuretics.  Dr Donata Clay aiming for LVAD placement in a week or so. He wants pt to have EGD preop.  Has been transitioned off Coumadin to Heparin.   Pt has become anemic, Hgb initially 11.5 to 12.5. On 4/29 dropped to 9.0, from 12.6 the previous day.  On 4/30 Hgb back to 13. since then drifting slowly to 11.3.  She has received no transfusions.  MCVs elevated from the start: max 104.   No supratherapeutic coags. Pt denies ETOH.  Hep B and C Ab/Ag and HIV negative.  Platelet and WBCs ok.     CT abd/pelvis without contrast 4/26: Liver, spleen, GB, bile ducts, kidneys, intestine unremarkable.  Weight dropped from ~ 195 to 152 over about 7 months due to anorexia.  Stools brown, less regular along with diminished po intake.  Since  admission appetite improved and having daily BMs.  Limited nausea earlier in admission, now resolved.  No abdominal pain.  No unusual bleeding or bruising.  No hx of anemia or transfusions.  No ETOH since 2009, never was heavy drinker.  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    Past Medical History  Diagnosis Date  . CHF (congestive heart failure) (HCC)   . Hypertension   . Left ventricular thrombus [facility])     Past Surgical History  Procedure Laterality Date  . Cardiac catheterization N/A 11/07/2015    Procedure: Left Heart Cath and Coronary Angiography;  Surgeon: Laurey Morale, MD;  Location: [facility] INVASIVE CV LAB;  Service: Cardiovascular;  Laterality: N/A;    Prior to Admission medications   Medication Sig Start Date End Date Taking? Authorizing Provider  LASIX 40 MG tablet Take 40-80 mg by mouth See admin instructions. Alternate taking 1 tablet one day then take 2 tablets the next day 10/25/15  Yes Historical Provider, MD  metoprolol tartrate (LOPRESSOR) 25 MG tablet Take 12.5 mg  by mouth daily.   Yes Historical Provider, MD  warfarin (COUMADIN) 2.5 MG tablet Take 2.5 mg by mouth daily. 10/13/15  Yes Historical Provider, MD    Scheduled Meds: . digoxin  0.125 mg Oral Daily  . feeding supplement (ENSURE ENLIVE)  237 mL Oral BID BM  . lisinopril  2.5 mg Oral Daily  . sodium chloride flush  10-40 mL Intracatheter Q12H  . sodium chloride flush  3 mL Intravenous Q12H  . sodium chloride flush  3 mL Intravenous Q12H  . spironolactone  12.5 mg Oral Daily   Infusions: . heparin 1,250 Units (11/16/15 0800)  . milrinone 0.25 mcg/kg/min (11/16/15 0800)   PRN Meds: sodium chloride, sodium chloride, acetaminophen, nitroGLYCERIN, ondansetron (ZOFRAN) IV, sodium chloride flush, sodium chloride flush, sodium chloride flush   Allergies as of 11/04/2015  . (No Known Allergies)     History reviewed. No pertinent family history.  Social History   Social History  . Marital Status: Divorced    Spouse Name: N/A  . Number of Children: N/A  . Years of Education: N/A   Occupational History  . Not on file.   Social History Main Topics  . Smoking status: Former Games developer  . Smokeless tobacco: Not on file  . Alcohol Use: No  . Drug Use: No  . Sexual Activity: Not on file   Other Topics Concern  . Not on file   Social History Narrative    REVIEW OF SYSTEMS: See HPI.     PHYSICAL EXAM: Vital signs in last 24 hours: Filed Vitals:   11/16/15 0944 11/16/15 0947  BP:  90/70  Pulse: 96   Temp:    Resp:     Wt Readings from Last 3 Encounters:  11/16/15 73.029 kg (161 lb)    General: pleasant, comfortable, looks well.  Head:  No swelling or asymmetry  Eyes:  No icterus or pallor Ears:  Not HOH  Nose:  No congestion or discharge Mouth:  Moist, clear , excellent dentition Neck:  No mass.  No JVD Lungs:  Clear bil.  No labored resps.  Some dry cough. Heart: RRR.  +S3.  No murmur Abdomen:  Soft, NT.  ND.  No mass, bruit, HSM.  Active BS Rectal: not performed   Musc/Skeltl: no joint swelling or deformity.   Extremities:  No CCE  Neurologic:  Fully alert, oriented x 3.  No gross deficits or weakness.  Walking easily in hallway.  Skin:  Lesions on arms and torso: various states of healing.  Look like pustules that have resolved.  Tattoos:  None seen   Psych:  Calm, pleasant, cooperative, intelligent.   Intake/Output from previous day: 05/02 0701 - 05/03 0700 In: 814.1 [P.O.:360; I.V.:454.1] Out: 1975 [Urine:1975] Intake/Output this shift: Total I/O In: 17.9 [I.V.:17.9] Out: 500 [Urine:500]  LAB RESULTS:  Recent Labs  11/14/15 0500 11/15/15 0532 11/16/15 0335  WBC 4.1 4.4 4.2  HGB 12.3* 11.8* 11.3*  HCT 37.7* 37.2* 34.9*  PLT 285 314 303   BMET Lab Results  Component Value Date   NA 136 11/16/2015   NA 132* 11/15/2015   NA 131*  11/14/2015   K 4.7 11/16/2015   K 4.4 11/15/2015   K 4.2 11/14/2015   CL 104 11/16/2015   CL 100* 11/15/2015   CL 98* 11/14/2015   CO2 25 11/16/2015   CO2 25 11/15/2015   CO2 26 11/14/2015   GLUCOSE 112* 11/16/2015   GLUCOSE 118* 11/15/2015   GLUCOSE 97 11/14/2015  BUN 16 11/16/2015   BUN 20 11/15/2015   BUN 16 11/14/2015   CREATININE 1.04 11/16/2015   CREATININE 1.02 11/15/2015   CREATININE 1.01 11/14/2015   CALCIUM 9.2 11/16/2015   CALCIUM 9.1 11/15/2015   CALCIUM 9.1 11/14/2015   LFT  Recent Labs  11/16/15 0335  PROT 7.5  ALBUMIN 2.8*  AST 58*  ALT 70*  ALKPHOS 98  BILITOT 1.0   PT/INR Lab Results  Component Value Date   INR 1.40 11/16/2015   INR 1.67* 11/15/2015   INR 1.67* 11/14/2015    RADIOLOGY STUDIES: No results found.  ENDOSCOPIC STUDIES: none  IMPRESSION:   *  Advanced, non-ischemic heart failure, milrinone dependent.  LVAD surgery set up for 5/9 Clinically improved with current medical mgt: anorexia, energy, dyspnea: all better.   *  Macrocytosis. Minor anemia.    *  Chronic Coumadin for hx LV thrombus.  Now on IV heparin.   *  No previous colonoscopic screening in 60 y/o.  No warning signs of GI bleeding or of neoplasm.   PLAN:     *  Check folate and B 12 levels in AM   *  Will set pt up for Colonoscopy and EGD tomorrow AM at 9.  Need to make sure the colon is ok with heart team.  Also would be best if scoping could be done off Heparin (Heparin window) to allow for polypectomy, biopsy etc. This will need to be ok'd by heart team as well.  Discussed procedures, sedation, risks (bleeding, perforation, reaction to anesthesia)  with pt and he is agreeable to proceed.    Jennye Moccasin  11/16/2015, 10:17 AM Pager: [phone]    Attending physician's note   I have taken a history, examined the patient and reviewed the chart. I agree with the Advanced Practitioner's note, impression and recommendations.27 yr M with severe CHF and LV  thrombus on anticoagulation, is being considered for possible LVAD . He has no speciifc GI complaints. Noted mild anemia. He has'nt had screening colonoscopy, will plan for EGD and colonoscopy. The risks and benefits as well as alternatives of endoscopic procedure(s) have been discussed and reviewed. All questions answered. The patient agrees to proceed. Discussed with Dr Shirlee Latch regarding his anticoagulation status and he is ok with holding heparin gtt 6 hrs prior to the procedure   K Scherry Ran, MD [phone] Mon-Fri 8a-5p [phone] after 5p, weekends, holidays

## 2015-11-17 NOTE — Interval H&P Note (Signed)
History and Physical Interval Note:  11/17/2015 9:29 AM  Matthew Vaughan  has presented today for surgery, with the diagnosis of anemia and pre-VAD screening.  The various methods of treatment have been discussed with the patient and family. After consideration of risks, benefits and other options for treatment, the patient has consented to  Procedure(s): COLONOSCOPY (N/A) ESOPHAGOGASTRODUODENOSCOPY (EGD) (N/A) as a surgical intervention .  The patient's history has been reviewed, patient examined, no change in status, stable for surgery.  I have reviewed the patient's chart and labs.  Questions were answered to the patient's satisfaction.     Kavitha Nandigam

## 2015-11-17 NOTE — Op Note (Signed)
[facility] Patient Name: Matthew Vaughan Procedure Date : 11/17/2015 MRN: [id number] Attending MD: Napoleon Form , MD Date of Birth: [birth year] CSN: [id number] Age: 60 Admit Type: Inpatient Procedure:                Colonoscopy Indications:              Screening for colorectal malignant neoplasm, This                            is the patient's first colonoscopy Providers:                Napoleon Form, MD, Sarah Monday, RN, Darletta Moll, Technician, Romie Minus, CRNA Referring MD:              Medicines:                Monitored Anesthesia Care Complications:            No immediate complications. Estimated Blood Loss:     Estimated blood loss: none. Procedure:                Pre-Anesthesia Assessment:                           - Prior to the procedure, a History and Physical                            was performed, and patient medications and                            allergies were reviewed. The patient's tolerance of                            previous anesthesia was also reviewed. The risks                            and benefits of the procedure and the sedation                            options and risks were discussed with the patient.                            All questions were answered, and informed consent                            was obtained. Prior Anticoagulants: The patient                            last took heparin on the day of the procedure. ASA                            Grade Assessment: IV - A patient with severe  systemic disease that is a constant threat to life.                            After reviewing the risks and benefits, the patient                            was deemed in satisfactory condition to undergo the                            procedure.                           After obtaining informed consent, the colonoscope                            was passed under  direct vision. Throughout the                            procedure, the patient's blood pressure, pulse, and                            oxygen saturations were monitored continuously. The                            EC-[id number] ([id number]) scope was introduced through                            the anus and advanced to the the terminal ileum,                            with identification of the appendiceal orifice and                            IC valve. The colonoscopy was performed without                            difficulty. The patient tolerated the procedure                            well. The quality of the bowel preparation was                            good. The terminal ileum, ileocecal valve,                            appendiceal orifice, and rectum were photographed. Scope In: Scope Out: Findings:      The perianal and digital rectal examinations were normal.      Non-bleeding internal hemorrhoids were found during retroflexion. The       hemorrhoids were medium-sized.      The exam was otherwise without abnormality. Impression:               - Non-bleeding internal hemorrhoids.                           -  The examination was otherwise normal.                           - No specimens collected. Moderate Sedation:      N/A Recommendation:           - Resume previous diet.                           - Continue present medications.                           - Resume heparin at prior dose today. Refer to                            managing physician for further adjustment of                            therapy.                           - Repeat colonoscopy in 10 years for screening                            purposes.                           -Will sign off, please call with any questions Procedure Code(s):        --- Professional ---                           O9629, Colorectal cancer screening; colonoscopy on                            individual not meeting criteria for  high risk Diagnosis Code(s):        --- Professional ---                           Z12.11, Encounter for screening for malignant                            neoplasm of colon                           K64.8, Other hemorrhoids CPT copyright 2016 [employer]. All rights reserved. The codes documented in this report are preliminary and upon coder review may  be revised to meet current compliance requirements. Napoleon Form, MD 11/17/2015 12:13:41 PM This report has been signed electronically. Number of Addenda: 0

## 2015-11-17 NOTE — Progress Notes (Signed)
Pt CVP decreased from 9 at 2000 to 3 at 0000. Pt currently taking bowel prep for colonoscopy in the morning. HF paged at 0055 and cardiology paged at 0124 to inform. No response. Will continue to monitor pt status.

## 2015-11-17 NOTE — Progress Notes (Signed)
Inpatient Diabetes Program Recommendations  AACE/ADA: New Consensus Statement on Inpatient Glycemic Control (2015)  Target Ranges:  Prepandial:   less than 140 mg/dL      Peak postprandial:   less than 180 mg/dL (1-2 hours)      Critically ill patients:  140 - 180 mg/dL  Results for SAMAY, BURRITT (MRN [id number]) as of 11/17/2015 11:05  Ref. Range 11/17/2015 04:00  Glucose Latest Ref Range: 65-99 mg/dL 119 (H)   Review of Glycemic Control  Diabetes history: NO Outpatient Diabetes medications: NA Current orders for Inpatient glycemic control: none  Inpatient Diabetes Program Recommendations: Correction (SSI): Noted lab glucose of 259 mg/dl this morning at 4am. However, all other lab glucose values since being admitted  have ranged from 96-182 mg/dl. May want to consider ordering CBGs with Novolog sensitive correction if needed. HgbA1C: A1C 6.3% on 11/09/2015.  Thanks, Orlando Penner, RN, MSN, CDE Diabetes Coordinator Inpatient Diabetes Program [phone] (Team Pager from 8am to 5pm) [phone] ([facility] office) [phone] [facility] office) [phone] [facility] office)

## 2015-11-17 NOTE — Anesthesia Procedure Notes (Signed)
Procedure Name: MAC Date/Time: 11/17/2015 11:15 AM Performed by: Lovie Chol Pre-anesthesia Checklist: Patient identified, Emergency Drugs available, Suction available, Patient being monitored and Timeout performed Patient Re-evaluated:Patient Re-evaluated prior to inductionOxygen Delivery Method: Nasal cannula Preoxygenation: Pre-oxygenation with 100% oxygen

## 2015-11-17 NOTE — Progress Notes (Signed)
ANTICOAGULATION CONSULT NOTE - Follow Up Consult  Pharmacy Consult for Heparin Indication: LV thrombus  No Known Allergies  Patient Measurements: Height: 5\' 11"  (180.3 cm) Weight: 159 lb 13.3 oz (72.5 kg) IBW/kg (Calculated) : 75.3  Vital Signs: Temp: 97.8 F (36.6 C) (05/04 1228) Temp Source: Axillary (05/04 1228) BP: 83/70 mmHg (05/04 1330) Pulse Rate: 74 (05/04 1330)  Labs:  Recent Labs  11/15/15 0532 11/16/15 0335 11/16/15 1223 11/17/15 0400  HGB 11.8* 11.3*  --   --   HCT 37.2* 34.9*  --   --   PLT 314 303  --   --   LABPROT 19.7* 17.2*  --  16.1*  INR 1.67* 1.40  --  1.27  HEPARINUNFRC 0.48 0.79* 0.77*  --   CREATININE 1.02 1.04  --  0.93    Estimated Creatinine Clearance: 88.8 mL/min (by C-G formula based on Cr of 0.93).  Assessment: 60yom on coumadin pta for hx LV thrombus, transitioned to IV heparin on admission pending cath. S/P cath 4/24, found to have NICM, coumadin resumed with heparin bridge. On 4/30, INR was 1.98 and heparin was stopped due to drop in H/H - repeat H/H was better. Patient is now anticipating LVAD this admission so coumadin held and heparin bridge restarted 5/1.  He underwent colonoscopy and EGD today. Colonoscopy showed non-bleeding internal hemorrhoids and EGD was unremarkable. OK to resume heparin today per GI.  Goal of Therapy:  Heparin level 0.3-0.7 units/ml Monitor platelets by anticoagulation protocol: Yes   Plan:  1) Resume heparin at 1100 units/hr 2) Check 6 hour heparin level 3) Daily heparin level and CBC  Fredrik Rigger 11/17/2015,2:38 PM

## 2015-11-17 NOTE — Op Note (Signed)
[facility] Patient Name: Matthew Vaughan Procedure Date : 11/17/2015 MRN: [id number] Attending MD: Napoleon Form , MD Date of Birth: 09-17-1955 CSN: [id number] Age: 60 Admit Type: Inpatient Procedure:                Upper GI endoscopy Indications:              Iron deficiency anemia, Pre-op (LVAD placement) Providers:                Napoleon Form, MD, Sarah Monday, RN, Romie Minus, CRNA, Beryle Beams, Technician Referring MD:              Medicines:                Monitored Anesthesia Care Complications:            No immediate complications. Estimated Blood Loss:     Estimated blood loss: none. Procedure:                Pre-Anesthesia Assessment:                           - Prior to the procedure, a History and Physical                            was performed, and patient medications and                            allergies were reviewed. The patient's tolerance of                            previous anesthesia was also reviewed. The risks                            and benefits of the procedure and the sedation                            options and risks were discussed with the patient.                            All questions were answered, and informed consent                            was obtained. Prior Anticoagulants: The patient                            last took heparin on the day of the procedure. ASA                            Grade Assessment: IV - A patient with severe                            systemic disease that is a constant threat to life.  After reviewing the risks and benefits, the patient                            was deemed in satisfactory condition to undergo the                            procedure.                           After obtaining informed consent, the endoscope was                            passed under direct vision. Throughout the   procedure, the patient's blood pressure, pulse, and                            oxygen saturations were monitored continuously. The                            [id number] ([id number]) scope was introduced through the                            mouth, and advanced to the second part of duodenum.                            The upper GI endoscopy was accomplished without                            difficulty. The patient tolerated the procedure                            well. Scope In: Scope Out: Findings:      The esophagus was normal.      The stomach was normal.      The examined duodenum was normal. Impression:               - Normal esophagus.                           - Normal stomach.                           - Normal examined duodenum.                           - No specimens collected. Moderate Sedation:      N/A Recommendation:           - Resume previous diet.                           - Continue present medications.                           - Resume heparin at prior dose today. Refer to                            managing  physician for further adjustment of                            therapy.                           - No repeat upper endoscopy. Procedure Code(s):        --- Professional ---                           [phone], Esophagogastroduodenoscopy, flexible,                            transoral; diagnostic, including collection of                            specimen(s) by brushing or washing, when performed                            (separate procedure) Diagnosis Code(s):        --- Professional ---                           D50.9, Iron deficiency anemia, unspecified CPT copyright 2016 [employer] Association. All rights reserved. The codes documented in this report are preliminary and upon coder review may  be revised to meet current compliance requirements. Napoleon Form, MD 11/17/2015 12:11:11 PM This report has been signed electronically. Number of  Addenda: 0

## 2015-11-17 NOTE — Progress Notes (Signed)
Levo at set 

## 2015-11-17 NOTE — Progress Notes (Signed)
Patient ID: Matthew Vaughan, male   DOB: 1955-08-06, 60 y.o.   MRN: [id number]   SUBJECTIVE:  Failed Milrinone wean. Remains on milrinone 0.25 mcg.  Lisinopril stopped with low BP and low dose norepinephrine at 3 was added.  SBP now in 100s.  Feels good, no dyspnea.  CVP 8 today.  Co-ox inaccurate.  Going for EGD/c-scope.   LHC/RHC (11/07/15) Left Main  No significant coronary disease.      Left Anterior Descending  No significant coronary disease.     Ramus Intermedius  Moderate vessel, no significant coronary disease.     Left Circumflex  No significant coronary disease.     Right Coronary Artery  No significant coronary disease.      Wall Motion    Not done, possible LV thrombus.   RHC Procedural Findings (mmHg): Hemodynamics RA mean 22 RV 56/25 PA 57/34, mean 25 PCWP mean 35 AO 83/68  Oxygen saturations: PA 36% AO 100%  Cardiac Output (Fick) 2.72  Cardiac Index (Fick) 1.39  PVR 2.6 WU       Cardiac MRI (4/25): Severe LV dilation with EF 8%, small thrombus on mid anterior wall, mildly dilated RV with moderately decreased systolic function.  Patchy LGE pattern in inferior wall, ?prior myocarditis.   Scheduled Meds: . digoxin  0.125 mg Oral Daily  . feeding supplement (ENSURE ENLIVE)  237 mL Oral BID BM  . sodium chloride flush  10-40 mL Intracatheter Q12H  . sodium chloride flush  3 mL Intravenous Q12H  . sodium chloride flush  3 mL Intravenous Q12H  . spironolactone  12.5 mg Oral Daily   Continuous Infusions: . milrinone 0.25 mcg/kg/min (11/16/15 2000)  . norepinephrine (LEVOPHED) Adult infusion 3 mcg/min (11/16/15 2000)   PRN Meds:.sodium chloride, sodium chloride, acetaminophen, metoCLOPramide (REGLAN) injection, nitroGLYCERIN, ondansetron (ZOFRAN) IV, sodium chloride flush, sodium chloride flush, sodium chloride flush   Filed Vitals:   11/17/15 0500 11/17/15 0550 11/17/15 0600 11/17/15 0700  BP:      Pulse: 85  98 99  Temp:      TempSrc:        Resp: 11  21 18   Height:      Weight:  159 lb 13.3 oz (72.5 kg)    SpO2: 100%  99% 100%    Intake/Output Summary (Last 24 hours) at 11/17/15 0801 Last data filed at 11/17/15 0700  Gross per 24 hour  Intake 915.85 ml  Output   1001 ml  Net -85.15 ml    LABS: Basic Metabolic Panel:  Recent Labs  01/13/15 0335 11/17/15 0400  NA 136 130*  K 4.7 5.2*  CL 104 101  CO2 25 21*  GLUCOSE 112* 259*  BUN 16 12  CREATININE 1.04 0.93  CALCIUM 9.2 9.2   Liver Function Tests:  Recent Labs  11/16/15 0335  AST 58*  ALT 70*  ALKPHOS 98  BILITOT 1.0  PROT 7.5  ALBUMIN 2.8*   No results for input(s): LIPASE, AMYLASE in the last 72 hours. CBC:  Recent Labs  11/15/15 0532 11/16/15 0335  WBC 4.4 4.2  HGB 11.8* 11.3*  HCT 37.2* 34.9*  MCV 102.8* 102.3*  PLT 314 303   Cardiac Enzymes: No results for input(s): CKTOTAL, CKMB, CKMBINDEX, TROPONINI in the last 72 hours. BNP: Invalid input(s): POCBNP D-Dimer: No results for input(s): DDIMER in the last 72 hours. Hemoglobin A1C: No results for input(s): HGBA1C in the last 72 hours. Fasting Lipid Panel: No results for input(s): CHOL, HDL, LDLCALC, TRIG,  CHOLHDL, LDLDIRECT in the last 72 hours. Thyroid Function Tests: No results for input(s): TSH, T4TOTAL, T3FREE, THYROIDAB in the last 72 hours.  Invalid input(s): FREET3 Anemia Panel: No results for input(s): VITAMINB12, FOLATE, FERRITIN, TIBC, IRON, RETICCTPCT in the last 72 hours.  RADIOLOGY: Ct Abdomen Pelvis Wo Contrast  11/09/2015  CLINICAL DATA:  Preoperative evaluation, prior to surgical treatment of nonischemic cardiomyopathy and left ventricular thrombus. Initial encounter. EXAM: CT CHEST, ABDOMEN AND PELVIS WITHOUT CONTRAST TECHNIQUE: Multidetector CT imaging of the chest, abdomen and pelvis was performed following the standard protocol without IV contrast. COMPARISON:  None. FINDINGS: CT CHEST Patchy peripheral opacities within the right upper and middle lobes  likely reflect scarring, though mild infection could have a similar appearance. The lungs are otherwise grossly clear. No pleural effusion or pneumothorax is seen. No masses are identified. Minimal blebs are noted at the lung apices. The heart is mildly enlarged, with left ventricular enlargement. A right PICC is noted ending about the proximal SVC. No pericardial effusion is identified. The great vessels are grossly unremarkable in appearance. No mediastinal lymphadenopathy is seen. The visualized portions of thyroid gland are unremarkable. No axillary lymphadenopathy is seen. No acute osseous abnormalities are identified. CT ABDOMEN AND PELVIS The liver and spleen are unremarkable in appearance. The gallbladder is within normal limits. The pancreas and adrenal glands are unremarkable. The kidneys are unremarkable in appearance. There is no evidence of hydronephrosis. No renal or ureteral stones are seen. No perinephric stranding is appreciated. No free fluid is identified. The small bowel is unremarkable in appearance. The stomach is within normal limits. No acute vascular abnormalities are seen. The appendix is normal in caliber, without evidence of appendicitis. The colon is grossly unremarkable in appearance. The bladder is mildly distended and grossly remarkable. The prostate remains normal in size. No inguinal lymphadenopathy is seen. No acute osseous abnormalities are identified. IMPRESSION: 1. Patchy peripheral opacities within the right upper and middle lobes likely reflect scarring, though mild infection could have a similar appearance. 2. Mild cardiomegaly, with left ventricular enlargement. 3. No acute abnormality seen within the abdomen or pelvis. Electronically Signed   By: Roanna Raider M.D.   On: 11/09/2015 20:00   Dg Chest 2 View  11/04/2015  CLINICAL DATA:  Chest pain and shortness of breath for 1 day EXAM: CHEST  2 VIEW COMPARISON:  None. FINDINGS: Cardiac enlargement without vascular  congestion. Focal area of nodular consolidation in the right upper lung probably represents focal pneumonia. Soft tissue lesion is not excluded. Followup PA and lateral chest X-ray is recommended in 3-4 weeks following trial of antibiotic therapy to ensure resolution and exclude underlying malignancy. Left lung is clear. No blunting of costophrenic angles. No pneumothorax. Mediastinal contours appear intact. Degenerative changes in the spine and shoulders. IMPRESSION: Cardiac enlargement. Focal nodular consolidation at the right upper lung likely represent pneumonia. Followup PA and lateral chest X-ray is recommended in 3-4 weeks following trial of antibiotic therapy to ensure resolution and exclude underlying malignancy. Electronically Signed   By: Burman Nieves M.D.   On: 11/04/2015 23:00   Ct Chest Wo Contrast  11/09/2015  CLINICAL DATA:  Preoperative evaluation, prior to surgical treatment of nonischemic cardiomyopathy and left ventricular thrombus. Initial encounter. EXAM: CT CHEST, ABDOMEN AND PELVIS WITHOUT CONTRAST TECHNIQUE: Multidetector CT imaging of the chest, abdomen and pelvis was performed following the standard protocol without IV contrast. COMPARISON:  None. FINDINGS: CT CHEST Patchy peripheral opacities within the right upper and middle lobes likely  reflect scarring, though mild infection could have a similar appearance. The lungs are otherwise grossly clear. No pleural effusion or pneumothorax is seen. No masses are identified. Minimal blebs are noted at the lung apices. The heart is mildly enlarged, with left ventricular enlargement. A right PICC is noted ending about the proximal SVC. No pericardial effusion is identified. The great vessels are grossly unremarkable in appearance. No mediastinal lymphadenopathy is seen. The visualized portions of thyroid gland are unremarkable. No axillary lymphadenopathy is seen. No acute osseous abnormalities are identified. CT ABDOMEN AND PELVIS The  liver and spleen are unremarkable in appearance. The gallbladder is within normal limits. The pancreas and adrenal glands are unremarkable. The kidneys are unremarkable in appearance. There is no evidence of hydronephrosis. No renal or ureteral stones are seen. No perinephric stranding is appreciated. No free fluid is identified. The small bowel is unremarkable in appearance. The stomach is within normal limits. No acute vascular abnormalities are seen. The appendix is normal in caliber, without evidence of appendicitis. The colon is grossly unremarkable in appearance. The bladder is mildly distended and grossly remarkable. The prostate remains normal in size. No inguinal lymphadenopathy is seen. No acute osseous abnormalities are identified. IMPRESSION: 1. Patchy peripheral opacities within the right upper and middle lobes likely reflect scarring, though mild infection could have a similar appearance. 2. Mild cardiomegaly, with left ventricular enlargement. 3. No acute abnormality seen within the abdomen or pelvis. Electronically Signed   By: Roanna Raider M.D.   On: 11/09/2015 20:00   Mr Card Morphology Wo/w Cm  11/09/2015  CLINICAL DATA:  Nonischemic cardiomyopathy EXAM: CARDIAC MRI TECHNIQUE: The patient was scanned on a 1.5 Tesla GE magnet. A dedicated cardiac coil was used. Functional imaging was done using Fiesta sequences. 2,3, and 4 chamber views were done to assess for RWMA's. Modified Simpson's rule using a short axis stack was used to calculate an ejection fraction on a dedicated work [employer]. The patient received 25 cc of Multihance. After 10 minutes inversion recovery sequences were used to assess for infiltration and scar tissue. CONTRAST:  25 cc Multihance FINDINGS: Limited images of the lung fields showed no significant abnormalities. Markedly dilated left ventricle with relatively thin walls. Diffuse severe hypokinesis, EF 8%. There was a small LV thrombus adherent to the  mid anterior wall. The right ventricle was mildly dilated with moderately decreased systolic function. There was mild to moderate central mitral regurgitation. Trileaflet aortic valve with no significant stenosis or regurgitation. Moderate left atrial enlargement. Mild right atrial enlargement. On delayed enhancement imaging, there was patchy late gadolinium enhancement (LGE) in the inferior wall. There was a small area of full thickness LGE in the basal inferior wall. There was a small area of near-full thickness LGE in the mid inferior wall. Finally, there were a couple of small areas of < 25% thickness subendocardial LGE in the apical inferior wall. MEASUREMENTS: LVEDV 533 mL LV SV 42 mL LV EF 8% IMPRESSION: 1. Markedly dilated left ventricle with very severe diffuse hypokinesis, EF 8%. 2.  Mildly dilated RV with moderately decreased systolic function. 3.  Small LV mid anterior wall thrombus. 4. Patchy inferior LGE pattern as described above. This is of uncertain significance, could be related to prior myocarditis. Layza Summa Electronically Signed   By: [name] M.D.   On: 11/09/2015 00:43    PHYSICAL EXAM CVP ~8  General: NAD. Sitting in chair.  Neck: JVP flat, no thyromegaly or thyroid nodule.  Lungs:  Clear to auscultation bilaterally with normal respiratory effort. CV: Lateral PMI.  Heart regular S1/S2, loud s3 no murmur.  No edema.   Abdomen: Soft, nontender, no hepatosplenomegaly, no distention.  Neurologic: Alert and oriented x 3.  Psych: Normal affect. Extremities: No clubbing or cyanosis.   TELEMETRY: Reviewed telemetry pt in NSR 100s  ASSESSMENT AND PLAN: 60 yo with history of nonischemic cardiomyopathy and apparently a prior LV thrombus presented with acute/chronic systolic CHF. He is followed by a cardiologist in [place].  1. Acute/chronic systolic CHF -> cardiogenic shock:  Nonischemic cardiomyopathy.  Cause uncertain => not a heavy drinker, had one sister with what sounds  like sudden death.  Possible viral myocarditis. EF has been very low per his report, do not have echo done yet here.  Had chest pain and elevated TnI at admission, so had LHC/RHC 4/24.  No coronary disease noted, no evidence for cardioembolism from LV thrombus either.  RHC showed markedly elevated filling pressures and low cardiac index at 1.39.  He was started on milrinone and IV Lasix.  Cardiac MRI was done, showing severe LV dilation with EF 8%, small LV thrombus, and mildly dilated RV with moderately decreased systolic function.  There was a patchy LGE pattern in the inferior wall, ?prior myocarditis.  HIV negative.  Milrinone restarted 4/28 now at 0.25. Norepinephrine begun at low dose for soft BP.  - CVP 8, can hold torsemide today but may need to restart soon. - Continue current milrinone and norepinephrine.   - Continue current digoxin. Dig level 0.3   - Continue spironolactone 12.5 daily.     - Off lisinopril now with soft BP.   - M-spike noted on SPEP.  Serum immunofixation negative. Does not look like cardiac amyloidosis on MRI.  - He has a LBBB and no ICD (pending Medicaid).  - Mr Clementson is milrinone-dependent.  He has no device.  He has Medicaid pending.  We are proceeding with LVAD workup and he has been seen by surgeon. Keep in house until VAD => plan for next Monday or Tuesday.  He is having GI evaluation with scopes today, will need repeat RHC tomorrow.   2. LV thrombus: Small thrombus on MRI. Off  warfarin and cover with heparin gtt. 3. Anemia: unclear source. Today's hemoglobin is stable. Continue Protonix  [name]  11/17/2015 8:01 AM

## 2015-11-17 NOTE — Progress Notes (Signed)
CSW met with patient at bedside. Patient states surgery planned for Monday. Patient shared he has never had any "surgery or broken bones or anything" before until now. Patient states he is mentally ready for the surgery but shared concerns about the intubation. CSW and patient spoke about coping mechanisms and ways to help get through the first few days of surgery. Patient's mother will return from [place] this weekend and be available for patient as long as needed. Patient appears to be coping well and appropriate for planned surgery on Monday.  CSW provided supportive intervention and will continue to follow throughout implant hospitalization. Raquel Sarna, LCSW [phone]

## 2015-11-17 NOTE — Transfer of Care (Signed)
Immediate Anesthesia Transfer of Care Note  Patient: Matthew Vaughan  Procedure(s) Performed: Procedure(s): COLONOSCOPY (N/A) ESOPHAGOGASTRODUODENOSCOPY (EGD) (N/A)  Patient Location: PACU  Anesthesia Type:MAC  Level of Consciousness: sedated and patient cooperative  Airway & Oxygen Therapy: Patient Spontanous Breathing and Patient connected to nasal cannula oxygen  Post-op Assessment: Report given to RN and Post -op Vital signs reviewed and stable  Post vital signs: Reviewed  Last Vitals:  Filed Vitals:   11/17/15 0840 11/17/15 0906  BP: 90/69 91/63  Pulse: 96   Temp: 36.8 C 36.7 C  Resp: 21 20    Last Pain:  Filed Vitals:   11/17/15 1039  PainSc: 0-No pain      Patients Stated Pain Goal: 0 (11/09/15 0333)  Complications: No apparent anesthesia complications

## 2015-11-18 ENCOUNTER — Encounter (HOSPITAL_COMMUNITY)
Admission: EM | Disposition: A | Discharge status: Home Health | Payer: Self-pay | Source: Home / Self Care | Attending: Cardiovascular Disease

## 2015-11-18 ENCOUNTER — Encounter (HOSPITAL_COMMUNITY): Payer: Self-pay | Admitting: Cardiology

## 2015-11-18 DIAGNOSIS — I509 Heart failure, unspecified: Secondary | ICD-10-CM

## 2015-11-18 DIAGNOSIS — D649 Anemia, unspecified: Secondary | ICD-10-CM

## 2015-11-18 HISTORY — PX: CARDIAC CATHETERIZATION: SHX172

## 2015-11-18 LAB — POCT I-STAT 3, VENOUS BLOOD GAS (G3P V)
ACID-BASE DEFICIT: 1 mmol/L (ref 0.0–2.0)
ACID-BASE DEFICIT: 1 mmol/L (ref 0.0–2.0)
BICARBONATE: 23.9 meq/L (ref 20.0–24.0)
BICARBONATE: 24.6 meq/L — AB (ref 20.0–24.0)
O2 SAT: 65 %
O2 Saturation: 65 %
PCO2 VEN: 41.8 mmHg — AB (ref 45.0–50.0)
PH VEN: 7.363 — AB (ref 7.250–7.300)
PO2 VEN: 35 mmHg (ref 31.0–45.0)
PO2 VEN: 35 mmHg (ref 31.0–45.0)
TCO2: 25 mmol/L (ref 0–100)
TCO2: 26 mmol/L (ref 0–100)
pCO2, Ven: 43.3 mmHg — ABNORMAL LOW (ref 45.0–50.0)
pH, Ven: 7.365 — ABNORMAL HIGH (ref 7.250–7.300)

## 2015-11-18 LAB — CARBOXYHEMOGLOBIN
CARBOXYHEMOGLOBIN: 1.5 % (ref 0.5–1.5)
METHEMOGLOBIN: 0.7 % (ref 0.0–1.5)
O2 Saturation: 55.1 %
TOTAL HEMOGLOBIN: 11.8 g/dL — AB (ref 13.5–18.0)

## 2015-11-18 LAB — BASIC METABOLIC PANEL
Anion gap: 8 (ref 5–15)
BUN: 17 mg/dL (ref 6–20)
CO2: 22 mmol/L (ref 22–32)
Calcium: 9 mg/dL (ref 8.9–10.3)
Chloride: 102 mmol/L (ref 101–111)
Creatinine, Ser: 1 mg/dL (ref 0.61–1.24)
GFR calc Af Amer: >60 mL/min (ref >60)
GLUCOSE: 176 mg/dL — AB (ref 65–99)
POTASSIUM: 4.6 mmol/L (ref 3.5–5.1)
Sodium: 132 mmol/L — ABNORMAL LOW (ref 135–145)

## 2015-11-18 LAB — CBC
HCT: 33.7 % — ABNORMAL LOW (ref 39.0–52.0)
Hemoglobin: 10.8 g/dL — ABNORMAL LOW (ref 13.0–17.0)
MCH: 33.8 pg (ref 26.0–34.0)
MCHC: 32 g/dL (ref 30.0–36.0)
MCV: 105.3 fL — AB (ref 78.0–100.0)
PLATELETS: 307 10*3/uL (ref 150–400)
RBC: 3.2 MIL/uL — AB (ref 4.22–5.81)
RDW: 13.7 % (ref 11.5–15.5)
WBC: 4.7 10*3/uL (ref 4.0–10.5)

## 2015-11-18 LAB — HEPARIN LEVEL (UNFRACTIONATED)
HEPARIN UNFRACTIONATED: 0.32 [IU]/mL (ref 0.30–0.70)
Heparin Unfractionated: 0.21 IU/mL — ABNORMAL LOW (ref 0.30–0.70)

## 2015-11-18 LAB — FOLATE RBC
FOLATE, HEMOLYSATE: 581.2 ng/mL
Folate, RBC: 1637 ng/mL (ref >498)
HEMATOCRIT: 35.5 % — AB (ref 37.5–51.0)

## 2015-11-18 LAB — PROTIME-INR
INR: 1.21 (ref 0.00–1.49)
Prothrombin Time: 15.4 seconds — ABNORMAL HIGH (ref 11.6–15.2)

## 2015-11-18 SURGERY — RIGHT HEART CATH
Anesthesia: LOCAL

## 2015-11-18 MED ORDER — SODIUM CHLORIDE 0.9% FLUSH
3.0000 mL | Freq: Two times a day (BID) | INTRAVENOUS | Status: DC
  Administered 2015-11-19 – 2015-11-20 (×2): 3 mL via INTRAVENOUS

## 2015-11-18 MED ORDER — FENTANYL CITRATE (PF) 100 MCG/2ML IJ SOLN
INTRAMUSCULAR | Status: AC
  Filled 2015-11-18: qty 2

## 2015-11-18 MED ORDER — ACETAMINOPHEN 325 MG PO TABS: 650.0000 mg | ORAL_TABLET | ORAL | Status: DC | PRN

## 2015-11-18 MED ORDER — HEPARIN (PORCINE) IN NACL 2-0.9 UNIT/ML-% IJ SOLN
INTRAMUSCULAR | Status: DC | PRN
  Administered 2015-11-18: 500 mL

## 2015-11-18 MED ORDER — LIDOCAINE HCL (PF) 1 % IJ SOLN
INTRAMUSCULAR | Status: DC | PRN
  Administered 2015-11-18: 5 mL

## 2015-11-18 MED ORDER — MIDAZOLAM HCL 2 MG/2ML IJ SOLN
INTRAMUSCULAR | Status: AC
  Filled 2015-11-18: qty 2

## 2015-11-18 MED ORDER — HEPARIN (PORCINE) IN NACL 100-0.45 UNIT/ML-% IJ SOLN
1450.0000 [IU]/h | INTRAMUSCULAR | Status: DC
  Administered 2015-11-18: 1400 [IU]/h via INTRAVENOUS
  Administered 2015-11-20: 1450 [IU]/h via INTRAVENOUS
  Filled 2015-11-18 (×4): qty 250

## 2015-11-18 MED ORDER — LIDOCAINE HCL (PF) 1 % IJ SOLN
INTRAMUSCULAR | Status: AC
  Filled 2015-11-18: qty 30

## 2015-11-18 MED ORDER — SODIUM CHLORIDE 0.9% FLUSH: 3.0000 mL | Freq: Two times a day (BID) | INTRAVENOUS | Status: DC

## 2015-11-18 MED ORDER — HEPARIN (PORCINE) IN NACL 2-0.9 UNIT/ML-% IJ SOLN
INTRAMUSCULAR | Status: AC
  Filled 2015-11-18: qty 500

## 2015-11-18 MED ORDER — SODIUM CHLORIDE 0.9% FLUSH: 3.0000 mL | INTRAVENOUS | Status: DC | PRN

## 2015-11-18 MED ORDER — FENTANYL CITRATE (PF) 100 MCG/2ML IJ SOLN
INTRAMUSCULAR | Status: DC | PRN
  Administered 2015-11-18: 25 ug via INTRAVENOUS

## 2015-11-18 MED ORDER — ONDANSETRON HCL 4 MG/2ML IJ SOLN: 4.0000 mg | Freq: Four times a day (QID) | INTRAMUSCULAR | Status: DC | PRN

## 2015-11-18 MED ORDER — SODIUM CHLORIDE 0.9 % IV SOLN: 250.0000 mL | INTRAVENOUS | Status: DC | PRN

## 2015-11-18 MED ORDER — MIDAZOLAM HCL 2 MG/2ML IJ SOLN
INTRAMUSCULAR | Status: DC | PRN
  Administered 2015-11-18: 1 mg via INTRAVENOUS

## 2015-11-18 SURGICAL SUPPLY — 9 items
CATH BALLN WEDGE 5F 110CM (CATHETERS) ×2 IMPLANT
GUIDEWIRE .025 260CM (WIRE) ×2 IMPLANT
PACK CARDIAC CATHETERIZATION (CUSTOM PROCEDURE TRAY) ×2 IMPLANT
PROTECTION STATION PRESSURIZED (MISCELLANEOUS) ×2
SHEATH FAST CATH BRACH 5F 5CM (SHEATH) ×2 IMPLANT
STATION PROTECTION PRESSURIZED (MISCELLANEOUS) ×1 IMPLANT
TRANSDUCER W/STOPCOCK (MISCELLANEOUS) ×2 IMPLANT
TUBING ART PRESS 72  MALE/FEM (TUBING) ×1
TUBING ART PRESS 72 MALE/FEM (TUBING) ×1 IMPLANT

## 2015-11-18 NOTE — Progress Notes (Signed)
Matthew Vaughan has been discussed with the VAD Medical Review board on 11/14/15. The team feels as if the patient is a good candidate for Destination LVAD therapy. The patient meets criteria for a LVAD implant as listed below:  1)  Has failed to respond to optimal medical management (including beta-blockers and ACE inhibitors if tolerated) for at least 45 of the last 60 days, or have been balloon dependent for 7 days, or IV inotrope dependent for 14 days; and,       *On Inotropes Milrinone started 11/07/15.  2)  Has a left ventricular ejection fraction (LVEF) < 25% and, have demonstrated functional limitation with a peak oxygen consumption of <14 ml/kg/min unless balloon pump or inotrope dependent or physically unable to perform the test.         *EF 10 - 15%  By echo on 11/09/15.             *CPX - unable to complete d/t milrinone  3)  Social work and palliative care evaluations demonstrate appropriate support system in place for discharge to home with a VAD and that end of life discussions have taken place. Both services have expressed no concern regarding patient's candidacy.         *Social work consult (date): 11/09/15 Lasandra Beech, LSW        *Palliative Care Consult (date): 11/09/15 Lorinda Creed, NP  4)  Primary caretaker identified that can be taught along with the patient how to manage        the VAD equipment.        *Name: Tawanna Sat (mother)  5)  Deemed appropriate by our financial coordinator: Forestine Na        Prior approval: Medicaid pending; hospital prior approval 11/15/15  6)  VAD Coordinator, Hessie Diener, RN has met with patient and caregiver, shown them the VAD equipment and discussed with the patient and caregiver about lifestyle changes necessary for success on mechanical circulatory device.        *Met with patient and mother on 11/08/15        *Consent for VAD Evaluation/Caregiver Agreement/HIPPA Release/Photo Release signed on 11/08/15  7)  Patient has been  discussed  with transplant center: Dr. Shaune Pollack at [facility] and felt to be an appropriate candidate.    8)  Intermacs profile: 2  INTERMACS 1: Critical cardiogenic shock describes a patient who is "crashing and burning", in which a patient has life-threatening hypotension and rapidly escalating inotropic pressor support, with critical organ hypoperfusion often confirmed by worsening acidosis and lactate levels.  INTERMACS 2: Progressive decline describes a patient who has been demonstrated "dependent" on inotropic support but nonetheless shows signs of continuing deterioration in nutrition, renal function, fluid retention, or other major status indicator. Patient profile 2 can also describe a patient with refractory volume overload, perhaps with evidence of impaired perfusion, in whom inotropic infusions cannot be maintained due to tachyarrhythmias, clinical ischemia, or other intolerance.  INTERMACS 3: Stable but inotrope dependent describes a patient who is clinically stable on mild-moderate doses of intravenous inotropes (or has a temporary circulatory support device) after repeated documentation of failure to wean without symptomatic hypotension, worsening symptoms, or progressive organ dysfunction (usually renal). It is critical to monitor nutrition, renal function, fluid balance, and overall status carefully in order to distinguish between a  patient who is truly stable at Patient Profile 3 and a patient who has unappreciated decline rendering them Patient Profile 2. This patient may be either at home  or in the hospital.   INTERMACS 4: Resting symptoms describes a patient who is at home on oral therapy but frequently has symptoms of congestion at rest or with activities of daily living (ADL). He or she may have orthopnea, shortness of breath during ADL such as dressing or bathing, gastrointestinal symptoms (abdominal discomfort, nausea, poor appetite), disabling ascites or severe lower extremity  edema. This patient should be carefully considered for more intensive management and surveillance programs, which may in some cases, reveal poor compliance that would compromise outcomes with any therapy.  .  INTERMACS 5: Exertion Intolerant describes a patient who is comfortable at rest but unable to engage in any activity, living predominantly within the house or housebound. This patient has no congestive symptoms, but may have chronically elevated volume status, frequently with renal dysfunction, and may be characterized as exercise intolerant.   INTERMACS 6: Exertion Limited also describes a patient who is comfortable at rest without evidence of fluid overload, but who is able to do some mild activity. Activities of daily living are comfortable and minor activities outside the home such as visiting friends or going to a restaurant can be performed, but fatigue results within a few minutes of any meaningful physical exertion. This patient has occasional episodes of worsening symptoms and is likely to have had a hospitalization for heart failure within the past year.   INTERMACS 7: Advanced NYHA Class 3 describes a patient who is clinically stable with a reasonable level of comfortable activity, despite history of previous decompensation that is not recent. This patient is usually able to walk more than a block. Any decompensation requiring intravenous diuretics or hospitalization within the previous month should make this person a Patient Profile 6 or lower.    9)  NYHA Class: IV

## 2015-11-18 NOTE — Progress Notes (Signed)
Attempt to cycle BP q 15. Pt alert and oriented x4. Removed BP cuff twice and was educated. Stated that there was nothing wrong with his BP. That he would know if something was wrong. Pt stated he does not want BP to run automatically.

## 2015-11-18 NOTE — Progress Notes (Signed)
ANTICOAGULATION CONSULT NOTE   Pharmacy Consult for Heparin Indication: LV thrombus  No Known Allergies  Patient Measurements: Height: 5\' 11"  (180.3 cm) Weight: 159 lb 13.3 oz (72.5 kg) IBW/kg (Calculated) : 75.3  Vital Signs: Temp: 98.1 F (36.7 C) (05/05 0418) Temp Source: Oral (05/05 0418) BP: 87/71 mmHg (05/05 0418) Pulse Rate: 104 (05/05 0418)  Labs:  Recent Labs  11/15/15 0532 11/16/15 0335 11/16/15 1223 11/17/15 0400 11/17/15 2020 11/18/15 0300  HGB 11.8* 11.3*  --   --   --  10.8*  HCT 37.2* 34.9*  --   --   --  33.7*  PLT 314 303  --   --   --  307  LABPROT 19.7* 17.2*  --  16.1*  --  15.4*  INR 1.67* 1.40  --  1.27  --  1.21  HEPARINUNFRC 0.48 0.79* 0.77*  --  0.14* 0.21*  CREATININE 1.02 1.04  --  0.93  --   --     Estimated Creatinine Clearance: 88.8 mL/min (by C-G formula based on Cr of 0.93).  Assessment: 60yom on coumadin pta for hx LV thrombus, transitioned to IV heparin on admission pending cath. S/P cath 4/24, found to have NICM, coumadin resumed with heparin bridge. On 4/30, INR was 1.98 and heparin was stopped due to drop in H/H - repeat H/H was better. Patient is now anticipating LVAD this admission so coumadin held and heparin bridge restarted 5/1.  He underwent colonoscopy and EGD today. Colonoscopy showed non-bleeding internal hemorrhoids and EGD was unremarkable. OK to resume heparin today per GI.  Heparin level drawn this am is 0.21 after most recent heparin increase to 1300 u/h. Level was drawn around 4.5 hours after dose increase. CBC is stable with no reported bleeding.   Goal of Therapy:  Heparin level 0.3-0.7 units/ml Monitor platelets by anticoagulation protocol: Yes   Plan:  1) Slightly increase heparin to 1400 units/hr (based on previous dosing/levels) 2) Check 6 hour heparin level 3) Daily heparin level and CBC   Pollyann Samples, PharmD, BCPS 11/18/2015, 4:30 AM Pager: [phone]

## 2015-11-18 NOTE — Progress Notes (Signed)
Patient ID: Matthew Vaughan, male   DOB: 03/06/1956, 60 y.o.   MRN: [id number]   SUBJECTIVE:  Failed Milrinone wean. Remains on milrinone 0.25 mcg + Norepi 3 mcg. Off tosemide/spiro/lisonopril. EGD/Colonscopy negative.   Denies SOB. Frustrated he can't eat.   LHC/RHC (11/07/15) Left Main  No significant coronary disease.      Left Anterior Descending  No significant coronary disease.     Ramus Intermedius  Moderate vessel, no significant coronary disease.     Left Circumflex  No significant coronary disease.     Right Coronary Artery  No significant coronary disease.      Wall Motion    Not done, possible LV thrombus.   RHC Procedural Findings (mmHg): Hemodynamics RA mean 22 RV 56/25 PA 57/34, mean 25 PCWP mean 35 AO 83/68  Oxygen saturations: PA 36% AO 100%  Cardiac Output (Fick) 2.72  Cardiac Index (Fick) 1.39  PVR 2.6 WU       Cardiac MRI (4/25): Severe LV dilation with EF 8%, small thrombus on mid anterior wall, mildly dilated RV with moderately decreased systolic function.  Patchy LGE pattern in inferior wall, ?prior myocarditis.   Scheduled Meds: . digoxin  0.125 mg Oral Daily  . feeding supplement (ENSURE ENLIVE)  237 mL Oral BID BM  . sodium chloride flush  10-40 mL Intracatheter Q12H  . sodium chloride flush  3 mL Intravenous Q12H  . sodium chloride flush  3 mL Intravenous Q12H  . sodium chloride flush  3 mL Intravenous Q12H  . spironolactone  12.5 mg Oral Daily   Continuous Infusions: . sodium chloride 10 mL/hr at 11/18/15 0026  . sodium chloride Stopped (11/17/15 1300)  . heparin 1,400 Units/hr (11/18/15 0622)  . milrinone 0.25 mcg/kg/min (11/17/15 2000)  . norepinephrine (LEVOPHED) Adult infusion 3 mcg/min (11/17/15 2018)   PRN Meds:.sodium chloride, sodium chloride, sodium chloride, sodium chloride, acetaminophen, nitroGLYCERIN, ondansetron (ZOFRAN) IV, sodium chloride flush, sodium chloride flush, sodium chloride flush, sodium chloride  flush   Filed Vitals:   11/18/15 0418 11/18/15 0500 11/18/15 0600 11/18/15 0800  BP: 87/71   88/76  Pulse: 104 100 109 98  Temp: 98.1 F (36.7 C)     TempSrc: Oral     Resp: 20     Height:      Weight:  160 lb 0.9 oz (72.6 kg)    SpO2: 99% 100% 99% 100%    Intake/Output Summary (Last 24 hours) at 11/18/15 0815 Last data filed at 11/18/15 0800  Gross per 24 hour  Intake 2244.11 ml  Output   1400 ml  Net 844.11 ml    LABS: Basic Metabolic Panel:  Recent Labs  11/17/15 0400 11/18/15 0300  NA 130* 132*  K 5.2* 4.6  CL 101 102  CO2 21* 22  GLUCOSE 259* 176*  BUN 12 17  CREATININE 0.93 1.00  CALCIUM 9.2 9.0   Liver Function Tests:  Recent Labs  11/16/15 0335  AST 58*  ALT 70*  ALKPHOS 98  BILITOT 1.0  PROT 7.5  ALBUMIN 2.8*   No results for input(s): LIPASE, AMYLASE in the last 72 hours. CBC:  Recent Labs  11/16/15 0335 11/18/15 0300  WBC 4.2 4.7  HGB 11.3* 10.8*  HCT 34.9* 33.7*  MCV 102.3* 105.3*  PLT 303 307   Cardiac Enzymes: No results for input(s): CKTOTAL, CKMB, CKMBINDEX, TROPONINI in the last 72 hours. BNP: Invalid input(s): POCBNP D-Dimer: No results for input(s): DDIMER in the last 72 hours. Hemoglobin A1C: No   results for input(s): HGBA1C in the last 72 hours. Fasting Lipid Panel: No results for input(s): CHOL, HDL, LDLCALC, TRIG, CHOLHDL, LDLDIRECT in the last 72 hours. Thyroid Function Tests: No results for input(s): TSH, T4TOTAL, T3FREE, THYROIDAB in the last 72 hours.  Invalid input(s): FREET3 Anemia Panel: No results for input(s): VITAMINB12, FOLATE, FERRITIN, TIBC, IRON, RETICCTPCT in the last 72 hours.  RADIOLOGY: Ct Abdomen Pelvis Wo Contrast  11/09/2015  CLINICAL DATA:  Preoperative evaluation, prior to surgical treatment of nonischemic cardiomyopathy and left ventricular thrombus. Initial encounter. EXAM: CT CHEST, ABDOMEN AND PELVIS WITHOUT CONTRAST TECHNIQUE: Multidetector CT imaging of the chest, abdomen and pelvis  was performed following the standard protocol without IV contrast. COMPARISON:  None. FINDINGS: CT CHEST Patchy peripheral opacities within the right upper and middle lobes likely reflect scarring, though mild infection could have a similar appearance. The lungs are otherwise grossly clear. No pleural effusion or pneumothorax is seen. No masses are identified. Minimal blebs are noted at the lung apices. The heart is mildly enlarged, with left ventricular enlargement. A right PICC is noted ending about the proximal SVC. No pericardial effusion is identified. The great vessels are grossly unremarkable in appearance. No mediastinal lymphadenopathy is seen. The visualized portions of thyroid gland are unremarkable. No axillary lymphadenopathy is seen. No acute osseous abnormalities are identified. CT ABDOMEN AND PELVIS The liver and spleen are unremarkable in appearance. The gallbladder is within normal limits. The pancreas and adrenal glands are unremarkable. The kidneys are unremarkable in appearance. There is no evidence of hydronephrosis. No renal or ureteral stones are seen. No perinephric stranding is appreciated. No free fluid is identified. The small bowel is unremarkable in appearance. The stomach is within normal limits. No acute vascular abnormalities are seen. The appendix is normal in caliber, without evidence of appendicitis. The colon is grossly unremarkable in appearance. The bladder is mildly distended and grossly remarkable. The prostate remains normal in size. No inguinal lymphadenopathy is seen. No acute osseous abnormalities are identified. IMPRESSION: 1. Patchy peripheral opacities within the right upper and middle lobes likely reflect scarring, though mild infection could have a similar appearance. 2. Mild cardiomegaly, with left ventricular enlargement. 3. No acute abnormality seen within the abdomen or pelvis. Electronically Signed   By: Jeffery  Chang M.D.   On: 11/09/2015 20:00   Dg Chest 2  View  11/04/2015  CLINICAL DATA:  Chest pain and shortness of breath for 1 day EXAM: CHEST  2 VIEW COMPARISON:  None. FINDINGS: Cardiac enlargement without vascular congestion. Focal area of nodular consolidation in the right upper lung probably represents focal pneumonia. Soft tissue lesion is not excluded. Followup PA and lateral chest X-ray is recommended in 3-4 weeks following trial of antibiotic therapy to ensure resolution and exclude underlying malignancy. Left lung is clear. No blunting of costophrenic angles. No pneumothorax. Mediastinal contours appear intact. Degenerative changes in the spine and shoulders. IMPRESSION: Cardiac enlargement. Focal nodular consolidation at the right upper lung likely represent pneumonia. Followup PA and lateral chest X-ray is recommended in 3-4 weeks following trial of antibiotic therapy to ensure resolution and exclude underlying malignancy. Electronically Signed   By: William  Stevens M.D.   On: 11/04/2015 23:00   Ct Chest Wo Contrast  11/09/2015  CLINICAL DATA:  Preoperative evaluation, prior to surgical treatment of nonischemic cardiomyopathy and left ventricular thrombus. Initial encounter. EXAM: CT CHEST, ABDOMEN AND PELVIS WITHOUT CONTRAST TECHNIQUE: Multidetector CT imaging of the chest, abdomen and pelvis was performed following the standard protocol   without IV contrast. COMPARISON:  None. FINDINGS: CT CHEST Patchy peripheral opacities within the right upper and middle lobes likely reflect scarring, though mild infection could have a similar appearance. The lungs are otherwise grossly clear. No pleural effusion or pneumothorax is seen. No masses are identified. Minimal blebs are noted at the lung apices. The heart is mildly enlarged, with left ventricular enlargement. A right PICC is noted ending about the proximal SVC. No pericardial effusion is identified. The great vessels are grossly unremarkable in appearance. No mediastinal lymphadenopathy is seen. The  visualized portions of thyroid gland are unremarkable. No axillary lymphadenopathy is seen. No acute osseous abnormalities are identified. CT ABDOMEN AND PELVIS The liver and spleen are unremarkable in appearance. The gallbladder is within normal limits. The pancreas and adrenal glands are unremarkable. The kidneys are unremarkable in appearance. There is no evidence of hydronephrosis. No renal or ureteral stones are seen. No perinephric stranding is appreciated. No free fluid is identified. The small bowel is unremarkable in appearance. The stomach is within normal limits. No acute vascular abnormalities are seen. The appendix is normal in caliber, without evidence of appendicitis. The colon is grossly unremarkable in appearance. The bladder is mildly distended and grossly remarkable. The prostate remains normal in size. No inguinal lymphadenopathy is seen. No acute osseous abnormalities are identified. IMPRESSION: 1. Patchy peripheral opacities within the right upper and middle lobes likely reflect scarring, though mild infection could have a similar appearance. 2. Mild cardiomegaly, with left ventricular enlargement. 3. No acute abnormality seen within the abdomen or pelvis. Electronically Signed   By: Jeffery  Chang M.D.   On: 11/09/2015 20:00   Dg Chest Port 1 View  11/17/2015  CLINICAL DATA:  Patient with history of shortness breath. EXAM: PORTABLE CHEST 1 VIEW COMPARISON:  Chest radiograph 11/04/2015.  Chest CT 11/09/2015. FINDINGS: Right upper extremity PICC line is present with tip terminating in the superior vena cava. Multiple monitoring leads overlie the patient. Stable enlarged cardiac mediastinal contours. Slight improvement in previously described irregular consolidation right upper lobe. Remainder of the lungs are clear. No pleural effusion or pneumothorax. IMPRESSION: Suggestion of slight interval improvement right upper lobe consolidation, potentially represent pneumonia. Followup PA and lateral  chest X-ray is recommended in 3-4 weeks following trial of antibiotic therapy to ensure resolution and exclude underlying malignancy. Electronically Signed   By: Drew  Davis M.D.   On: 11/17/2015 09:15   Mr Card Morphology Wo/w Cm  11/09/2015  CLINICAL DATA:  Nonischemic cardiomyopathy EXAM: CARDIAC MRI TECHNIQUE: The patient was scanned on a 1.5 Tesla GE magnet. A dedicated cardiac coil was used. Functional imaging was done using Fiesta sequences. 2,3, and 4 chamber views were done to assess for RWMA's. Modified Simpson's rule using a short axis stack was used to calculate an ejection fraction on a dedicated work station using Circle software. The patient received 25 cc of Multihance. After 10 minutes inversion recovery sequences were used to assess for infiltration and scar tissue. CONTRAST:  25 cc Multihance FINDINGS: Limited images of the lung fields showed no significant abnormalities. Markedly dilated left ventricle with relatively thin walls. Diffuse severe hypokinesis, EF 8%. There was a small LV thrombus adherent to the mid anterior wall. The right ventricle was mildly dilated with moderately decreased systolic function. There was mild to moderate central mitral regurgitation. Trileaflet aortic valve with no significant stenosis or regurgitation. Moderate left atrial enlargement. Mild right atrial enlargement. On delayed enhancement imaging, there was patchy late gadolinium enhancement (LGE) in   the inferior wall. There was a small area of full thickness LGE in the basal inferior wall. There was a small area of near-full thickness LGE in the mid inferior wall. Finally, there were a couple of small areas of < 25% thickness subendocardial LGE in the apical inferior wall. MEASUREMENTS: LVEDV 533 mL LV SV 42 mL LV EF 8% IMPRESSION: 1. Markedly dilated left ventricle with very severe diffuse hypokinesis, EF 8%. 2.  Mildly dilated RV with moderately decreased systolic function. 3.  Small LV mid anterior wall  thrombus. 4. Patchy inferior LGE pattern as described above. This is of uncertain significance, could be related to prior myocarditis. Matthew Vaughan Electronically Signed   By: Tyrel Lex M.D.   On: 11/09/2015 00:43    PHYSICAL EXAM CVP ~3 General: NAD. Sitting in chair.  Neck: JVP flat, no thyromegaly or thyroid nodule.  Lungs: Clear to auscultation bilaterally with normal respiratory effort. CV: Lateral PMI.  Heart regular S1/S2, loud s3 no murmur.  No edema.   Abdomen: Soft, nontender, no hepatosplenomegaly, no distention.  Neurologic: Alert and oriented x 3.  Psych: Normal affect. Extremities: No clubbing or cyanosis.   TELEMETRY: Reviewed telemetry pt in NSR 100s  ASSESSMENT AND PLAN: 60 yo with history of nonischemic cardiomyopathy and apparently a prior LV thrombus presented with acute/chronic systolic CHF. He is followed by a cardiologist in [place].  1. Acute/chronic systolic CHF -> cardiogenic shock:  Nonischemic cardiomyopathy.  Cause uncertain => not a heavy drinker, had one sister with what sounds like sudden death.  Possible viral myocarditis. EF has been very low per his report, do not have echo done yet here.  Had chest pain and elevated TnI at admission, so had LHC/RHC 4/24.  No coronary disease noted, no evidence for cardioembolism from LV thrombus either.  RHC showed markedly elevated filling pressures and low cardiac index at 1.39.  He was started on milrinone and IV Lasix.  Cardiac MRI was done, showing severe LV dilation with EF 8%, small LV thrombus, and mildly dilated RV with moderately decreased systolic function.  There was a patchy LGE pattern in the inferior wall, ?prior myocarditis.  HIV negative.  Milrinone restarted 4/28 now at 0.25. Norepinephrine begun at low dose for soft BP. Today CO-OX marginal at 55% on milrinone + norepi.  - CVP 3, can hold torsemide today but may need to restart soon. - Continue current milrinone and norepinephrine, increase milrinone to  0.375 with co-ox 55%.   - Continue current digoxin. Dig level 0.3   - Spiro held yesterday. K  4.6    - RHC today  - Off lisinopril now with soft BP.   - M-spike noted on SPEP.  Serum immunofixation negative. Does not look like cardiac amyloidosis on MRI.  - He has a LBBB and no ICD (pending Medicaid).  - Mr Neukam is milrinone-dependent.  He has no device.  He has Medicaid pending.  We are proceeding with LVAD workup and he has been seen by surgeon. Keep in house until VAD => plan for next Monday or Tuesday, will need to confirm.   2. LV thrombus: Small thrombus on MRI. Off  warfarin and cover with heparin gtt. 3. Anemia: unclear source. Today's hemoglobin is stable. Continue Protonix. EGD and Colonoscopy unremarkable.   RHC later today.   Amy Clegg NP-C  11/18/2015 8:15 AM   Patient seen with NP, agree with the above note.  He is symptomatically stable though co-ox somewhat marginal.  Feels good   overall, has been walking in halls.  Planning for LVAD implant next week, workup completed (scopes ok yesterday).   - Increase milrinone to 0.375 and continue low dose norepinephrine.  - Plan RHC today to reassess filling pressures and output.   Matthew Vaughan 11/18/2015     

## 2015-11-18 NOTE — Progress Notes (Signed)
ANTICOAGULATION CONSULT NOTE - Follow Up Consult  Pharmacy Consult for Heparin Indication: LV thrombus  No Known Allergies  Patient Measurements: Height: 5\' 11"  (180.3 cm) Weight: 160 lb 0.9 oz (72.6 kg) IBW/kg (Calculated) : 75.3  Vital Signs: Temp: 98.3 F (36.8 C) (05/05 1202) Temp Source: Oral (05/05 1202) BP: 113/99 mmHg (05/05 1415) Pulse Rate: 110 (05/05 1413)  Labs:  Recent Labs  11/16/15 0335 11/16/15 1223 11/17/15 0400 11/17/15 2020 11/18/15 0300  HGB 11.3*  --   --   --  10.8*  HCT 34.9*  --   --   --  33.7*  PLT 303  --   --   --  307  LABPROT 17.2*  --  16.1*  --  15.4*  INR 1.40  --  1.27  --  1.21  HEPARINUNFRC 0.79* 0.77*  --  0.14* 0.21*  CREATININE 1.04  --  0.93  --  1.00    Estimated Creatinine Clearance: 82.7 mL/min (by C-G formula based on Cr of 1).  Assessment: 60yom on coumadin pta for hx LV thrombus, transitioned to IV heparin on admission pending cath. S/P cath 4/24, found to have NICM, coumadin resumed with heparin bridge. On 4/30, INR was 1.98 and heparin was stopped due to drop in H/H - repeat H/H was better. Patient is now anticipating LVAD this admission so coumadin held and heparin bridge restarted 5/1.  Heparin level was low this morning and rate was increased to 1400 units/hr. Follow up level was never check as patient went for right heart cath. Per Dr. Shirlee Latch, ok to resume heparin now post-cath.  Goal of Therapy:  Heparin level 0.3-0.7 units/ml Monitor platelets by anticoagulation protocol: Yes   Plan:  1) Resume heparin at 1400 units/hr 2) Check 6 hour heparin level 3) Daily heparin level and CBC  Fredrik Rigger 11/18/2015,3:35 PM

## 2015-11-18 NOTE — Progress Notes (Signed)
Pt heart rhythm appeared abnormal on monitor. Completed an EKG. MD confirmed sinus tachycardia with a left bundle branch block.

## 2015-11-18 NOTE — H&P (View-Only) (Signed)
Patient ID: Matthew Vaughan, male   DOB: July 16, 1956, 60 y.o.   MRN: [id number]   SUBJECTIVE:  Failed Milrinone wean. Remains on milrinone 0.25 mcg + Norepi 3 mcg. Off tosemide/spiro/lisonopril. EGD/Colonscopy negative.   Denies SOB. Frustrated he can't eat.   LHC/RHC (11/07/15) Left Main  No significant coronary disease.      Left Anterior Descending  No significant coronary disease.     Ramus Intermedius  Moderate vessel, no significant coronary disease.     Left Circumflex  No significant coronary disease.     Right Coronary Artery  No significant coronary disease.      Wall Motion    Not done, possible LV thrombus.   RHC Procedural Findings (mmHg): Hemodynamics RA mean 22 RV 56/25 PA 57/34, mean 25 PCWP mean 35 AO 83/68  Oxygen saturations: PA 36% AO 100%  Cardiac Output (Fick) 2.72  Cardiac Index (Fick) 1.39  PVR 2.6 WU       Cardiac MRI (4/25): Severe LV dilation with EF 8%, small thrombus on mid anterior wall, mildly dilated RV with moderately decreased systolic function.  Patchy LGE pattern in inferior wall, ?prior myocarditis.   Scheduled Meds: . digoxin  0.125 mg Oral Daily  . feeding supplement (ENSURE ENLIVE)  237 mL Oral BID BM  . sodium chloride flush  10-40 mL Intracatheter Q12H  . sodium chloride flush  3 mL Intravenous Q12H  . sodium chloride flush  3 mL Intravenous Q12H  . sodium chloride flush  3 mL Intravenous Q12H  . spironolactone  12.5 mg Oral Daily   Continuous Infusions: . sodium chloride 10 mL/hr at 11/18/15 0026  . sodium chloride Stopped (11/17/15 1300)  . heparin 1,400 Units/hr (11/18/15 0622)  . milrinone 0.25 mcg/kg/min (11/17/15 2000)  . norepinephrine (LEVOPHED) Adult infusion 3 mcg/min (11/17/15 2018)   PRN Meds:.sodium chloride, sodium chloride, sodium chloride, sodium chloride, acetaminophen, nitroGLYCERIN, ondansetron (ZOFRAN) IV, sodium chloride flush, sodium chloride flush, sodium chloride flush, sodium chloride  flush   Filed Vitals:   11/18/15 0418 11/18/15 0500 11/18/15 0600 11/18/15 0800  BP: 87/71   88/76  Pulse: 104 100 109 98  Temp: 98.1 F (36.7 C)     TempSrc: Oral     Resp: 20     Height:      Weight:  160 lb 0.9 oz (72.6 kg)    SpO2: 99% 100% 99% 100%    Intake/Output Summary (Last 24 hours) at 11/18/15 0815 Last data filed at 11/18/15 0800  Gross per 24 hour  Intake 2244.11 ml  Output   1400 ml  Net 844.11 ml    LABS: Basic Metabolic Panel:  Recent Labs  62/13/08 0400 11/18/15 0300  NA 130* 132*  K 5.2* 4.6  CL 101 102  CO2 21* 22  GLUCOSE 259* 176*  BUN 12 17  CREATININE 0.93 1.00  CALCIUM 9.2 9.0   Liver Function Tests:  Recent Labs  11/16/15 0335  AST 58*  ALT 70*  ALKPHOS 98  BILITOT 1.0  PROT 7.5  ALBUMIN 2.8*   No results for input(s): LIPASE, AMYLASE in the last 72 hours. CBC:  Recent Labs  11/16/15 0335 11/18/15 0300  WBC 4.2 4.7  HGB 11.3* 10.8*  HCT 34.9* 33.7*  MCV 102.3* 105.3*  PLT 303 307   Cardiac Enzymes: No results for input(s): CKTOTAL, CKMB, CKMBINDEX, TROPONINI in the last 72 hours. BNP: Invalid input(s): POCBNP D-Dimer: No results for input(s): DDIMER in the last 72 hours. Hemoglobin A1C: No  results for input(s): HGBA1C in the last 72 hours. Fasting Lipid Panel: No results for input(s): CHOL, HDL, LDLCALC, TRIG, CHOLHDL, LDLDIRECT in the last 72 hours. Thyroid Function Tests: No results for input(s): TSH, T4TOTAL, T3FREE, THYROIDAB in the last 72 hours.  Invalid input(s): FREET3 Anemia Panel: No results for input(s): VITAMINB12, FOLATE, FERRITIN, TIBC, IRON, RETICCTPCT in the last 72 hours.  RADIOLOGY: Ct Abdomen Pelvis Wo Contrast  11/09/2015  CLINICAL DATA:  Preoperative evaluation, prior to surgical treatment of nonischemic cardiomyopathy and left ventricular thrombus. Initial encounter. EXAM: CT CHEST, ABDOMEN AND PELVIS WITHOUT CONTRAST TECHNIQUE: Multidetector CT imaging of the chest, abdomen and pelvis  was performed following the standard protocol without IV contrast. COMPARISON:  None. FINDINGS: CT CHEST Patchy peripheral opacities within the right upper and middle lobes likely reflect scarring, though mild infection could have a similar appearance. The lungs are otherwise grossly clear. No pleural effusion or pneumothorax is seen. No masses are identified. Minimal blebs are noted at the lung apices. The heart is mildly enlarged, with left ventricular enlargement. A right PICC is noted ending about the proximal SVC. No pericardial effusion is identified. The great vessels are grossly unremarkable in appearance. No mediastinal lymphadenopathy is seen. The visualized portions of thyroid gland are unremarkable. No axillary lymphadenopathy is seen. No acute osseous abnormalities are identified. CT ABDOMEN AND PELVIS The liver and spleen are unremarkable in appearance. The gallbladder is within normal limits. The pancreas and adrenal glands are unremarkable. The kidneys are unremarkable in appearance. There is no evidence of hydronephrosis. No renal or ureteral stones are seen. No perinephric stranding is appreciated. No free fluid is identified. The small bowel is unremarkable in appearance. The stomach is within normal limits. No acute vascular abnormalities are seen. The appendix is normal in caliber, without evidence of appendicitis. The colon is grossly unremarkable in appearance. The bladder is mildly distended and grossly remarkable. The prostate remains normal in size. No inguinal lymphadenopathy is seen. No acute osseous abnormalities are identified. IMPRESSION: 1. Patchy peripheral opacities within the right upper and middle lobes likely reflect scarring, though mild infection could have a similar appearance. 2. Mild cardiomegaly, with left ventricular enlargement. 3. No acute abnormality seen within the abdomen or pelvis. Electronically Signed   By: Roanna Raider M.D.   On: 11/09/2015 20:00   Dg Chest 2  View  11/04/2015  CLINICAL DATA:  Chest pain and shortness of breath for 1 day EXAM: CHEST  2 VIEW COMPARISON:  None. FINDINGS: Cardiac enlargement without vascular congestion. Focal area of nodular consolidation in the right upper lung probably represents focal pneumonia. Soft tissue lesion is not excluded. Followup PA and lateral chest X-ray is recommended in 3-4 weeks following trial of antibiotic therapy to ensure resolution and exclude underlying malignancy. Left lung is clear. No blunting of costophrenic angles. No pneumothorax. Mediastinal contours appear intact. Degenerative changes in the spine and shoulders. IMPRESSION: Cardiac enlargement. Focal nodular consolidation at the right upper lung likely represent pneumonia. Followup PA and lateral chest X-ray is recommended in 3-4 weeks following trial of antibiotic therapy to ensure resolution and exclude underlying malignancy. Electronically Signed   By: Burman Nieves M.D.   On: 11/04/2015 23:00   Ct Chest Wo Contrast  11/09/2015  CLINICAL DATA:  Preoperative evaluation, prior to surgical treatment of nonischemic cardiomyopathy and left ventricular thrombus. Initial encounter. EXAM: CT CHEST, ABDOMEN AND PELVIS WITHOUT CONTRAST TECHNIQUE: Multidetector CT imaging of the chest, abdomen and pelvis was performed following the standard protocol  without IV contrast. COMPARISON:  None. FINDINGS: CT CHEST Patchy peripheral opacities within the right upper and middle lobes likely reflect scarring, though mild infection could have a similar appearance. The lungs are otherwise grossly clear. No pleural effusion or pneumothorax is seen. No masses are identified. Minimal blebs are noted at the lung apices. The heart is mildly enlarged, with left ventricular enlargement. A right PICC is noted ending about the proximal SVC. No pericardial effusion is identified. The great vessels are grossly unremarkable in appearance. No mediastinal lymphadenopathy is seen. The  visualized portions of thyroid gland are unremarkable. No axillary lymphadenopathy is seen. No acute osseous abnormalities are identified. CT ABDOMEN AND PELVIS The liver and spleen are unremarkable in appearance. The gallbladder is within normal limits. The pancreas and adrenal glands are unremarkable. The kidneys are unremarkable in appearance. There is no evidence of hydronephrosis. No renal or ureteral stones are seen. No perinephric stranding is appreciated. No free fluid is identified. The small bowel is unremarkable in appearance. The stomach is within normal limits. No acute vascular abnormalities are seen. The appendix is normal in caliber, without evidence of appendicitis. The colon is grossly unremarkable in appearance. The bladder is mildly distended and grossly remarkable. The prostate remains normal in size. No inguinal lymphadenopathy is seen. No acute osseous abnormalities are identified. IMPRESSION: 1. Patchy peripheral opacities within the right upper and middle lobes likely reflect scarring, though mild infection could have a similar appearance. 2. Mild cardiomegaly, with left ventricular enlargement. 3. No acute abnormality seen within the abdomen or pelvis. Electronically Signed   By: Roanna Raider M.D.   On: 11/09/2015 20:00   Dg Chest Port 1 View  11/17/2015  CLINICAL DATA:  Patient with history of shortness breath. EXAM: PORTABLE CHEST 1 VIEW COMPARISON:  Chest radiograph 11/04/2015.  Chest CT 11/09/2015. FINDINGS: Right upper extremity PICC line is present with tip terminating in the superior vena cava. Multiple monitoring leads overlie the patient. Stable enlarged cardiac mediastinal contours. Slight improvement in previously described irregular consolidation right upper lobe. Remainder of the lungs are clear. No pleural effusion or pneumothorax. IMPRESSION: Suggestion of slight interval improvement right upper lobe consolidation, potentially represent pneumonia. Followup PA and lateral  chest X-ray is recommended in 3-4 weeks following trial of antibiotic therapy to ensure resolution and exclude underlying malignancy. Electronically Signed   By: Annia Belt M.D.   On: 11/17/2015 09:15   Matthew Card Morphology Wo/w Cm  11/09/2015  CLINICAL DATA:  Nonischemic cardiomyopathy EXAM: CARDIAC MRI TECHNIQUE: The patient was scanned on a 1.5 Tesla GE magnet. A dedicated cardiac coil was used. Functional imaging was done using Fiesta sequences. 2,3, and 4 chamber views were done to assess for RWMA's. Modified Simpson's rule using a short axis stack was used to calculate an ejection fraction on a dedicated work [employer]. The patient received 25 cc of Multihance. After 10 minutes inversion recovery sequences were used to assess for infiltration and scar tissue. CONTRAST:  25 cc Multihance FINDINGS: Limited images of the lung fields showed no significant abnormalities. Markedly dilated left ventricle with relatively thin walls. Diffuse severe hypokinesis, EF 8%. There was a small LV thrombus adherent to the mid anterior wall. The right ventricle was mildly dilated with moderately decreased systolic function. There was mild to moderate central mitral regurgitation. Trileaflet aortic valve with no significant stenosis or regurgitation. Moderate left atrial enlargement. Mild right atrial enlargement. On delayed enhancement imaging, there was patchy late gadolinium enhancement (LGE) in  the inferior wall. There was a small area of full thickness LGE in the basal inferior wall. There was a small area of near-full thickness LGE in the mid inferior wall. Finally, there were a couple of small areas of < 25% thickness subendocardial LGE in the apical inferior wall. MEASUREMENTS: LVEDV 533 mL LV SV 42 mL LV EF 8% IMPRESSION: 1. Markedly dilated left ventricle with very severe diffuse hypokinesis, EF 8%. 2.  Mildly dilated RV with moderately decreased systolic function. 3.  Small LV mid anterior wall  thrombus. 4. Patchy inferior LGE pattern as described above. This is of uncertain significance, could be related to prior myocarditis. Matthew Vaughan Electronically Signed   By: [name] M.D.   On: 11/09/2015 00:43    PHYSICAL EXAM CVP ~3 General: NAD. Sitting in chair.  Neck: JVP flat, no thyromegaly or thyroid nodule.  Lungs: Clear to auscultation bilaterally with normal respiratory effort. CV: Lateral PMI.  Heart regular S1/S2, loud s3 no murmur.  No edema.   Abdomen: Soft, nontender, no hepatosplenomegaly, no distention.  Neurologic: Alert and oriented x 3.  Psych: Normal affect. Extremities: No clubbing or cyanosis.   TELEMETRY: Reviewed telemetry pt in NSR 100s  ASSESSMENT AND PLAN: 60 yo with history of nonischemic cardiomyopathy and apparently a prior LV thrombus presented with acute/chronic systolic CHF. He is followed by a cardiologist in [place].  1. Acute/chronic systolic CHF -> cardiogenic shock:  Nonischemic cardiomyopathy.  Cause uncertain => not a heavy drinker, had one sister with what sounds like sudden death.  Possible viral myocarditis. EF has been very low per his report, do not have echo done yet here.  Had chest pain and elevated TnI at admission, so had LHC/RHC 4/24.  No coronary disease noted, no evidence for cardioembolism from LV thrombus either.  RHC showed markedly elevated filling pressures and low cardiac index at 1.39.  He was started on milrinone and IV Lasix.  Cardiac MRI was done, showing severe LV dilation with EF 8%, small LV thrombus, and mildly dilated RV with moderately decreased systolic function.  There was a patchy LGE pattern in the inferior wall, ?prior myocarditis.  HIV negative.  Milrinone restarted 4/28 now at 0.25. Norepinephrine begun at low dose for soft BP. Today CO-OX marginal at 55% on milrinone + norepi.  - CVP 3, can hold torsemide today but may need to restart soon. - Continue current milrinone and norepinephrine, increase milrinone to  0.375 with co-ox 55%.   - Continue current digoxin. Dig level 0.3   Cleda Daub held yesterday. K  4.6    - RHC today  - Off lisinopril now with soft BP.   - M-spike noted on SPEP.  Serum immunofixation negative. Does not look like cardiac amyloidosis on MRI.  - He has a LBBB and no ICD (pending Medicaid).  - Matthew Vaughan is milrinone-dependent.  He has no device.  He has Medicaid pending.  We are proceeding with LVAD workup and he has been seen by surgeon. Keep in house until VAD => plan for next Monday or Tuesday, will need to confirm.   2. LV thrombus: Small thrombus on MRI. Off  warfarin and cover with heparin gtt. 3. Anemia: unclear source. Today's hemoglobin is stable. Continue Protonix. EGD and Colonoscopy unremarkable.   RHC later today.   Amy Clegg NP-C  11/18/2015 8:15 AM   Patient seen with NP, agree with the above note.  He is symptomatically stable though co-ox somewhat marginal.  Feels good  overall, has been walking in halls.  Planning for LVAD implant next week, workup completed (scopes ok yesterday).   - Increase milrinone to 0.375 and continue low dose norepinephrine.  - Plan RHC today to reassess filling pressures and output.   [name] 11/18/2015

## 2015-11-18 NOTE — Interval H&P Note (Signed)
History and Physical Interval Note:  11/18/2015 12:50 PM  Matthew Vaughan  has presented today for surgery, with the diagnosis of heart failure  The various methods of treatment have been discussed with the patient and family. After consideration of risks, benefits and other options for treatment, the patient has consented to  Procedure(s): Right Heart Cath (N/A) as a surgical intervention .  The patient's history has been reviewed, patient examined, no change in status, stable for surgery.  I have reviewed the patient's chart and labs.  Questions were answered to the patient's satisfaction.     Cece Milhouse [employer]

## 2015-11-18 NOTE — Progress Notes (Signed)
Refused insertion of PIV to left forearm for heparin.Preferred to be pricked on his arms for heparin level by lab.

## 2015-11-19 LAB — BASIC METABOLIC PANEL
Anion gap: 8 (ref 5–15)
BUN: 19 mg/dL (ref 6–20)
CALCIUM: 8.8 mg/dL — AB (ref 8.9–10.3)
CHLORIDE: 103 mmol/L (ref 101–111)
CO2: 23 mmol/L (ref 22–32)
CREATININE: 1.04 mg/dL (ref 0.61–1.24)
GFR calc non Af Amer: >60 mL/min (ref >60)
GLUCOSE: 175 mg/dL — AB (ref 65–99)
Potassium: 4.1 mmol/L (ref 3.5–5.1)
Sodium: 134 mmol/L — ABNORMAL LOW (ref 135–145)

## 2015-11-19 LAB — MAGNESIUM: MAGNESIUM: 1.6 mg/dL — AB (ref 1.7–2.4)

## 2015-11-19 LAB — CBC
HCT: 32.1 % — ABNORMAL LOW (ref 39.0–52.0)
Hemoglobin: 10.5 g/dL — ABNORMAL LOW (ref 13.0–17.0)
MCH: 34.3 pg — AB (ref 26.0–34.0)
MCHC: 32.7 g/dL (ref 30.0–36.0)
MCV: 104.9 fL — AB (ref 78.0–100.0)
PLATELETS: 297 10*3/uL (ref 150–400)
RBC: 3.06 MIL/uL — AB (ref 4.22–5.81)
RDW: 14.1 % (ref 11.5–15.5)
WBC: 5 10*3/uL (ref 4.0–10.5)

## 2015-11-19 LAB — PREPARE RBC (CROSSMATCH)

## 2015-11-19 LAB — CARBOXYHEMOGLOBIN
CARBOXYHEMOGLOBIN: 1.5 % (ref 0.5–1.5)
Methemoglobin: 0.7 % (ref 0.0–1.5)
O2 SAT: 63 %
TOTAL HEMOGLOBIN: 11.5 g/dL — AB (ref 13.5–18.0)

## 2015-11-19 LAB — PROTIME-INR
INR: 1.3 (ref 0.00–1.49)
PROTHROMBIN TIME: 16.3 s — AB (ref 11.6–15.2)

## 2015-11-19 LAB — SURGICAL PCR SCREEN
MRSA, PCR: NEGATIVE
Staphylococcus aureus: NEGATIVE

## 2015-11-19 LAB — HEPARIN LEVEL (UNFRACTIONATED)
HEPARIN UNFRACTIONATED: 0.24 [IU]/mL — AB (ref 0.30–0.70)
Heparin Unfractionated: 0.77 IU/mL — ABNORMAL HIGH (ref 0.30–0.70)

## 2015-11-19 LAB — VITAMIN B1: Vitamin B1 (Thiamine): 113.8 nmol/L (ref 66.5–200.0)

## 2015-11-19 MED ORDER — POLYETHYLENE GLYCOL 3350 17 G PO PACK
17.0000 g | PACK | Freq: Once | ORAL | Status: AC
  Administered 2015-11-19: 17 g via ORAL
  Filled 2015-11-19: qty 1

## 2015-11-19 MED ORDER — HEPARIN BOLUS VIA INFUSION
1000.0000 [IU] | Freq: Once | INTRAVENOUS | Status: AC
  Administered 2015-11-19: 1000 [IU] via INTRAVENOUS
  Filled 2015-11-19: qty 1000

## 2015-11-19 NOTE — Progress Notes (Signed)
ANTICOAGULATION CONSULT NOTE - Follow Up Consult  Pharmacy Consult for Heparin Indication: LV thrombus  No Known Allergies  Patient Measurements: Height: 5\' 11"  (180.3 cm) Weight: 160 lb 0.9 oz (72.6 kg) IBW/kg (Calculated) : 75.3  Vital Signs: Temp: 98.3 F (36.8 C) (05/06 0011) Temp Source: Oral (05/06 0011) BP: 92/69 mmHg (05/06 0011) Pulse Rate: 102 (05/06 0011)  Labs:  Recent Labs  11/16/15 0335  11/17/15 0400 11/17/15 2020 11/18/15 0300 11/18/15 2229  HGB 11.3*  --   --   --  10.8*  --   HCT 34.9*  --  35.5*  --  33.7*  --   PLT 303  --   --   --  307  --   LABPROT 17.2*  --  16.1*  --  15.4*  --   INR 1.40  --  1.27  --  1.21  --   HEPARINUNFRC 0.79*  < >  --  0.14* 0.21* 0.32  CREATININE 1.04  --  0.93  --  1.00  --   < > = values in this interval not displayed.  Estimated Creatinine Clearance: 82.7 mL/min (by C-G formula based on Cr of 1).  Assessment: 60yom on coumadin pta for hx LV thrombus, transitioned to IV heparin on admission pending cath. S/P cath 4/24, found to have NICM, coumadin resumed with heparin bridge. On 4/30, INR was 1.98 and heparin was stopped due to drop in H/H - repeat H/H was better. Patient is now anticipating LVAD this admission so coumadin held and heparin bridge restarted 5/1.  Heparin level now therapeutic on 1400 units/hr. CBC stable. No bleeding noted.  Goal of Therapy:  Heparin level 0.3-0.7 units/ml Monitor platelets by anticoagulation protocol: Yes   Plan:  Continue heparin at 1400 units/hr F/u a.m. heparin level to confirm therapeutic  Christoper Fabian, PharmD, BCPS Clinical pharmacist, pager [phone] 11/19/2015,12:17 AM

## 2015-11-19 NOTE — Progress Notes (Signed)
1 Day Post-Op Procedure(s) (LRB): Right Heart Cath (N/A) Subjective: Patient had a good day without heart failure symptoms Good appetite, angulating in hall daily CVP remains low with adequate renal function HeartMate 2 implantation for nonischemic cardiomyopathy and acute on chronic systolic heart failure plan for destination therapy indication Monday a.m. The patient understands he is not currently a candidate for cardiac transplantation because he has not been evaluated by transplant center and on an active transplant waiting list. He understands that he potentially could be a transplant candidate.  Objective: Vital signs in last 24 hours: Temp:  [97.4 F (36.3 C)-98.3 F (36.8 C)] 97.7 F (36.5 C) (05/06 1625) Pulse Rate:  [98-102] 99 (05/06 1625) Cardiac Rhythm:  [-] Normal sinus rhythm;Heart block (05/06 0940) Resp:  [16] 16 (05/06 1625) BP: (92-107)/(69-82) 97/70 mmHg (05/06 1625) SpO2:  [96 %-99 %] 98 % (05/06 1625) Weight:  [159 lb 2.8 oz (72.2 kg)] 159 lb 2.8 oz (72.2 kg) (05/06 0500)  Hemodynamic parameters for last 24 hours: CVP:  [2 mmHg-16 mmHg] 3 mmHg  Intake/Output from previous day: 05/05 0701 - 05/06 0700 In: 1303.7 [P.O.:390; I.V.:913.7] Out: 2000 [Urine:2000] Intake/Output this shift: Total I/O In: 357.7 [P.O.:200; I.V.:157.7] Out: 750 [Urine:750]  Alert sitting up in chair Breath sounds clear Positive S3 gallop No murmur No edema  Lab Results:  Recent Labs  11/18/15 0300 11/19/15 0340  WBC 4.7 5.0  HGB 10.8* 10.5*  HCT 33.7* 32.1*  PLT 307 297   BMET:  Recent Labs  11/18/15 0300 11/19/15 0340  NA 132* 134*  K 4.6 4.1  CL 102 103  CO2 22 23  GLUCOSE 176* 175*  BUN 17 19  CREATININE 1.00 1.04  CALCIUM 9.0 8.8*    PT/INR:  Recent Labs  11/19/15 0340  LABPROT 16.3*  INR 1.30   ABG    Component Value Date/Time   PHART 7.480* 11/09/2015 0856   HCO3 23.9 11/18/2015 1311   HCO3 24.6* 11/18/2015 1311   TCO2 25 11/18/2015 1311   TCO2 26 11/18/2015 1311   ACIDBASEDEF 1.0 11/18/2015 1311   ACIDBASEDEF 1.0 11/18/2015 1311   O2SAT 63.0 11/19/2015 0350   CBG (last 3)  No results for input(s): GLUCAP in the last 72 hours.  Assessment/Plan: S/P Procedure(s) (LRB): Right Heart Cath (N/A) LVAD implant Monday a.m.   LOS: 14 days    Matthew Vaughan 11/19/2015

## 2015-11-19 NOTE — Progress Notes (Signed)
Blood bank called, they have 2 units PRBC's ready for pt for surgery Monday AM.   Eliane Decree, RN

## 2015-11-19 NOTE — Progress Notes (Signed)
ANTICOAGULATION CONSULT NOTE - Follow Up Consult  Pharmacy Consult for Heparin Indication: LV thrombus  No Known Allergies  Patient Measurements: Height: 5\' 11"  (180.3 cm) Weight: 159 lb 2.8 oz (72.2 kg) IBW/kg (Calculated) : 75.3  Vital Signs: Temp: 97.7 F (36.5 C) (05/06 1625) Temp Source: Oral (05/06 1625) BP: 97/70 mmHg (05/06 1625) Pulse Rate: 99 (05/06 1625)  Labs:  Recent Labs  11/17/15 0400  11/18/15 0300 11/18/15 2229 11/19/15 0340 11/19/15 0637 11/19/15 1719  HGB  --   --  10.8*  --  10.5*  --   --   HCT 35.5*  --  33.7*  --  32.1*  --   --   PLT  --   --  307  --  297  --   --   LABPROT 16.1*  --  15.4*  --  16.3*  --   --   INR 1.27  --  1.21  --  1.30  --   --   HEPARINUNFRC  --   < > 0.21* 0.32  --  0.24* 0.77*  CREATININE 0.93  --  1.00  --  1.04  --   --   < > = values in this interval not displayed.  Estimated Creatinine Clearance: 79.1 mL/min (by C-G formula based on Cr of 1.04).  Assessment: 60 yo M on warfarin PTA for hx LV thrombus, transitioned to IV heparin on admission pending cath. S/P cath 4/24, found to have NICM, warfarin resumed with heparin bridge. On 4/30, INR was 1.98 and heparin was stopped due to drop in H/H - repeat H/H was better. Patient is now anticipating LVAD on 5/8, so warfarin was held and heparin bridge restarted 5/1.  Heparin level this evening is SUPRAtherapeutic (HL 0.77 << 0.24, goal of 0.3-0.7). No issues with the heparin drip or bleeding reported per RN.  Goal of Therapy:  Heparin level 0.3-0.7 units/ml Monitor platelets by anticoagulation protocol: Yes   Plan:  1. Decrease Heparin to 1450 units/hr (14.5 ml/hr) 2. Will continue to monitor for any signs/symptoms of bleeding and will follow up with heparin level in 6 hours   Georgina Pillion, PharmD, BCPS Clinical Pharmacist Pager: [phone] 11/19/2015 6:07 PM

## 2015-11-19 NOTE — Progress Notes (Signed)
ANTICOAGULATION CONSULT NOTE - Follow Up Consult  Pharmacy Consult for Heparin Indication: LV thrombus  No Known Allergies  Patient Measurements: Height: 5\' 11"  (180.3 cm) Weight: 159 lb 2.8 oz (72.2 kg) IBW/kg (Calculated) : 75.3  Vital Signs: Temp: 98.2 F (36.8 C) (05/06 0500) Temp Source: Oral (05/06 0500) BP: 103/82 mmHg (05/06 0500) Pulse Rate: 102 (05/06 0011)  Labs:  Recent Labs  11/17/15 0400  11/18/15 0300 11/18/15 2229 11/19/15 0340 11/19/15 0637  HGB  --   --  10.8*  --  10.5*  --   HCT 35.5*  --  33.7*  --  32.1*  --   PLT  --   --  307  --  297  --   LABPROT 16.1*  --  15.4*  --  16.3*  --   INR 1.27  --  1.21  --  1.30  --   HEPARINUNFRC  --   < > 0.21* 0.32  --  0.24*  CREATININE 0.93  --  1.00  --  1.04  --   < > = values in this interval not displayed.  Estimated Creatinine Clearance: 79.1 mL/min (by C-G formula based on Cr of 1.04).  Assessment: 60 yo M on warfarin PTA for hx LV thrombus, transitioned to IV heparin on admission pending cath. S/P cath 4/24, found to have NICM, warfarin resumed with heparin bridge. On 4/30, INR was 1.98 and heparin was stopped due to drop in H/H - repeat H/H was better. Patient is now anticipating LVAD on 5/8, so warfarin was held and heparin bridge restarted 5/1.  Heparin level now subtherapeutic after 1 therapeutic level on 1400 units/hr. CBC stable. No bleeding noted, no problems with line per nurse.  Goal of Therapy:  Heparin level 0.3-0.7 units/ml Monitor platelets by anticoagulation protocol: Yes   Plan:  --Bolus 1000 units x1 --Increase heparin to 1550 units/hr --F/U 6 hr HL at 1500 --Daily HL/CBC --LVAD on 5/8  Arcola Jansky, PharmD Clinical Pharmacy Resident Pager: [phone]  11/19/2015,8:09 AM

## 2015-11-19 NOTE — Progress Notes (Signed)
Patient ID: Matthew Vaughan, male   DOB: [birth year], 60 y.o.   MRN: [id number]   SUBJECTIVE:   Feels good. RHC yesterday on milrinone and norepi shows optimized hemodynamics.  Remains on milrinone 0/375, norepi 3. CVP 3. Denies SOB. Ambulating    Cardiac MRI (4/25): Severe LV dilation with EF 8%, small thrombus on mid anterior wall, mildly dilated RV with moderately decreased systolic function.  Patchy LGE pattern in inferior wall, ?prior myocarditis.   Scheduled Meds: . digoxin  0.125 mg Oral Daily  . feeding supplement (ENSURE ENLIVE)  237 mL Oral BID BM  . sodium chloride flush  10-40 mL Intracatheter Q12H  . sodium chloride flush  3 mL Intravenous Q12H  . spironolactone  12.5 mg Oral Daily   Continuous Infusions: . sodium chloride Stopped (11/19/15 0900)  . heparin 1,550 Units/hr (11/19/15 0800)  . milrinone 0.375 mcg/kg/min (11/19/15 0700)  . norepinephrine (LEVOPHED) Adult infusion 3 mcg/min (11/19/15 0700)   PRN Meds:.sodium chloride, sodium chloride, acetaminophen, acetaminophen, nitroGLYCERIN, ondansetron (ZOFRAN) IV, sodium chloride flush   Filed Vitals:   11/19/15 0011 11/19/15 0400 11/19/15 0500 11/19/15 0940  BP: 92/69 103/82 103/82 107/81  Pulse: 102     Temp: 98.3 F (36.8 C) 98.2 F (36.8 C) 98.2 F (36.8 C) 97.4 F (36.3 C)  TempSrc: Oral Oral Oral Oral  Resp:    16  Height:      Weight:   72.2 kg (159 lb 2.8 oz)   SpO2: 96% 99% 99% 98%    Intake/Output Summary (Last 24 hours) at 11/19/15 1148 Last data filed at 11/19/15 1100  Gross per 24 hour  Intake 1498.6 ml  Output   1950 ml  Net -451.4 ml    LABS: Basic Metabolic Panel:  Recent Labs  40/98/11 0300 11/19/15 0340  NA 132* 134*  K 4.6 4.1  CL 102 103  CO2 22 23  GLUCOSE 176* 175*  BUN 17 19  CREATININE 1.00 1.04  CALCIUM 9.0 8.8*   Liver Function Tests: No results for input(s): AST, ALT, ALKPHOS, BILITOT, PROT, ALBUMIN in the last 72 hours. No results for input(s): LIPASE,  AMYLASE in the last 72 hours. CBC:  Recent Labs  11/18/15 0300 11/19/15 0340  WBC 4.7 5.0  HGB 10.8* 10.5*  HCT 33.7* 32.1*  MCV 105.3* 104.9*  PLT 307 297   Cardiac Enzymes: No results for input(s): CKTOTAL, CKMB, CKMBINDEX, TROPONINI in the last 72 hours. BNP: Invalid input(s): POCBNP D-Dimer: No results for input(s): DDIMER in the last 72 hours. Hemoglobin A1C: No results for input(s): HGBA1C in the last 72 hours. Fasting Lipid Panel: No results for input(s): CHOL, HDL, LDLCALC, TRIG, CHOLHDL, LDLDIRECT in the last 72 hours. Thyroid Function Tests: No results for input(s): TSH, T4TOTAL, T3FREE, THYROIDAB in the last 72 hours.  Invalid input(s): FREET3 Anemia Panel: No results for input(s): VITAMINB12, FOLATE, FERRITIN, TIBC, IRON, RETICCTPCT in the last 72 hours.  RADIOLOGY: Ct Abdomen Pelvis Wo Contrast  11/09/2015  CLINICAL DATA:  Preoperative evaluation, prior to surgical treatment of nonischemic cardiomyopathy and left ventricular thrombus. Initial encounter. EXAM: CT CHEST, ABDOMEN AND PELVIS WITHOUT CONTRAST TECHNIQUE: Multidetector CT imaging of the chest, abdomen and pelvis was performed following the standard protocol without IV contrast. COMPARISON:  None. FINDINGS: CT CHEST Patchy peripheral opacities within the right upper and middle lobes likely reflect scarring, though mild infection could have a similar appearance. The lungs are otherwise grossly clear. No pleural effusion or pneumothorax is seen. No masses are identified.  Minimal blebs are noted at the lung apices. The heart is mildly enlarged, with left ventricular enlargement. A right PICC is noted ending about the proximal SVC. No pericardial effusion is identified. The great vessels are grossly unremarkable in appearance. No mediastinal lymphadenopathy is seen. The visualized portions of thyroid gland are unremarkable. No axillary lymphadenopathy is seen. No acute osseous abnormalities are identified. CT ABDOMEN  AND PELVIS The liver and spleen are unremarkable in appearance. The gallbladder is within normal limits. The pancreas and adrenal glands are unremarkable. The kidneys are unremarkable in appearance. There is no evidence of hydronephrosis. No renal or ureteral stones are seen. No perinephric stranding is appreciated. No free fluid is identified. The small bowel is unremarkable in appearance. The stomach is within normal limits. No acute vascular abnormalities are seen. The appendix is normal in caliber, without evidence of appendicitis. The colon is grossly unremarkable in appearance. The bladder is mildly distended and grossly remarkable. The prostate remains normal in size. No inguinal lymphadenopathy is seen. No acute osseous abnormalities are identified. IMPRESSION: 1. Patchy peripheral opacities within the right upper and middle lobes likely reflect scarring, though mild infection could have a similar appearance. 2. Mild cardiomegaly, with left ventricular enlargement. 3. No acute abnormality seen within the abdomen or pelvis. Electronically Signed   By: Roanna Raider M.D.   On: 11/09/2015 20:00   Dg Chest 2 View  11/04/2015  CLINICAL DATA:  Chest pain and shortness of breath for 1 day EXAM: CHEST  2 VIEW COMPARISON:  None. FINDINGS: Cardiac enlargement without vascular congestion. Focal area of nodular consolidation in the right upper lung probably represents focal pneumonia. Soft tissue lesion is not excluded. Followup PA and lateral chest X-ray is recommended in 3-4 weeks following trial of antibiotic therapy to ensure resolution and exclude underlying malignancy. Left lung is clear. No blunting of costophrenic angles. No pneumothorax. Mediastinal contours appear intact. Degenerative changes in the spine and shoulders. IMPRESSION: Cardiac enlargement. Focal nodular consolidation at the right upper lung likely represent pneumonia. Followup PA and lateral chest X-ray is recommended in 3-4 weeks following  trial of antibiotic therapy to ensure resolution and exclude underlying malignancy. Electronically Signed   By: Burman Nieves M.D.   On: 11/04/2015 23:00   Ct Chest Wo Contrast  11/09/2015  CLINICAL DATA:  Preoperative evaluation, prior to surgical treatment of nonischemic cardiomyopathy and left ventricular thrombus. Initial encounter. EXAM: CT CHEST, ABDOMEN AND PELVIS WITHOUT CONTRAST TECHNIQUE: Multidetector CT imaging of the chest, abdomen and pelvis was performed following the standard protocol without IV contrast. COMPARISON:  None. FINDINGS: CT CHEST Patchy peripheral opacities within the right upper and middle lobes likely reflect scarring, though mild infection could have a similar appearance. The lungs are otherwise grossly clear. No pleural effusion or pneumothorax is seen. No masses are identified. Minimal blebs are noted at the lung apices. The heart is mildly enlarged, with left ventricular enlargement. A right PICC is noted ending about the proximal SVC. No pericardial effusion is identified. The great vessels are grossly unremarkable in appearance. No mediastinal lymphadenopathy is seen. The visualized portions of thyroid gland are unremarkable. No axillary lymphadenopathy is seen. No acute osseous abnormalities are identified. CT ABDOMEN AND PELVIS The liver and spleen are unremarkable in appearance. The gallbladder is within normal limits. The pancreas and adrenal glands are unremarkable. The kidneys are unremarkable in appearance. There is no evidence of hydronephrosis. No renal or ureteral stones are seen. No perinephric stranding is appreciated. No free fluid  is identified. The small bowel is unremarkable in appearance. The stomach is within normal limits. No acute vascular abnormalities are seen. The appendix is normal in caliber, without evidence of appendicitis. The colon is grossly unremarkable in appearance. The bladder is mildly distended and grossly remarkable. The prostate remains  normal in size. No inguinal lymphadenopathy is seen. No acute osseous abnormalities are identified. IMPRESSION: 1. Patchy peripheral opacities within the right upper and middle lobes likely reflect scarring, though mild infection could have a similar appearance. 2. Mild cardiomegaly, with left ventricular enlargement. 3. No acute abnormality seen within the abdomen or pelvis. Electronically Signed   By: Roanna Raider M.D.   On: 11/09/2015 20:00   Dg Chest Port 1 View  11/17/2015  CLINICAL DATA:  Patient with history of shortness breath. EXAM: PORTABLE CHEST 1 VIEW COMPARISON:  Chest radiograph 11/04/2015.  Chest CT 11/09/2015. FINDINGS: Right upper extremity PICC line is present with tip terminating in the superior vena cava. Multiple monitoring leads overlie the patient. Stable enlarged cardiac mediastinal contours. Slight improvement in previously described irregular consolidation right upper lobe. Remainder of the lungs are clear. No pleural effusion or pneumothorax. IMPRESSION: Suggestion of slight interval improvement right upper lobe consolidation, potentially represent pneumonia. Followup PA and lateral chest X-ray is recommended in 3-4 weeks following trial of antibiotic therapy to ensure resolution and exclude underlying malignancy. Electronically Signed   By: Annia Belt M.D.   On: 11/17/2015 09:15   Mr Card Morphology Wo/w Cm  11/09/2015  CLINICAL DATA:  Nonischemic cardiomyopathy EXAM: CARDIAC MRI TECHNIQUE: The patient was scanned on a 1.5 Tesla GE magnet. A dedicated cardiac coil was used. Functional imaging was done using Fiesta sequences. 2,3, and 4 chamber views were done to assess for RWMA's. Modified Simpson's rule using a short axis stack was used to calculate an ejection fraction on a dedicated work [employer]. The patient received 25 cc of Multihance. After 10 minutes inversion recovery sequences were used to assess for infiltration and scar tissue. CONTRAST:  25 cc  Multihance FINDINGS: Limited images of the lung fields showed no significant abnormalities. Markedly dilated left ventricle with relatively thin walls. Diffuse severe hypokinesis, EF 8%. There was a small LV thrombus adherent to the mid anterior wall. The right ventricle was mildly dilated with moderately decreased systolic function. There was mild to moderate central mitral regurgitation. Trileaflet aortic valve with no significant stenosis or regurgitation. Moderate left atrial enlargement. Mild right atrial enlargement. On delayed enhancement imaging, there was patchy late gadolinium enhancement (LGE) in the inferior wall. There was a small area of full thickness LGE in the basal inferior wall. There was a small area of near-full thickness LGE in the mid inferior wall. Finally, there were a couple of small areas of < 25% thickness subendocardial LGE in the apical inferior wall. MEASUREMENTS: LVEDV 533 mL LV SV 42 mL LV EF 8% IMPRESSION: 1. Markedly dilated left ventricle with very severe diffuse hypokinesis, EF 8%. 2.  Mildly dilated RV with moderately decreased systolic function. 3.  Small LV mid anterior wall thrombus. 4. Patchy inferior LGE pattern as described above. This is of uncertain significance, could be related to prior myocarditis. Dalton Mclean Electronically Signed   By: [name] M.D.   On: 11/09/2015 00:43    PHYSICAL EXAM CVP ~3 General: NAD. Sitting in chair.  Neck: JVP flat, no thyromegaly or thyroid nodule.  Lungs: Clear to auscultation bilaterally with normal respiratory effort. CV: Lateral PMI.  Heart  regular S1/S2, loud s3 no murmur.  No edema.   Abdomen: Soft, nontender, no hepatosplenomegaly, no distention.  Neurologic: Alert and oriented x 3.  Psych: Normal affect. Extremities: No clubbing or cyanosis.   TELEMETRY: Reviewed telemetry pt in NSR 90-100s  ASSESSMENT AND PLAN: 60 yo with history of nonischemic cardiomyopathy and apparently a prior LV thrombus presented  with acute/chronic systolic CHF. He is followed by a cardiologist in [place].  1. Acute/chronic systolic CHF -> cardiogenic shock:  Nonischemic cardiomyopathy.  Cause uncertain => not a heavy drinker, had one sister with what sounds like sudden death.  Possible viral myocarditis. EF has been very low per his report, do not have echo done yet here.  Had chest pain and elevated TnI at admission, so had LHC/RHC 4/24.  No coronary disease noted, no evidence for cardioembolism from LV thrombus either.  RHC showed markedly elevated filling pressures and low cardiac index at 1.39.  He was started on milrinone and IV Lasix.  Cardiac MRI was done, showing severe LV dilation with EF 8%, small LV thrombus, and mildly dilated RV with moderately decreased systolic function.  There was a patchy LGE pattern in the inferior wall, ?prior myocarditis.  HIV negative.  Milrinone restarted 4/28 now at 0.25. Norepinephrine begun at low dose for soft BP. Today CO-OX 63%  on milrinone + norepi.  - CVP 3, can continue to hold torsemide today but may need to restart soon. - Continue current milrinone and norepinephrine, - Continue current digoxin. Dig level 0.3   - Spiro on hold due to elevated K - Off lisinopril now with soft BP.   - M-spike noted on SPEP.  Serum immunofixation negative. Does not look like cardiac amyloidosis on MRI.  - He has a LBBB and no ICD (pending Medicaid).  -RHC with optimized hemodynamics. LVVAD planned for Monday. We discussed post-op course today.  2. LV thrombus: Small thrombus on MRI. Off  warfarin and cover with heparin gtt. Pharmacy adjusting  3. Anemia: unclear source. Today's hemoglobin is stable. Continue Protonix. EGD and Colonoscopy unremarkable.    Arvilla Meres MD  11/19/2015 11:48 AM

## 2015-11-20 ENCOUNTER — Inpatient Hospital Stay (HOSPITAL_COMMUNITY): Payer: Medicaid Other

## 2015-11-20 LAB — CBC
HCT: 32.1 % — ABNORMAL LOW (ref 39.0–52.0)
Hemoglobin: 10.4 g/dL — ABNORMAL LOW (ref 13.0–17.0)
MCH: 33.4 pg (ref 26.0–34.0)
MCHC: 32.4 g/dL (ref 30.0–36.0)
MCV: 103.2 fL — ABNORMAL HIGH (ref 78.0–100.0)
PLATELETS: 314 10*3/uL (ref 150–400)
RBC: 3.11 MIL/uL — ABNORMAL LOW (ref 4.22–5.81)
RDW: 13.7 % (ref 11.5–15.5)
WBC: 4 10*3/uL (ref 4.0–10.5)

## 2015-11-20 LAB — COMPREHENSIVE METABOLIC PANEL
ALT: 51 U/L (ref 17–63)
AST: 38 U/L (ref 15–41)
Albumin: 2.6 g/dL — ABNORMAL LOW (ref 3.5–5.0)
Alkaline Phosphatase: 87 U/L (ref 38–126)
Anion gap: 10 (ref 5–15)
BUN: 13 mg/dL (ref 6–20)
CO2: 24 mmol/L (ref 22–32)
Calcium: 9.2 mg/dL (ref 8.9–10.3)
Chloride: 101 mmol/L (ref 101–111)
Creatinine, Ser: 1.11 mg/dL (ref 0.61–1.24)
GFR calc Af Amer: >60 mL/min (ref >60)
GFR calc non Af Amer: >60 mL/min (ref >60)
Glucose, Bld: 190 mg/dL — ABNORMAL HIGH (ref 65–99)
Potassium: 4.4 mmol/L (ref 3.5–5.1)
Sodium: 135 mmol/L (ref 135–145)
Total Bilirubin: 1 mg/dL (ref 0.3–1.2)
Total Protein: 7.4 g/dL (ref 6.5–8.1)

## 2015-11-20 LAB — CARBOXYHEMOGLOBIN
Carboxyhemoglobin: 1.7 % — ABNORMAL HIGH (ref 0.5–1.5)
Carboxyhemoglobin: 1.8 % — ABNORMAL HIGH (ref 0.5–1.5)
METHEMOGLOBIN: 0.6 % (ref 0.0–1.5)
METHEMOGLOBIN: 0.6 % (ref 0.0–1.5)
O2 SAT: 54.8 %
O2 Saturation: 52.7 %
TOTAL HEMOGLOBIN: 11.3 g/dL — AB (ref 13.5–18.0)
Total hemoglobin: 11.4 g/dL — ABNORMAL LOW (ref 13.5–18.0)

## 2015-11-20 LAB — HEPARIN LEVEL (UNFRACTIONATED)
HEPARIN UNFRACTIONATED: 0.57 [IU]/mL (ref 0.30–0.70)
Heparin Unfractionated: 0.59 IU/mL (ref 0.30–0.70)

## 2015-11-20 LAB — PREPARE RBC (CROSSMATCH)

## 2015-11-20 MED ORDER — NOREPINEPHRINE BITARTRATE 1 MG/ML IV SOLN
0.0000 ug/min | INTRAVENOUS | Status: DC
  Filled 2015-11-20: qty 4

## 2015-11-20 MED ORDER — CHLORHEXIDINE GLUCONATE CLOTH 2 % EX PADS
6.0000 | MEDICATED_PAD | Freq: Once | CUTANEOUS | Status: AC
Start: 2015-11-21 — End: 2015-11-21
  Administered 2015-11-21: 6 via TOPICAL

## 2015-11-20 MED ORDER — DEXTROSE 5 % IV SOLN
1.5000 g | INTRAVENOUS | Status: DC
  Administered 2015-11-21: 1.5 g via INTRAVENOUS
  Administered 2015-11-21: .75 g via INTRAVENOUS
  Filled 2015-11-20: qty 1.5

## 2015-11-20 MED ORDER — BISACODYL 5 MG PO TBEC
5.0000 mg | DELAYED_RELEASE_TABLET | Freq: Once | ORAL | Status: AC
  Administered 2015-11-20: 5 mg via ORAL
  Filled 2015-11-20: qty 1

## 2015-11-20 MED ORDER — ALPRAZOLAM 0.25 MG PO TABS: 0.2500 mg | ORAL_TABLET | ORAL | Status: DC | PRN

## 2015-11-20 MED ORDER — DOPAMINE-DEXTROSE 3.2-5 MG/ML-% IV SOLN
0.0000 ug/kg/min | INTRAVENOUS | Status: DC
  Filled 2015-11-20: qty 250

## 2015-11-20 MED ORDER — MUPIROCIN 2 % EX OINT
1.0000 "application " | TOPICAL_OINTMENT | Freq: Two times a day (BID) | CUTANEOUS | Status: AC
  Administered 2015-11-20 (×2): 1 via NASAL
  Filled 2015-11-20: qty 22

## 2015-11-20 MED ORDER — MILRINONE LACTATE IN DEXTROSE 20-5 MG/100ML-% IV SOLN
0.3000 ug/kg/min | INTRAVENOUS | Status: AC
  Administered 2015-11-21: 0.375 ug/kg/min via INTRAVENOUS
  Filled 2015-11-20: qty 100

## 2015-11-20 MED ORDER — POTASSIUM CHLORIDE 2 MEQ/ML IV SOLN
80.0000 meq | INTRAVENOUS | Status: DC
  Filled 2015-11-20: qty 40

## 2015-11-20 MED ORDER — PHENYLEPHRINE HCL 10 MG/ML IJ SOLN
0.0000 ug/min | INTRAVENOUS | Status: DC
  Filled 2015-11-20: qty 2

## 2015-11-20 MED ORDER — SODIUM CHLORIDE 0.9 % IV SOLN
INTRAVENOUS | Status: DC
  Administered 2015-11-21: 69.8 mL/h via INTRAVENOUS
  Filled 2015-11-20: qty 40

## 2015-11-20 MED ORDER — EPINEPHRINE HCL 1 MG/ML IJ SOLN
0.0000 ug/min | INTRAVENOUS | Status: DC
  Administered 2015-11-21: 2 ug/min via INTRAVENOUS
  Filled 2015-11-20: qty 4

## 2015-11-20 MED ORDER — VASOPRESSIN 20 UNIT/ML IV SOLN
0.0400 [IU]/min | INTRAVENOUS | Status: DC
  Filled 2015-11-20: qty 2

## 2015-11-20 MED ORDER — DIAZEPAM 2 MG PO TABS
2.0000 mg | ORAL_TABLET | Freq: Once | ORAL | Status: AC
  Administered 2015-11-21: 2 mg via ORAL
  Filled 2015-11-20: qty 1

## 2015-11-20 MED ORDER — FLUCONAZOLE IN SODIUM CHLORIDE 400-0.9 MG/200ML-% IV SOLN
400.0000 mg | INTRAVENOUS | Status: DC
  Administered 2015-11-21: 400 mg via INTRAVENOUS
  Filled 2015-11-20: qty 200

## 2015-11-20 MED ORDER — VANCOMYCIN HCL 10 G IV SOLR
1250.0000 mg | INTRAVENOUS | Status: DC
  Administered 2015-11-21: 1250 mg via INTRAVENOUS
  Filled 2015-11-20: qty 1250

## 2015-11-20 MED ORDER — SODIUM CHLORIDE 0.9 % IV SOLN
600.0000 mg | INTRAVENOUS | Status: AC
  Administered 2015-11-21: 600 mg via INTRAVENOUS
  Filled 2015-11-20: qty 600

## 2015-11-20 MED ORDER — SODIUM CHLORIDE 0.9 % IV SOLN
INTRAVENOUS | Status: DC
  Administered 2015-11-21: 1.6 [IU]/h via INTRAVENOUS
  Filled 2015-11-20: qty 2.5

## 2015-11-20 MED ORDER — TEMAZEPAM 15 MG PO CAPS
15.0000 mg | ORAL_CAPSULE | Freq: Once | ORAL | Status: DC | PRN
  Filled 2015-11-20: qty 1

## 2015-11-20 MED ORDER — SODIUM CHLORIDE 0.9 % IV SOLN
INTRAVENOUS | Status: DC
  Filled 2015-11-20: qty 30

## 2015-11-20 MED ORDER — CHLORHEXIDINE GLUCONATE 4 % EX LIQD
60.0000 mL | Freq: Once | CUTANEOUS | Status: AC
Start: 2015-11-21 — End: 2015-11-21
  Administered 2015-11-21: 4 via TOPICAL
  Filled 2015-11-20: qty 60

## 2015-11-20 MED ORDER — CEFUROXIME SODIUM 750 MG IJ SOLR
750.0000 mg | INTRAMUSCULAR | Status: DC
  Filled 2015-11-20: qty 750

## 2015-11-20 MED ORDER — NITROGLYCERIN IN D5W 200-5 MCG/ML-% IV SOLN
0.0000 ug/min | INTRAVENOUS | Status: DC
  Filled 2015-11-20: qty 250

## 2015-11-20 MED ORDER — DOBUTAMINE IN D5W 4-5 MG/ML-% IV SOLN
2.0000 ug/kg/min | INTRAVENOUS | Status: DC
  Filled 2015-11-20: qty 250

## 2015-11-20 MED ORDER — CHLORHEXIDINE GLUCONATE 4 % EX LIQD
60.0000 mL | Freq: Once | CUTANEOUS | Status: AC
  Administered 2015-11-20: 4 via TOPICAL
  Filled 2015-11-20: qty 60

## 2015-11-20 MED ORDER — VANCOMYCIN HCL 1000 MG IV SOLR
1000.0000 mg | INTRAVENOUS | Status: DC
  Filled 2015-11-20: qty 1000

## 2015-11-20 MED ORDER — MAGNESIUM SULFATE 50 % IJ SOLN
40.0000 meq | INTRAMUSCULAR | Status: DC
  Filled 2015-11-20: qty 10

## 2015-11-20 MED ORDER — ALBUMIN HUMAN 25 % IV SOLN
12.5000 g | Freq: Once | INTRAVENOUS | Status: AC
  Administered 2015-11-20: 12.5 g via INTRAVENOUS
  Filled 2015-11-20: qty 50

## 2015-11-20 MED ORDER — CHLORHEXIDINE GLUCONATE 0.12 % MT SOLN
15.0000 mL | Freq: Once | OROMUCOSAL | Status: AC
  Administered 2015-11-21: 15 mL via OROMUCOSAL
  Filled 2015-11-20: qty 15

## 2015-11-20 MED ORDER — DEXMEDETOMIDINE HCL IN NACL 400 MCG/100ML IV SOLN
0.1000 ug/kg/h | INTRAVENOUS | Status: DC
  Administered 2015-11-21: .3 ug/kg/h via INTRAVENOUS
  Filled 2015-11-20: qty 100

## 2015-11-20 NOTE — Anesthesia Preprocedure Evaluation (Addendum)
Anesthesia Evaluation  Patient identified by MRN, date of birth, ID band Patient awake    Reviewed: Allergy & Precautions, H&P , NPO status , Patient's Chart, lab work & pertinent test results  Airway Mallampati: II  TM Distance: >3 FB Neck ROM: Full    Dental no notable dental hx. (+) Teeth Intact, Dental Advisory Given   Pulmonary neg pulmonary ROS, former smoker,    Pulmonary exam normal breath sounds clear to auscultation       Cardiovascular hypertension, Pt. on medications and Pt. on home beta blockers +CHF   Rhythm:Regular Rate:Normal     Neuro/Psych negative neurological ROS  negative psych ROS   GI/Hepatic negative GI ROS, Neg liver ROS,   Endo/Other  negative endocrine ROS  Renal/GU negative Renal ROS  negative genitourinary   Musculoskeletal   Abdominal   Peds  Hematology negative hematology ROS (+)   Anesthesia Other Findings   Reproductive/Obstetrics negative OB ROS                            Anesthesia Physical Anesthesia Plan  ASA: IV  Anesthesia Plan: General   Post-op Pain Management:    Induction: Intravenous  Airway Management Planned: Oral ETT  Additional Equipment: Arterial line, CVP, PA Cath, TEE and Ultrasound Guidance Line Placement  Intra-op Plan:   Post-operative Plan: Post-operative intubation/ventilation  Informed Consent: I have reviewed the patients History and Physical, chart, labs and discussed the procedure including the risks, benefits and alternatives for the proposed anesthesia with the patient or authorized representative who has indicated his/her understanding and acceptance.   Dental advisory given  Plan Discussed with: CRNA  Anesthesia Plan Comments:         Anesthesia Quick Evaluation

## 2015-11-20 NOTE — Progress Notes (Signed)
Patient ID: Matthew Vaughan, male   DOB: [birth year], 60 y.o.   MRN: [id number]   SUBJECTIVE:   Feels good. RHC yesterday on milrinone and norepi shows optimized hemodynamics.  Remains on milrinone 0.375, norepi 3. CVP 4. Denies SOB. Ambulating. K and mag supped for NSVT last night. Co-ox 53%    Cardiac MRI (4/25): Severe LV dilation with EF 8%, small thrombus on mid anterior wall, mildly dilated RV with moderately decreased systolic function.  Patchy LGE pattern in inferior wall, ?prior myocarditis.   Scheduled Meds: . chlorhexidine  60 mL Topical Once   And  . [START ON 11/21/2015] chlorhexidine  60 mL Topical Once  . [START ON 11/21/2015] chlorhexidine  15 mL Mouth/Throat Once  . [START ON 11/21/2015] Chlorhexidine Gluconate Cloth  6 each Topical Once  . [START ON 11/21/2015] diazepam  2 mg Oral Once  . digoxin  0.125 mg Oral Daily  . feeding supplement (ENSURE ENLIVE)  237 mL Oral BID BM  . mupirocin ointment  1 application Nasal BID  . sodium chloride flush  10-40 mL Intracatheter Q12H  . sodium chloride flush  3 mL Intravenous Q12H  . spironolactone  12.5 mg Oral Daily   Continuous Infusions: . sodium chloride Stopped (11/19/15 0900)  . heparin 1,450 Units/hr (11/20/15 0018)  . milrinone 0.375 mcg/kg/min (11/20/15 0108)  . norepinephrine (LEVOPHED) Adult infusion 3 mcg/min (11/19/15 1900)   PRN Meds:.sodium chloride, sodium chloride, acetaminophen, acetaminophen, ALPRAZolam, nitroGLYCERIN, ondansetron (ZOFRAN) IV, sodium chloride flush, temazepam   Filed Vitals:   11/20/15 0446 11/20/15 0800 11/20/15 0827 11/20/15 1207  BP: 95/73 96/75  106/84  Pulse: 102  101   Temp: 98.1 F (36.7 C) 98.1 F (36.7 C)  97.6 F (36.4 C)  TempSrc: Oral Oral  Oral  Resp:  14  15  Height:      Weight: 72.6 kg (160 lb 0.9 oz)     SpO2: 94% 95%      Intake/Output Summary (Last 24 hours) at 11/20/15 1229 Last data filed at 11/20/15 1207  Gross per 24 hour  Intake 1429.85 ml  Output   2025 ml   Net -595.15 ml    LABS: Basic Metabolic Panel:  Recent Labs  88/91/69 0340 11/19/15 1417 11/20/15 0529  NA 134*  --  135  K 4.1  --  4.4  CL 103  --  101  CO2 23  --  24  GLUCOSE 175*  --  190*  BUN 19  --  13  CREATININE 1.04  --  1.11  CALCIUM 8.8*  --  9.2  MG  --  1.6*  --    Liver Function Tests:  Recent Labs  11/20/15 0529  AST 38  ALT 51  ALKPHOS 87  BILITOT 1.0  PROT 7.4  ALBUMIN 2.6*   No results for input(s): LIPASE, AMYLASE in the last 72 hours. CBC:  Recent Labs  11/19/15 0340 11/20/15 0529  WBC 5.0 4.0  HGB 10.5* 10.4*  HCT 32.1* 32.1*  MCV 104.9* 103.2*  PLT 297 314   Cardiac Enzymes: No results for input(s): CKTOTAL, CKMB, CKMBINDEX, TROPONINI in the last 72 hours. BNP: Invalid input(s): POCBNP D-Dimer: No results for input(s): DDIMER in the last 72 hours. Hemoglobin A1C: No results for input(s): HGBA1C in the last 72 hours. Fasting Lipid Panel: No results for input(s): CHOL, HDL, LDLCALC, TRIG, CHOLHDL, LDLDIRECT in the last 72 hours. Thyroid Function Tests: No results for input(s): TSH, T4TOTAL, T3FREE, THYROIDAB in the last 72  hours.  Invalid input(s): FREET3 Anemia Panel: No results for input(s): VITAMINB12, FOLATE, FERRITIN, TIBC, IRON, RETICCTPCT in the last 72 hours.  RADIOLOGY: Ct Abdomen Pelvis Wo Contrast  11/09/2015  CLINICAL DATA:  Preoperative evaluation, prior to surgical treatment of nonischemic cardiomyopathy and left ventricular thrombus. Initial encounter. EXAM: CT CHEST, ABDOMEN AND PELVIS WITHOUT CONTRAST TECHNIQUE: Multidetector CT imaging of the chest, abdomen and pelvis was performed following the standard protocol without IV contrast. COMPARISON:  None. FINDINGS: CT CHEST Patchy peripheral opacities within the right upper and middle lobes likely reflect scarring, though mild infection could have a similar appearance. The lungs are otherwise grossly clear. No pleural effusion or pneumothorax is seen. No masses  are identified. Minimal blebs are noted at the lung apices. The heart is mildly enlarged, with left ventricular enlargement. A right PICC is noted ending about the proximal SVC. No pericardial effusion is identified. The great vessels are grossly unremarkable in appearance. No mediastinal lymphadenopathy is seen. The visualized portions of thyroid gland are unremarkable. No axillary lymphadenopathy is seen. No acute osseous abnormalities are identified. CT ABDOMEN AND PELVIS The liver and spleen are unremarkable in appearance. The gallbladder is within normal limits. The pancreas and adrenal glands are unremarkable. The kidneys are unremarkable in appearance. There is no evidence of hydronephrosis. No renal or ureteral stones are seen. No perinephric stranding is appreciated. No free fluid is identified. The small bowel is unremarkable in appearance. The stomach is within normal limits. No acute vascular abnormalities are seen. The appendix is normal in caliber, without evidence of appendicitis. The colon is grossly unremarkable in appearance. The bladder is mildly distended and grossly remarkable. The prostate remains normal in size. No inguinal lymphadenopathy is seen. No acute osseous abnormalities are identified. IMPRESSION: 1. Patchy peripheral opacities within the right upper and middle lobes likely reflect scarring, though mild infection could have a similar appearance. 2. Mild cardiomegaly, with left ventricular enlargement. 3. No acute abnormality seen within the abdomen or pelvis. Electronically Signed   By: Roanna Raider M.D.   On: 11/09/2015 20:00   Dg Chest 2 View  11/04/2015  CLINICAL DATA:  Chest pain and shortness of breath for 1 day EXAM: CHEST  2 VIEW COMPARISON:  None. FINDINGS: Cardiac enlargement without vascular congestion. Focal area of nodular consolidation in the right upper lung probably represents focal pneumonia. Soft tissue lesion is not excluded. Followup PA and lateral chest X-ray  is recommended in 3-4 weeks following trial of antibiotic therapy to ensure resolution and exclude underlying malignancy. Left lung is clear. No blunting of costophrenic angles. No pneumothorax. Mediastinal contours appear intact. Degenerative changes in the spine and shoulders. IMPRESSION: Cardiac enlargement. Focal nodular consolidation at the right upper lung likely represent pneumonia. Followup PA and lateral chest X-ray is recommended in 3-4 weeks following trial of antibiotic therapy to ensure resolution and exclude underlying malignancy. Electronically Signed   By: Burman Nieves M.D.   On: 11/04/2015 23:00   Ct Chest Wo Contrast  11/09/2015  CLINICAL DATA:  Preoperative evaluation, prior to surgical treatment of nonischemic cardiomyopathy and left ventricular thrombus. Initial encounter. EXAM: CT CHEST, ABDOMEN AND PELVIS WITHOUT CONTRAST TECHNIQUE: Multidetector CT imaging of the chest, abdomen and pelvis was performed following the standard protocol without IV contrast. COMPARISON:  None. FINDINGS: CT CHEST Patchy peripheral opacities within the right upper and middle lobes likely reflect scarring, though mild infection could have a similar appearance. The lungs are otherwise grossly clear. No pleural effusion or pneumothorax is  seen. No masses are identified. Minimal blebs are noted at the lung apices. The heart is mildly enlarged, with left ventricular enlargement. A right PICC is noted ending about the proximal SVC. No pericardial effusion is identified. The great vessels are grossly unremarkable in appearance. No mediastinal lymphadenopathy is seen. The visualized portions of thyroid gland are unremarkable. No axillary lymphadenopathy is seen. No acute osseous abnormalities are identified. CT ABDOMEN AND PELVIS The liver and spleen are unremarkable in appearance. The gallbladder is within normal limits. The pancreas and adrenal glands are unremarkable. The kidneys are unremarkable in appearance.  There is no evidence of hydronephrosis. No renal or ureteral stones are seen. No perinephric stranding is appreciated. No free fluid is identified. The small bowel is unremarkable in appearance. The stomach is within normal limits. No acute vascular abnormalities are seen. The appendix is normal in caliber, without evidence of appendicitis. The colon is grossly unremarkable in appearance. The bladder is mildly distended and grossly remarkable. The prostate remains normal in size. No inguinal lymphadenopathy is seen. No acute osseous abnormalities are identified. IMPRESSION: 1. Patchy peripheral opacities within the right upper and middle lobes likely reflect scarring, though mild infection could have a similar appearance. 2. Mild cardiomegaly, with left ventricular enlargement. 3. No acute abnormality seen within the abdomen or pelvis. Electronically Signed   By: Roanna Raider M.D.   On: 11/09/2015 20:00   Dg Chest Port 1 View  11/20/2015  CLINICAL DATA:  Patient with history of congestive heart failure. EXAM: PORTABLE CHEST 1 VIEW COMPARISON:  Chest radiograph 11/17/2015. FINDINGS: Right upper extremity PICC line tip projects over the superior vena cava. Multiple monitoring leads overlie the patient. Stable marked cardiomegaly. Grossly unchanged right upper lobe consolidation. No pleural effusion or pneumothorax. IMPRESSION: Cardiomegaly. Grossly unchanged consolidative opacities right upper lobe. Followup PA and lateral chest X-ray is recommended in 3-4 weeks following trial of antibiotic therapy to ensure resolution and exclude underlying malignancy. Electronically Signed   By: Annia Belt M.D.   On: 11/20/2015 08:23   Dg Chest Port 1 View  11/17/2015  CLINICAL DATA:  Patient with history of shortness breath. EXAM: PORTABLE CHEST 1 VIEW COMPARISON:  Chest radiograph 11/04/2015.  Chest CT 11/09/2015. FINDINGS: Right upper extremity PICC line is present with tip terminating in the superior vena cava. Multiple  monitoring leads overlie the patient. Stable enlarged cardiac mediastinal contours. Slight improvement in previously described irregular consolidation right upper lobe. Remainder of the lungs are clear. No pleural effusion or pneumothorax. IMPRESSION: Suggestion of slight interval improvement right upper lobe consolidation, potentially represent pneumonia. Followup PA and lateral chest X-ray is recommended in 3-4 weeks following trial of antibiotic therapy to ensure resolution and exclude underlying malignancy. Electronically Signed   By: Annia Belt M.D.   On: 11/17/2015 09:15   Mr Card Morphology Wo/w Cm  11/09/2015  CLINICAL DATA:  Nonischemic cardiomyopathy EXAM: CARDIAC MRI TECHNIQUE: The patient was scanned on a 1.5 Tesla GE magnet. A dedicated cardiac coil was used. Functional imaging was done using Fiesta sequences. 2,3, and 4 chamber views were done to assess for RWMA's. Modified Simpson's rule using a short axis stack was used to calculate an ejection fraction on a dedicated work [employer]. The patient received 25 cc of Multihance. After 10 minutes inversion recovery sequences were used to assess for infiltration and scar tissue. CONTRAST:  25 cc Multihance FINDINGS: Limited images of the lung fields showed no significant abnormalities. Markedly dilated left ventricle with relatively  thin walls. Diffuse severe hypokinesis, EF 8%. There was a small LV thrombus adherent to the mid anterior wall. The right ventricle was mildly dilated with moderately decreased systolic function. There was mild to moderate central mitral regurgitation. Trileaflet aortic valve with no significant stenosis or regurgitation. Moderate left atrial enlargement. Mild right atrial enlargement. On delayed enhancement imaging, there was patchy late gadolinium enhancement (LGE) in the inferior wall. There was a small area of full thickness LGE in the basal inferior wall. There was a small area of near-full  thickness LGE in the mid inferior wall. Finally, there were a couple of small areas of < 25% thickness subendocardial LGE in the apical inferior wall. MEASUREMENTS: LVEDV 533 mL LV SV 42 mL LV EF 8% IMPRESSION: 1. Markedly dilated left ventricle with very severe diffuse hypokinesis, EF 8%. 2.  Mildly dilated RV with moderately decreased systolic function. 3.  Small LV mid anterior wall thrombus. 4. Patchy inferior LGE pattern as described above. This is of uncertain significance, could be related to prior myocarditis. Dalton Mclean Electronically Signed   By: [name] M.D.   On: 11/09/2015 00:43    PHYSICAL EXAM CVP ~4 General: NAD. Sitting in chair.  Neck: JVP flat, no thyromegaly or thyroid nodule.  Lungs: Clear to auscultation bilaterally with normal respiratory effort. CV: Lateral PMI.  Heart regular S1/S2, loud s3 no murmur.  No edema.   Abdomen: Soft, nontender, no hepatosplenomegaly, no distention.  Neurologic: Alert and oriented x 3.  Psych: Normal affect. Extremities: No clubbing or cyanosis.   TELEMETRY: Reviewed telemetry pt in NSR 90-100s  ASSESSMENT AND PLAN: 60 yo with history of nonischemic cardiomyopathy and apparently a prior LV thrombus presented with acute/chronic systolic CHF. He is followed by a cardiologist in [place].  1. Acute/chronic systolic CHF -> cardiogenic shock:  Nonischemic cardiomyopathy.  Cause uncertain => not a heavy drinker, had one sister with what sounds like sudden death.  Possible viral myocarditis. EF has been very low per his report, do not have echo done yet here.  Had chest pain and elevated TnI at admission, so had LHC/RHC 4/24.  No coronary disease noted, no evidence for cardioembolism from LV thrombus either.  RHC showed markedly elevated filling pressures and low cardiac index at 1.39.  He was started on milrinone and IV Lasix.  Cardiac MRI was done, showing severe LV dilation with EF 8%, small LV thrombus, and mildly dilated RV with moderately  decreased systolic function.  There was a patchy LGE pattern in the inferior wall, ?prior myocarditis.  HIV negative.  Milrinone restarted 4/28 now at 0.375. Norepinephrine begun at low dose for soft BP. Today CO-OX 53%  on milrinone + norepi.  - CVP 4, can continue to hold torsemide today but may need to restart soon. - Continue current milrinone. Co-ox dropping will increase norepinephrine to 5, - Continue current digoxin. Dig level 0.3   - Spiro on hold due to elevated K - Off lisinopril now with soft BP.   - M-spike noted on SPEP.  Serum immunofixation negative. Does not look like cardiac amyloidosis on MRI.  - He has a LBBB and no ICD (pending Medicaid).  -RHC with optimized hemodynamics. LVVAD planned for tomorrow. We discussed post-op course today.  2. LV thrombus: Small thrombus on MRI. Off  warfarin and cover with heparin gtt. Pharmacy adjusting  3. Anemia: unclear source. Today's hemoglobin is stable. Continue Protonix. EGD and Colonoscopy unremarkable.   He remains very tenuous despite dual inotropes.  Will increase norepi. LVAD tomorrow. Without mechanical support he will not last much longer.   Arvilla Meres MD  11/20/2015 12:29 PM

## 2015-11-20 NOTE — Progress Notes (Signed)
Subjective:  No problems. Went outside  Co-ox this am 53, down from 65 yesterday. Levophed increased to 5 mcg. CVP remains low at 3.  Objective: Vital signs in last 24 hours: Temp:  [97.6 F (36.4 C)-98.1 F (36.7 C)] 97.9 F (36.6 C) (05/07 1957) Pulse Rate:  [90-102] 98 (05/07 1957) Cardiac Rhythm:  [-] Normal sinus rhythm;Bundle branch block (05/07 0800) Resp:  [14-16] 16 (05/07 1646) BP: (95-107)/(73-86) 107/86 mmHg (05/07 1957) SpO2:  [94 %-100 %] 100 % (05/07 1957) Weight:  [72.6 kg (160 lb 0.9 oz)] 72.6 kg (160 lb 0.9 oz) (05/07 0446)  Hemodynamic parameters for last 24 hours: CVP:  [2 mmHg-4 mmHg] 3 mmHg  Intake/Output from previous day: 05/06 0701 - 05/07 0700 In: 1501 [P.O.:660; I.V.:841] Out: 1775 [Urine:1775] Intake/Output this shift:      Lab Results:  Recent Labs  11/19/15 0340 11/20/15 0529  WBC 5.0 4.0  HGB 10.5* 10.4*  HCT 32.1* 32.1*  PLT 297 314   BMET:  Recent Labs  11/19/15 0340 11/20/15 0529  NA 134* 135  K 4.1 4.4  CL 103 101  CO2 23 24  GLUCOSE 175* 190*  BUN 19 13  CREATININE 1.04 1.11  CALCIUM 8.8* 9.2    PT/INR:  Recent Labs  11/19/15 0340  LABPROT 16.3*  INR 1.30   ABG    Component Value Date/Time   PHART 7.480* 11/09/2015 0856   HCO3 23.9 11/18/2015 1311   HCO3 24.6* 11/18/2015 1311   TCO2 25 11/18/2015 1311   TCO2 26 11/18/2015 1311   ACIDBASEDEF 1.0 11/18/2015 1311   ACIDBASEDEF 1.0 11/18/2015 1311   O2SAT 52.7 11/20/2015 0855   CBG (last 3)  No results for input(s): GLUCAP in the last 72 hours.  Assessment/Plan:  Nonischemic cardiomyopathy with acute on chronic systolic heart failure on Milrinone and Levophed. Plan HeartMate 2 LVAD in the am.   Matthew Vaughan 11/20/2015

## 2015-11-20 NOTE — Progress Notes (Signed)
ANTICOAGULATION CONSULT NOTE - Follow Up Consult  Pharmacy Consult for Heparin Indication: LV thrombus  No Known Allergies  Patient Measurements: Height: 5\' 11"  (180.3 cm) Weight: 159 lb 2.8 oz (72.2 kg) IBW/kg (Calculated) : 75.3  Vital Signs: Temp: 97.6 F (36.4 C) (05/07 0021) Temp Source: Oral (05/07 0021) BP: 99/76 mmHg (05/07 0021) Pulse Rate: 102 (05/07 0021)  Labs:  Recent Labs  11/17/15 0400  11/18/15 0300  11/19/15 0340 11/19/15 0637 11/19/15 1719 11/20/15 0023  HGB  --   --  10.8*  --  10.5*  --   --   --   HCT 35.5*  --  33.7*  --  32.1*  --   --   --   PLT  --   --  307  --  297  --   --   --   LABPROT 16.1*  --  15.4*  --  16.3*  --   --   --   INR 1.27  --  1.21  --  1.30  --   --   --   HEPARINUNFRC  --   < > 0.21*  < >  --  0.24* 0.77* 0.57  CREATININE 0.93  --  1.00  --  1.04  --   --   --   < > = values in this interval not displayed.  Estimated Creatinine Clearance: 79.1 mL/min (by C-G formula based on Cr of 1.04).  Assessment: 60 yo M on warfarin PTA for hx LV thrombus, transitioned to IV heparin on admission pending cath. S/P cath 4/24, found to have NICM, warfarin resumed with heparin bridge. On 4/30, INR was 1.98 and heparin was stopped due to drop in H/H - repeat H/H was better. Patient is now anticipating LVAD on 5/8, so warfarin was held and heparin bridge restarted 5/1.  Heparin level is now 0.57 after most rate decrease to 1450 units/hr. No issues with infusion.   Goal of Therapy:  Heparin level 0.3-0.7 units/ml Monitor platelets by anticoagulation protocol: Yes   Plan:  1.  Continue Heparin infusion at 1450 units/hr (14.5 ml/hr) 2.  Confirmatory HL in 6 - 8 hours 3.  Daily and CBC  Pollyann Samples, PharmD, BCPS 11/20/2015, 1:03 AM Pager: [phone]

## 2015-11-20 NOTE — Progress Notes (Signed)
ANTICOAGULATION CONSULT NOTE - Follow Up Consult  Pharmacy Consult for Heparin Indication: LV thrombus  No Known Allergies  Patient Measurements: Height: 5\' 11"  (180.3 cm) Weight: 160 lb 0.9 oz (72.6 kg) IBW/kg (Calculated) : 75.3  Vital Signs: Temp: 97.6 F (36.4 C) (05/07 1207) Temp Source: Oral (05/07 1207) BP: 106/84 mmHg (05/07 1207) Pulse Rate: 101 (05/07 0827)  Labs:  Recent Labs  11/18/15 0300  11/19/15 0340  11/19/15 1719 11/20/15 0023 11/20/15 0529 11/20/15 0905  HGB 10.8*  --  10.5*  --   --   --  10.4*  --   HCT 33.7*  --  32.1*  --   --   --  32.1*  --   PLT 307  --  297  --   --   --  314  --   LABPROT 15.4*  --  16.3*  --   --   --   --   --   INR 1.21  --  1.30  --   --   --   --   --   HEPARINUNFRC 0.21*  < >  --   < > 0.77* 0.57  --  0.59  CREATININE 1.00  --  1.04  --   --   --  1.11  --   < > = values in this interval not displayed.  Estimated Creatinine Clearance: 74.5 mL/min (by C-G formula based on Cr of 1.11).  Assessment: 60 yo M on warfarin PTA for hx LV thrombus, transitioned to IV heparin on admission pending cath. S/P cath 4/24, found to have NICM, warfarin resumed with heparin bridge. On 4/30, INR was 1.98 and heparin was stopped due to drop in H/H - repeat H/H was better. Patient was restarted on heparin on 5/1 in anticipation of LVAD on 5/8.  Heparin level remains therapeutic at 0.59 on 1450 units/hr. No issues with infusion or reports of bleeding. Hgb 10.4, Plt 314, both stable.  Goal of Therapy:  Heparin level 0.3-0.7 units/ml Monitor platelets by anticoagulation protocol: Yes   Plan:  --Continue Heparin infusion at 1450 units/hr --Daily HL/CBC --F/U after LVAD on 5/8 --F/U resumption of oral anticoagulation when able  Arcola Jansky, PharmD Clinical Pharmacy Resident Pager: [phone]  11/20/2015, 2:31 PM

## 2015-11-21 ENCOUNTER — Inpatient Hospital Stay (HOSPITAL_COMMUNITY): Payer: Medicaid Other | Admitting: Certified Registered Nurse Anesthetist

## 2015-11-21 ENCOUNTER — Encounter (HOSPITAL_COMMUNITY)
Admission: EM | Disposition: A | Discharge status: Home Health | Payer: Self-pay | Source: Home / Self Care | Attending: Cardiovascular Disease

## 2015-11-21 ENCOUNTER — Inpatient Hospital Stay (HOSPITAL_COMMUNITY): Payer: Medicaid Other

## 2015-11-21 ENCOUNTER — Encounter (HOSPITAL_COMMUNITY): Payer: Self-pay | Admitting: Critical Care Medicine

## 2015-11-21 DIAGNOSIS — Z95811 Presence of heart assist device: Secondary | ICD-10-CM

## 2015-11-21 DIAGNOSIS — I509 Heart failure, unspecified: Secondary | ICD-10-CM

## 2015-11-21 HISTORY — PX: TEE WITHOUT CARDIOVERSION: SHX5443

## 2015-11-21 HISTORY — PX: INSERTION OF IMPLANTABLE LEFT VENTRICULAR ASSIST DEVICE: SHX5866

## 2015-11-21 LAB — POCT I-STAT 3, ART BLOOD GAS (G3+)
ACID-BASE EXCESS: 2 mmol/L (ref 0.0–2.0)
ACID-BASE EXCESS: 2 mmol/L (ref 0.0–2.0)
Acid-base deficit: 3 mmol/L — ABNORMAL HIGH (ref 0.0–2.0)
Acid-base deficit: 2 mmol/L (ref 0.0–2.0)
Acid-base deficit: 2 mmol/L (ref 0.0–2.0)
BICARBONATE: 25.6 meq/L — AB (ref 20.0–24.0)
BICARBONATE: 27.3 meq/L — AB (ref 20.0–24.0)
Bicarbonate: 21.7 mEq/L (ref 20.0–24.0)
Bicarbonate: 27.8 mEq/L — ABNORMAL HIGH (ref 20.0–24.0)
Bicarbonate: 24.2 mEq/L — ABNORMAL HIGH (ref 20.0–24.0)
Bicarbonate: 22.5 mEq/L (ref 20.0–24.0)
O2 SAT: 100 %
O2 SAT: 100 %
O2 SAT: 100 %
O2 SAT: 98 %
O2 Saturation: 97 %
O2 Saturation: 100 %
PCO2 ART: 44.7 mmHg (ref 35.0–45.0)
PCO2 ART: 36.1 mmHg (ref 35.0–45.0)
PO2 ART: 487 mmHg — AB (ref 80.0–100.0)
PO2 ART: 437 mmHg — AB (ref 80.0–100.0)
PO2 ART: 164 mmHg — AB (ref 80.0–100.0)
Patient temperature: 41
Patient temperature: 36.6
TCO2: 23 mmol/L (ref 0–100)
TCO2: 27 mmol/L (ref 0–100)
TCO2: 29 mmol/L (ref 0–100)
TCO2: 29 mmol/L (ref 0–100)
TCO2: 26 mmol/L (ref 0–100)
TCO2: 24 mmol/L (ref 0–100)
pCO2 arterial: 36.1 mmHg (ref 35.0–45.0)
pCO2 arterial: 44.8 mmHg (ref 35.0–45.0)
pCO2 arterial: 47.8 mmHg — ABNORMAL HIGH (ref 35.0–45.0)
pCO2 arterial: 55.5 mmHg — ABNORMAL HIGH (ref 35.0–45.0)
pH, Arterial: 7.388 (ref 7.350–7.450)
pH, Arterial: 7.365 (ref 7.350–7.450)
pH, Arterial: 7.393 (ref 7.350–7.450)
pH, Arterial: 7.372 (ref 7.350–7.450)
pH, Arterial: 7.266 — ABNORMAL LOW (ref 7.350–7.450)
pH, Arterial: 7.401 (ref 7.350–7.450)
pO2, Arterial: 95 mmHg (ref 80.0–100.0)
pO2, Arterial: 265 mmHg — ABNORMAL HIGH (ref 80.0–100.0)
pO2, Arterial: 147 mmHg — ABNORMAL HIGH (ref 80.0–100.0)

## 2015-11-21 LAB — URINALYSIS, ROUTINE W REFLEX MICROSCOPIC
Bilirubin Urine: NEGATIVE
Glucose, UA: NEGATIVE mg/dL
Ketones, ur: NEGATIVE mg/dL
Nitrite: NEGATIVE
Protein, ur: NEGATIVE mg/dL
Specific Gravity, Urine: 1.006 (ref 1.005–1.030)
pH: 6 (ref 5.0–8.0)

## 2015-11-21 LAB — POCT I-STAT EG7
ACID-BASE DEFICIT: 4 mmol/L — AB (ref 0.0–2.0)
BICARBONATE: 21.7 meq/L (ref 20.0–24.0)
Calcium, Ion: 1.19 mmol/L (ref 1.12–1.23)
HCT: 30 % — ABNORMAL LOW (ref 39.0–52.0)
HEMOGLOBIN: 10.2 g/dL — AB (ref 13.0–17.0)
O2 SAT: 76 %
PCO2 VEN: 41.6 mmHg — AB (ref 45.0–50.0)
PH VEN: 7.325 — AB (ref 7.250–7.300)
PO2 VEN: 44 mmHg (ref 31.0–45.0)
Potassium: 3.2 mmol/L — ABNORMAL LOW (ref 3.5–5.1)
Sodium: 143 mmol/L (ref 135–145)
TCO2: 23 mmol/L (ref 0–100)

## 2015-11-21 LAB — POCT I-STAT, CHEM 8
BUN: 11 mg/dL (ref 6–20)
BUN: 13 mg/dL (ref 6–20)
BUN: 15 mg/dL (ref 6–20)
BUN: 12 mg/dL (ref 6–20)
CALCIUM ION: 1.08 mmol/L — AB (ref 1.12–1.23)
CALCIUM ION: 1.23 mmol/L (ref 1.12–1.23)
CHLORIDE: 103 mmol/L (ref 101–111)
CREATININE: 0.7 mg/dL (ref 0.61–1.24)
CREATININE: 0.7 mg/dL (ref 0.61–1.24)
CREATININE: 0.8 mg/dL (ref 0.61–1.24)
Calcium, Ion: 1.26 mmol/L — ABNORMAL HIGH (ref 1.12–1.23)
Calcium, Ion: 1.24 mmol/L — ABNORMAL HIGH (ref 1.12–1.23)
Chloride: 104 mmol/L (ref 101–111)
Chloride: 102 mmol/L (ref 101–111)
Chloride: 107 mmol/L (ref 101–111)
Creatinine, Ser: 1 mg/dL (ref 0.61–1.24)
GLUCOSE: 123 mg/dL — AB (ref 65–99)
GLUCOSE: 130 mg/dL — AB (ref 65–99)
GLUCOSE: 174 mg/dL — AB (ref 65–99)
Glucose, Bld: 138 mg/dL — ABNORMAL HIGH (ref 65–99)
HCT: 30 % — ABNORMAL LOW (ref 39.0–52.0)
HCT: 28 % — ABNORMAL LOW (ref 39.0–52.0)
HCT: 37 % — ABNORMAL LOW (ref 39.0–52.0)
HEMATOCRIT: 33 % — AB (ref 39.0–52.0)
Hemoglobin: 10.2 g/dL — ABNORMAL LOW (ref 13.0–17.0)
Hemoglobin: 11.2 g/dL — ABNORMAL LOW (ref 13.0–17.0)
Hemoglobin: 9.5 g/dL — ABNORMAL LOW (ref 13.0–17.0)
Hemoglobin: 12.6 g/dL — ABNORMAL LOW (ref 13.0–17.0)
POTASSIUM: 3.8 mmol/L (ref 3.5–5.1)
POTASSIUM: 4.1 mmol/L (ref 3.5–5.1)
Potassium: 3.7 mmol/L (ref 3.5–5.1)
Potassium: 5 mmol/L (ref 3.5–5.1)
Sodium: 139 mmol/L (ref 135–145)
Sodium: 140 mmol/L (ref 135–145)
Sodium: 140 mmol/L (ref 135–145)
Sodium: 141 mmol/L (ref 135–145)
TCO2: 28 mmol/L (ref 0–100)
TCO2: 28 mmol/L (ref 0–100)
TCO2: 27 mmol/L (ref 0–100)
TCO2: 23 mmol/L (ref 0–100)

## 2015-11-21 LAB — GLUCOSE, CAPILLARY
GLUCOSE-CAPILLARY: 82 mg/dL (ref 65–99)
Glucose-Capillary: 80 mg/dL (ref 65–99)
Glucose-Capillary: 92 mg/dL (ref 65–99)
Glucose-Capillary: 129 mg/dL — ABNORMAL HIGH (ref 65–99)

## 2015-11-21 LAB — CARBOXYHEMOGLOBIN
CARBOXYHEMOGLOBIN: 1.6 % — AB (ref 0.5–1.5)
Carboxyhemoglobin: 1.4 % (ref 0.5–1.5)
METHEMOGLOBIN: 0.8 % (ref 0.0–1.5)
Methemoglobin: 1.3 % (ref 0.0–1.5)
O2 SAT: 62.6 %
O2 Saturation: 67 %
Total hemoglobin: 10.6 g/dL — ABNORMAL LOW (ref 13.5–18.0)
Total hemoglobin: 11.7 g/dL — ABNORMAL LOW (ref 13.5–18.0)

## 2015-11-21 LAB — CBC
HCT: 33.3 % — ABNORMAL LOW (ref 39.0–52.0)
HEMATOCRIT: 31.4 % — AB (ref 39.0–52.0)
HEMATOCRIT: 32.4 % — AB (ref 39.0–52.0)
HEMOGLOBIN: 10.2 g/dL — AB (ref 13.0–17.0)
HEMOGLOBIN: 11.1 g/dL — AB (ref 13.0–17.0)
Hemoglobin: 10.6 g/dL — ABNORMAL LOW (ref 13.0–17.0)
MCH: 33.3 pg (ref 26.0–34.0)
MCH: 32.6 pg (ref 26.0–34.0)
MCH: 33.3 pg (ref 26.0–34.0)
MCHC: 32.5 g/dL (ref 30.0–36.0)
MCHC: 32.7 g/dL (ref 30.0–36.0)
MCHC: 33.3 g/dL (ref 30.0–36.0)
MCV: 102.6 fL — ABNORMAL HIGH (ref 78.0–100.0)
MCV: 99.7 fL (ref 78.0–100.0)
MCV: 100 fL (ref 78.0–100.0)
PLATELETS: 155 10*3/uL (ref 150–400)
PLATELETS: 167 10*3/uL (ref 150–400)
Platelets: 325 10*3/uL (ref 150–400)
RBC: 3.06 MIL/uL — AB (ref 4.22–5.81)
RBC: 3.25 MIL/uL — ABNORMAL LOW (ref 4.22–5.81)
RBC: 3.33 MIL/uL — ABNORMAL LOW (ref 4.22–5.81)
RDW: 13.6 % (ref 11.5–15.5)
RDW: 15.6 % — AB (ref 11.5–15.5)
RDW: 16.3 % — ABNORMAL HIGH (ref 11.5–15.5)
WBC: 5.1 10*3/uL (ref 4.0–10.5)
WBC: 8.2 10*3/uL (ref 4.0–10.5)
WBC: 10.7 10*3/uL — ABNORMAL HIGH (ref 4.0–10.5)

## 2015-11-21 LAB — BASIC METABOLIC PANEL
Anion gap: 9 (ref 5–15)
Anion gap: 6 (ref 5–15)
BUN: 15 mg/dL (ref 6–20)
BUN: 10 mg/dL (ref 6–20)
CALCIUM: 8.6 mg/dL — AB (ref 8.9–10.3)
CO2: 23 mmol/L (ref 22–32)
CO2: 23 mmol/L (ref 22–32)
CREATININE: 1.03 mg/dL (ref 0.61–1.24)
Calcium: 9 mg/dL (ref 8.9–10.3)
Chloride: 100 mmol/L — ABNORMAL LOW (ref 101–111)
Chloride: 112 mmol/L — ABNORMAL HIGH (ref 101–111)
Creatinine, Ser: 1 mg/dL (ref 0.61–1.24)
GFR calc Af Amer: >60 mL/min (ref >60)
GFR calc Af Amer: >60 mL/min (ref >60)
GFR calc non Af Amer: >60 mL/min (ref >60)
Glucose, Bld: 221 mg/dL — ABNORMAL HIGH (ref 65–99)
Glucose, Bld: 92 mg/dL (ref 65–99)
Potassium: 4.1 mmol/L (ref 3.5–5.1)
Potassium: 4.5 mmol/L (ref 3.5–5.1)
Sodium: 132 mmol/L — ABNORMAL LOW (ref 135–145)
Sodium: 141 mmol/L (ref 135–145)

## 2015-11-21 LAB — POCT I-STAT 4, (NA,K, GLUC, HGB,HCT)
GLUCOSE: 118 mg/dL — AB (ref 65–99)
HEMATOCRIT: 35 % — AB (ref 39.0–52.0)
HEMOGLOBIN: 11.9 g/dL — AB (ref 13.0–17.0)
Potassium: 3.6 mmol/L (ref 3.5–5.1)
Sodium: 141 mmol/L (ref 135–145)

## 2015-11-21 LAB — HEMOGLOBIN AND HEMATOCRIT, BLOOD
HEMATOCRIT: 25.2 % — AB (ref 39.0–52.0)
Hemoglobin: 8.3 g/dL — ABNORMAL LOW (ref 13.0–17.0)

## 2015-11-21 LAB — APTT: aPTT: 93 seconds — ABNORMAL HIGH (ref 24–37)

## 2015-11-21 LAB — DIC (DISSEMINATED INTRAVASCULAR COAGULATION) PANEL
D DIMER QUANT: 1.28 ug{FEU}/mL — AB (ref 0.00–0.50)
FIBRINOGEN: 323 mg/dL (ref 204–475)
PLATELETS: 152 10*3/uL (ref 150–400)
SMEAR REVIEW: NONE SEEN

## 2015-11-21 LAB — CREATININE, SERUM
Creatinine, Ser: 1.19 mg/dL (ref 0.61–1.24)
GFR calc non Af Amer: >60 mL/min (ref >60)

## 2015-11-21 LAB — URINE MICROSCOPIC-ADD ON

## 2015-11-21 LAB — DIC (DISSEMINATED INTRAVASCULAR COAGULATION)PANEL
INR: 1.61 — ABNORMAL HIGH (ref 0.00–1.49)
Prothrombin Time: 19.2 seconds — ABNORMAL HIGH (ref 11.6–15.2)
aPTT: 37 seconds (ref 24–37)

## 2015-11-21 LAB — MAGNESIUM
MAGNESIUM: 1.6 mg/dL — AB (ref 1.7–2.4)
MAGNESIUM: 2.6 mg/dL — AB (ref 1.7–2.4)

## 2015-11-21 LAB — HEPARIN LEVEL (UNFRACTIONATED): HEPARIN UNFRACTIONATED: 0.42 [IU]/mL (ref 0.30–0.70)

## 2015-11-21 LAB — PLATELET COUNT: Platelets: 210 10*3/uL (ref 150–400)

## 2015-11-21 SURGERY — INSERTION OF IMPLANTABLE LEFT VENTRICULAR ASSIST DEVICE
Anesthesia: General | Site: Chest

## 2015-11-21 MED ORDER — FLUCONAZOLE IN SODIUM CHLORIDE 400-0.9 MG/200ML-% IV SOLN
400.0000 mg | Freq: Once | INTRAVENOUS | Status: AC
  Administered 2015-11-22: 400 mg via INTRAVENOUS
  Filled 2015-11-21: qty 200

## 2015-11-21 MED ORDER — FENTANYL CITRATE (PF) 250 MCG/5ML IJ SOLN
INTRAMUSCULAR | Status: AC
  Filled 2015-11-21: qty 25

## 2015-11-21 MED ORDER — LACTATED RINGERS IV SOLN
INTRAVENOUS | Status: DC
Start: 2015-11-21 — End: 2015-11-25
  Administered 2015-11-22: 20 mL via INTRAVENOUS

## 2015-11-21 MED ORDER — ALBUMIN HUMAN 5 % IV SOLN
250.0000 mL | INTRAVENOUS | Status: AC | PRN
Start: 2015-11-21 — End: 2015-11-22
  Administered 2015-11-21 – 2015-11-22 (×3): 250 mL via INTRAVENOUS
  Filled 2015-11-21: qty 250

## 2015-11-21 MED ORDER — INSULIN ASPART 100 UNIT/ML ~~LOC~~ SOLN
0.0000 [IU] | SUBCUTANEOUS | Status: DC
  Administered 2015-11-21: 2 [IU] via SUBCUTANEOUS

## 2015-11-21 MED ORDER — INSULIN REGULAR BOLUS VIA INFUSION
0.0000 [IU] | Freq: Three times a day (TID) | INTRAVENOUS | Status: DC
  Filled 2015-11-21: qty 10

## 2015-11-21 MED ORDER — PHENYLEPHRINE HCL 10 MG/ML IJ SOLN: 0.0000 ug/min | INTRAVENOUS | Status: DC

## 2015-11-21 MED ORDER — THROMBIN 20000 UNITS EX SOLR
OROMUCOSAL | Status: DC | PRN
  Administered 2015-11-21 (×4): 4 mL via TOPICAL

## 2015-11-21 MED ORDER — SODIUM CHLORIDE 0.9% FLUSH
3.0000 mL | Freq: Two times a day (BID) | INTRAVENOUS | Status: DC
  Administered 2015-11-22 – 2015-12-05 (×7): 3 mL via INTRAVENOUS

## 2015-11-21 MED ORDER — MIDAZOLAM HCL 10 MG/2ML IJ SOLN
INTRAMUSCULAR | Status: AC
  Filled 2015-11-21: qty 2

## 2015-11-21 MED ORDER — ASPIRIN 300 MG RE SUPP: 300.0000 mg | Freq: Every day | RECTAL | Status: DC

## 2015-11-21 MED ORDER — ETOMIDATE 2 MG/ML IV SOLN
INTRAVENOUS | Status: DC | PRN
  Administered 2015-11-21: 10 mg via INTRAVENOUS

## 2015-11-21 MED ORDER — NOREPINEPHRINE BITARTRATE 1 MG/ML IV SOLN
0.0000 ug/min | INTRAVENOUS | Status: DC
  Filled 2015-11-21: qty 4

## 2015-11-21 MED ORDER — POTASSIUM CHLORIDE 10 MEQ/50ML IV SOLN
10.0000 meq | INTRAVENOUS | Status: AC
  Administered 2015-11-21 (×3): 10 meq via INTRAVENOUS

## 2015-11-21 MED ORDER — VANCOMYCIN HCL IN DEXTROSE 1-5 GM/200ML-% IV SOLN
1000.0000 mg | Freq: Two times a day (BID) | INTRAVENOUS | Status: DC
  Filled 2015-11-21: qty 200

## 2015-11-21 MED ORDER — SODIUM CHLORIDE 0.45 % IV SOLN: INTRAVENOUS | Status: DC | PRN

## 2015-11-21 MED ORDER — ACETAMINOPHEN 160 MG/5ML PO SOLN: 650.0000 mg | Freq: Once | ORAL | Status: AC

## 2015-11-21 MED ORDER — SODIUM CHLORIDE 0.9 % IJ SOLN
INTRAMUSCULAR | Status: AC
  Filled 2015-11-21: qty 30

## 2015-11-21 MED ORDER — NOREPINEPHRINE BITARTRATE 1 MG/ML IV SOLN
0.0000 ug/min | INTRAVENOUS | Status: DC
  Administered 2015-11-21: 12 ug/min via INTRAVENOUS
  Filled 2015-11-21: qty 16

## 2015-11-21 MED ORDER — PROTAMINE SULFATE 10 MG/ML IV SOLN
INTRAVENOUS | Status: AC
  Filled 2015-11-21: qty 5

## 2015-11-21 MED ORDER — FENTANYL CITRATE (PF) 250 MCG/5ML IJ SOLN
INTRAMUSCULAR | Status: AC
  Filled 2015-11-21: qty 5

## 2015-11-21 MED ORDER — ACETAMINOPHEN 160 MG/5ML PO SOLN
1000.0000 mg | Freq: Four times a day (QID) | ORAL | Status: AC
  Administered 2015-11-22 (×2): 1000 mg
  Filled 2015-11-21 (×2): qty 40.6

## 2015-11-21 MED ORDER — ANTISEPTIC ORAL RINSE SOLUTION (CORINZ)
7.0000 mL | Freq: Four times a day (QID) | OROMUCOSAL | Status: DC
  Administered 2015-11-22 (×4): 7 mL via OROMUCOSAL

## 2015-11-21 MED ORDER — ACETAMINOPHEN 650 MG RE SUPP
650.0000 mg | Freq: Once | RECTAL | Status: AC
Start: 2015-11-21 — End: 2015-11-21
  Administered 2015-11-21: 650 mg via RECTAL

## 2015-11-21 MED ORDER — DEXTROSE 5 % IV SOLN
1.5000 g | Freq: Two times a day (BID) | INTRAVENOUS | Status: AC
  Administered 2015-11-21 – 2015-11-23 (×4): 1.5 g via INTRAVENOUS
  Filled 2015-11-21 (×4): qty 1.5

## 2015-11-21 MED ORDER — PROTAMINE SULFATE 10 MG/ML IV SOLN
INTRAVENOUS | Status: AC
  Filled 2015-11-21: qty 25

## 2015-11-21 MED ORDER — SODIUM CHLORIDE 0.9 % IJ SOLN
INTRAMUSCULAR | Status: AC
  Filled 2015-11-21: qty 10

## 2015-11-21 MED ORDER — MIDAZOLAM HCL 2 MG/2ML IJ SOLN
2.0000 mg | INTRAMUSCULAR | Status: DC | PRN
  Filled 2015-11-21: qty 2

## 2015-11-21 MED ORDER — MORPHINE SULFATE (PF) 2 MG/ML IV SOLN
1.0000 mg | INTRAVENOUS | Status: AC | PRN
  Administered 2015-11-21 – 2015-11-22 (×3): 4 mg via INTRAVENOUS
  Filled 2015-11-21 (×4): qty 2

## 2015-11-21 MED ORDER — VANCOMYCIN HCL 1000 MG IV SOLR
INTRAVENOUS | Status: AC
  Filled 2015-11-21: qty 1000

## 2015-11-21 MED ORDER — LACTATED RINGERS IV SOLN
INTRAVENOUS | Status: DC | PRN
  Administered 2015-11-21 (×2): via INTRAVENOUS

## 2015-11-21 MED ORDER — TRAMADOL HCL 50 MG PO TABS
50.0000 mg | ORAL_TABLET | ORAL | Status: DC | PRN
  Administered 2015-11-23 – 2015-12-02 (×11): 100 mg via ORAL
  Filled 2015-11-21 (×12): qty 2

## 2015-11-21 MED ORDER — SODIUM CHLORIDE 0.9% FLUSH
10.0000 mL | Freq: Two times a day (BID) | INTRAVENOUS | Status: DC
  Administered 2015-11-21 – 2015-11-24 (×4): 10 mL

## 2015-11-21 MED ORDER — SODIUM CHLORIDE 0.9 % IV SOLN
INTRAVENOUS | Status: DC | PRN
  Administered 2015-11-21: 3000 mL via INTRAMUSCULAR

## 2015-11-21 MED ORDER — LACTATED RINGERS IV SOLN
INTRAVENOUS | Status: DC | PRN
  Administered 2015-11-21: 07:00:00 via INTRAVENOUS

## 2015-11-21 MED ORDER — CHLORHEXIDINE GLUCONATE 0.12% ORAL RINSE (MEDLINE KIT)
15.0000 mL | Freq: Two times a day (BID) | OROMUCOSAL | Status: DC
Start: 2015-11-21 — End: 2015-11-22
  Administered 2015-11-21 – 2015-11-22 (×2): 15 mL via OROMUCOSAL

## 2015-11-21 MED ORDER — ARTIFICIAL TEARS OP OINT
TOPICAL_OINTMENT | OPHTHALMIC | Status: AC
  Filled 2015-11-21: qty 3.5

## 2015-11-21 MED ORDER — ROCURONIUM BROMIDE 50 MG/5ML IV SOLN
INTRAVENOUS | Status: AC
  Filled 2015-11-21: qty 1

## 2015-11-21 MED ORDER — NITROGLYCERIN IN D5W 200-5 MCG/ML-% IV SOLN: 0.0000 ug/min | INTRAVENOUS | Status: DC

## 2015-11-21 MED ORDER — DEXTROSE 5 % IV SOLN
0.0000 ug/min | INTRAVENOUS | Status: DC
  Administered 2015-11-21: 15 ug/min via INTRAVENOUS

## 2015-11-21 MED ORDER — LACTATED RINGERS IV SOLN: 500.0000 mL | Freq: Once | INTRAVENOUS | Status: DC | PRN

## 2015-11-21 MED ORDER — CHLORHEXIDINE GLUCONATE 0.12 % MT SOLN
15.0000 mL | OROMUCOSAL | Status: AC
  Administered 2015-11-21: 15 mL via OROMUCOSAL

## 2015-11-21 MED ORDER — THROMBIN 20000 UNITS EX SOLR
CUTANEOUS | Status: AC
  Filled 2015-11-21: qty 20000

## 2015-11-21 MED ORDER — DEXMEDETOMIDINE HCL IN NACL 400 MCG/100ML IV SOLN
0.4000 ug/kg/h | INTRAVENOUS | Status: DC
  Filled 2015-11-21: qty 100

## 2015-11-21 MED ORDER — VANCOMYCIN HCL 10 G IV SOLR
1250.0000 mg | Freq: Two times a day (BID) | INTRAVENOUS | Status: AC
  Administered 2015-11-21 – 2015-11-22 (×3): 1250 mg via INTRAVENOUS
  Filled 2015-11-21 (×3): qty 1250

## 2015-11-21 MED ORDER — SODIUM CHLORIDE 0.9 % IV SOLN: INTRAVENOUS | Status: DC

## 2015-11-21 MED ORDER — PANTOPRAZOLE SODIUM 40 MG PO TBEC
40.0000 mg | DELAYED_RELEASE_TABLET | Freq: Every day | ORAL | Status: DC
  Administered 2015-11-23 – 2015-12-06 (×14): 40 mg via ORAL
  Filled 2015-11-21 (×14): qty 1

## 2015-11-21 MED ORDER — DEXMEDETOMIDINE HCL IN NACL 200 MCG/50ML IV SOLN
0.1000 ug/kg/h | INTRAVENOUS | Status: DC
  Administered 2015-11-21 – 2015-11-22 (×3): 0.7 ug/kg/h via INTRAVENOUS
  Filled 2015-11-21 (×2): qty 50

## 2015-11-21 MED ORDER — VANCOMYCIN HCL 1000 MG IV SOLR
INTRAVENOUS | Status: DC | PRN
  Administered 2015-11-21: 1000 mg

## 2015-11-21 MED ORDER — HEPARIN SODIUM (PORCINE) 1000 UNIT/ML IJ SOLN
INTRAMUSCULAR | Status: DC | PRN
  Administered 2015-11-21: 37000 [IU] via INTRAVENOUS

## 2015-11-21 MED ORDER — PROPOFOL 10 MG/ML IV BOLUS
INTRAVENOUS | Status: DC | PRN
  Administered 2015-11-21: 20 mg via INTRAVENOUS

## 2015-11-21 MED ORDER — MIDAZOLAM HCL 10 MG/2ML IJ SOLN
INTRAMUSCULAR | Status: AC
Start: 2015-11-21 — End: 2015-11-21
  Filled 2015-11-21: qty 2

## 2015-11-21 MED ORDER — MIDAZOLAM HCL 5 MG/5ML IJ SOLN
INTRAMUSCULAR | Status: DC | PRN
  Administered 2015-11-21: 1 mg via INTRAVENOUS
  Administered 2015-11-21: 3 mg via INTRAVENOUS
  Administered 2015-11-21: 2 mg via INTRAVENOUS
  Administered 2015-11-21: 3 mg via INTRAVENOUS
  Administered 2015-11-21: 2 mg via INTRAVENOUS
  Administered 2015-11-21: 3 mg via INTRAVENOUS
  Administered 2015-11-21 (×2): 2 mg via INTRAVENOUS
  Administered 2015-11-21: 1 mg via INTRAVENOUS
  Administered 2015-11-21: 2 mg via INTRAVENOUS
  Administered 2015-11-21: 3 mg via INTRAVENOUS
  Administered 2015-11-21: 1 mg via INTRAVENOUS
  Administered 2015-11-21: 3 mg via INTRAVENOUS
  Administered 2015-11-21: 2 mg via INTRAVENOUS

## 2015-11-21 MED ORDER — ASPIRIN EC 325 MG PO TBEC
325.0000 mg | DELAYED_RELEASE_TABLET | Freq: Every day | ORAL | Status: DC
  Administered 2015-11-22 – 2015-11-25 (×4): 325 mg via ORAL
  Filled 2015-11-21 (×4): qty 1

## 2015-11-21 MED ORDER — ARTIFICIAL TEARS OP OINT
TOPICAL_OINTMENT | OPHTHALMIC | Status: DC | PRN
  Administered 2015-11-21: 1 via OPHTHALMIC

## 2015-11-21 MED ORDER — HEMOSTATIC AGENTS (NO CHARGE) OPTIME
TOPICAL | Status: DC | PRN
  Administered 2015-11-21: 1 via TOPICAL

## 2015-11-21 MED ORDER — DOCUSATE SODIUM 100 MG PO CAPS
200.0000 mg | ORAL_CAPSULE | Freq: Every day | ORAL | Status: DC
  Administered 2015-11-22 – 2015-11-30 (×9): 200 mg via ORAL
  Filled 2015-11-21 (×13): qty 2

## 2015-11-21 MED ORDER — PROTAMINE SULFATE 10 MG/ML IV SOLN
INTRAVENOUS | Status: DC | PRN
  Administered 2015-11-21: 370 mg via INTRAVENOUS

## 2015-11-21 MED ORDER — ASPIRIN 81 MG PO CHEW: 324.0000 mg | CHEWABLE_TABLET | Freq: Every day | ORAL | Status: DC

## 2015-11-21 MED ORDER — PHENYLEPHRINE HCL 10 MG/ML IJ SOLN
INTRAMUSCULAR | Status: DC | PRN
  Administered 2015-11-21 (×2): 40 ug via INTRAVENOUS

## 2015-11-21 MED ORDER — FENTANYL CITRATE (PF) 100 MCG/2ML IJ SOLN
INTRAMUSCULAR | Status: DC | PRN
  Administered 2015-11-21: 50 ug via INTRAVENOUS
  Administered 2015-11-21: 100 ug via INTRAVENOUS
  Administered 2015-11-21 (×3): 250 ug via INTRAVENOUS
  Administered 2015-11-21: 150 ug via INTRAVENOUS
  Administered 2015-11-21: 100 ug via INTRAVENOUS
  Administered 2015-11-21: 150 ug via INTRAVENOUS
  Administered 2015-11-21: 50 ug via INTRAVENOUS
  Administered 2015-11-21: 250 ug via INTRAVENOUS
  Administered 2015-11-21: 150 ug via INTRAVENOUS
  Administered 2015-11-21: 100 ug via INTRAVENOUS
  Administered 2015-11-21: 250 ug via INTRAVENOUS
  Administered 2015-11-21: 150 ug via INTRAVENOUS

## 2015-11-21 MED ORDER — PHENYLEPHRINE 40 MCG/ML (10ML) SYRINGE FOR IV PUSH (FOR BLOOD PRESSURE SUPPORT)
PREFILLED_SYRINGE | INTRAVENOUS | Status: AC
  Filled 2015-11-21: qty 10

## 2015-11-21 MED ORDER — METOCLOPRAMIDE HCL 5 MG/ML IJ SOLN
10.0000 mg | Freq: Four times a day (QID) | INTRAMUSCULAR | Status: AC
  Administered 2015-11-21 – 2015-11-23 (×7): 10 mg via INTRAVENOUS
  Filled 2015-11-21 (×7): qty 2

## 2015-11-21 MED ORDER — ONDANSETRON HCL 4 MG/2ML IJ SOLN
4.0000 mg | Freq: Four times a day (QID) | INTRAMUSCULAR | Status: DC | PRN
  Administered 2015-11-23 – 2015-12-01 (×4): 4 mg via INTRAVENOUS
  Filled 2015-11-21 (×4): qty 2

## 2015-11-21 MED ORDER — OXYCODONE HCL 5 MG PO TABS
5.0000 mg | ORAL_TABLET | ORAL | Status: DC | PRN
  Administered 2015-11-22: 10 mg via ORAL
  Administered 2015-11-22: 5 mg via ORAL
  Administered 2015-11-22 – 2015-11-24 (×7): 10 mg via ORAL
  Administered 2015-11-24 – 2015-11-25 (×5): 5 mg via ORAL
  Administered 2015-11-25 – 2015-12-06 (×39): 10 mg via ORAL
  Filled 2015-11-21 (×12): qty 2
  Filled 2015-11-21: qty 1
  Filled 2015-11-21: qty 2
  Filled 2015-11-21: qty 1
  Filled 2015-11-21 (×19): qty 2
  Filled 2015-11-21 (×2): qty 1
  Filled 2015-11-21 (×4): qty 2
  Filled 2015-11-21: qty 1
  Filled 2015-11-21 (×3): qty 2
  Filled 2015-11-21: qty 1
  Filled 2015-11-21 (×2): qty 2
  Filled 2015-11-21: qty 1
  Filled 2015-11-21 (×6): qty 2

## 2015-11-21 MED ORDER — ETOMIDATE 2 MG/ML IV SOLN
INTRAVENOUS | Status: AC
  Filled 2015-11-21: qty 10

## 2015-11-21 MED ORDER — DIPHENHYDRAMINE HCL 50 MG/ML IJ SOLN
INTRAMUSCULAR | Status: DC | PRN
  Administered 2015-11-21: 25 mg via INTRAVENOUS

## 2015-11-21 MED ORDER — SODIUM CHLORIDE 0.9% FLUSH: 3.0000 mL | INTRAVENOUS | Status: DC | PRN

## 2015-11-21 MED ORDER — MORPHINE SULFATE (PF) 2 MG/ML IV SOLN
2.0000 mg | INTRAVENOUS | Status: DC | PRN
  Administered 2015-11-22 (×3): 4 mg via INTRAVENOUS
  Administered 2015-11-22: 2 mg via INTRAVENOUS
  Administered 2015-11-22: 4 mg via INTRAVENOUS
  Administered 2015-11-22 (×2): 2 mg via INTRAVENOUS
  Administered 2015-11-23 – 2015-11-24 (×8): 4 mg via INTRAVENOUS
  Filled 2015-11-21 (×6): qty 2
  Filled 2015-11-21: qty 1
  Filled 2015-11-21 (×4): qty 2
  Filled 2015-11-21: qty 1
  Filled 2015-11-21 (×2): qty 2

## 2015-11-21 MED ORDER — PROPOFOL 10 MG/ML IV BOLUS
INTRAVENOUS | Status: AC
  Filled 2015-11-21: qty 20

## 2015-11-21 MED ORDER — EPINEPHRINE HCL 1 MG/ML IJ SOLN
INTRAMUSCULAR | Status: DC | PRN
  Administered 2015-11-21: .1 mg via INTRAVENOUS

## 2015-11-21 MED ORDER — ROCURONIUM BROMIDE 100 MG/10ML IV SOLN
INTRAVENOUS | Status: DC | PRN
  Administered 2015-11-21 (×4): 50 mg via INTRAVENOUS

## 2015-11-21 MED ORDER — SODIUM CHLORIDE 0.9% FLUSH: 10.0000 mL | INTRAVENOUS | Status: DC | PRN

## 2015-11-21 MED ORDER — 0.9 % SODIUM CHLORIDE (POUR BTL) OPTIME
TOPICAL | Status: DC | PRN
  Administered 2015-11-21: 5000 mL

## 2015-11-21 MED ORDER — MAGNESIUM SULFATE 4 GM/100ML IV SOLN
4.0000 g | Freq: Once | INTRAVENOUS | Status: AC
  Administered 2015-11-21: 4 g via INTRAVENOUS
  Filled 2015-11-21: qty 100

## 2015-11-21 MED ORDER — SODIUM CHLORIDE 0.9 % IV SOLN
250.0000 mL | INTRAVENOUS | Status: DC
  Administered 2015-11-24: 250 mL via INTRAVENOUS

## 2015-11-21 MED ORDER — ACETAMINOPHEN 500 MG PO TABS
1000.0000 mg | ORAL_TABLET | Freq: Four times a day (QID) | ORAL | Status: AC
  Administered 2015-11-22 – 2015-11-26 (×16): 1000 mg via ORAL
  Filled 2015-11-21 (×17): qty 2

## 2015-11-21 MED ORDER — BISACODYL 5 MG PO TBEC
10.0000 mg | DELAYED_RELEASE_TABLET | Freq: Every day | ORAL | Status: DC
  Administered 2015-11-22 – 2015-12-06 (×14): 10 mg via ORAL
  Filled 2015-11-21 (×14): qty 2

## 2015-11-21 MED ORDER — BISACODYL 10 MG RE SUPP: 10.0000 mg | Freq: Every day | RECTAL | Status: DC

## 2015-11-21 MED ORDER — FAMOTIDINE IN NACL 20-0.9 MG/50ML-% IV SOLN
20.0000 mg | Freq: Two times a day (BID) | INTRAVENOUS | Status: AC
  Administered 2015-11-21 – 2015-11-22 (×2): 20 mg via INTRAVENOUS
  Filled 2015-11-21: qty 50

## 2015-11-21 MED ORDER — LACTATED RINGERS IV SOLN
INTRAVENOUS | Status: DC
Start: 2015-11-21 — End: 2015-11-25
  Administered 2015-11-24: 08:00:00 via INTRAVENOUS

## 2015-11-21 MED ORDER — RIFAMPIN 300 MG PO CAPS
600.0000 mg | ORAL_CAPSULE | Freq: Once | ORAL | Status: AC
  Administered 2015-11-22: 600 mg via ORAL
  Filled 2015-11-21: qty 2

## 2015-11-21 MED FILL — Magnesium Sulfate Inj 50%: INTRAMUSCULAR | Qty: 2 | Status: AC

## 2015-11-21 MED FILL — Sodium Chloride IV Soln 0.9%: INTRAVENOUS | Qty: 2000 | Status: AC

## 2015-11-21 MED FILL — Calcium Chloride Inj 10%: INTRAVENOUS | Qty: 10 | Status: AC

## 2015-11-21 MED FILL — Potassium Chloride Inj 2 mEq/ML: INTRAVENOUS | Qty: 40 | Status: AC

## 2015-11-21 MED FILL — Sodium Bicarbonate IV Soln 8.4%: INTRAVENOUS | Qty: 50 | Status: AC

## 2015-11-21 MED FILL — Heparin Sodium (Porcine) Inj 1000 Unit/ML: INTRAMUSCULAR | Qty: 30 | Status: AC

## 2015-11-21 MED FILL — Magnesium Sulfate Inj 50%: INTRAMUSCULAR | Qty: 10 | Status: AC

## 2015-11-21 MED FILL — Heparin Sodium (Porcine) Inj 1000 Unit/ML: INTRAMUSCULAR | Qty: 10 | Status: AC

## 2015-11-21 MED FILL — Electrolyte-R (PH 7.4) Solution: INTRAVENOUS | Qty: 3000 | Status: AC

## 2015-11-21 MED FILL — Mannitol IV Soln 20%: INTRAVENOUS | Qty: 500 | Status: AC

## 2015-11-21 MED FILL — Lidocaine HCl IV Inj 20 MG/ML: INTRAVENOUS | Qty: 5 | Status: AC

## 2015-11-21 SURGICAL SUPPLY — 110 items
ADAPTER CARDIO PERF ANTE/RETRO (ADAPTER) IMPLANT
ADAPTER DLP PERFUSION .25INX2I (MISCELLANEOUS) ×4 IMPLANT
APPLICATOR CHLORAPREP 3ML ORNG (MISCELLANEOUS) ×4 IMPLANT
ATTRACTOMAT 16X20 MAGNETIC DRP (DRAPES) ×4 IMPLANT
BAG DECANTER FOR FLEXI CONT (MISCELLANEOUS) ×12 IMPLANT
BLADE STERNUM SYSTEM 6 (BLADE) ×4 IMPLANT
BLADE SURG 10 STRL SS (BLADE) IMPLANT
BLADE SURG 12 STRL SS (BLADE) ×4 IMPLANT
BLADE SURG 15 STRL LF DISP TIS (BLADE) IMPLANT
BLADE SURG 15 STRL SS (BLADE)
CANISTER SUCTION 2500CC (MISCELLANEOUS) ×4 IMPLANT
CANNULA ARTERIAL NVNT 3/8 20FR (MISCELLANEOUS) ×4 IMPLANT
CANNULA MC2 2 STG 36/46 NON-V (CANNULA) ×2 IMPLANT
CANNULA VENOUS 2 STG 34/46 (CANNULA) ×2
CANNULA VENOUS LOW PROF 34X46 (CANNULA) ×4 IMPLANT
CATH CPB KIT VANTRIGT (MISCELLANEOUS) IMPLANT
CATH FOLEY 2WAY SLVR  5CC 14FR (CATHETERS) ×2
CATH FOLEY 2WAY SLVR 5CC 14FR (CATHETERS) ×2 IMPLANT
CATH HEART VENT LEFT (CATHETERS) IMPLANT
CATH HYDRAGLIDE XL THORACIC (CATHETERS) ×4 IMPLANT
CATH ROBINSON RED A/P 18FR (CATHETERS) ×12 IMPLANT
CATH THORACIC 28FR (CATHETERS) ×8 IMPLANT
CHLORAPREP W/TINT 26ML (MISCELLANEOUS) ×4 IMPLANT
CONN ST 1/4X3/8  BEN (MISCELLANEOUS)
CONN ST 1/4X3/8 BEN (MISCELLANEOUS) IMPLANT
COVER SURGICAL LIGHT HANDLE (MISCELLANEOUS) ×8 IMPLANT
CRADLE DONUT ADULT HEAD (MISCELLANEOUS) ×4 IMPLANT
DRAIN CHANNEL 28F RND 3/8 FF (WOUND CARE) IMPLANT
DRAIN CHANNEL 32F RND 10.7 FF (WOUND CARE) IMPLANT
DRAPE BILATERAL SPLIT (DRAPES) ×4 IMPLANT
DRAPE INCISE IOBAN 66X45 STRL (DRAPES) ×4 IMPLANT
DRAPE SLUSH/WARMER DISC (DRAPES) ×4 IMPLANT
DRSG COVADERM 4X14 (GAUZE/BANDAGES/DRESSINGS) ×4 IMPLANT
ELECT BLADE 4.0 EZ CLEAN MEGAD (MISCELLANEOUS) ×4
ELECT BLADE 6.5 EXT (BLADE) ×4 IMPLANT
ELECT CAUTERY BLADE 6.4 (BLADE) ×4 IMPLANT
ELECT PAD GROUND ADT 9 (MISCELLANEOUS) ×8 IMPLANT
ELECT REM PT RETURN 9FT ADLT (ELECTROSURGICAL) ×4
ELECTRODE BLDE 4.0 EZ CLN MEGD (MISCELLANEOUS) ×2 IMPLANT
ELECTRODE REM PT RTRN 9FT ADLT (ELECTROSURGICAL) ×2 IMPLANT
FELT TEFLON 6X6 (MISCELLANEOUS) ×4 IMPLANT
GAUZE SPONGE 4X4 12PLY STRL (GAUZE/BANDAGES/DRESSINGS) ×8 IMPLANT
GLOVE BIO SURGEON STRL SZ7.5 (GLOVE) ×8 IMPLANT
GLOVE BIOGEL M 6.5 STRL (GLOVE) ×4 IMPLANT
GLOVE BIOGEL PI IND STRL 6 (GLOVE) ×4 IMPLANT
GLOVE BIOGEL PI IND STRL 6.5 (GLOVE) ×4 IMPLANT
GLOVE BIOGEL PI INDICATOR 6 (GLOVE) ×4
GLOVE BIOGEL PI INDICATOR 6.5 (GLOVE) ×4
GOWN STRL REUS W/ TWL LRG LVL3 (GOWN DISPOSABLE) ×16 IMPLANT
GOWN STRL REUS W/ TWL XL LVL3 (GOWN DISPOSABLE) ×4 IMPLANT
GOWN STRL REUS W/TWL LRG LVL3 (GOWN DISPOSABLE) ×16
GOWN STRL REUS W/TWL XL LVL3 (GOWN DISPOSABLE) ×4
GRASPER SUT TROCAR 14GX15 (MISCELLANEOUS) ×4 IMPLANT
HEMOSTAT POWDER SURGIFOAM 1G (HEMOSTASIS) ×16 IMPLANT
HEMOSTAT SURGICEL 2X14 (HEMOSTASIS) ×4 IMPLANT
HM II SYSTEM CONTROLLER ×4 IMPLANT
HM II VENTR. ASSIST DEVICE BP (Prosthesis & Implant Heart) ×4 IMPLANT
KIT BASIN OR (CUSTOM PROCEDURE TRAY) ×4 IMPLANT
KIT ROOM TURNOVER OR (KITS) ×4 IMPLANT
KIT SUCTION CATH 14FR (SUCTIONS) ×4 IMPLANT
LEAD PACING MYOCARDI (MISCELLANEOUS) IMPLANT
LINE VENT (MISCELLANEOUS) ×4 IMPLANT
NS IRRIG 1000ML POUR BTL (IV SOLUTION) ×20 IMPLANT
PACK OPEN HEART (CUSTOM PROCEDURE TRAY) ×4 IMPLANT
PAD ARMBOARD 7.5X6 YLW CONV (MISCELLANEOUS) ×8 IMPLANT
PAD DEFIB R2 (MISCELLANEOUS) ×4 IMPLANT
PUNCH AORTIC ROTATE 4.5MM 8IN (MISCELLANEOUS) ×4 IMPLANT
SEALANT SURG COSEAL 8ML (VASCULAR PRODUCTS) ×4 IMPLANT
SET CARDIOPLEGIA MPS 5001102 (MISCELLANEOUS) ×4 IMPLANT
SHEATH AVANTI 11CM 5FR (MISCELLANEOUS) IMPLANT
SPONGE GAUZE 4X4 12PLY STER LF (GAUZE/BANDAGES/DRESSINGS) ×4 IMPLANT
SUCKER INTRACARDIAC WEIGHTED (SUCKER) ×4 IMPLANT
SUT ETHIBOND 2 0 SH (SUTURE) ×10
SUT ETHIBOND 2 0 SH 36X2 (SUTURE) ×10 IMPLANT
SUT ETHIBOND 5 LR DA (SUTURE) ×8 IMPLANT
SUT ETHIBOND NAB MH 2-0 36IN (SUTURE) ×48 IMPLANT
SUT PROLENE 3 0 SH DA (SUTURE) ×8 IMPLANT
SUT PROLENE 4 0 RB 1 (SUTURE) ×10
SUT PROLENE 4-0 RB1 .5 CRCL 36 (SUTURE) ×10 IMPLANT
SUT PROLENE 5 0 C1 (SUTURE) IMPLANT
SUT SILK  1 MH (SUTURE) ×8
SUT SILK 1 MH (SUTURE) ×8 IMPLANT
SUT SILK 1 TIES 10X30 (SUTURE) ×4 IMPLANT
SUT SILK 2 0 SH CR/8 (SUTURE) ×8 IMPLANT
SUT SILK 3 0 SH CR/8 (SUTURE) ×4 IMPLANT
SUT STEEL 6MS V (SUTURE) ×8 IMPLANT
SUT STEEL SZ 6 DBL 3X14 BALL (SUTURE) ×4 IMPLANT
SUT TEM PAC WIRE 2 0 SH (SUTURE) IMPLANT
SUT VIC AB 1 CTX 18 (SUTURE) ×4 IMPLANT
SUT VIC AB 1 CTX 36 (SUTURE) ×4
SUT VIC AB 1 CTX36XBRD ANBCTR (SUTURE) ×4 IMPLANT
SUT VIC AB 2-0 CT1 27 (SUTURE) ×2
SUT VIC AB 2-0 CT1 TAPERPNT 27 (SUTURE) ×2 IMPLANT
SUT VIC AB 2-0 CTX 27 (SUTURE) ×8 IMPLANT
SUT VIC AB 3-0 SH 8-18 (SUTURE) ×4 IMPLANT
SUT VIC AB 3-0 X1 27 (SUTURE) ×12 IMPLANT
SUT VICRYL 2 TP 1 (SUTURE) IMPLANT
SYR 50ML LL SCALE MARK (SYRINGE) ×4 IMPLANT
SYSTEM SAHARA CHEST DRAIN ATS (WOUND CARE) ×4 IMPLANT
TAPE CLOTH SURG 4X10 WHT LF (GAUZE/BANDAGES/DRESSINGS) ×4 IMPLANT
TAPE PAPER MEDFIX 1IN X 10YD (GAUZE/BANDAGES/DRESSINGS) ×4 IMPLANT
TOWEL OR 17X26 10 PK STRL BLUE (TOWEL DISPOSABLE) ×12 IMPLANT
TRAY CATH LUMEN 1 20CM STRL (SET/KITS/TRAYS/PACK) ×4 IMPLANT
TRAY FOLEY IC TEMP SENS 14FR (CATHETERS) ×4 IMPLANT
TUBE CONNECTING 12'X1/4 (SUCTIONS) ×1
TUBE CONNECTING 12X1/4 (SUCTIONS) ×3 IMPLANT
UNDERPAD 30X30 INCONTINENT (UNDERPADS AND DIAPERS) ×4 IMPLANT
VENT LEFT HEART 12002 (CATHETERS)
WATER STERILE IRR 1000ML POUR (IV SOLUTION) ×8 IMPLANT
YANKAUER SUCT BULB TIP NO VENT (SUCTIONS) ×4 IMPLANT

## 2015-11-21 NOTE — Transfer of Care (Signed)
Immediate Anesthesia Transfer of Care Note  Patient: Matthew Vaughan  Procedure(s) Performed: Procedure(s) with comments: INSERTION OF IMPLANTABLE LEFT VENTRICULAR ASSIST DEVICE (N/A) - CIRC ARREST  NITRIC OXIDE TRANSESOPHAGEAL ECHOCARDIOGRAM (TEE) (N/A)  Patient Location: SICU  Anesthesia Type:General  Level of Consciousness: Patient remains intubated per anesthesia plan  Airway & Oxygen Therapy: Patient remains intubated per anesthesia plan and Patient placed on Ventilator (see vital sign flow sheet for setting)  Post-op Assessment: Report given to RN and Post -op Vital signs reviewed and stable  Post vital signs: Reviewed and stable  Last Vitals:  Filed Vitals:   11/20/15 2350 11/21/15 0418  BP: 104/89 102/76  Pulse: 110   Temp: 37.1 C 36.8 C  Resp:    HR 115, MAP 89, RR 16 on ventilator, sats 100% at 1358  Last Pain:  Filed Vitals:   11/21/15 1236  PainSc: 0-No pain      Patients Stated Pain Goal: 0 (11/09/15 0333)  Complications: No apparent anesthesia complications

## 2015-11-21 NOTE — Brief Op Note (Signed)
11/05/2015 - 11/21/2015  2:21 PM  PATIENT:  Thurman Coyer  60 y.o. male  PRE-OPERATIVE DIAGNOSIS:  HEART FAILURE  POST-OPERATIVE DIAGNOSIS:  HEART FAILURE  PROCEDURE:  Procedure(s) with comments: INSERTION OF IMPLANTABLE LEFT VENTRICULAR ASSIST DEVICE  TRANSESOPHAGEAL ECHOCARDIOGRAM (TEE) (N/A)  SURGEON:  Surgeon(s) and Role:    * Alleen Borne, MD - Primary    * Kerin Perna, MD - Assisting  PHYSICIAN ASSISTANT: none   ANESTHESIA:   general  EBL:  Total I/O In: 2650 [I.V.:2200; Blood:450] Out: 2450 [Urine:1550; Blood:900]    DRAINS: 2 MT's, pericardial drain, and left pleural tube   LOCAL MEDICATIONS USED:  NONE  SPECIMEN:  Source of Specimen:  myocardial plug  DISPOSITION OF SPECIMEN:  PATHOLOGY  COUNTS:  YES  TOURNIQUET:  * No tourniquets in log *  DICTATION: .Note written in EPIC  PLAN OF CARE: Admit to inpatient   PATIENT DISPOSITION:  ICU - intubated and hemodynamically stable.   Delay start of Pharmacological VTE agent (>24hrs) due to surgical blood loss or risk of bleeding: yes

## 2015-11-21 NOTE — Anesthesia Postprocedure Evaluation (Signed)
Anesthesia Post Note  Patient: Matthew Vaughan  Procedure(s) Performed: Procedure(s) (LRB): INSERTION OF IMPLANTABLE LEFT VENTRICULAR ASSIST DEVICE (N/A) TRANSESOPHAGEAL ECHOCARDIOGRAM (TEE) (N/A)  Patient location during evaluation: SICU Anesthesia Type: General Level of consciousness: sedated Pain management: pain level controlled Vital Signs Assessment: post-procedure vital signs reviewed and stable Respiratory status: patient remains intubated per anesthesia plan Cardiovascular status: stable Anesthetic complications: no    Last Vitals:  Filed Vitals:   11/21/15 1359 11/21/15 1400  BP:  89/81  Pulse: 115 114  Temp: 37.2 C 37.2 C  Resp: 16 16    Last Pain:  Filed Vitals:   11/21/15 1421  PainSc: 0-No pain                 Liddie Chichester,W. EDMOND

## 2015-11-21 NOTE — OR Nursing (Signed)
1st call made to SICU @1223 , providing a patient update 2nd call made to SICU Wynona Canes) @1232 , confirming plans for transfer to Room 7 postoperatively. 3rd call made to SICU (20 minute call) @1250 , providing a patient update. 4th call made to SICU @1335  (on the way call).

## 2015-11-21 NOTE — Progress Notes (Signed)
Pharmacy Antibiotic Note  Matthew Vaughan is a 60 y.o. male admitted on 11/05/2015 with NSTEMI, now s/p LVAD.  Pharmacy has been consulted for vancomycin dosing for post-op prophylaxis.  Pre-op vancomycin 1250 mg IV at 07:55.   Plan: Vancomycin 1250 mg IV q12h x 3 doses  Monitor renal function and adjust as needed  Height: 5\' 11"  (180.3 cm) Weight: 158 lb 8.2 oz (71.9 kg) IBW/kg (Calculated) : 75.3  Temp (24hrs), Avg:98.7 F (37.1 C), Min:97.8 F (36.6 C), Max:99.1 F (37.3 C)   Recent Labs Lab 11/18/15 0300 11/19/15 0340 11/20/15 0529 11/21/15 0415 11/21/15 0938 11/21/15 1026 11/21/15 1219 11/21/15 1415  WBC 4.7 5.0 4.0 5.1  --   --   --  8.2  CREATININE 1.00 1.04 1.11 1.00 0.70 0.70 0.80  --     Estimated Creatinine Clearance: 102.4 mL/min (by C-G formula based on Cr of 0.8).    No Known Allergies  Microbiology results: 5/6 MRSA PCR: neg  Thank you for allowing pharmacy to be a part of this patient's care.  [employer], [address].D., BCPS Clinical Pharmacist Pager: [phone] 11/21/2015 3:40 PM

## 2015-11-21 NOTE — Progress Notes (Signed)
Patient ID: Matthew Vaughan, male   DOB: [birth year], 58 y.o.   MRN: [id number]   SICU Evening Rounds:   Hemodynamically stable on Milrinone 0.375, levophed 15 mcg and NO 30 ppm. MAP is 90 so we can wean levophed to keep MAP about 70. CI = 2, SVO2 67%  Has started to wake up on vent. Moves all extremities to command   Urine output good  CT output low  CBC    Component Value Date/Time   WBC 8.2 11/21/2015 1415   RBC 3.25* 11/21/2015 1415   HGB 10.6* 11/21/2015 1415   HCT 32.4* 11/21/2015 1415   HCT 35.5* 11/17/2015 0400   PLT 152 11/21/2015 1535   MCV 99.7 11/21/2015 1415   MCH 32.6 11/21/2015 1415   MCHC 32.7 11/21/2015 1415   RDW 15.6* 11/21/2015 1415     BMET    Component Value Date/Time   NA 141 11/21/2015 1605   K 4.5 11/21/2015 1605   CL 112* 11/21/2015 1605   CO2 23 11/21/2015 1605   GLUCOSE 92 11/21/2015 1605   BUN 10 11/21/2015 1605   CREATININE 1.03 11/21/2015 1605   CALCIUM 8.6* 11/21/2015 1605   GFRNONAA >60 11/21/2015 1605   GFRAA >60 11/21/2015 1605     A/P:  Stable postop course. Continue current plans. Plan to wean NO overnight to extubate in the am.

## 2015-11-21 NOTE — Op Note (Signed)
CARDIOVASCULAR SURGERY OPERATIVE NOTE  Tavarius Grewe [id number] 11/21/2015   Surgeon:  Alleen Borne, MD  First Assistant: Lovett Sox, MD  Preoperative Diagnosis: Class IV heart failure with LVEF< 20%   Postoperative Diagnosis:  Same   Procedure:   1. Median Sternotomy 2. Extracorporeal circulation 3.   Implantation of HeartMate II Left Ventricular Assist Device.   Anesthesia:  General Endotracheal   Clinical History/Surgical Indication:  This patient is a 60 year old gentleman with nonischemic cardiomyopathy of undetermined etiology and acute on chronic systolic heart failure and cardiogenic shock requiring Milrinone and Levophed. Admitted 11/05/2015. RHC on admission showed markedly elevated filling pressures and low CI of 1.39. Cardiac MRI showed severe LV dilation with an EF of 8%, a small anterior LV thrombus and mildly dilated RV with moderate systolic dysfunction. He was initially treated with Milrinone and lasix and the Milrinone was weaned off but had to be restarted 4/28 and increased to 0.375 and Levophed added for low BP. RHC on Friday showed optimized hemodynamics. He was felt to be hemodynamically tenuous on two inotropes and LVAD placement was felt to be the only option for him. He had pending Medicaid and was not stable enough to be evaluated for transplant so the LVAD is being placed as destination therapy.   Preparation:  The patient was seen in the preoperative holding area and the correct patient, correct operation were confirmed with the patient after reviewing the medical record and catheterization. The consent was signed by me. Preoperative antibiotics were given. A pulmonary arterial line and radial arterial line were placed by the anesthesia team. The patient was taken back to the operating room and positioned supine on the operating room table. After being placed under general endotracheal anesthesia by the anesthesia team a foley catheter was placed.  The neck, chest, abdomen, and both legs were prepped with betadine soap and solution and draped in the usual sterile manner. A surgical time-out was taken and the correct patient and operative procedure were confirmed with the nursing and anesthesia staff.   Pre-bypass and Post-bypass TEE:   Complete TEE assessment was performed by Marguerita Merles, MD    *[place]*  *[facility]*  [address]  [place], [place] [address]  [phone]  ------------------------------------------------------------------- Intraoperative Transesophageal Echocardiography  Patient: Kohan, Azizi MR #: [id number] Study Date: 11/21/2015 Gender: M Age: 53 Height: 154.9 cm Weight: 71.8 kg BSA: 1.78 m^2 Pt. Status: Room: 2S07C  ATTENDING Evelene Croon, MD ADMITTING Default, Provider [phone] Mickel Crow VanTrigt, Peter PERFORMING Autumn Patty MD SONOGRAPHER Sheralyn Boatman  cc:  ------------------------------------------------------------------- LV EF: 5% - 10%  ------------------------------------------------------------------- Indications: Cardiomyopathy - ischemic 414.8.  ------------------------------------------------------------------- History: PMH: LVAD case. LV thrombus. Congestive heart failure. PMH: Myocardial infarction.  ------------------------------------------------------------------- Study Conclusions  - Left ventricle: The cavity size was severely dilated. The  estimated ejection fraction was in the range of 5% to 10%. Severe  diffuse hypokinesis. - Aortic valve: No evidence of vegetation. - Mitral valve: There was mild regurgitation, with a single jet  directed centrally. - Left atrium: The atrium was moderately to severely dilated. No   evidence of thrombus in the atrial cavity or appendage. - Right atrium: The atrium was moderately dilated. - Atrial septum: No defect or patent foramen ovale was identified. - Tricuspid valve: No evidence of vegetation. - Pulmonic valve: No evidence of vegetation.  Intraoperative transesophageal echocardiography. Birthdate: Patient birthdate: [birth year]. Age: Patient is 60 yr old. Sex: Gender: male. BMI: 29.9 kg/m^2.  Blood pressure: 102/76 Patient status: Inpatient. Study date: Study date: 11/21/2015. Study time: 07:31 AM. Location: Operating room.  -------------------------------------------------------------------  ------------------------------------------------------------------- Left ventricle: The cavity size was severely dilated. There was no hypertrophy. The estimated ejection fraction was in the range of 5% to 10%. Severe diffuse hypokinesis.  ------------------------------------------------------------------- Aortic valve: Structurally normal valve. Trileaflet. Cusp separation was normal. No evidence of vegetation. Doppler: There was no regurgitation.  ------------------------------------------------------------------- Aorta: The aorta was normal, not dilated, and non-diseased.  ------------------------------------------------------------------- Mitral valve: Normal thickness, noncalcified leaflets . Leaflet separation was normal. Doppler: There was mild regurgitation, with a single jet directed centrally.  ------------------------------------------------------------------- Left atrium: The atrium was moderately to severely dilated. No evidence of thrombus in the atrial cavity or appendage.  ------------------------------------------------------------------- Atrial septum: No defect or patent foramen ovale was identified.  ------------------------------------------------------------------- Right ventricle: The cavity size  was normal. Wall thickness was normal. Systolic function was normal.  ------------------------------------------------------------------- Pulmonic valve: Structurally normal valve. Cusp separation was normal. No evidence of vegetation.  ------------------------------------------------------------------- Tricuspid valve: Structurally normal valve. Leaflet separation was normal. No evidence of vegetation. Doppler: There was no regurgitation.  ------------------------------------------------------------------- Right atrium: The atrium was moderately dilated.  ------------------------------------------------------------------- Post bypass:  - LV size was unchanged from the prior stage. - The estimated LV ejection fraction was 10%. - There was diffuse LV hypokinesis. - LVAD in good position directed towards the mitral valve. MR  unchanged. Aortic valve opens intermittently. RV contracting  well.  ------------------------------------------------------------------- Post procedure conclusions Ascending Aorta:  - The aorta was normal, not dilated, and non-diseased.  ------------------------------------------------------------------- Prepared and Electronically Authenticated by  Gaynelle Adu 2017-05-08T16:08:34   Creation of pump pocket:   A median sternotomy was performed. The pericardium was opened in the midline. Right ventricular function appeared moderately depressed with mild distension. PAP was 43/19 with CVP 4. The ascending aorta was of normal size and had no palpable plaque. There were no contraindications to aortic cannulation or cross-clamping. The left side of the sternum with retracted with the Ruhltract. The left diaphragm was taken down from the anterior chest wall using electrocautery. This was continued laterally as far as possible. A pre-peritoneal plane was developed inferiorly and extended across the mid line toward the right. All  blood vessels of any size were clipped and divided. The prototype was placed into the pocket to confirm the right size. Then a 4 cm transverse incision was made to the right of the umbilicus and continued down to the rectus fascia using electrocautery. A skin punch was used to create the drive line exit site about 3 cm below the right costal margin in the anterior axillary line. The straight tunneling device was passed in a subcutaneous plane from the transverse abdominal incision to a point midway to the pericardium and then through the rectus fascia into the pre-peritoneal space. The curved tunneling device was passed from the abdominal incision the the skin exit site.  Cardiopulmonary Bypass:   The patient was fully systemically heparinized and the ACT was maintained > 400 sec. The proximal aortic arch was cannulated with a 20 F aortic cannula for arterial inflow. Venous cannulation was performed via the right atrial appendage using a two-staged venous cannula. Carbon dioxide was insufflated into the pericardium at 6L/min throughout the procedure to minimize intracardiac air. The systemic temperature was allowed to drift downward slightly during the procedure and the patient was actively rewarmed.   Aortic outflow graft anastomosis:  The outflow graft was cut to the appropriate length. It was placed inside  the bend relief. The ascending aorta was partially occluded with a Lemole clamp. A vertical midline aortotomy was created and the ends enlarged with a 4.5 mm punch. The outflow graft was anastomosed to the aorta using 4-0 prolene pledgetted sutures at the toe and heal that were run down each side. Coseal was used to seal the needle holes in the graft. The Lemole clamp was removed and the graft flushed and clamped with a peripheral Debakey clamp.   Insertion of apical outflow cannula:  The patient was placed on cardiopulmonary bypass. Laparotomy pads were placed behind the heart to aid exposure of  the apex. A site was chosen on the anterior wall lateral to the LAD and superior to the true apex. A stab incision was made and a foley catheter placed through the coring device and into the left ventricle. The balloon was inflated and with gentle traction on the catheter the coring device was carefully advanced to create the ventriculotomy. The balloon was deflated and removed. A sump was placed into the LV and the ventricular core excised. Trabeculations that might cause obstruction were excised. There was a small amount of old laminated thrombus along the basilar portion of the anterior wall that appeared very adherent to the endocardium and was not removed. Then a series of 2-0 Ethibond horizontal mattress sutures were placed around the ventriculotomy. This took 16 sutures. The sutures were placed through the sewing ring on the outflow sleeve. The sutures were tied and the sleeve appeared to be well-sealed to the apex. Coseal was applied to seal needle holes. Then volume was left in the heart and the lungs inflated. The HeartMate II was brought onto the operative field. The inflow cannula was inserted into the apical sleeve and the sleeve suture tied. The outflow end of the pump was connected to vent suction. A tie band was placed around the apical sleeve followed by two #5 Ethibond ties. The drive line was then tunneled using the previously placed tunneling devices. A loop was placed in the preperitoneal space. The outflow graft was then attached to the pump. A 20 gauge needle was placed into the outflow graft for deairing. The pump was placed into the pocket.   Weaning from bypass:  The patient was started on Milrinone 0.375 mcg/kg/min, epinephrine 4 mcg/kg/min, and nitric oxide at 30 ppm. The HeartMate II pump was started at 6000 rpm. TEE confirmed that the intracardiac air was removed.  Cardiopulmonary bypass flow was decreased to half flow and the clamp removed from the outflow graft. The speed was  increased to 7000 rpm. Flow was decreased to 1/4 and the speed increased to 8000. Cardiopulmonary bypass was discontinued and the speed increased to 8400. Pulmonary artery pressures were normal at 40/16 with a CVP of 6. CO was 6-8 L/min. CPB time was 80 minutes. Pump speed was gradually increased to 9000.   Completion:  Two temporary epicardial pacing wires were placed on the right ventricle and two temporary atrial pacing wires on the right atrium. Heparin was fully reversed with protamine and the aortic and venous cannulas removed. Hemostasis was achieved. Mediastinal drainage tubes and bilateral pleural chest tubes were placed with a drain in the pocket.  The sternum was closed with double  #6 stainless steel wires. The fascia was closed with continuous # 1 vicryl suture with interrupted # 1 vicryl sutures in the lower incisional fascia. The subcutaneous tissue was closed with 2-0 vicryl continuous suture. The skin was closed with 3-0 vicryl subcuticular  suture. The abdominal incision was closed with continuous 3-0 vicryl subcutaneous suture and 3-0 vicryl subcuticular skin suture. The drive line exit site was re-approximated with 3-0 vicryl subcutaneous sutures and 3-0 nylon skin sutures. The drive line fastener was applied. All sponge, needle, and instrument counts were reported correct at the end of the case. Dry sterile dressings were placed over the incisions and around the chest tubes which were connected to pleurevac suction. The patient was then transported to the surgical intensive care unit in critical but stable condition.

## 2015-11-21 NOTE — Anesthesia Procedure Notes (Addendum)
Central Venous Catheter Insertion Performed by: anesthesiologist 11/21/2015 7:15 AM Patient location: Pre-op. Preanesthetic checklist: patient identified, IV checked, site marked, risks and benefits discussed, surgical consent, monitors and equipment checked, pre-op evaluation, timeout performed and anesthesia consent Position: Trendelenburg Lidocaine 1% used for infiltration Landmarks identified and Seldinger technique used Catheter size: 8.5 Fr PA cath was placed.Sheath introducer Swan type and PA catheter depth:thermodilation and oximetry and 50PA Cath depth:50 Procedure performed using ultrasound guided technique. Attempts: 1 Following insertion, line sutured and dressing applied. Post procedure assessment: free fluid flow, blood return through all ports and no air. Patient tolerated the procedure well with no immediate complications.    Central Venous Catheter Insertion Performed by: anesthesiologist 11/21/2015 7:16 AM Patient location: Pre-op. Preanesthetic checklist: patient identified, IV checked, site marked, risks and benefits discussed, surgical consent, monitors and equipment checked, pre-op evaluation, timeout performed and anesthesia consent Position: Trendelenburg Lidocaine 1% used for infiltration Landmarks identified and Seldinger technique used Catheter size: 8 Fr Central line was placed.Double lumen Procedure performed using ultrasound guided technique. Attempts: 1 Following insertion, dressing applied, line sutured and Biopatch. Post procedure assessment: blood return through all ports, free fluid flow and no air. Patient tolerated the procedure well with no immediate complications.    Procedure Name: Intubation Date/Time: 11/21/2015 7:47 AM Performed by: Glo Herring B Pre-anesthesia Checklist: Patient identified, Emergency Drugs available, Timeout performed, Patient being monitored and Suction available Patient Re-evaluated:Patient Re-evaluated prior to  inductionOxygen Delivery Method: Circle system utilized Preoxygenation: Pre-oxygenation with 100% oxygen Intubation Type: IV induction Ventilation: Mask ventilation without difficulty Laryngoscope Size: Mac and 4 Grade View: Grade II Tube type: Subglottic suction tube Tube size: 8.0 mm Number of attempts: 1 Airway Equipment and Method: Stylet Placement Confirmation: CO2 detector,  positive ETCO2,  ETT inserted through vocal cords under direct vision and breath sounds checked- equal and bilateral Secured at: 23 cm Tube secured with: Tape Dental Injury: Teeth and Oropharynx as per pre-operative assessment

## 2015-11-21 NOTE — Progress Notes (Signed)
CSW met with patient's brother in the waiting room. Patient in surgery for implantation of LVAD today. Patient's brother states he came early this morning to visit with his brother prior to going to the OR. CSW checked in with brother throughout the day and provided updates from the OR. CSW escorted brother to bedside post op in SICU. Patient's brother asked appropriate questions and provided nursing staff with family contact information. He plans to return tomorrow late morning. CSW will continue to follow patient and family for supportive intervention throughout implant hospitalization. Raquel Sarna, LCSW [phone]

## 2015-11-21 NOTE — Progress Notes (Signed)
  Echocardiogram Echocardiogram Transesophageal has been performed.  Matthew Vaughan 11/21/2015, 8:31 AM

## 2015-11-22 ENCOUNTER — Inpatient Hospital Stay (HOSPITAL_COMMUNITY): Payer: Medicaid Other

## 2015-11-22 ENCOUNTER — Encounter (HOSPITAL_COMMUNITY): Payer: Self-pay | Admitting: Surgery

## 2015-11-22 DIAGNOSIS — Z95811 Presence of heart assist device: Secondary | ICD-10-CM

## 2015-11-22 LAB — CBC
HCT: 27.3 % — ABNORMAL LOW (ref 39.0–52.0)
HEMOGLOBIN: 8.9 g/dL — AB (ref 13.0–17.0)
MCH: 32.6 pg (ref 26.0–34.0)
MCHC: 32.6 g/dL (ref 30.0–36.0)
MCV: 100 fL (ref 78.0–100.0)
Platelets: 144 10*3/uL — ABNORMAL LOW (ref 150–400)
RBC: 2.73 MIL/uL — ABNORMAL LOW (ref 4.22–5.81)
RDW: 16.4 % — ABNORMAL HIGH (ref 11.5–15.5)
WBC: 9.1 10*3/uL (ref 4.0–10.5)

## 2015-11-22 LAB — POCT I-STAT, CHEM 8
BUN: 10 mg/dL (ref 6–20)
CALCIUM ION: 1.26 mmol/L — AB (ref 1.12–1.23)
CHLORIDE: 103 mmol/L (ref 101–111)
Creatinine, Ser: 1.1 mg/dL (ref 0.61–1.24)
GLUCOSE: 106 mg/dL — AB (ref 65–99)
HCT: 30 % — ABNORMAL LOW (ref 39.0–52.0)
HEMOGLOBIN: 10.2 g/dL — AB (ref 13.0–17.0)
POTASSIUM: 4.4 mmol/L (ref 3.5–5.1)
SODIUM: 138 mmol/L (ref 135–145)
TCO2: 23 mmol/L (ref 0–100)

## 2015-11-22 LAB — CBC WITH DIFFERENTIAL/PLATELET
BASOS ABS: 0 10*3/uL (ref 0.0–0.1)
Basophils Relative: 1 %
EOS PCT: 4 %
Eosinophils Absolute: 0.3 10*3/uL (ref 0.0–0.7)
HCT: 28.1 % — ABNORMAL LOW (ref 39.0–52.0)
Hemoglobin: 9.3 g/dL — ABNORMAL LOW (ref 13.0–17.0)
LYMPHS PCT: 15 %
Lymphs Abs: 1.1 10*3/uL (ref 0.7–4.0)
MCH: 32.4 pg (ref 26.0–34.0)
MCHC: 33.1 g/dL (ref 30.0–36.0)
MCV: 97.9 fL (ref 78.0–100.0)
MONOS PCT: 8 %
Monocytes Absolute: 0.6 10*3/uL (ref 0.1–1.0)
Neutro Abs: 5.4 10*3/uL (ref 1.7–7.7)
Neutrophils Relative %: 72 %
PLATELETS: 156 10*3/uL (ref 150–400)
RBC: 2.87 MIL/uL — ABNORMAL LOW (ref 4.22–5.81)
RDW: 16.3 % — AB (ref 11.5–15.5)
WBC: 7.5 10*3/uL (ref 4.0–10.5)

## 2015-11-22 LAB — PREPARE FRESH FROZEN PLASMA
UNIT DIVISION: 0
UNIT DIVISION: 0
Unit division: 0
Unit division: 0

## 2015-11-22 LAB — POCT I-STAT 3, ART BLOOD GAS (G3+)
ACID-BASE DEFICIT: 3 mmol/L — AB (ref 0.0–2.0)
Acid-base deficit: 1 mmol/L (ref 0.0–2.0)
Acid-base deficit: 2 mmol/L (ref 0.0–2.0)
Acid-base deficit: 3 mmol/L — ABNORMAL HIGH (ref 0.0–2.0)
BICARBONATE: 23.3 meq/L (ref 20.0–24.0)
Bicarbonate: 22.2 mEq/L (ref 20.0–24.0)
Bicarbonate: 23.4 mEq/L (ref 20.0–24.0)
Bicarbonate: 22.7 mEq/L (ref 20.0–24.0)
O2 SAT: 100 %
O2 SAT: 99 %
O2 SAT: 98 %
O2 SAT: 99 %
PCO2 ART: 36.4 mmHg (ref 35.0–45.0)
PCO2 ART: 35.2 mmHg (ref 35.0–45.0)
PCO2 ART: 45.5 mmHg — AB (ref 35.0–45.0)
PH ART: 7.321 — AB (ref 7.350–7.450)
PH ART: 7.328 — AB (ref 7.350–7.450)
PO2 ART: 182 mmHg — AB (ref 80.0–100.0)
PO2 ART: 127 mmHg — AB (ref 80.0–100.0)
Patient temperature: 37.2
Patient temperature: 37.4
TCO2: 24 mmol/L (ref 0–100)
TCO2: 23 mmol/L (ref 0–100)
TCO2: 25 mmol/L (ref 0–100)
TCO2: 24 mmol/L (ref 0–100)
pCO2 arterial: 43 mmHg (ref 35.0–45.0)
pH, Arterial: 7.416 (ref 7.350–7.450)
pH, Arterial: 7.41 (ref 7.350–7.450)
pO2, Arterial: 113 mmHg — ABNORMAL HIGH (ref 80.0–100.0)
pO2, Arterial: 138 mmHg — ABNORMAL HIGH (ref 80.0–100.0)

## 2015-11-22 LAB — COMPREHENSIVE METABOLIC PANEL
ALBUMIN: 2.5 g/dL — AB (ref 3.5–5.0)
ALK PHOS: 53 U/L (ref 38–126)
ALT: 30 U/L (ref 17–63)
AST: 82 U/L — AB (ref 15–41)
Anion gap: 10 (ref 5–15)
BILIRUBIN TOTAL: 5.7 mg/dL — AB (ref 0.3–1.2)
BUN: 10 mg/dL (ref 6–20)
CALCIUM: 8.4 mg/dL — AB (ref 8.9–10.3)
CO2: 21 mmol/L — AB (ref 22–32)
Chloride: 109 mmol/L (ref 101–111)
Creatinine, Ser: 1.17 mg/dL (ref 0.61–1.24)
GFR calc Af Amer: >60 mL/min (ref >60)
GFR calc non Af Amer: >60 mL/min (ref >60)
GLUCOSE: 108 mg/dL — AB (ref 65–99)
POTASSIUM: 4.6 mmol/L (ref 3.5–5.1)
Sodium: 140 mmol/L (ref 135–145)
TOTAL PROTEIN: 5.6 g/dL — AB (ref 6.5–8.1)

## 2015-11-22 LAB — LACTATE DEHYDROGENASE: LDH: 302 U/L — ABNORMAL HIGH (ref 98–192)

## 2015-11-22 LAB — CREATININE, SERUM
CREATININE: 1.18 mg/dL (ref 0.61–1.24)
GFR calc Af Amer: >60 mL/min (ref >60)
GFR calc non Af Amer: >60 mL/min (ref >60)

## 2015-11-22 LAB — CARBOXYHEMOGLOBIN
CARBOXYHEMOGLOBIN: 1.8 % — AB (ref 0.5–1.5)
METHEMOGLOBIN: 1 % (ref 0.0–1.5)
O2 Saturation: 74.9 %
TOTAL HEMOGLOBIN: 9.9 g/dL — AB (ref 13.5–18.0)

## 2015-11-22 LAB — BRAIN NATRIURETIC PEPTIDE: B NATRIURETIC PEPTIDE 5: 585.9 pg/mL — AB (ref 0.0–100.0)

## 2015-11-22 LAB — CALCIUM, IONIZED: Calcium, Ionized, Serum: 5.2 mg/dL (ref 4.5–5.6)

## 2015-11-22 LAB — GLUCOSE, CAPILLARY
GLUCOSE-CAPILLARY: 106 mg/dL — AB (ref 65–99)
GLUCOSE-CAPILLARY: 110 mg/dL — AB (ref 65–99)
GLUCOSE-CAPILLARY: 95 mg/dL (ref 65–99)
Glucose-Capillary: 105 mg/dL — ABNORMAL HIGH (ref 65–99)

## 2015-11-22 LAB — PHOSPHORUS: Phosphorus: 3.4 mg/dL (ref 2.5–4.6)

## 2015-11-22 LAB — MAGNESIUM
Magnesium: 2 mg/dL (ref 1.7–2.4)
Magnesium: 1.9 mg/dL (ref 1.7–2.4)

## 2015-11-22 LAB — PROTIME-INR
INR: 1.49 (ref 0.00–1.49)
PROTHROMBIN TIME: 18.1 s — AB (ref 11.6–15.2)

## 2015-11-22 MED ORDER — WARFARIN - PHYSICIAN DOSING INPATIENT
Freq: Every day | Status: DC
  Administered 2015-11-22: 18:00:00

## 2015-11-22 MED ORDER — WARFARIN SODIUM 2.5 MG PO TABS
2.5000 mg | ORAL_TABLET | Freq: Once | ORAL | Status: AC
  Administered 2015-11-22: 2.5 mg via ORAL
  Filled 2015-11-22: qty 1

## 2015-11-22 MED ORDER — KETOROLAC TROMETHAMINE 15 MG/ML IJ SOLN
15.0000 mg | Freq: Once | INTRAMUSCULAR | Status: AC
  Administered 2015-11-22: 15 mg via INTRAVENOUS
  Filled 2015-11-22: qty 1

## 2015-11-22 MED ORDER — CETYLPYRIDINIUM CHLORIDE 0.05 % MT LIQD
7.0000 mL | Freq: Two times a day (BID) | OROMUCOSAL | Status: DC
  Administered 2015-11-22 – 2015-11-24 (×5): 7 mL via OROMUCOSAL

## 2015-11-22 MED FILL — Sodium Chloride IV Soln 0.9%: INTRAVENOUS | Qty: 250 | Status: AC

## 2015-11-22 MED FILL — Vasopressin Inj 20 Unit/ML: INTRAMUSCULAR | Qty: 3 | Status: AC

## 2015-11-22 NOTE — Progress Notes (Signed)
CSW met with patient at bedside. Patient is day one post VAD implant and was extubated this morning. Patient states he is feeling well although still recovering from intubation with groggy voice. Patient in good spirits and motivated for recovery. CSW will continue to follow for supportive intervention throughout implant hospitalization. Raquel Sarna, LCSW [phone]

## 2015-11-22 NOTE — Progress Notes (Signed)
PT Cancellation Note  Patient Details Name: Jermarcus Grauberger MRN: [id number] DOB: 13-Jun-1956   Cancelled Treatment:    Reason Eval/Treat Not Completed: Other (comment) (to begin post op day 2). Will see tomorrow.   Brenlee Koskela 11/22/2015, 8:33 AM [facility] PT [phone]

## 2015-11-22 NOTE — Procedures (Signed)
Extubation Procedure Note  Patient Details:   Name: Matthew Vaughan DOB: 03-19-1956 MRN: [id number]   Airway Documentation:     Evaluation  O2 sats: stable throughout Complications: No apparent complications Patient did tolerate procedure well. Bilateral Breath Sounds: Clear   Yes   Pt. Was extubated to a 4L [place] with nitric at 2 ppm without any complications, dyspnea or stridor noted. Pt. Was instructed on IS but demonstrated poor effort. Pt. Achieved 1.2L on VC & -20 on NIF.  Matthew Vaughan L 11/22/2015, 9:05 AM

## 2015-11-22 NOTE — Progress Notes (Signed)
Nutrition Follow-up  DOCUMENTATION CODES:   Non-severe (moderate) malnutrition in context of chronic illness  INTERVENTION:   Advance diet as medically appropriate, resume Ensure Enlive BID once/as able  NUTRITION DIAGNOSIS:   Malnutrition related to chronic illness as evidenced by mild depletion of body fat, mild depletion of muscle mass  Ongoing  GOAL:   Patient will meet greater than or equal to 90% of their needs  Currently unmet  MONITOR:   Diet advancement, PO intake, Supplement acceptance, Labs, Weight trends, I & O's  ASSESSMENT:   60 yo with history of nonischemic cardiomyopathy and apparently a prior LV thrombus presented with acute/chronic systolic CHF. He is followed by a cardiologist in [place].   Patient s/p procedures 5/8: IMPLANTATION OF HEARTMATE II LEFT VENTRICULAR DEVICE  Prior to surgery, pt was on a Heart Healthy diet.  PO intake was good at 75-100% per flowsheets. Was also receiving and drinking Ensure Enlive supplements. Pt currently NPO, s/p extubation this AM. Ensure Enlive BID order remains in place.  Diet Order:  Diet NPO time specified  Skin:  Reviewed, no issues  Last BM:  5/4  Height:   Ht Readings from Last 1 Encounters:  11/21/15 5\' 11"  (1.803 m)    Weight:   Wt Readings from Last 1 Encounters:  11/22/15 170 lb 3.1 oz (77.2 kg)    Ideal Body Weight:  78.2 kg  BMI:  Body mass index is 23.75 kg/(m^2).  Estimated Nutritional Needs:   Kcal:  1900-2100  Protein:  105-120 grams  Fluid:  1.9-2.1 L  EDUCATION NEEDS:   No education needs identified at this time  Maureen Chatters, RD, LDN Pager #: [phone] After-Hours Pager #: [phone]

## 2015-11-22 NOTE — Progress Notes (Signed)
Patient ID: Matthew Vaughan, male   DOB: 07-26-1955, 60 y.o.   MRN: [id number] HeartMate 2 Rounding Note  Subjective:    Sedated on vent. NO weaned to 2ppm  Remains on Milrinone 0.375 and levophed 3 mcg. CI 2.7, SVO2 71%, Co-ox 75%.   LVAD INTERROGATION:  HeartMate II LVAD:  Flow 4.1 liters/min, speed 9000, power 4.8, PI 4.5.    Objective:    Vital Signs:   Temp:  [97 F (36.1 C)-99.3 F (37.4 C)] 99.3 F (37.4 C) (05/09 0800) Pulse Rate:  [48-115] 48 (05/09 0800) Resp:  [0-35] 17 (05/09 0800) BP: (74-89)/(71-82) 87/76 mmHg (05/09 0800) SpO2:  [100 %] 100 % (05/09 0800) Arterial Line BP: (73-97)/(66-88) 76/69 mmHg (05/09 0800) FiO2 (%):  [50 %] 50 % (05/09 0600) Weight:  [77.2 kg (170 lb 3.1 oz)] 77.2 kg (170 lb 3.1 oz) (05/09 0430) Last BM Date: 11/17/15 Mean arterial Pressure 81  Intake/Output:   Intake/Output Summary (Last 24 hours) at 11/22/15 0828 Last data filed at 11/22/15 0800  Gross per 24 hour  Intake 6085.08 ml  Output   5590 ml  Net 495.08 ml     Physical Exam: General:  Sedated on vent Cor: distant heart sounds with LVAD hum present. Lungs: clear Abdomen: soft, nondistended. Good bowel sounds. Extremities: no cyanosis, clubbing, rash, edema Neuro: sedated but opens eyes  Telemetry: sinus  Labs: Basic Metabolic Panel:  Recent Labs Lab 11/19/15 0340 11/19/15 1417 11/20/15 0529 11/21/15 0415  11/21/15 1026 11/21/15 1219 11/21/15 1246 11/21/15 1404 11/21/15 1415 11/21/15 1605 11/21/15 1930 11/21/15 1933 11/22/15 0415  NA 134*  --  135 132*  < > 140 140 143 141  --  141  --  141 140  K 4.1  --  4.4 4.1  < > 3.8 3.7 3.2* 3.6  --  4.5  --  5.0 4.6  CL 103  --  101 100*  < > 103 102  --   --   --  112*  --  107 109  CO2 23  --  24 23  --   --   --   --   --   --  23  --   --  21*  GLUCOSE 175*  --  190* 221*  < > 123* 174*  --  118*  --  92  --  138* 108*  BUN 19  --  13 15  < > 11 15  --   --   --  10  --  12 10  CREATININE 1.04  --  1.11  1.00  < > 0.70 0.80  --   --   --  1.03 1.19 1.00 1.17  CALCIUM 8.8*  --  9.2 9.0  --   --   --   --   --   --  8.6*  --   --  8.4*  MG  --  1.6*  --   --   --   --   --   --   --  1.6*  --  2.6*  --  2.0  PHOS  --   --   --   --   --   --   --   --   --   --   --   --   --  3.4  < > = values in this interval not displayed.  Liver Function Tests:  Recent Labs Lab 11/16/15 0335 11/20/15 0529 11/22/15 8938  AST 58* 38 82*  ALT 70* 51 30  ALKPHOS 98 87 53  BILITOT 1.0 1.0 5.7*  PROT 7.5 7.4 5.6*  ALBUMIN 2.8* 2.6* 2.5*   No results for input(s): LIPASE, AMYLASE in the last 168 hours. No results for input(s): AMMONIA in the last 168 hours.  CBC:  Recent Labs Lab 11/20/15 0529 11/21/15 0415  11/21/15 1045  11/21/15 1404 11/21/15 1415 11/21/15 1535 11/21/15 1930 11/21/15 1933 11/22/15 0415  WBC 4.0 5.1  --   --   --   --  8.2  --  10.7*  --  7.5  NEUTROABS  --   --   --   --   --   --   --   --   --   --  5.4  HGB 10.4* 10.2*  < > 8.3*  < > 11.9* 10.6*  --  11.1* 12.6* 9.3*  HCT 32.1* 31.4*  < > 25.2*  < > 35.0* 32.4*  --  33.3* 37.0* 28.1*  MCV 103.2* 102.6*  --   --   --   --  99.7  --  100.0  --  97.9  PLT 314 325  --  210  --   --  155 152 167  --  156  < > = values in this interval not displayed.  INR:  Recent Labs Lab 11/17/15 0400 11/18/15 0300 11/19/15 0340 11/21/15 1535 11/22/15 0415  INR 1.27 1.21 1.30 1.61* 1.49    Other results:  EKG:   Imaging: Dg Chest Port 1 View  11/22/2015  CLINICAL DATA:  Left ventricular assist device in place, acute on chronic CHF, cardiogenic shock, NSTEMI EXAM: PORTABLE CHEST 1 VIEW COMPARISON:  Portable chest x-ray of Nov 21, 2015 FINDINGS: The lungs are adequately inflated. There is no focal infiltrate. The cardiac silhouette remains enlarged. The central pulmonary vascularity is prominent. The left ventricular assist device is visible at the inferior margin of the study. A Swan-Ganz catheter tip projects in the proximal  right descending pulmonary artery. A mediastinal drain projects at approximately the T8 vertebral body. The left-sided chest tube tip projects over the posterior aspect of the fourth rib. There may be a lower left chest tube which lies lateral to the left ventricular assist device at whose tip overlies the lateral aspect of the seventh rib. IMPRESSION: Mild interval improvement in the appearance of the pulmonary interstitium bilaterally. No significant pleural effusion or pneumothorax. The support tubes and devices are in stable position. Electronically Signed   By: David  Martinique M.D.   On: 11/22/2015 07:41   Dg Chest Port 1 View  11/21/2015  CLINICAL DATA:  Status post left ventricular assist device placement. History of congestive heart failure and hypertension. EXAM: PORTABLE CHEST 1 VIEW COMPARISON:  11/20/2015 and 11/17/2015. FINDINGS: 1437 hours. Endotracheal tube tip is in the midtrachea. A nasogastric tube projects below the diaphragm. There is a right IJ central venous catheter extending to the mid SVC level. There is also a right IJ Swan-Ganz catheter with its tip in the right lower lobe pulmonary artery. Left chest tube and mediastinal drain are in place. Left ventricular assist device overlies the cardiac apex with interval decrease in size of the heart status post median sternotomy. There is no pneumothorax or significant pleural effusion. Right perihilar airspace disease appears unchanged. There is mildly increased opacity in the left perihilar region. IMPRESSION: 1. Interval left ventricular assist device placement without complicating pneumothorax. 2. Mild increase in left perihilar airspace  disease. The right perihilar opacity appears unchanged. Electronically Signed   By: Richardean Sale M.D.   On: 11/21/2015 14:48      Medications:     Scheduled Medications: . acetaminophen  1,000 mg Oral Q6H   Or  . acetaminophen (TYLENOL) oral liquid 160 mg/5 mL  1,000 mg Per Tube Q6H  . antiseptic  oral rinse  7 mL Mouth Rinse QID  . aspirin EC  325 mg Oral Daily   Or  . aspirin  324 mg Per Tube Daily   Or  . aspirin  300 mg Rectal Daily  . bisacodyl  10 mg Oral Daily   Or  . bisacodyl  10 mg Rectal Daily  . cefUROXime (ZINACEF)  IV  1.5 g Intravenous Q12H  . chlorhexidine gluconate (SAGE KIT)  15 mL Mouth Rinse BID  . digoxin  0.125 mg Oral Daily  . docusate sodium  200 mg Oral Daily  . feeding supplement (ENSURE ENLIVE)  237 mL Oral BID BM  . insulin aspart  0-24 Units Subcutaneous Q4H  . insulin regular  0-10 Units Intravenous TID WC  . metoCLOPramide (REGLAN) injection  10 mg Intravenous Q6H  . [START ON 11/23/2015] pantoprazole  40 mg Oral Daily  . sodium chloride flush  10-40 mL Intracatheter Q12H  . sodium chloride flush  3 mL Intravenous Q12H  . vancomycin  1,250 mg Intravenous Q12H     Infusions: . sodium chloride 20 mL/hr at 11/21/15 2200  . sodium chloride    . sodium chloride 10 mL/hr at 11/21/15 1800  . dexmedetomidine 0.6 mcg/kg/hr (11/22/15 0800)  . insulin (NOVOLIN-R) infusion Stopped (11/21/15 1600)  . lactated ringers 20 mL (11/22/15 0314)  . lactated ringers 20 mL/hr at 11/21/15 2200  . milrinone 0.375 mcg/kg/min (11/22/15 0800)  . nitroGLYCERIN    . norepinephrine (LEVOPHED) Adult infusion 3 mcg/min (11/22/15 0800)  . phenylephrine (NEO-SYNEPHRINE) Adult infusion       PRN Medications:  sodium chloride, albumin human, lactated ringers, midazolam, morphine injection, ondansetron (ZOFRAN) IV, oxyCODONE, sodium chloride flush, sodium chloride flush, traMADol   Assessment:   POD 1 S/P HeartMate II LVAD  1. Non-ischemic cardiomyopathy with acute on chronic systolic CHF and cardiogenic shock preop. He had a stable night and levophed weaned. 2. Expected postop acute blood loss anemia 3. Small chronic laminated basilar anterior wall LV thrombus not removed    Plan/Discussion:    Wean vent to extubate to nasal NO.  Continue chest tubes to  suction today  Plan to start coumadin slowly tonight  Wean levophed as BP allows to keep MAP 70-80.  Should be able to remove swan later today after extubation  I reviewed the LVAD parameters from today, and compared the results to the patient's prior recorded data.  No programming changes were made.  The LVAD is functioning within specified parameters.  The patient performs LVAD self-test daily.  LVAD interrogation was negative for any significant power changes, alarms or PI events/speed drops.  LVAD equipment check completed and is in good working order.  Back-up equipment present.   LVAD education done on emergency procedures and precautions and reviewed exit site care.  Length of Stay: [place] 11/22/2015, 8:28 AM

## 2015-11-22 NOTE — Clinical Social Work Note (Signed)
CSW made Matthew Vaughan, CSW aware of consult for VAD. CSW signing off at this time.  [employer], LCSW [phone]

## 2015-11-22 NOTE — Progress Notes (Signed)
HeartMate 2 Rounding Note  Subjective:    POD 1 S/P HMII LVAD.   Remains on NO at 2 PPM, milrinone 0.375 mcg, and norepi 3 mcg.  CO-OX 75%.   Swan:  RA 9 PA 32/14 CI 2.7 Co-ox 71%  LVAD INTERROGATION:  HeartMate II LVAD:  Flow 3.9 liters/min, speed 9000, power 4.2, PI 4.7.  No PI events over night.   Objective:    Vital Signs:   Temp:  [97 F (36.1 C)-99.3 F (37.4 C)] 99.3 F (37.4 C) (05/09 0800) Pulse Rate:  [48-115] 48 (05/09 0800) Resp:  [0-35] 17 (05/09 0800) BP: (74-89)/(71-82) 87/76 mmHg (05/09 0800) SpO2:  [100 %] 100 % (05/09 0800) Arterial Line BP: (73-97)/(66-88) 76/69 mmHg (05/09 0800) FiO2 (%):  [50 %] 50 % (05/09 0600) Weight:  [170 lb 3.1 oz (77.2 kg)] 170 lb 3.1 oz (77.2 kg) (05/09 0430) Last BM Date: 11/17/15 Mean arterial Pressure 70-80s   Intake/Output:   Intake/Output Summary (Last 24 hours) at 11/22/15 0814 Last data filed at 11/22/15 0800  Gross per 24 hour  Intake 6085.08 ml  Output   5790 ml  Net 295.08 ml     Physical Exam: CVP 9-10  General:  Intubated.  HEENT: normal Neck: supple. JVP 9-10 . Carotids 2+ bilat; no bruits. No lymphadenopathy or thryomegaly appreciated. Cor: Mechanical heart sounds with LVAD hum present. Sternal dressing intact Lungs: clear Abdomen: soft, nontender, nondistended. No hepatosplenomegaly. No bruits or masses. Good bowel sounds. Driveline: C/D/I; securement device intact and driveline incorporated Extremities: no cyanosis, clubbing, rash, edema Neuro: Intubated sedated.   Telemetry: NSR 80s   Labs: Basic Metabolic Panel:  Recent Labs Lab 11/19/15 0340 11/19/15 1417 11/20/15 0529 11/21/15 0415  11/21/15 1026 11/21/15 1219 11/21/15 1246 11/21/15 1404 11/21/15 1415 11/21/15 1605 11/21/15 1930 11/21/15 1933 11/22/15 0415  NA 134*  --  135 132*  < > 140 140 143 141  --  141  --  141 140  K 4.1  --  4.4 4.1  < > 3.8 3.7 3.2* 3.6  --  4.5  --  5.0 4.6  CL 103  --  101 100*  < > 103 102  --    --   --  112*  --  107 109  CO2 23  --  24 23  --   --   --   --   --   --  23  --   --  21*  GLUCOSE 175*  --  190* 221*  < > 123* 174*  --  118*  --  92  --  138* 108*  BUN 19  --  13 15  < > 11 15  --   --   --  10  --  12 10  CREATININE 1.04  --  1.11 1.00  < > 0.70 0.80  --   --   --  1.03 1.19 1.00 1.17  CALCIUM 8.8*  --  9.2 9.0  --   --   --   --   --   --  8.6*  --   --  8.4*  MG  --  1.6*  --   --   --   --   --   --   --  1.6*  --  2.6*  --  2.0  PHOS  --   --   --   --   --   --   --   --   --   --   --   --   --  3.4  < > = values in this interval not displayed.  Liver Function Tests:  Recent Labs Lab 11/16/15 0335 11/20/15 0529 11/22/15 0415  AST 58* 38 82*  ALT 70* 51 30  ALKPHOS 98 87 53  BILITOT 1.0 1.0 5.7*  PROT 7.5 7.4 5.6*  ALBUMIN 2.8* 2.6* 2.5*   No results for input(s): LIPASE, AMYLASE in the last 168 hours. No results for input(s): AMMONIA in the last 168 hours.  CBC:  Recent Labs Lab 11/20/15 0529 11/21/15 0415  11/21/15 1045  11/21/15 1404 11/21/15 1415 11/21/15 1535 11/21/15 1930 11/21/15 1933 11/22/15 0415  WBC 4.0 5.1  --   --   --   --  8.2  --  10.7*  --  7.5  NEUTROABS  --   --   --   --   --   --   --   --   --   --  5.4  HGB 10.4* 10.2*  < > 8.3*  < > 11.9* 10.6*  --  11.1* 12.6* 9.3*  HCT 32.1* 31.4*  < > 25.2*  < > 35.0* 32.4*  --  33.3* 37.0* 28.1*  MCV 103.2* 102.6*  --   --   --   --  99.7  --  100.0  --  97.9  PLT 314 325  --  210  --   --  155 152 167  --  156  < > = values in this interval not displayed.  INR:  Recent Labs Lab 11/17/15 0400 11/18/15 0300 11/19/15 0340 11/21/15 1535 11/22/15 0415  INR 1.27 1.21 1.30 1.61* 1.49    Other results:  EKG:   Imaging: Dg Chest Port 1 View  11/22/2015  CLINICAL DATA:  Left ventricular assist device in place, acute on chronic CHF, cardiogenic shock, NSTEMI EXAM: PORTABLE CHEST 1 VIEW COMPARISON:  Portable chest x-ray of Nov 21, 2015 FINDINGS: The lungs are adequately  inflated. There is no focal infiltrate. The cardiac silhouette remains enlarged. The central pulmonary vascularity is prominent. The left ventricular assist device is visible at the inferior margin of the study. A Swan-Ganz catheter tip projects in the proximal right descending pulmonary artery. A mediastinal drain projects at approximately the T8 vertebral body. The left-sided chest tube tip projects over the posterior aspect of the fourth rib. There may be a lower left chest tube which lies lateral to the left ventricular assist device at whose tip overlies the lateral aspect of the seventh rib. IMPRESSION: Mild interval improvement in the appearance of the pulmonary interstitium bilaterally. No significant pleural effusion or pneumothorax. The support tubes and devices are in stable position. Electronically Signed   By: David  Martinique M.D.   On: 11/22/2015 07:41   Dg Chest Port 1 View  11/21/2015  CLINICAL DATA:  Status post left ventricular assist device placement. History of congestive heart failure and hypertension. EXAM: PORTABLE CHEST 1 VIEW COMPARISON:  11/20/2015 and 11/17/2015. FINDINGS: 1437 hours. Endotracheal tube tip is in the midtrachea. A nasogastric tube projects below the diaphragm. There is a right IJ central venous catheter extending to the mid SVC level. There is also a right IJ Swan-Ganz catheter with its tip in the right lower lobe pulmonary artery. Left chest tube and mediastinal drain are in place. Left ventricular assist device overlies the cardiac apex with interval decrease in size of the heart status post median sternotomy. There is no pneumothorax or significant pleural effusion. Right perihilar airspace disease appears unchanged. There is  mildly increased opacity in the left perihilar region. IMPRESSION: 1. Interval left ventricular assist device placement without complicating pneumothorax. 2. Mild increase in left perihilar airspace disease. The right perihilar opacity appears  unchanged. Electronically Signed   By: Richardean Sale M.D.   On: 11/21/2015 14:48      Medications:     Scheduled Medications: . acetaminophen  1,000 mg Oral Q6H   Or  . acetaminophen (TYLENOL) oral liquid 160 mg/5 mL  1,000 mg Per Tube Q6H  . antiseptic oral rinse  7 mL Mouth Rinse QID  . aspirin EC  325 mg Oral Daily   Or  . aspirin  324 mg Per Tube Daily   Or  . aspirin  300 mg Rectal Daily  . bisacodyl  10 mg Oral Daily   Or  . bisacodyl  10 mg Rectal Daily  . cefUROXime (ZINACEF)  IV  1.5 g Intravenous Q12H  . chlorhexidine gluconate (SAGE KIT)  15 mL Mouth Rinse BID  . digoxin  0.125 mg Oral Daily  . docusate sodium  200 mg Oral Daily  . feeding supplement (ENSURE ENLIVE)  237 mL Oral BID BM  . insulin aspart  0-24 Units Subcutaneous Q4H  . insulin regular  0-10 Units Intravenous TID WC  . metoCLOPramide (REGLAN) injection  10 mg Intravenous Q6H  . [START ON 11/23/2015] pantoprazole  40 mg Oral Daily  . rifampin  600 mg Oral Once  . sodium chloride flush  10-40 mL Intracatheter Q12H  . sodium chloride flush  3 mL Intravenous Q12H  . vancomycin  1,250 mg Intravenous Q12H     Infusions: . sodium chloride 20 mL/hr at 11/21/15 2200  . sodium chloride    . sodium chloride 10 mL/hr at 11/21/15 1800  . dexmedetomidine 0.6 mcg/kg/hr (11/22/15 0800)  . insulin (NOVOLIN-R) infusion Stopped (11/21/15 1600)  . lactated ringers 20 mL (11/22/15 0314)  . lactated ringers 20 mL/hr at 11/21/15 2200  . milrinone 0.375 mcg/kg/min (11/22/15 0800)  . nitroGLYCERIN    . norepinephrine (LEVOPHED) Adult infusion 3 mcg/min (11/22/15 0800)  . phenylephrine (NEO-SYNEPHRINE) Adult infusion       PRN Medications:  sodium chloride, albumin human, lactated ringers, midazolam, morphine injection, ondansetron (ZOFRAN) IV, oxyCODONE, sodium chloride flush, sodium chloride flush, traMADol   Assessment/Plan/Discussion  1. S/P HMII LVAD POD #1: Stable. Good cardiac output.  Remains on  norepi 3 mcg + milrinone 0.375 mcg. Hoping to extubate today. VAD parameters stable at 9000 rpm. Continue 325 mg aspirin until INR low threshold of goal then decrease to 81.  2. Acute/chronic systolic CHF -> cardiogenic shock: Nonischemic cardiomyopathy. UOP remains good.  3. LV thrombus: Small thrombus on MRI.  Surgery to determine start of warfarin.  4. Anemia: Expected blood loss from Surgery. Hemoglobin 9.3  I reviewed the LVAD parameters from today, and compared the results to the patient's prior recorded data.  No programming changes were made.  The LVAD is functioning within specified parameters.  The patient performs LVAD self-test daily.  LVAD interrogation was negative for any significant power changes, alarms or PI events/speed drops.  LVAD equipment check completed and is in good working order.  Back-up equipment present.   LVAD education done on emergency procedures and precautions and reviewed exit site care.  Length of Stay: [place] NP-C  11/22/2015, 8:14 AM  VAD Team --- VAD ISSUES ONLY--- Pager [phone] (7am - 7am)  Advanced Heart Failure Team  Pager [phone] (M-F; 7a - 4p)  Please contact [facility] for night-coverage after hours (4p -7a ) and weekends on [email or web address]  Patient seen with NP, agree with the above note.  He is doing well so far.  Hemodynamically stable, good cardiac output.  Making urine.  Titrating down norepinephrine.  Weaning vent, hoping to extubate today. Will keep on NO 2 ppm until extubated.    Loralie Champagne 11/22/2015 8:37 AM

## 2015-11-23 ENCOUNTER — Inpatient Hospital Stay (HOSPITAL_COMMUNITY): Payer: Medicaid Other

## 2015-11-23 ENCOUNTER — Other Ambulatory Visit: Payer: Self-pay

## 2015-11-23 LAB — COMPREHENSIVE METABOLIC PANEL
ALBUMIN: 2.5 g/dL — AB (ref 3.5–5.0)
ALK PHOS: 55 U/L (ref 38–126)
ALT: 23 U/L (ref 17–63)
ANION GAP: 9 (ref 5–15)
AST: 62 U/L — ABNORMAL HIGH (ref 15–41)
BILIRUBIN TOTAL: 9.8 mg/dL — AB (ref 0.3–1.2)
BUN: 10 mg/dL (ref 6–20)
CALCIUM: 8.8 mg/dL — AB (ref 8.9–10.3)
CO2: 21 mmol/L — ABNORMAL LOW (ref 22–32)
Chloride: 106 mmol/L (ref 101–111)
Creatinine, Ser: 1.1 mg/dL (ref 0.61–1.24)
GLUCOSE: 104 mg/dL — AB (ref 65–99)
Potassium: 4.4 mmol/L (ref 3.5–5.1)
Sodium: 136 mmol/L (ref 135–145)
TOTAL PROTEIN: 5.7 g/dL — AB (ref 6.5–8.1)

## 2015-11-23 LAB — CBC WITH DIFFERENTIAL/PLATELET
BASOS ABS: 0 10*3/uL (ref 0.0–0.1)
BASOS PCT: 0 %
Eosinophils Absolute: 0.3 10*3/uL (ref 0.0–0.7)
Eosinophils Relative: 2 %
HEMATOCRIT: 26 % — AB (ref 39.0–52.0)
Hemoglobin: 8.6 g/dL — ABNORMAL LOW (ref 13.0–17.0)
Lymphocytes Relative: 12 %
Lymphs Abs: 1.3 10*3/uL (ref 0.7–4.0)
MCH: 33.6 pg (ref 26.0–34.0)
MCHC: 33.1 g/dL (ref 30.0–36.0)
MCV: 101.6 fL — ABNORMAL HIGH (ref 78.0–100.0)
MONO ABS: 1 10*3/uL (ref 0.1–1.0)
Monocytes Relative: 9 %
NEUTROS ABS: 8.8 10*3/uL — AB (ref 1.7–7.7)
Neutrophils Relative %: 77 %
Platelets: 142 10*3/uL — ABNORMAL LOW (ref 150–400)
RBC: 2.56 MIL/uL — ABNORMAL LOW (ref 4.22–5.81)
RDW: 15.9 % — AB (ref 11.5–15.5)
WBC: 11.3 10*3/uL — ABNORMAL HIGH (ref 4.0–10.5)

## 2015-11-23 LAB — GLUCOSE, CAPILLARY
GLUCOSE-CAPILLARY: 107 mg/dL — AB (ref 65–99)
GLUCOSE-CAPILLARY: 96 mg/dL (ref 65–99)
Glucose-Capillary: 104 mg/dL — ABNORMAL HIGH (ref 65–99)

## 2015-11-23 LAB — PROTIME-INR
INR: 1.51 — ABNORMAL HIGH (ref 0.00–1.49)
Prothrombin Time: 18.2 seconds — ABNORMAL HIGH (ref 11.6–15.2)

## 2015-11-23 LAB — TYPE AND SCREEN
ABO/RH(D): A POS
Antibody Screen: NEGATIVE
Unit division: 0
Unit division: 0
Unit division: 0
Unit division: 0

## 2015-11-23 LAB — CARBOXYHEMOGLOBIN
Carboxyhemoglobin: 1.9 % — ABNORMAL HIGH (ref 0.5–1.5)
Methemoglobin: 0.9 % (ref 0.0–1.5)
O2 Saturation: 67.6 %
TOTAL HEMOGLOBIN: 8 g/dL — AB (ref 13.5–18.0)

## 2015-11-23 LAB — PHOSPHORUS: PHOSPHORUS: 5.1 mg/dL — AB (ref 2.5–4.6)

## 2015-11-23 LAB — POCT I-STAT 3, ART BLOOD GAS (G3+)
ACID-BASE DEFICIT: 4 mmol/L — AB (ref 0.0–2.0)
BICARBONATE: 22.2 meq/L (ref 20.0–24.0)
O2 Saturation: 99 %
TCO2: 24 mmol/L (ref 0–100)
pCO2 arterial: 45.2 mmHg — ABNORMAL HIGH (ref 35.0–45.0)
pH, Arterial: 7.3 — ABNORMAL LOW (ref 7.350–7.450)
pO2, Arterial: 145 mmHg — ABNORMAL HIGH (ref 80.0–100.0)

## 2015-11-23 LAB — MAGNESIUM: Magnesium: 1.7 mg/dL (ref 1.7–2.4)

## 2015-11-23 LAB — LACTATE DEHYDROGENASE: LDH: 282 U/L — AB (ref 98–192)

## 2015-11-23 MED ORDER — WARFARIN SODIUM 2.5 MG PO TABS
2.5000 mg | ORAL_TABLET | Freq: Once | ORAL | Status: AC
  Administered 2015-11-23: 2.5 mg via ORAL
  Filled 2015-11-23: qty 1

## 2015-11-23 MED ORDER — MAGNESIUM SULFATE 2 GM/50ML IV SOLN
2.0000 g | Freq: Once | INTRAVENOUS | Status: AC
  Administered 2015-11-23: 2 g via INTRAVENOUS
  Filled 2015-11-23: qty 50

## 2015-11-23 MED ORDER — WARFARIN - PHARMACIST DOSING INPATIENT
Freq: Every day | Status: DC
  Administered 2015-11-23: 18:00:00
  Administered 2015-11-24: 1
  Administered 2015-11-26 – 2015-12-04 (×4)

## 2015-11-23 NOTE — Progress Notes (Signed)
Patient ID: Matthew Vaughan, male   DOB: 16-Apr-1956, 60 y.o.   MRN: [id number]  HeartMate 2 Rounding Note  Subjective:    Had 10/10 pocket pain overnight and I gave him some toradol which helped. Feels better this am.  Remains on Milrinone 0.375 and NO 2 ppm. Levophed turned off this am.  Co-ox is 68% this am. He has mild metabolic acidosis with base deficit -4.  LVAD INTERROGATION:  HeartMate II LVAD:  Flow 4.5 liters/min, speed 9000, power 5.0, PI 5.3.    Objective:    Vital Signs:   Temp:  [97.5 F (36.4 C)-99.7 F (37.6 C)] 98.9 F (37.2 C) (05/10 0400) Pulse Rate:  [33-129] 108 (05/10 0800) Resp:  [14-36] 19 (05/10 0800) BP: (73-92)/(67-80) 79/71 mmHg (05/09 2100) SpO2:  [90 %-100 %] 99 % (05/10 0800) Arterial Line BP: (63-116)/(53-83) 78/70 mmHg (05/10 0800) Weight:  [79.2 kg (174 lb 9.7 oz)] 79.2 kg (174 lb 9.7 oz) (05/10 0500) Last BM Date: 11/20/15 Mean arterial Pressure 73  Intake/Output:   Intake/Output Summary (Last 24 hours) at 11/23/15 0820 Last data filed at 11/23/15 0800  Gross per 24 hour  Intake 1776.2 ml  Output   2025 ml  Net -248.8 ml     Physical Exam: General:  Well appearing. No resp difficulty HEENT: normal Cor: Normal heart sounds with LVAD hum present. Lungs: clear Abdomen: soft, nontender, nondistended. No hepatosplenomegaly. No bruits or masses. Good bowel sounds. Extremities: no cyanosis, clubbing, rash, edema Neuro: alert & orientedx3, cranial nerves grossly intact. moves all 4 extremities w/o difficulty. Affect pleasant  Telemetry: sinus tach 106  Labs: Basic Metabolic Panel:  Recent Labs Lab 11/20/15 0529 11/21/15 0415  11/21/15 1415 11/21/15 1605 11/21/15 1930 11/21/15 1933 11/22/15 0415 11/22/15 1527 11/22/15 1529 11/23/15 0355  NA 135 132*  < >  --  141  --  141 140 138  --  136  K 4.4 4.1  < >  --  4.5  --  5.0 4.6 4.4  --  4.4  CL 101 100*  < >  --  112*  --  107 109 103  --  106  CO2 24 23  --   --  23  --   --   21*  --   --  21*  GLUCOSE 190* 221*  < >  --  92  --  138* 108* 106*  --  104*  BUN 13 15  < >  --  10  --  12 10 10   --  10  CREATININE 1.11 1.00  < >  --  1.03 1.19 1.00 1.17 1.10 1.18 1.10  CALCIUM 9.2 9.0  --   --  8.6*  --   --  8.4*  --   --  8.8*  MG  --   --   --  1.6*  --  2.6*  --  2.0  --  1.9 1.7  PHOS  --   --   --   --   --   --   --  3.4  --   --  5.1*  < > = values in this interval not displayed.  Liver Function Tests:  Recent Labs Lab 11/20/15 0529 11/22/15 0415 11/23/15 0355  AST 38 82* 62*  ALT 51 30 23  ALKPHOS 87 53 55  BILITOT 1.0 5.7* 9.8*  PROT 7.4 5.6* 5.7*  ALBUMIN 2.6* 2.5* 2.5*   No results for input(s): LIPASE, AMYLASE in the last  168 hours. No results for input(s): AMMONIA in the last 168 hours.  CBC:  Recent Labs Lab 11/21/15 1415 11/21/15 1535 11/21/15 1930 11/21/15 1933 11/22/15 0415 11/22/15 1527 11/22/15 1529 11/23/15 0355  WBC 8.2  --  10.7*  --  7.5  --  9.1 11.3*  NEUTROABS  --   --   --   --  5.4  --   --  8.8*  HGB 10.6*  --  11.1* 12.6* 9.3* 10.2* 8.9* 8.6*  HCT 32.4*  --  33.3* 37.0* 28.1* 30.0* 27.3* 26.0*  MCV 99.7  --  100.0  --  97.9  --  100.0 101.6*  PLT 155 152 167  --  156  --  144* 142*    INR:  Recent Labs Lab 11/18/15 0300 11/19/15 0340 11/21/15 1535 11/22/15 0415 11/23/15 0355  INR 1.21 1.30 1.61* 1.49 1.51*   LDH: 282  Other results:  EKG:   Imaging: Dg Chest Port 1 View  11/23/2015  CLINICAL DATA:  Presence of left ventricular assist device. Chest tube in place. EXAM: PORTABLE CHEST 1 VIEW COMPARISON:  11/22/2015 FINDINGS: Endotracheal tube and nasogastric tube have been removed. There continues to be a mediastinal and left chest tube. Negative for pneumothorax. Swan-Ganz catheter has been removed but there continues to be 2 right jugular central lines. Central line tips are in the region of the upper SVC and right innominate vein. There is stable cardiomegaly. LVAD is noted. Few linear densities  in the periphery of the right upper lung may represent atelectasis, otherwise, the lungs are clear. No definite pulmonary edema. IMPRESSION: Support apparatuses as described. Left chest tubes are still present and no evidence for pneumothorax. LVAD with cardiomegaly. No pulmonary edema. Electronically Signed   By: Richarda Overlie M.D.   On: 11/23/2015 07:54   Dg Chest Port 1 View  11/22/2015  CLINICAL DATA:  Left ventricular assist device in place, acute on chronic CHF, cardiogenic shock, NSTEMI EXAM: PORTABLE CHEST 1 VIEW COMPARISON:  Portable chest x-ray of Nov 21, 2015 FINDINGS: The lungs are adequately inflated. There is no focal infiltrate. The cardiac silhouette remains enlarged. The central pulmonary vascularity is prominent. The left ventricular assist device is visible at the inferior margin of the study. A Swan-Ganz catheter tip projects in the proximal right descending pulmonary artery. A mediastinal drain projects at approximately the T8 vertebral body. The left-sided chest tube tip projects over the posterior aspect of the fourth rib. There may be a lower left chest tube which lies lateral to the left ventricular assist device at whose tip overlies the lateral aspect of the seventh rib. IMPRESSION: Mild interval improvement in the appearance of the pulmonary interstitium bilaterally. No significant pleural effusion or pneumothorax. The support tubes and devices are in stable position. Electronically Signed   By: David  [place]iland M.D.   On: 11/22/2015 07:41   Dg Chest Port 1 View  11/21/2015  CLINICAL DATA:  Status post left ventricular assist device placement. History of congestive heart failure and hypertension. EXAM: PORTABLE CHEST 1 VIEW COMPARISON:  11/20/2015 and 11/17/2015. FINDINGS: 1437 hours. Endotracheal tube tip is in the midtrachea. A nasogastric tube projects below the diaphragm. There is a right IJ central venous catheter extending to the mid SVC level. There is also a right IJ Swan-Ganz  catheter with its tip in the right lower lobe pulmonary artery. Left chest tube and mediastinal drain are in place. Left ventricular assist device overlies the cardiac apex with interval decrease in  size of the heart status post median sternotomy. There is no pneumothorax or significant pleural effusion. Right perihilar airspace disease appears unchanged. There is mildly increased opacity in the left perihilar region. IMPRESSION: 1. Interval left ventricular assist device placement without complicating pneumothorax. 2. Mild increase in left perihilar airspace disease. The right perihilar opacity appears unchanged. Electronically Signed   By: Carey Bullocks M.D.   On: 11/21/2015 14:48      Medications:     Scheduled Medications: . acetaminophen  1,000 mg Oral Q6H   Or  . acetaminophen (TYLENOL) oral liquid 160 mg/5 mL  1,000 mg Per Tube Q6H  . antiseptic oral rinse  7 mL Mouth Rinse BID  . aspirin EC  325 mg Oral Daily   Or  . aspirin  324 mg Per Tube Daily   Or  . aspirin  300 mg Rectal Daily  . bisacodyl  10 mg Oral Daily   Or  . bisacodyl  10 mg Rectal Daily  . digoxin  0.125 mg Oral Daily  . docusate sodium  200 mg Oral Daily  . feeding supplement (ENSURE ENLIVE)  237 mL Oral BID BM  . insulin aspart  0-24 Units Subcutaneous Q4H  . insulin regular  0-10 Units Intravenous TID WC  . metoCLOPramide (REGLAN) injection  10 mg Intravenous Q6H  . pantoprazole  40 mg Oral Daily  . sodium chloride flush  10-40 mL Intracatheter Q12H  . sodium chloride flush  3 mL Intravenous Q12H  . Warfarin - Physician Dosing Inpatient   Does not apply q1800     Infusions: . sodium chloride Stopped (11/22/15 1500)  . sodium chloride    . sodium chloride Stopped (11/22/15 1500)  . dexmedetomidine Stopped (11/22/15 0900)  . insulin (NOVOLIN-R) infusion Stopped (11/21/15 1600)  . lactated ringers 20 mL (11/22/15 0314)  . lactated ringers 20 mL/hr at 11/21/15 2200  . milrinone 0.375 mcg/kg/min  (11/23/15 0800)  . nitroGLYCERIN    . norepinephrine (LEVOPHED) Adult infusion Stopped (11/23/15 0600)  . phenylephrine (NEO-SYNEPHRINE) Adult infusion       PRN Medications:  sodium chloride, lactated ringers, midazolam, morphine injection, ondansetron (ZOFRAN) IV, oxyCODONE, sodium chloride flush, sodium chloride flush, traMADol   Assessment:   POD 2 S/P HeartMate II LVAD  1. Non-ischemic cardiomyopathy with acute on chronic systolic CHF and cardiogenic shock preop. He had a stable night and levophed weaned. 2. Expected postop acute blood loss anemia 3. Small chronic laminated basilar anterior wall LV thrombus not removed    Plan/Discussion:    He is doing well and off levophed. Will wean off NO today since this may be affecting MAP. Continue Milrinone at present dose for now.  Chest tube output 910 cc altogether yesterday but serous. It has decreased overnight. Will keep chest tubes in today.  Coumadin per pharmacy today. Want INR to rise slowly.   DC arterial line  Mobilize out of bed.   Weight is 16 lbs over preop but would hold off on diuresis for now with CVP 7 and borderline MAP.  I reviewed the LVAD parameters from today, and compared the results to the patient's prior recorded data.  No programming changes were made.  The LVAD is functioning within specified parameters.  The patient performs LVAD self-test daily.  LVAD interrogation was negative for any significant power changes, alarms or PI events/speed drops.  LVAD equipment check completed and is in good working order.  Back-up equipment present.   LVAD education done on emergency  procedures and precautions and reviewed exit site care.  Length of Stay: [address]  Payton Doughty Bartle 11/23/2015, 8:20 AM

## 2015-11-23 NOTE — Progress Notes (Signed)
Patient ID: Matthew Vaughan, male   DOB: 1956/06/03, 60 y.o.   MRN: [id number] HeartMate 2 Rounding Note  Subjective:    POD 2 S/P HMII LVAD.  Extubated 5/9 and swan removed.   Remains on NO at 2 PPM, milrinone 0.375 mcg.  Weaned off norepinephrine.  CO-OX 68%.   He is now on warfarin.   HR higher this morning around 110.  ECG shows sinus tachycardia.  He is awake/alert, sore but feeling better compared to yesterday.   LVAD INTERROGATION:  HeartMate II LVAD:  Flow 5 liters/min, speed 9000, power 5.2, PI 5.5.  No PI events over night.   Objective:    Vital Signs:   Temp:  [97.5 F (36.4 C)-99.7 F (37.6 C)] 98.9 F (37.2 C) (05/10 0400) Pulse Rate:  [33-129] 108 (05/10 0800) Resp:  [14-36] 19 (05/10 0800) BP: (73-92)/(67-80) 79/71 mmHg (05/09 2100) SpO2:  [90 %-100 %] 99 % (05/10 0800) Arterial Line BP: (63-116)/(53-83) 78/70 mmHg (05/10 0800) Weight:  [174 lb 9.7 oz (79.2 kg)] 174 lb 9.7 oz (79.2 kg) (05/10 0500) Last BM Date: 11/20/15 Mean arterial Pressure 70-80s   Intake/Output:   Intake/Output Summary (Last 24 hours) at 11/23/15 0805 Last data filed at 11/23/15 0700  Gross per 24 hour  Intake 1748.2 ml  Output   2010 ml  Net -261.8 ml     Physical Exam: CVP 7 General:  Intubated.  HEENT: normal Neck: supple. JVP 8. Carotids 2+ bilat; no bruits. No lymphadenopathy or thryomegaly appreciated. Cor: Mechanical heart sounds with LVAD hum present. Sternal dressing intact Lungs: clear Abdomen: soft, nontender, nondistended. No hepatosplenomegaly. No bruits or masses. Good bowel sounds. Driveline: C/D/I; securement device intact and driveline incorporated Extremities: no cyanosis, clubbing, rash, edema Neuro: Intubated sedated.   Telemetry: Sinus tachy around 110  Labs: Basic Metabolic Panel:  Recent Labs Lab 11/20/15 0529 11/21/15 0415  11/21/15 1415 11/21/15 1605 11/21/15 1930 11/21/15 1933 11/22/15 0415 11/22/15 1527 11/22/15 1529 11/23/15 0355  NA  135 132*  < >  --  141  --  141 140 138  --  136  K 4.4 4.1  < >  --  4.5  --  5.0 4.6 4.4  --  4.4  CL 101 100*  < >  --  112*  --  107 109 103  --  106  CO2 24 23  --   --  23  --   --  21*  --   --  21*  GLUCOSE 190* 221*  < >  --  92  --  138* 108* 106*  --  104*  BUN 13 15  < >  --  10  --  12 10 10   --  10  CREATININE 1.11 1.00  < >  --  1.03 1.19 1.00 1.17 1.10 1.18 1.10  CALCIUM 9.2 9.0  --   --  8.6*  --   --  8.4*  --   --  8.8*  MG  --   --   --  1.6*  --  2.6*  --  2.0  --  1.9 1.7  PHOS  --   --   --   --   --   --   --  3.4  --   --  5.1*  < > = values in this interval not displayed.  Liver Function Tests:  Recent Labs Lab 11/20/15 0529 11/22/15 0415 11/23/15 0355  AST 38 82* 62*  ALT 51  30 23  ALKPHOS 87 53 55  BILITOT 1.0 5.7* 9.8*  PROT 7.4 5.6* 5.7*  ALBUMIN 2.6* 2.5* 2.5*   No results for input(s): LIPASE, AMYLASE in the last 168 hours. No results for input(s): AMMONIA in the last 168 hours.  CBC:  Recent Labs Lab 11/21/15 1415 11/21/15 1535 11/21/15 1930 11/21/15 1933 11/22/15 0415 11/22/15 1527 11/22/15 1529 11/23/15 0355  WBC 8.2  --  10.7*  --  7.5  --  9.1 11.3*  NEUTROABS  --   --   --   --  5.4  --   --  8.8*  HGB 10.6*  --  11.1* 12.6* 9.3* 10.2* 8.9* 8.6*  HCT 32.4*  --  33.3* 37.0* 28.1* 30.0* 27.3* 26.0*  MCV 99.7  --  100.0  --  97.9  --  100.0 101.6*  PLT 155 152 167  --  156  --  144* 142*    INR:  Recent Labs Lab 11/18/15 0300 11/19/15 0340 11/21/15 1535 11/22/15 0415 11/23/15 0355  INR 1.21 1.30 1.61* 1.49 1.51*    Other results:  EKG:   Imaging: Dg Chest Port 1 View  11/23/2015  CLINICAL DATA:  Presence of left ventricular assist device. Chest tube in place. EXAM: PORTABLE CHEST 1 VIEW COMPARISON:  11/22/2015 FINDINGS: Endotracheal tube and nasogastric tube have been removed. There continues to be a mediastinal and left chest tube. Negative for pneumothorax. Swan-Ganz catheter has been removed but there continues  to be 2 right jugular central lines. Central line tips are in the region of the upper SVC and right innominate vein. There is stable cardiomegaly. LVAD is noted. Few linear densities in the periphery of the right upper lung may represent atelectasis, otherwise, the lungs are clear. No definite pulmonary edema. IMPRESSION: Support apparatuses as described. Left chest tubes are still present and no evidence for pneumothorax. LVAD with cardiomegaly. No pulmonary edema. Electronically Signed   By: Richarda Overlie M.D.   On: 11/23/2015 07:54   Dg Chest Port 1 View  11/22/2015  CLINICAL DATA:  Left ventricular assist device in place, acute on chronic CHF, cardiogenic shock, NSTEMI EXAM: PORTABLE CHEST 1 VIEW COMPARISON:  Portable chest x-ray of Nov 21, 2015 FINDINGS: The lungs are adequately inflated. There is no focal infiltrate. The cardiac silhouette remains enlarged. The central pulmonary vascularity is prominent. The left ventricular assist device is visible at the inferior margin of the study. A Swan-Ganz catheter tip projects in the proximal right descending pulmonary artery. A mediastinal drain projects at approximately the T8 vertebral body. The left-sided chest tube tip projects over the posterior aspect of the fourth rib. There may be a lower left chest tube which lies lateral to the left ventricular assist device at whose tip overlies the lateral aspect of the seventh rib. IMPRESSION: Mild interval improvement in the appearance of the pulmonary interstitium bilaterally. No significant pleural effusion or pneumothorax. The support tubes and devices are in stable position. Electronically Signed   By: David  [place] M.D.   On: 11/22/2015 07:41   Dg Chest Port 1 View  11/21/2015  CLINICAL DATA:  Status post left ventricular assist device placement. History of congestive heart failure and hypertension. EXAM: PORTABLE CHEST 1 VIEW COMPARISON:  11/20/2015 and 11/17/2015. FINDINGS: 1437 hours. Endotracheal tube tip is in  the midtrachea. A nasogastric tube projects below the diaphragm. There is a right IJ central venous catheter extending to the mid SVC level. There is also a right IJ Swan-Ganz catheter  with its tip in the right lower lobe pulmonary artery. Left chest tube and mediastinal drain are in place. Left ventricular assist device overlies the cardiac apex with interval decrease in size of the heart status post median sternotomy. There is no pneumothorax or significant pleural effusion. Right perihilar airspace disease appears unchanged. There is mildly increased opacity in the left perihilar region. IMPRESSION: 1. Interval left ventricular assist device placement without complicating pneumothorax. 2. Mild increase in left perihilar airspace disease. The right perihilar opacity appears unchanged. Electronically Signed   By: Carey Bullocks M.D.   On: 11/21/2015 14:48     Medications:     Scheduled Medications: . acetaminophen  1,000 mg Oral Q6H   Or  . acetaminophen (TYLENOL) oral liquid 160 mg/5 mL  1,000 mg Per Tube Q6H  . antiseptic oral rinse  7 mL Mouth Rinse BID  . aspirin EC  325 mg Oral Daily   Or  . aspirin  324 mg Per Tube Daily   Or  . aspirin  300 mg Rectal Daily  . bisacodyl  10 mg Oral Daily   Or  . bisacodyl  10 mg Rectal Daily  . digoxin  0.125 mg Oral Daily  . docusate sodium  200 mg Oral Daily  . feeding supplement (ENSURE ENLIVE)  237 mL Oral BID BM  . insulin aspart  0-24 Units Subcutaneous Q4H  . insulin regular  0-10 Units Intravenous TID WC  . metoCLOPramide (REGLAN) injection  10 mg Intravenous Q6H  . pantoprazole  40 mg Oral Daily  . sodium chloride flush  10-40 mL Intracatheter Q12H  . sodium chloride flush  3 mL Intravenous Q12H  . Warfarin - Physician Dosing Inpatient   Does not apply q1800    Infusions: . sodium chloride Stopped (11/22/15 1500)  . sodium chloride    . sodium chloride Stopped (11/22/15 1500)  . dexmedetomidine Stopped (11/22/15 0900)  . insulin  (NOVOLIN-R) infusion Stopped (11/21/15 1600)  . lactated ringers 20 mL (11/22/15 0314)  . lactated ringers 20 mL/hr at 11/21/15 2200  . milrinone 0.375 mcg/kg/min (11/23/15 0700)  . nitroGLYCERIN    . norepinephrine (LEVOPHED) Adult infusion Stopped (11/23/15 0600)  . phenylephrine (NEO-SYNEPHRINE) Adult infusion      PRN Medications: sodium chloride, lactated ringers, midazolam, morphine injection, ondansetron (ZOFRAN) IV, oxyCODONE, sodium chloride flush, sodium chloride flush, traMADol   Assessment/Plan/Discussion   1. S/P HMII LVAD POD #2: Stable. Good co-ox at 67%, CVP 7.  He is now off norepinephrine and remains on milrinone 0.375.  MAP at goal in 70s. VAD parameters stable at 9000 rpm.  - Continue 325 mg aspirin until INR low threshold of goal then decrease to 81.  - Continue milrinone at 0.375 today.  - Should be able to stop NO today.  2. Acute/chronic systolic CHF -> cardiogenic shock: Nonischemic cardiomyopathy. I/Os even with good UOP, CVP 7.  3. LV thrombus: Small thrombus on MRI.  Warfarin restarted, goal INR 2-2.5.  4. Anemia: Expected blood loss from Surgery. Hemoglobin 8.6.  5. Rhythm: He appears to be in sinus tachycardia by echo this morning, HR around 110.   I reviewed the LVAD parameters from today, and compared the results to the patient's prior recorded data.  No programming changes were made.  The LVAD is functioning within specified parameters.  The patient performs LVAD self-test daily.  LVAD interrogation was negative for any significant power changes, alarms or PI events/speed drops.  LVAD equipment check completed and is in  good working order.  Back-up equipment present.   LVAD education done on emergency procedures and precautions and reviewed exit site care.  Length of Stay: [id number]  [name]  11/23/2015, 8:05 AM  VAD Team --- VAD ISSUES ONLY--- Pager [phone] (7am - 7am)  Advanced Heart Failure Team  Pager [phone] (M-F; 7a - 4p)  Please contact  [facility] for night-coverage after hours (4p -7a ) and weekends on [email or web address]

## 2015-11-23 NOTE — Evaluation (Signed)
Physical Therapy Evaluation Patient Details Name: Matthew Vaughan MRN: [id number] DOB: 12/06/1955 Today's Date: 11/23/2015   History of Present Illness  60 y.o. male. Residence is in [place] but was visiting family in [place] when he came to hospital. Hx non-ischemic CM. EF ~ 20% historically. HTN. Chronic Coumadin for hx LV thrombus. S/p cath in 2015. Presents with Non-ischemic cardiomyopathy with acute on chronic systolic CHF and cardiogenic shock. Patient is now s/p LVAD implantation.  Clinical Impression  Patient demonstrates deficits in functional mobility as indicated below. Will need continued skilled PT to address deficits and maximize function. Will see as indicated and progress as tolerated.  Patient demonstrates understanding of device, was able to perform transition of power source and perform self test. Initiated education regarding LVAD precautions, patient very receptive.     Follow Up Recommendations Home health PT;Supervision/Assistance - 24 hour    Equipment Recommendations   (rollator)    Recommendations for Other Services       Precautions / Restrictions Precautions Precautions: Fall;Sternal Precaution Comments: LVAD, chest tubes x2, Nitric, sternal Restrictions Weight Bearing Restrictions:  (sternal)      Mobility  Bed Mobility Overal bed mobility: Needs Assistance;+2 for physical assistance Bed Mobility: Supine to Sit     Supine to sit: Min assist;+2 for physical assistance;HOB elevated     General bed mobility comments: Min assist to elevate trunk and rotate to EOB  Transfers Overall transfer level: Needs assistance Equipment used: None Transfers: Sit to/from [employer] Sit to Stand: Min assist;+2 physical assistance;+2 safety/equipment;From elevated surface Stand pivot transfers: Min assist;+2 physical assistance;+2 safety/equipment;From elevated surface       General transfer comment: Min assist for elevation to upright,  assist for stability  Ambulation/Gait             General Gait Details: deferred due to nitric   Stairs            Wheelchair Mobility    Modified Rankin (Stroke Patients Only)       Balance     Sitting balance-Leahy Scale: Fair       Standing balance-Leahy Scale: Fair                               Pertinent Vitals/Pain Pain Assessment: Faces Faces Pain Scale: Hurts even more Pain Location: chest and abdomen Pain Descriptors / Indicators: Discomfort;Grimacing;Guarding Pain Intervention(s): Monitored during session;Premedicated before session;Repositioned    Home Living Family/patient expects to be discharged to:: Private residence Living Arrangements: Alone Available Help at Discharge: Family;Available 24 hours/day (going to stay a brother's home) Type of Home: Apartment Home Access: Stairs to enter Entrance Stairs-Rails: Can reach both Entrance Stairs-Number of Steps: 6 Home Layout: One level Home Equipment: None      Prior Function Level of Independence: Independent               Hand Dominance   Dominant Hand: Right    Extremity/Trunk Assessment   Upper Extremity Assessment: Overall WFL for tasks assessed           Lower Extremity Assessment: Overall WFL for tasks assessed      Cervical / Trunk Assessment: Normal  Communication   Communication: No difficulties  Cognition Arousal/Alertness: Awake/alert Behavior During Therapy: WFL for tasks assessed/performed Overall Cognitive Status: Within Functional Limits for tasks assessed  General Comments      Exercises        Assessment/Plan    PT Assessment Patient needs continued PT services  PT Diagnosis Difficulty walking;Abnormality of gait;Generalized weakness;Acute pain   PT Problem List Decreased strength;Decreased activity tolerance;Decreased balance;Decreased mobility;Decreased coordination;Decreased safety  awareness;Cardiopulmonary status limiting activity;Pain  PT Treatment Interventions DME instruction;Gait training;Stair training;Functional mobility training;Therapeutic activities;Therapeutic exercise;Balance training;Patient/family education   PT Goals (Current goals can be found in the Care Plan section) Acute Rehab PT Goals Patient Stated Goal: Go fishing. PT Goal Formulation: With patient Time For Goal Achievement: 12/07/15 Potential to Achieve Goals: Good    Frequency Min 4X/week   Barriers to discharge        Co-evaluation               End of Session Equipment Utilized During Treatment: Oxygen;Other (comment) (LVAD equipment) Activity Tolerance: Patient tolerated treatment well Patient left: in chair;with call bell/phone within reach Nurse Communication: Mobility status         Time: 1610-9604 PT Time Calculation (min) (ACUTE ONLY): 43 min   Charges:   PT Evaluation $PT Eval High Complexity: 1 Procedure     PT G CodesFabio Asa 12/01/15, 5:12 PM  Charlotte Crumb, PT DPT  [phone]

## 2015-11-23 NOTE — Progress Notes (Signed)
Patient had a 6 beat run of Vtach, VS stable, patient sitting in chair, Dr. Laneta Simmers made aware, no new orders.  Hermina Barters, RN

## 2015-11-23 NOTE — Progress Notes (Signed)
ANTICOAGULATION CONSULT NOTE - Follow Up Consult  Pharmacy Consult for Warfarin Indication: LV thrombus / LVAD  No Known Allergies  Patient Measurements: Height: 5\' 11"  (180.3 cm) Weight: 174 lb 9.7 oz (79.2 kg) IBW/kg (Calculated) : 75.3  Vital Signs: Temp: 97.7 F (36.5 C) (05/10 1158) Temp Source: Oral (05/10 1158) Pulse Rate: 101 (05/10 1400)  Labs:  Recent Labs  11/21/15 0408 11/21/15 0415  11/21/15 1535  11/22/15 0415 11/22/15 1527 11/22/15 1529 11/23/15 0355  HGB  --  10.2*  < >  --   < > 9.3* 10.2* 8.9* 8.6*  HCT  --  31.4*  < >  --   < > 28.1* 30.0* 27.3* 26.0*  PLT  --  325  < > 152  < > 156  --  144* 142*  APTT 93*  --   --  37  --   --   --   --   --   LABPROT  --   --   --  19.2*  --  18.1*  --   --  18.2*  INR  --   --   --  1.61*  --  1.49  --   --  1.51*  HEPARINUNFRC  --  0.42  --   --   --   --   --   --   --   CREATININE  --  1.00  < >  --   < > 1.17 1.10 1.18 1.10  < > = values in this interval not displayed.  Estimated Creatinine Clearance: 78 mL/min (by C-G formula based on Cr of 1.1).  Assessment: 60 yo M on warfarin PTA for hx LV thrombus, transitioned to IV heparin on admission pending cath. S/P cath 4/24, found to have NICM, resumed heparin as bridge to LVAD S/P  LVAD on 5/8. Warfarin resumed 5/9 after discussion with MD INR 1.5 H/H slightly lower today will watch and titrate warfarin slowly  Goal of Therapy:  INR 2-2.5 Monitor platelets by anticoagulation protocol: Yes   Plan:   Warfarin 2.5mg  x1 Daily CBC, protime  Leota Sauers Pharm.D. CPP, BCPS Clinical Pharmacist [phone] 11/23/2015 2:40 PM

## 2015-11-23 NOTE — Evaluation (Signed)
Occupational Therapy Evaluation Patient Details Name: Matthew Vaughan MRN: [id number] DOB: [birth year] Today's Date: 11/23/2015    History of Present Illness 60 y.o. male. Residence is in [place] but was visiting family in [place] when he came to hospital. Hx non-ischemic CM. EF ~ 20% historically. HTN. Chronic Coumadin for hx LV thrombus. S/p cath in 2015. Presents with Non-ischemic cardiomyopathy with acute on chronic systolic CHF and cardiogenic shock. Patient is now s/p LVAD implantation.   Clinical Impression   Pt was independent prior to admission. Presents with expected pain and generalized weakness. Requires +2 physical assist for mobility and for management of lines and equipment and min to max assist for ADL. Began education in managing equipment needs and sternal precautions. Anticipate pt will progress to home with assistance of his supportive brother, sister and mother to his brother's apartment. Will follow acutely.    Follow Up Recommendations  Home health OT;Supervision/Assistance - 24 hour    Equipment Recommendations  3 in 1 bedside comode    Recommendations for Other Services       Precautions / Restrictions Precautions Precautions: Fall;Sternal Precaution Comments: LVAD, chest tubes x2, Nitric, sternal Restrictions Weight Bearing Restrictions:  (sternal)      Mobility Bed Mobility Overal bed mobility: Needs Assistance;+2 for physical assistance Bed Mobility: Supine to Sit     Supine to sit: Min assist;+2 for physical assistance;HOB elevated     General bed mobility comments: Min assist to elevate trunk and rotate to EOB  Transfers Overall transfer level: Needs assistance Equipment used: None Transfers: Sit to/from [employer] Sit to Stand: Min assist;+2 physical assistance;+2 safety/equipment;From elevated surface Stand pivot transfers: Min assist;+2 physical assistance;+2 safety/equipment;From elevated surface       General  transfer comment: Min assist for elevation to upright, assist for stability    Balance Overall balance assessment: Needs assistance   Sitting balance-Leahy Scale: Fair       Standing balance-Leahy Scale: Fair                              ADL Overall ADL's : Needs assistance/impaired Eating/Feeding: Independent;Sitting   Grooming: Set up;Sitting;Wash/dry hands;Wash/dry face   Upper Body Bathing: Moderate assistance;Sitting   Lower Body Bathing: Maximal assistance;Sit to/from stand   Upper Body Dressing : Minimal assistance;Sitting   Lower Body Dressing: Maximal assistance;Sit to/from stand   Toilet Transfer: Minimal assistance;+2 for safety/equipment;+2 for physical assistance;Stand-pivot Toilet Transfer Details (indicate cue type and reason): simulated to recliner           General ADL Comments: Began education in changing power sources and items to keep in black bag.      Vision     Perception     Praxis      Pertinent Vitals/Pain Pain Assessment: Faces Faces Pain Scale: Hurts even more Pain Location: chest and abdomen Pain Descriptors / Indicators: Discomfort;Grimacing;Guarding Pain Intervention(s): Monitored during session;Premedicated before session;Repositioned     Hand Dominance Right   Extremity/Trunk Assessment Upper Extremity Assessment Upper Extremity Assessment: Overall WFL for tasks assessed   Lower Extremity Assessment Lower Extremity Assessment: Defer to PT evaluation   Cervical / Trunk Assessment Cervical / Trunk Assessment: Normal   Communication Communication Communication: No difficulties   Cognition Arousal/Alertness: Awake/alert Behavior During Therapy: WFL for tasks assessed/performed Overall Cognitive Status: Within Functional Limits for tasks assessed  General Comments       Exercises       Shoulder Instructions      Home Living Family/patient expects to be discharged to::  Private residence Living Arrangements: Alone Available Help at Discharge: Family;Available 24 hours/day (going to stay a brother's home) Type of Home: Apartment Home Access: Stairs to enter Entrance Stairs-Number of Steps: 6 Entrance Stairs-Rails: Can reach both Home Layout: One level     Bathroom Shower/Tub: Chief Strategy Officer: Standard     Home Equipment: None          Prior Functioning/Environment Level of Independence: Independent             OT Diagnosis: Generalized weakness;Acute pain   OT Problem List: Decreased strength;Decreased activity tolerance;Impaired balance (sitting and/or standing);Decreased knowledge of use of DME or AE;Decreased knowledge of precautions;Cardiopulmonary status limiting activity;Pain   OT Treatment/Interventions: Self-care/ADL training;Energy conservation;DME and/or AE instruction;Balance training;Patient/family education;Therapeutic activities    OT Goals(Current goals can be found in the care plan section) Acute Rehab OT Goals Patient Stated Goal: Go fishing. OT Goal Formulation: With patient Time For Goal Achievement: 12/07/15 Potential to Achieve Goals: Good ADL Goals Pt Will Perform Grooming: with supervision;standing Pt Will Perform Upper Body Bathing: with supervision;sitting Pt Will Perform Lower Body Bathing: with supervision;sit to/from stand Pt Will Perform Upper Body Dressing: with supervision;sitting Pt Will Perform Lower Body Dressing: with supervision;sit to/from stand Pt Will Transfer to Toilet: with supervision;ambulating;bedside commode (over toilet) Pt Will Perform Toileting - Clothing Manipulation and hygiene: with supervision;sit to/from stand Additional ADL Goal #1: Pt will manage LVAD equipment independently. Additional ADL Goal #2: Pt will utilize energy conservation strategies independently.  OT Frequency: Min 2X/week   Barriers to D/C:            Co-evaluation              End  of Session Equipment Utilized During Treatment: Oxygen (LVAD) Nurse Communication: Mobility status  Activity Tolerance: Patient limited by pain;Patient limited by fatigue Patient left: in chair;with call bell/phone within reach;with nursing/sitter in room   Time: 0930-1013 OT Time Calculation (min): 43 min Charges:  OT General Charges $OT Visit: 1 Procedure OT Evaluation $OT Eval High Complexity: 1 Procedure G-Codes:    Evern Bio 11/23/2015, 1:17 PM [phone]

## 2015-11-24 ENCOUNTER — Telehealth: Payer: Self-pay | Admitting: Licensed Clinical Social Worker

## 2015-11-24 ENCOUNTER — Inpatient Hospital Stay (HOSPITAL_COMMUNITY): Payer: Medicaid Other

## 2015-11-24 LAB — CBC WITH DIFFERENTIAL/PLATELET
BASOS ABS: 0 10*3/uL (ref 0.0–0.1)
BASOS PCT: 0 %
EOS ABS: 0.2 10*3/uL (ref 0.0–0.7)
Eosinophils Relative: 2 %
HCT: 26.3 % — ABNORMAL LOW (ref 39.0–52.0)
Hemoglobin: 8.8 g/dL — ABNORMAL LOW (ref 13.0–17.0)
Lymphocytes Relative: 13 %
Lymphs Abs: 1.2 10*3/uL (ref 0.7–4.0)
MCH: 34 pg (ref 26.0–34.0)
MCHC: 33.5 g/dL (ref 30.0–36.0)
MCV: 101.5 fL — ABNORMAL HIGH (ref 78.0–100.0)
MONO ABS: 0.9 10*3/uL (ref 0.1–1.0)
MONOS PCT: 9 %
NEUTROS PCT: 76 %
Neutro Abs: 7.1 10*3/uL (ref 1.7–7.7)
PLATELETS: 165 10*3/uL (ref 150–400)
RBC: 2.59 MIL/uL — ABNORMAL LOW (ref 4.22–5.81)
RDW: 15.4 % (ref 11.5–15.5)
WBC: 9.4 10*3/uL (ref 4.0–10.5)

## 2015-11-24 LAB — LACTATE DEHYDROGENASE: LDH: 279 U/L — ABNORMAL HIGH (ref 98–192)

## 2015-11-24 LAB — COMPREHENSIVE METABOLIC PANEL
ALBUMIN: 2.4 g/dL — AB (ref 3.5–5.0)
ALK PHOS: 64 U/L (ref 38–126)
ALT: 23 U/L (ref 17–63)
AST: 41 U/L (ref 15–41)
Anion gap: 8 (ref 5–15)
BILIRUBIN TOTAL: 9.3 mg/dL — AB (ref 0.3–1.2)
BUN: 8 mg/dL (ref 6–20)
CALCIUM: 9.1 mg/dL (ref 8.9–10.3)
CO2: 24 mmol/L (ref 22–32)
CREATININE: 1.14 mg/dL (ref 0.61–1.24)
Chloride: 102 mmol/L (ref 101–111)
GFR calc non Af Amer: >60 mL/min (ref >60)
GLUCOSE: 105 mg/dL — AB (ref 65–99)
Potassium: 4.6 mmol/L (ref 3.5–5.1)
SODIUM: 134 mmol/L — AB (ref 135–145)
TOTAL PROTEIN: 6.2 g/dL — AB (ref 6.5–8.1)

## 2015-11-24 LAB — PROTIME-INR
INR: 1.43 (ref 0.00–1.49)
Prothrombin Time: 17.5 seconds — ABNORMAL HIGH (ref 11.6–15.2)

## 2015-11-24 LAB — POCT I-STAT, CHEM 8
BUN: 13 mg/dL (ref 6–20)
CHLORIDE: 96 mmol/L — AB (ref 101–111)
CREATININE: 1.1 mg/dL (ref 0.61–1.24)
Calcium, Ion: 1.26 mmol/L — ABNORMAL HIGH (ref 1.12–1.23)
Glucose, Bld: 106 mg/dL — ABNORMAL HIGH (ref 65–99)
HEMATOCRIT: 33 % — AB (ref 39.0–52.0)
Hemoglobin: 11.2 g/dL — ABNORMAL LOW (ref 13.0–17.0)
POTASSIUM: 4.7 mmol/L (ref 3.5–5.1)
SODIUM: 134 mmol/L — AB (ref 135–145)
TCO2: 30 mmol/L (ref 0–100)

## 2015-11-24 LAB — CARBOXYHEMOGLOBIN
Carboxyhemoglobin: 2.1 % — ABNORMAL HIGH (ref 0.5–1.5)
METHEMOGLOBIN: 0.7 % (ref 0.0–1.5)
O2 Saturation: 66.2 %
Total hemoglobin: 9.7 g/dL — ABNORMAL LOW (ref 13.5–18.0)

## 2015-11-24 LAB — MAGNESIUM: MAGNESIUM: 1.9 mg/dL (ref 1.7–2.4)

## 2015-11-24 LAB — PHOSPHORUS: Phosphorus: 4.6 mg/dL (ref 2.5–4.6)

## 2015-11-24 MED ORDER — WARFARIN SODIUM 5 MG PO TABS
5.0000 mg | ORAL_TABLET | Freq: Once | ORAL | Status: AC
  Administered 2015-11-24: 5 mg via ORAL
  Filled 2015-11-24: qty 1

## 2015-11-24 MED ORDER — FUROSEMIDE 10 MG/ML IJ SOLN: 40.0000 mg | Freq: Every day | INTRAMUSCULAR | Status: DC

## 2015-11-24 MED ORDER — GUAIFENESIN ER 600 MG PO TB12
1200.0000 mg | ORAL_TABLET | Freq: Two times a day (BID) | ORAL | Status: AC
  Administered 2015-11-24 – 2015-11-27 (×8): 1200 mg via ORAL
  Filled 2015-11-24 (×8): qty 2

## 2015-11-24 MED ORDER — MILRINONE LACTATE IN DEXTROSE 20-5 MG/100ML-% IV SOLN
0.1250 ug/kg/min | INTRAVENOUS | Status: DC
  Administered 2015-11-24 – 2015-11-25 (×3): 0.25 ug/kg/min via INTRAVENOUS
  Filled 2015-11-24 (×3): qty 100

## 2015-11-24 MED ORDER — FUROSEMIDE 10 MG/ML IJ SOLN
40.0000 mg | Freq: Once | INTRAMUSCULAR | Status: AC
  Administered 2015-11-24: 40 mg via INTRAVENOUS
  Filled 2015-11-24: qty 4

## 2015-11-24 MED ORDER — SODIUM CHLORIDE 0.9% FLUSH
10.0000 mL | INTRAVENOUS | Status: DC | PRN
  Administered 2015-11-28: 10 mL
  Administered 2015-11-28: 20 mL
  Administered 2015-11-29: 10 mL
  Administered 2015-11-29 – 2015-12-03 (×3): 20 mL
  Administered 2015-12-04: 30 mL
  Filled 2015-11-24 (×7): qty 40

## 2015-11-24 MED ORDER — SODIUM CHLORIDE 0.9% FLUSH
10.0000 mL | Freq: Two times a day (BID) | INTRAVENOUS | Status: DC
  Administered 2015-11-25 – 2015-12-05 (×7): 10 mL

## 2015-11-24 NOTE — Progress Notes (Signed)
Patient ID: Matthew Vaughan, male   DOB: 03-04-1956, 60 y.o.   MRN: [id number] HeartMate 2 Rounding Note  Subjective:    No complaints. Did not sleep much. Pain under control.  Co-ox 66.2 on Milrinone 0.375  CVP 10   LVAD INTERROGATION:  HeartMate II LVAD:  Flow 4.8 liters/min, speed 9000, power 5.1, PI 5.8.  No PI events postop  Objective:    Vital Signs:   Temp:  [97.7 F (36.5 C)-98.5 F (36.9 C)] 98.5 F (36.9 C) (05/11 0400) Pulse Rate:  [68-121] 109 (05/11 0600) Resp:  [15-33] 19 (05/11 0600) BP: (87-101)/(53-87) 101/87 mmHg (05/11 0600) SpO2:  [93 %-100 %] 100 % (05/11 0600) Arterial Line BP: (71-83)/(64-75) 83/75 mmHg (05/10 0930) Weight:  [78.7 kg (173 lb 8 oz)] 78.7 kg (173 lb 8 oz) (05/11 0500) Last BM Date: 11/20/15 Mean arterial Pressure 80-90  Intake/Output:   Intake/Output Summary (Last 24 hours) at 11/24/15 0656 Last data filed at 11/24/15 0600  Gross per 24 hour  Intake    636 ml  Output   1565 ml  Net   -929 ml     Physical Exam: General:  Well appearing. No resp difficulty HEENT: normal Cor: normal heart sounds with LVAD hum present. Lungs: clear Abdomen: soft, nontender, nondistended. No hepatosplenomegaly. No bruits or masses. Good bowel sounds. Extremities: no cyanosis, clubbing, rash, edema Neuro: alert & orientedx3, cranial nerves grossly intact. moves all 4 extremities w/o difficulty. Affect pleasant  Telemetry: sinus tach 110-120  Labs: Basic Metabolic Panel:  Recent Labs Lab 11/21/15 0415  11/21/15 1605 11/21/15 1930 11/21/15 1933 11/22/15 0415 11/22/15 1527 11/22/15 1529 11/23/15 0355 11/24/15 0409  NA 132*  < > 141  --  141 140 138  --  136 134*  K 4.1  < > 4.5  --  5.0 4.6 4.4  --  4.4 4.6  CL 100*  < > 112*  --  107 109 103  --  106 102  CO2 23  --  23  --   --  21*  --   --  21* 24  GLUCOSE 221*  < > 92  --  138* 108* 106*  --  104* 105*  BUN 15  < > 10  --  --  10 8  CREATININE 1.00  < > 1.03 1.19 1.00  1.17 1.10 1.18 1.10 1.14  CALCIUM 9.0  --  8.6*  --   --  8.4*  --   --  8.8* 9.1  MG  --   < >  --  2.6*  --  2.0  --  1.9 1.7 1.9  PHOS  --   --   --   --   --  3.4  --   --  5.1* 4.6  < > = values in this interval not displayed.  Liver Function Tests:  Recent Labs Lab 11/20/15 0529 11/22/15 0415 11/23/15 0355 11/24/15 0409  AST 38 82* 62* 41  ALT 51 ALKPHOS 87 53 55 64  BILITOT 1.0 5.7* 9.8* 9.3*  PROT 7.4 5.6* 5.7* 6.2*  ALBUMIN 2.6* 2.5* 2.5* 2.4*   No results for input(s): LIPASE, AMYLASE in the last 168 hours. No results for input(s): AMMONIA in the last 168 hours.  CBC:  Recent Labs Lab 11/21/15 1930  11/22/15 0415 11/22/15 1527 11/22/15 1529 11/23/15 0355 11/24/15 0409  WBC 10.7*  --  7.5  --  9.1 11.3* 9.4  NEUTROABS  --   --  5.4  --   --  8.8* 7.1  HGB 11.1*  < > 9.3* 10.2* 8.9* 8.6* 8.8*  HCT 33.3*  < > 28.1* 30.0* 27.3* 26.0* 26.3*  MCV 100.0  --  97.9  --  100.0 101.6* 101.5*  PLT 167  --  156  --  144* 142* 165  < > = values in this interval not displayed.  INR:  Recent Labs Lab 11/19/15 0340 11/21/15 1535 11/22/15 0415 11/23/15 0355 11/24/15 0409  INR 1.30 1.61* 1.49 1.51* 1.43    Other results:  EKG:   Imaging: Dg Chest Port 1 View  11/23/2015  CLINICAL DATA:  Presence of left ventricular assist device. Chest tube in place. EXAM: PORTABLE CHEST 1 VIEW COMPARISON:  11/22/2015 FINDINGS: Endotracheal tube and nasogastric tube have been removed. There continues to be a mediastinal and left chest tube. Negative for pneumothorax. Swan-Ganz catheter has been removed but there continues to be 2 right jugular central lines. Central line tips are in the region of the upper SVC and right innominate vein. There is stable cardiomegaly. LVAD is noted. Few linear densities in the periphery of the right upper lung may represent atelectasis, otherwise, the lungs are clear. No definite pulmonary edema. IMPRESSION: Support apparatuses as described.  Left chest tubes are still present and no evidence for pneumothorax. LVAD with cardiomegaly. No pulmonary edema. Electronically Signed   By: Richarda Overlie M.D.   On: 11/23/2015 07:54      Medications:     Scheduled Medications: . acetaminophen  1,000 mg Oral Q6H   Or  . acetaminophen (TYLENOL) oral liquid 160 mg/5 mL  1,000 mg Per Tube Q6H  . antiseptic oral rinse  7 mL Mouth Rinse BID  . aspirin EC  325 mg Oral Daily   Or  . aspirin  324 mg Per Tube Daily   Or  . aspirin  300 mg Rectal Daily  . bisacodyl  10 mg Oral Daily   Or  . bisacodyl  10 mg Rectal Daily  . digoxin  0.125 mg Oral Daily  . docusate sodium  200 mg Oral Daily  . feeding supplement (ENSURE ENLIVE)  237 mL Oral BID BM  . guaiFENesin  1,200 mg Oral BID  . insulin regular  0-10 Units Intravenous TID WC  . pantoprazole  40 mg Oral Daily  . sodium chloride flush  10-40 mL Intracatheter Q12H  . sodium chloride flush  3 mL Intravenous Q12H  . Warfarin - Pharmacist Dosing Inpatient   Does not apply q1800     Infusions: . sodium chloride Stopped (11/22/15 1500)  . sodium chloride    . sodium chloride Stopped (11/22/15 1500)  . insulin (NOVOLIN-R) infusion Stopped (11/21/15 1600)  . lactated ringers 20 mL (11/22/15 0314)  . lactated ringers 20 mL/hr at 11/23/15 1600  . milrinone 0.25 mcg/kg/min (11/24/15 0700)     PRN Medications:  sodium chloride, lactated ringers, morphine injection, ondansetron (ZOFRAN) IV, oxyCODONE, sodium chloride flush, sodium chloride flush, traMADol   Assessment:  POD 3 S/P HeartMate II LVAD  1. Non-ischemic cardiomyopathy with acute on chronic systolic CHF and cardiogenic shock preop. He had a stable night and levophed weaned. 2. Expected postop acute blood loss anemia 3. Small chronic laminated basilar anterior wall LV thrombus not removed     Plan/Discussion:    He is doing well overall with stable Co-ox of 66 on Milrinone 0.375. He continues to have mild sinus tach so  will decrease Milrinone to 0.25  to see if this helps.   Total body volume excess with weight 15 lbs over preop. He was negative one liter yesterday on his own. Will give him a dose of lasix today.  Chest tube output decreasing. Will remove MT's and keep left pleural tube and pocket drain today.  Have PICC inserted and get central line out  INR not bumped yet on 2.5 mg x 2. Can increase it a little more. See what pharmacy thinks.   Continue IS, mobilization.   I reviewed the LVAD parameters from today, and compared the results to the patient's prior recorded data.  No programming changes were made.  The LVAD is functioning within specified parameters.  The patient performs LVAD self-test daily.  LVAD interrogation was negative for any significant power changes, alarms or PI events/speed drops.  LVAD equipment check completed and is in good working order.  Back-up equipment present.   LVAD education done on emergency procedures and precautions and reviewed exit site care.  Length of Stay: [address]  Payton Doughty [facility] 11/24/2015, 6:56 AM

## 2015-11-24 NOTE — Progress Notes (Signed)
Patient ID: Matthew Vaughan, male   DOB: 11/03/1955, 60 y.o.   MRN: [id number] HeartMate 2 Rounding Note  Subjective:    POD 3 S/P HMII LVAD.  Extubated 5/9 and swan removed.   Remains on milrinone 0.25 mcg.   He is now on warfarin.   Denies SOB/Orthopnea. Able to get OOB to chair.   LVAD INTERROGATION:  HeartMate II LVAD:  Flow 4.8 liters/min, speed 9000, power 5.2, PI 5.8.  No PI events over night.   Objective:    Vital Signs:   Temp:  [97.7 F (36.5 C)-98.5 F (36.9 C)] 98.5 F (36.9 C) (05/11 0400) Pulse Rate:  [68-121] 110 (05/11 0700) Resp:  [15-33] 19 (05/11 0700) BP: (76-101)/(53-87) 76/67 mmHg (05/11 0700) SpO2:  [93 %-100 %] 99 % (05/11 0700) Arterial Line BP: (71-83)/(64-75) 83/75 mmHg (05/10 0930) Weight:  [173 lb 8 oz (78.7 kg)] 173 lb 8 oz (78.7 kg) (05/11 0500) Last BM Date: 11/20/15 Mean arterial Pressure 70-80s   Intake/Output:   Intake/Output Summary (Last 24 hours) at 11/24/15 0719 Last data filed at 11/24/15 0700  Gross per 24 hour  Intake    636 ml  Output   1565 ml  Net   -929 ml     Physical Exam: CVP ~12  General:  Intubated.  HEENT: normal Neck: supple. JVP 10-11 Carotids 2+ bilat; no bruits. No lymphadenopathy or thryomegaly appreciated. Cor: Mechanical heart sounds with LVAD hum present. Sternal dressing intact Lungs: clear Abdomen: soft, nontender, nondistended. No hepatosplenomegaly. No bruits or masses. Good bowel sounds. Driveline: C/D/I; securement device intact and driveline incorporated Extremities: no cyanosis, clubbing, rash, edema Neuro: Alert and oriented x3 .   Telemetry: Sinus tachy around 100s  Labs: Basic Metabolic Panel:  Recent Labs Lab 11/21/15 0415  11/21/15 1605 11/21/15 1930 11/21/15 1933 11/22/15 0415 11/22/15 1527 11/22/15 1529 11/23/15 0355 11/24/15 0409  NA 132*  < > 141  --  141 140 138  --  136 134*  K 4.1  < > 4.5  --  5.0 4.6 4.4  --  4.4 4.6  CL 100*  < > 112*  --  107 109 103  --  106 102   CO2 23  --  23  --   --  21*  --   --  21* 24  GLUCOSE 221*  < > 92  --  138* 108* 106*  --  104* 105*  BUN 15  < > 10  --  --  10 8  CREATININE 1.00  < > 1.03 1.19 1.00 1.17 1.10 1.18 1.10 1.14  CALCIUM 9.0  --  8.6*  --   --  8.4*  --   --  8.8* 9.1  MG  --   < >  --  2.6*  --  2.0  --  1.9 1.7 1.9  PHOS  --   --   --   --   --  3.4  --   --  5.1* 4.6  < > = values in this interval not displayed.  Liver Function Tests:  Recent Labs Lab 11/20/15 0529 11/22/15 0415 11/23/15 0355 11/24/15 0409  AST 38 82* 62* 41  ALT 51 ALKPHOS 87 53 55 64  BILITOT 1.0 5.7* 9.8* 9.3*  PROT 7.4 5.6* 5.7* 6.2*  ALBUMIN 2.6* 2.5* 2.5* 2.4*   No results for input(s): LIPASE, AMYLASE in the last 168 hours. No results for input(s): AMMONIA in the  last 168 hours.  CBC:  Recent Labs Lab 11/21/15 1930  11/22/15 0415 11/22/15 1527 11/22/15 1529 11/23/15 0355 11/24/15 0409  WBC 10.7*  --  7.5  --  9.1 11.3* 9.4  NEUTROABS  --   --  5.4  --   --  8.8* 7.1  HGB 11.1*  < > 9.3* 10.2* 8.9* 8.6* 8.8*  HCT 33.3*  < > 28.1* 30.0* 27.3* 26.0* 26.3*  MCV 100.0  --  97.9  --  100.0 101.6* 101.5*  PLT 167  --  156  --  144* 142* 165  < > = values in this interval not displayed.  INR:  Recent Labs Lab 11/19/15 0340 11/21/15 1535 11/22/15 0415 11/23/15 0355 11/24/15 0409  INR 1.30 1.61* 1.49 1.51* 1.43    Other results:  EKG:   Imaging: Dg Chest Port 1 View  11/23/2015  CLINICAL DATA:  Presence of left ventricular assist device. Chest tube in place. EXAM: PORTABLE CHEST 1 VIEW COMPARISON:  11/22/2015 FINDINGS: Endotracheal tube and nasogastric tube have been removed. There continues to be a mediastinal and left chest tube. Negative for pneumothorax. Swan-Ganz catheter has been removed but there continues to be 2 right jugular central lines. Central line tips are in the region of the upper SVC and right innominate vein. There is stable cardiomegaly. LVAD is noted. Few linear  densities in the periphery of the right upper lung may represent atelectasis, otherwise, the lungs are clear. No definite pulmonary edema. IMPRESSION: Support apparatuses as described. Left chest tubes are still present and no evidence for pneumothorax. LVAD with cardiomegaly. No pulmonary edema. Electronically Signed   By: Richarda Overlie M.D.   On: 11/23/2015 07:54     Medications:     Scheduled Medications: . acetaminophen  1,000 mg Oral Q6H   Or  . acetaminophen (TYLENOL) oral liquid 160 mg/5 mL  1,000 mg Per Tube Q6H  . antiseptic oral rinse  7 mL Mouth Rinse BID  . aspirin EC  325 mg Oral Daily   Or  . aspirin  324 mg Per Tube Daily   Or  . aspirin  300 mg Rectal Daily  . bisacodyl  10 mg Oral Daily   Or  . bisacodyl  10 mg Rectal Daily  . digoxin  0.125 mg Oral Daily  . docusate sodium  200 mg Oral Daily  . feeding supplement (ENSURE ENLIVE)  237 mL Oral BID BM  . furosemide  40 mg Intravenous Once  . guaiFENesin  1,200 mg Oral BID  . insulin regular  0-10 Units Intravenous TID WC  . pantoprazole  40 mg Oral Daily  . sodium chloride flush  10-40 mL Intracatheter Q12H  . sodium chloride flush  3 mL Intravenous Q12H  . Warfarin - Pharmacist Dosing Inpatient   Does not apply q1800    Infusions: . sodium chloride Stopped (11/22/15 1500)  . sodium chloride    . sodium chloride Stopped (11/22/15 1500)  . insulin (NOVOLIN-R) infusion Stopped (11/21/15 1600)  . lactated ringers 20 mL (11/22/15 0314)  . lactated ringers 20 mL/hr at 11/23/15 1600  . milrinone 0.25 mcg/kg/min (11/24/15 0700)    PRN Medications: sodium chloride, lactated ringers, morphine injection, ondansetron (ZOFRAN) IV, oxyCODONE, sodium chloride flush, sodium chloride flush, traMADol   Assessment/Plan/Discussion   1. S/P HMII LVAD POD #3: Stable. Good co-ox at 67%, CVP 7.  He is now off norepinephrine and remains on milrinone 0.25.  MAP at goal in 70s. VAD parameters stable  at 9000 rpm.  - Continue 325 mg  aspirin until INR low threshold of goal then decrease to 81.  - Milrinone already cut back to 0.25 mcg.   2. Acute/chronic systolic CHF -> cardiogenic shock: Nonischemic cardiomyopathy. CVPs up a little. Receiving IV lasix.   3. LV thrombus: Small thrombus on MRI.  On Warfarin, goal INR 2-2.5.  4. Anemia: Expected blood loss from Surgery. Hemoglobin 8.8.  5. Rhythm: He appears to be in sinus tachycardia by echo this morning, HR around 100.   I reviewed the LVAD parameters from today, and compared the results to the patient's prior recorded data.  No programming changes were made.  The LVAD is functioning within specified parameters.  The patient performs LVAD self-test daily.  LVAD interrogation was negative for any significant power changes, alarms or PI events/speed drops.  LVAD equipment check completed and is in good working order.  Back-up equipment present.   LVAD education done on emergency procedures and precautions and reviewed exit site care.  Length of Stay: 48  Amy Clegg NP-C- 11/24/2015, 7:19 AM  VAD Team --- VAD ISSUES ONLY--- Pager [phone] (7am - 7am)  Advanced Heart Failure Team  Pager [phone] (M-F; 7a - 4p)  Please contact [facility] for night-coverage after hours (4p -7a ) and weekends on [email or web address]  Patient seen with PA, agree with the above note. He is doing well.  Milrinone decreased to 0.25, got 1 dose of IV Lasix with good urine output.  - Would plan to decrease milrinone again to 0.125 tomorrow if co-ox looks good.  - He is on digoxin, will check level in am.   - He is on warfarin/ASA.   - Hemoglobin stable.  - Will do some walking today.   [name] 11/24/2015 12:04 PM

## 2015-11-24 NOTE — Progress Notes (Signed)
ANTICOAGULATION CONSULT NOTE - Follow Up Consult  Pharmacy Consult for Warfarin Indication: LV thrombus / LVAD  No Known Allergies  Patient Measurements: Height: 5\' 11"  (180.3 cm) Weight: 173 lb 8 oz (78.7 kg) IBW/kg (Calculated) : 75.3  Vital Signs: Temp: 97.7 F (36.5 C) (05/11 1228) Temp Source: Oral (05/11 1228) BP: 115/90 mmHg (05/11 1200) Pulse Rate: 109 (05/11 1200)  Labs:  Recent Labs  11/21/15 1535  11/22/15 0415  11/22/15 1529 11/23/15 0355 11/24/15 0409  HGB  --   < > 9.3*  < > 8.9* 8.6* 8.8*  HCT  --   < > 28.1*  < > 27.3* 26.0* 26.3*  PLT 152  < > 156  --  144* 142* 165  APTT 37  --   --   --   --   --   --   LABPROT 19.2*  --  18.1*  --   --  18.2* 17.5*  INR 1.61*  --  1.49  --   --  1.51* 1.43  CREATININE  --   < > 1.17  < > 1.18 1.10 1.14  < > = values in this interval not displayed.  Estimated Creatinine Clearance: 75.2 mL/min (by C-G formula based on Cr of 1.14).  Assessment: 59 yo M on warfarin PTA for hx LV thrombus, transitioned to IV heparin on admission pending cath. S/P cath 4/24, found to have NICM, resumed heparin as bridge to LVAD S/P  LVAD on 5/8. Warfarin resumed 5/9 after discussion with MD INR 1.43; no bleeding or complications noted.  Goal of Therapy:  INR 2-2.5 Monitor platelets by anticoagulation protocol: Yes   Plan:  Increase dose for today to Coumadin 5 mg x1.  Likely will need some combination of 2.5 mg / 5 mg long term. Daily INR.  Tad Moore, BCPS  Clinical Pharmacist Pager [phone]  11/24/2015 1:58 PM

## 2015-11-24 NOTE — Telephone Encounter (Signed)
CSW left message to with disability to confirm medical records sent for review. Awaiting return call from [employer]. Lasandra Beech, LCSW [phone]

## 2015-11-24 NOTE — Progress Notes (Signed)
CSW met at bedside with patient who reports continued improvement. Patient states he had an opportunity to speak with his mother via phone and his brother visits daily. "There is nothing they can do anyway so rather they stay working" Patient stated he feels good and pain level is managed well. He hopes to walk later today around the unit and feels stronger each day. Patient appears to be coping well and begun adjustment to life with LVAD. CSW discussed support group and encouraged attendance if he feels up to it next week. CSW will continue to follow for support and encouragement throughout recovery. Jackie Brennan, LCSW [phone]   

## 2015-11-24 NOTE — Progress Notes (Signed)
Peripherally Inserted Central Catheter/Midline Placement  The IV Nurse has discussed with the patient and/or persons authorized to consent for the patient, the purpose of this procedure and the potential benefits and risks involved with this procedure.  The benefits include less needle sticks, lab draws from the catheter and patient may be discharged home with the catheter.  Risks include, but not limited to, infection, bleeding, blood clot (thrombus formation), and puncture of an artery; nerve damage and irregular heat beat.  Alternatives to this procedure were also discussed.  PICC/Midline Placement Documentation        Matthew Vaughan, [name] 11/24/2015, 9:45 PM

## 2015-11-25 ENCOUNTER — Inpatient Hospital Stay (HOSPITAL_COMMUNITY): Payer: Medicaid Other

## 2015-11-25 DIAGNOSIS — Z95811 Presence of heart assist device: Secondary | ICD-10-CM

## 2015-11-25 DIAGNOSIS — I509 Heart failure, unspecified: Secondary | ICD-10-CM

## 2015-11-25 LAB — CBC WITH DIFFERENTIAL/PLATELET
Basophils Absolute: 0 10*3/uL (ref 0.0–0.1)
Basophils Relative: 0 %
EOS ABS: 0.5 10*3/uL (ref 0.0–0.7)
EOS PCT: 5 %
HCT: 28.3 % — ABNORMAL LOW (ref 39.0–52.0)
Hemoglobin: 9 g/dL — ABNORMAL LOW (ref 13.0–17.0)
LYMPHS ABS: 1.1 10*3/uL (ref 0.7–4.0)
Lymphocytes Relative: 11 %
MCH: 31.6 pg (ref 26.0–34.0)
MCHC: 31.8 g/dL (ref 30.0–36.0)
MCV: 99.3 fL (ref 78.0–100.0)
MONOS PCT: 10 %
Monocytes Absolute: 1 10*3/uL (ref 0.1–1.0)
Neutro Abs: 7.5 10*3/uL (ref 1.7–7.7)
Neutrophils Relative %: 74 %
PLATELETS: 207 10*3/uL (ref 150–400)
RBC: 2.85 MIL/uL — ABNORMAL LOW (ref 4.22–5.81)
RDW: 14.6 % (ref 11.5–15.5)
WBC: 10.1 10*3/uL (ref 4.0–10.5)

## 2015-11-25 LAB — COMPREHENSIVE METABOLIC PANEL
ALT: 21 U/L (ref 17–63)
AST: 32 U/L (ref 15–41)
Albumin: 2.3 g/dL — ABNORMAL LOW (ref 3.5–5.0)
Alkaline Phosphatase: 70 U/L (ref 38–126)
Anion gap: 9 (ref 5–15)
BUN: 10 mg/dL (ref 6–20)
CO2: 27 mmol/L (ref 22–32)
Calcium: 9.2 mg/dL (ref 8.9–10.3)
Chloride: 98 mmol/L — ABNORMAL LOW (ref 101–111)
Creatinine, Ser: 1.04 mg/dL (ref 0.61–1.24)
GFR calc Af Amer: >60 mL/min (ref >60)
GFR calc non Af Amer: >60 mL/min (ref >60)
Glucose, Bld: 101 mg/dL — ABNORMAL HIGH (ref 65–99)
Potassium: 4.2 mmol/L (ref 3.5–5.1)
Sodium: 134 mmol/L — ABNORMAL LOW (ref 135–145)
Total Bilirubin: 10 mg/dL — ABNORMAL HIGH (ref 0.3–1.2)
Total Protein: 6.3 g/dL — ABNORMAL LOW (ref 6.5–8.1)

## 2015-11-25 LAB — LACTATE DEHYDROGENASE: LDH: 254 U/L — ABNORMAL HIGH (ref 98–192)

## 2015-11-25 LAB — CARBOXYHEMOGLOBIN
Carboxyhemoglobin: 2.1 % — ABNORMAL HIGH (ref 0.5–1.5)
Methemoglobin: 0.7 % (ref 0.0–1.5)
O2 Saturation: 68.1 %
Total hemoglobin: 9.6 g/dL — ABNORMAL LOW (ref 13.5–18.0)

## 2015-11-25 LAB — MAGNESIUM: MAGNESIUM: 1.6 mg/dL — AB (ref 1.7–2.4)

## 2015-11-25 LAB — PROTIME-INR
INR: 1.59 — AB (ref 0.00–1.49)
PROTHROMBIN TIME: 19 s — AB (ref 11.6–15.2)

## 2015-11-25 LAB — PHOSPHORUS: Phosphorus: 4.1 mg/dL (ref 2.5–4.6)

## 2015-11-25 MED ORDER — FUROSEMIDE 10 MG/ML IJ SOLN: 40.0000 mg | Freq: Every day | INTRAMUSCULAR | Status: AC

## 2015-11-25 MED ORDER — FUROSEMIDE 10 MG/ML IJ SOLN
INTRAMUSCULAR | Status: AC
  Administered 2015-11-25: 40 mg
  Filled 2015-11-25: qty 4

## 2015-11-25 MED ORDER — FUROSEMIDE 40 MG PO TABS
40.0000 mg | ORAL_TABLET | Freq: Every day | ORAL | Status: DC
  Administered 2015-11-26 – 2015-12-03 (×8): 40 mg via ORAL
  Filled 2015-11-25 (×9): qty 1

## 2015-11-25 MED ORDER — POLYETHYLENE GLYCOL 3350 17 G PO PACK
17.0000 g | PACK | Freq: Every day | ORAL | Status: AC
  Administered 2015-11-25 – 2015-11-26 (×2): 17 g via ORAL
  Filled 2015-11-25 (×5): qty 1

## 2015-11-25 MED ORDER — HYDRALAZINE HCL 20 MG/ML IJ SOLN: 10.0000 mg | INTRAMUSCULAR | Status: DC | PRN

## 2015-11-25 MED ORDER — SODIUM CHLORIDE 0.9 % IV SOLN
INTRAVENOUS | Status: DC
  Administered 2015-11-25: 10:00:00 via INTRAVENOUS

## 2015-11-25 MED ORDER — LISINOPRIL 5 MG PO TABS
5.0000 mg | ORAL_TABLET | Freq: Every day | ORAL | Status: DC
Start: 2015-11-25 — End: 2015-12-06
  Administered 2015-11-25 – 2015-12-06 (×12): 5 mg via ORAL
  Filled 2015-11-25 (×12): qty 1

## 2015-11-25 MED ORDER — HYDRALAZINE HCL 10 MG PO TABS: 10.0000 mg | ORAL_TABLET | Freq: Four times a day (QID) | ORAL | Status: DC

## 2015-11-25 MED ORDER — HYDRALAZINE HCL 20 MG/ML IJ SOLN: 12.5000 mg | Freq: Three times a day (TID) | INTRAMUSCULAR | Status: DC

## 2015-11-25 MED ORDER — WARFARIN SODIUM 5 MG PO TABS
5.0000 mg | ORAL_TABLET | Freq: Once | ORAL | Status: AC
  Administered 2015-11-25: 5 mg via ORAL
  Filled 2015-11-25: qty 1

## 2015-11-25 MED ORDER — HYDRALAZINE HCL 10 MG PO TABS
10.0000 mg | ORAL_TABLET | Freq: Four times a day (QID) | ORAL | Status: DC
  Administered 2015-11-25: 10 mg via ORAL
  Filled 2015-11-25: qty 1

## 2015-11-25 NOTE — Progress Notes (Signed)
Physical Therapy Treatment Patient Details Name: Matthew Vaughan MRN: [id number] DOB: [birth year] Today's Date: 11/25/2015    History of Present Illness 60 y.o. male. Residence is in [place] but was visiting family in [place] when he came to hospital. Hx non-ischemic CM. EF ~ 20% historically. HTN. Chronic Coumadin for hx LV thrombus. S/p cath in 2015. Presents with Non-ischemic cardiomyopathy with acute on chronic systolic CHF and cardiogenic shock. Patient is now s/p LVAD implantation.    PT Comments    Patient agreed to work with therapies after a 2 hour nap and feeling better. Doing very well overall with mobility; continues to require cues and education re: use of batteries for LVAD.   Follow Up Recommendations  Home health PT;Supervision/Assistance - 24 hour     Equipment Recommendations   (rollator?)    Recommendations for Other Services       Precautions / Restrictions Precautions Precautions: Fall;Sternal Precaution Comments: LVAD, chest tube Restrictions Weight Bearing Restrictions: No Other Position/Activity Restrictions: sternal precautions    Mobility  Bed Mobility Overal bed mobility: Needs Assistance;+2 for physical assistance;+ 2 for safety/equipment Bed Mobility: Sit to Sidelying;Rolling;Sidelying to Sit Rolling: Min guard   Supine to sit: Mod assist;HOB elevated   Sit to sidelying: Mod assist General bed mobility comments: assist to raise LEs into bed; vc throuaghout for technique  Transfers Overall transfer level: Needs assistance Equipment used: Pushed w/c Transfers: Sit to/from Stand Sit to Stand: +2 physical assistance;Min assist;+2 safety/equipment         General transfer comment: x2; min assist to rise using momentum and for stability  Ambulation/Gait Ambulation/Gait assistance: Min assist;+2 safety/equipment Ambulation Distance (Feet): 600 Feet Assistive device:  (push w/c) Gait Pattern/deviations: Step-through pattern;Decreased stride  length;Drifts right/left   Gait velocity interpretation: Below normal speed for age/gender General Gait Details: steady, however requires w/c to carry his equipment   Stairs            Wheelchair Mobility    Modified Rankin (Stroke Patients Only)       Balance Overall balance assessment: Needs assistance Sitting-balance support: Feet supported;No upper extremity supported Sitting balance-Leahy Scale: Good     Standing balance support: No upper extremity supported Standing balance-Leahy Scale: Fair                      Cognition Arousal/Alertness: Awake/alert Behavior During Therapy: WFL for tasks assessed/performed Overall Cognitive Status: Within Functional Limits for tasks assessed                      Exercises      General Comments General comments (skin integrity, edema, etc.): Patient doing well with "quizzing" re: use of batteries vs AC power. During transition back to [facility], pt began to [employer] battery while white was already disconnected and required instructions to prevent      Pertinent Vitals/Pain Pain Assessment: Faces Faces Pain Scale: Hurts even more Pain Location: abdomen Pain Descriptors / Indicators: Discomfort;Grimacing;Guarding Pain Intervention(s): Limited activity within patient's tolerance;Monitored during session;Repositioned    Home Living                      Prior Function            PT Goals (current goals can now be found in the care plan section) Acute Rehab PT Goals Patient Stated Goal: Go fishing. Time For Goal Achievement: 12/07/15 Progress towards PT goals: Progressing toward goals    Frequency  Min 4X/week    PT Plan Current plan remains appropriate    Co-evaluation PT/OT/SLP Co-Evaluation/Treatment: Yes Reason for Co-Treatment: Complexity of the patient's impairments (multi-system involvement);For patient/therapist safety PT goals addressed during session: Mobility/safety with  mobility;Balance;Proper use of DME OT goals addressed during session: ADL's and self-care     End of Session Equipment Utilized During Treatment: Oxygen (no gait belt due to pocket pain and chest tube) Activity Tolerance: Patient tolerated treatment well Patient left: in chair;with call bell/phone within reach     Time: 1537-1619 PT Time Calculation (min) (ACUTE ONLY): 42 min  Charges:  $Gait Training: 23-37 mins                    G Codes:      Matthew Vaughan 12/05/2015, 5:24 PM Pager [phone]

## 2015-11-25 NOTE — Progress Notes (Signed)
HeartMate 2 Rounding Note  Subjective:   Postop day 4 HeartMate 2 implantation for acute on chronic systolic heart failure, nonischemic cardiomyopathy requiring 2 inotropes  The patient had a stable night Mean arterial pressure is climbing greater than 100-Will add by mouth Apresoline Chest x-rays clear, chest tubes drained over 300 cc from pocket and left pleural location and will be retained today Stable bad flows and PI, no PI events Sinus tachycardia this a.m., probably related to pain Excellent diuresis with IV Lasix, weight down 2 pounds  Patient has been ambulating in hallway Surgical incisions clean and dry INR 1.6 so will not need Lovenox Plan on transferring to stepdown    LVAD INTERROGATION:  HeartMate II LVAD:  Flow 5.5 liters/min, speed 9000 rpm power 5.1, PI 6.8  Controller Serial # unchanged   Objective:    Vital Signs:   Temp:  [97.7 F (36.5 C)-98.4 F (36.9 C)] 98.4 F (36.9 C) (05/12 0800) Pulse Rate:  [99-124] 105 (05/12 0900) Resp:  [12-26] 19 (05/12 0900) BP: (84-115)/(69-97) 113/90 mmHg (05/12 0900) SpO2:  [92 %-100 %] 100 % (05/12 0900) Weight:  [166 lb 7.2 oz (75.5 kg)] 166 lb 7.2 oz (75.5 kg) (05/12 0500) Last BM Date: 11/20/15 Mean arterial Pressure104  Intake/Output:   Intake/Output Summary (Last 24 hours) at 11/25/15 0938 Last data filed at 11/25/15 0900  Gross per 24 hour  Intake  834.2 ml  Output   3820 ml  Net -2985.8 ml     Physical Exam: General:  Well appearing. No resp difficulty HEENT: normal Neck: supple. JVP .  no bruits. No lymphadenopathy or thryomegaly appreciated. Cor: Mechanical heart sounds with LVAD hum present. Lungs: clear Abdomen: soft, nontender, nondistended. No hepatosplenomegaly. No bruits or masses. Good bowel sounds. Extremities: no cyanosis, clubbing, rash, edema Neuro: alert & orientedx3, cranial nerves grossly intact. moves all 4 extremities w/o difficulty. Affect pleasant  Telemetry: Sinus tachycardia  heart rate 105  Labs: Basic Metabolic Panel:  Recent Labs Lab 11/21/15 1605  11/22/15 0415 11/22/15 1527 11/22/15 1529 11/23/15 0355 11/24/15 0409 11/24/15 1928 11/25/15 0402  NA 141  < > 140 138  --  136 134* 134* 134*  K 4.5  < > 4.6 4.4  --  4.4 4.6 4.7 4.2  CL 112*  < > 109 103  --  106 102 96* 98*  CO2 23  --  21*  --   --  21* 24  --  27  GLUCOSE 92  < > 108* 106*  --  104* 105* 106* 101*  BUN 10  < > 10 10  --  _0 CREATININE 1.03  < > 1.17 1.10 1.18 1.10 1.14 1.10 1.04  CALCIUM 8.6*  --  8.4*  --   --  8.8* 9.1  --  9.2  MG  --   < > 2.0  --  1.9 1.7 1.9  --  1.6*  PHOS  --   --  3.4  --   --  5.1* 4.6  --  4.1  < > = values in this interval not displayed.  Liver Function Tests:  Recent Labs Lab 11/20/15 0529 11/22/15 0415 11/23/15 0355 11/24/15 0409 11/25/15 0402  AST 38 82* 62* 41 32  ALT 51 _1 ALKPHOS 87 53 55 64 70  BILITOT 1.0 5.7* 9.8* 9.3* 10.0*  PROT 7.4 5.6* 5.7* 6.2* 6.3*  ALBUMIN 2.6* 2.5* 2.5* 2.4* 2.3*   No results for  input(s): LIPASE, AMYLASE in the last 168 hours. No results for input(s): AMMONIA in the last 168 hours.  CBC:  Recent Labs Lab 11/22/15 0415  11/22/15 1529 11/23/15 0355 11/24/15 0409 11/24/15 1928 11/25/15 0402  WBC 7.5  --  9.1 11.3* 9.4  --  10.1  NEUTROABS 5.4  --   --  8.8* 7.1  --  7.5  HGB 9.3*  < > 8.9* 8.6* 8.8* 11.2* 9.0*  HCT 28.1*  < > 27.3* 26.0* 26.3* 33.0* 28.3*  MCV 97.9  --  100.0 101.6* 101.5*  --  99.3  PLT 156  --  144* 142* 165  --  207  < > = values in this interval not displayed.  INR:  Recent Labs Lab 11/21/15 1535 11/22/15 0415 11/23/15 0355 11/24/15 0409 11/25/15 0402  INR 1.61* 1.49 1.51* 1.43 1.59*    Other results:  EKG:   Imaging: Dg Chest Port 1 View  11/25/2015  CLINICAL DATA:  Insertion of LVAD. EXAM: PORTABLE CHEST 1 VIEW COMPARISON:  Yesterday FINDINGS: Unchanged cardiopericardial enlargement. Partly visible LVAD. Remaining thoracic drains in  stable position. New right upper extremity PICC with tip at the upper SVC. Unchanged retrocardiac opacity, presumably atelectasis. Small left apical pneumothorax that is stable from yesterday. IMPRESSION: 1. New right upper extremity PICC with tip near the upper SVC. 2. Unchanged trace left apical pneumothorax. 3. Stable aeration.  No pulmonary edema. Electronically Signed   By: Monte Fantasia M.D.   On: 11/25/2015 08:09   Dg Chest Port 1 View  11/24/2015  CLINICAL DATA:  Cardiac failure with a LVAD EXAM: PORTABLE CHEST 1 VIEW COMPARISON:  11/23/2015 FINDINGS: Cardiac shadow remains enlarged. A mediastinal drain is seen as well as left ventricular assist device inferiorly. A left thoracostomy catheter is noted. No pneumothorax is seen. Two right jugular lines are noted and stable. The lungs are well-aerated without focal infiltrate. Minimal atelectatic changes are noted in the right mid lung stable from the prior study. IMPRESSION: Overall stable appearance of the chest when compared with the prior exam. Electronically Signed   By: Inez Catalina M.D.   On: 11/24/2015 07:56      Medications:     Scheduled Medications: . acetaminophen  1,000 mg Oral Q6H   Or  . acetaminophen (TYLENOL) oral liquid 160 mg/5 mL  1,000 mg Per Tube Q6H  . aspirin EC  325 mg Oral Daily   Or  . aspirin  324 mg Per Tube Daily  . bisacodyl  10 mg Oral Daily   Or  . bisacodyl  10 mg Rectal Daily  . digoxin  0.125 mg Oral Daily  . docusate sodium  200 mg Oral Daily  . feeding supplement (ENSURE ENLIVE)  237 mL Oral BID BM  . furosemide  40 mg Intravenous Daily  . [START ON 11/26/2015] furosemide  40 mg Oral Daily  . guaiFENesin  1,200 mg Oral BID  . hydrALAZINE  10 mg Oral QID  . pantoprazole  40 mg Oral Daily  . polyethylene glycol  17 g Oral Daily  . sodium chloride flush  10-40 mL Intracatheter Q12H  . sodium chloride flush  3 mL Intravenous Q12H  . Warfarin - Pharmacist Dosing Inpatient   Does not apply q1800      Infusions: . sodium chloride Stopped (11/22/15 1500)  . milrinone 0.25 mcg/kg/min (11/25/15 0900)     PRN Medications:  hydrALAZINE, ondansetron (ZOFRAN) IV, oxyCODONE, sodium chloride flush, sodium chloride flush, traMADol   Assessment:  Excellent progress after HeartMate 2 implantation for acute on chronic systolic heart failure from idiopathic cardiomyopathy  Elevated bilirubin from heart failure induced liver dysfunction, follow daily C met.  INR climbing gradually continue Coumadinperpharmacy  Leave chest tubes in place today Plan/Discussion:   Transfer to stepdown, continue chest tubes and Coumadin dosing Switch Lasix to by mouth 40 mg daily tomorrow  I reviewed the LVAD parameters from today, and compared the results to the patient's prior recorded data.  No programming changes were made.  The LVAD is functioning within specified parameters.  The patient performs LVAD self-test daily.  LVAD interrogation was negative for any significant power changes, alarms or PI events/speed drops.  LVAD equipment check completed and is in good working order.  Back-up equipment present.   LVAD education done on emergency procedures and precautions and reviewed exit site care.  Length of Stay: [place] [facility] 11/25/2015, 9:38 AM

## 2015-11-25 NOTE — Progress Notes (Signed)
PT Cancellation Note  Patient Details Name: Braxon Joice MRN: [id number] DOB: July 14, 1956   Cancelled Treatment:    Reason Eval/Treat Not Completed: Pain limiting ability to participate. Attempted to see at 0830 and pt reporting in too much pain (around pocket) and wanted pain meds prior. Returned @0950  (after pain meds) and patient again refused. Stated he would like to try to walk later today. Will reattempt as schedule permits   Eymi Lipuma 11/25/2015, 9:59 AM  Pager [phone]

## 2015-11-25 NOTE — Progress Notes (Signed)
ANTICOAGULATION CONSULT NOTE - Follow Up Consult  Pharmacy Consult for Warfarin Indication: LV thrombus / LVAD  No Known Allergies  Patient Measurements: Height: 5\' 11"  (180.3 cm) Weight: 166 lb 7.2 oz (75.5 kg) IBW/kg (Calculated) : 75.3  Vital Signs: Temp: 97.8 F (36.6 C) (05/12 1200) Temp Source: Oral (05/12 1200) BP: 98/88 mmHg (05/12 1400) Pulse Rate: 105 (05/12 1400)  Labs:  Recent Labs  11/23/15 0355 11/24/15 0409 11/24/15 1928 11/25/15 0402  HGB 8.6* 8.8* 11.2* 9.0*  HCT 26.0* 26.3* 33.0* 28.3*  PLT 142* 165  --  207  LABPROT 18.2* 17.5*  --  19.0*  INR 1.51* 1.43  --  1.59*  CREATININE 1.10 1.14 1.10 1.04    Estimated Creatinine Clearance: 82.5 mL/min (by C-G formula based on Cr of 1.04).  Assessment: 60 yo M on warfarin PTA for hx LV thrombus, transitioned to IV heparin on admission pending cath. S/P cath 4/24, found to have NICM, resumed heparin as bridge to LVAD S/P  LVAD on 5/8. Warfarin resumed 5/9 after discussion with MD INR 1.59; no bleeding or complications noted.  Goal of Therapy:  INR 2-2.5 Monitor platelets by anticoagulation protocol: Yes   Plan:  Coumadin 5 mg x1.  Likely will need some combination of 2.5 mg / 5 mg long term. Daily INR.  Tad Moore, BCPS  Clinical Pharmacist Pager [phone]  11/25/2015 2:31 PM

## 2015-11-25 NOTE — Progress Notes (Signed)
Occupational Therapy Treatment Patient Details Name: Matthew Vaughan MRN: [id number] DOB: [birth year] Today's Date: 11/25/2015    History of present illness 60 y.o. male. Residence is in [place] but was visiting family in [place] when he came to hospital. Hx non-ischemic CM. EF ~ 20% historically. HTN. Chronic Coumadin for hx LV thrombus. S/p cath in 2015. Presents with Non-ischemic cardiomyopathy with acute on chronic systolic CHF and cardiogenic shock. Patient is now s/p LVAD implantation.   OT comments  Pt feeling well after 2 hour nap. Focus of session on mobility and managing LVAD equipment. Tolerated well. Pt is very determined.  Follow Up Recommendations  Home health OT;Supervision/Assistance - 24 hour    Equipment Recommendations  3 in 1 bedside comode    Recommendations for Other Services      Precautions / Restrictions Precautions Precautions: Fall;Sternal Precaution Comments: LVAD, chest tube Restrictions Weight Bearing Restrictions: Yes Other Position/Activity Restrictions: sternal precautions       Mobility Bed Mobility Overal bed mobility: Needs Assistance Bed Mobility: Supine to Sit;Sit to Sidelying;Rolling Rolling: Min guard   Supine to sit: Mod assist;HOB elevated   Sit to sidelying: Mod assist General bed mobility comments: min assist to elevate trunk and to raise LEs into bed  Transfers Overall transfer level: Needs assistance Equipment used: Pushed w/c Transfers: Sit to/from Stand Sit to Stand: +2 physical assistance;Min assist;+2 safety/equipment         General transfer comment: min assist to rise using momentum and for stability    Balance Overall balance assessment: Needs assistance Sitting-balance support: Feet supported Sitting balance-Leahy Scale: Good       Standing balance-Leahy Scale: Fair                     ADL                                         General ADL Comments: Pt demonstrated ability to  manage LVAD equipment with verbal cues and minimal physical assistance. Stated sternal precautions      Vision                     Perception     Praxis      Cognition   Behavior During Therapy: WFL for tasks assessed/performed Overall Cognitive Status: Within Functional Limits for tasks assessed                       Extremity/Trunk Assessment               Exercises     Shoulder Instructions       General Comments      Pertinent Vitals/ Pain       Pain Assessment: Faces Faces Pain Scale: Hurts even more Pain Location: chest/abdomen Pain Descriptors / Indicators: Grimacing;Guarding Pain Intervention(s): Monitored during session;Repositioned;Premedicated before session  Home Living                                          Prior Functioning/Environment              Frequency Min 2X/week     Progress Toward Goals  OT Goals(current goals can now be found in the care plan section)  Progress towards OT goals: Progressing  toward goals  Acute Rehab OT Goals Patient Stated Goal: Go fishing. Time For Goal Achievement: 12/07/15 Potential to Achieve Goals: Good  Plan Discharge plan remains appropriate    Co-evaluation    PT/OT/SLP Co-Evaluation/Treatment: Yes Reason for Co-Treatment: For patient/therapist safety;Complexity of the patient's impairments (multi-system involvement)   OT goals addressed during session: ADL's and self-care      End of Session Equipment Utilized During Treatment: Oxygen (1L, LVAD)   Activity Tolerance Patient tolerated treatment well   Patient Left in bed;with call bell/phone within reach;with SCD's reapplied   Nurse Communication Mobility status        Time: 1610-9604 OT Time Calculation (min): 43 min  Charges: OT General Charges $OT Visit: 1 Procedure OT Treatments $Self Care/Home Management : 8-22 mins  Evern Bio 11/25/2015, 4:34 PM  [phone]

## 2015-11-25 NOTE — Progress Notes (Addendum)
Patient ID: Matthew Vaughan, male   DOB: 01-Jul-1956, 60 y.o.   MRN: [id number] HeartMate 2 Rounding Note  Subjective:    POD 3 S/P HMII LVAD.  Extubated 5/9 and swan removed.   Remains on milrinone 0.25 mcg/kg/min.   He is now on warfarin. INR 1.6.  LDH stable at 254.   Denies SOB/Orthopnea. Able to get OOB to chair.  Has pocket site pain today. MAP is higher today.  Remains in sinus tachy around 110.   LVAD INTERROGATION:  HeartMate II LVAD:  Flow 4.4 liters/min, speed 9000, power 4.9, PI 7.0.  No PI events overnight.   Objective:    Vital Signs:   Temp:  [97.7 F (36.5 C)-98.4 F (36.9 C)] 98.4 F (36.9 C) (05/12 0800) Pulse Rate:  [99-124] 105 (05/12 0900) Resp:  [12-31] 31 (05/12 1000) BP: (84-115)/(69-97) 113/90 mmHg (05/12 1000) SpO2:  [92 %-100 %] 100 % (05/12 0900) Weight:  [166 lb 7.2 oz (75.5 kg)] 166 lb 7.2 oz (75.5 kg) (05/12 0500) Last BM Date: 11/20/15 Mean arterial Pressure 90s   Intake/Output:   Intake/Output Summary (Last 24 hours) at 11/25/15 1024 Last data filed at 11/25/15 1000  Gross per 24 hour  Intake  699.6 ml  Output   3220 ml  Net -2520.4 ml     Physical Exam: CVP 8  General:  Intubated.  HEENT: icteric Neck: supple. JVP 8. Carotids 2+ bilat; no bruits. No lymphadenopathy or thryomegaly appreciated. Cor: Mechanical heart sounds with LVAD hum present. Sternal dressing intact Lungs: clear Abdomen: soft, nontender, nondistended. No hepatosplenomegaly. No bruits or masses. Good bowel sounds. Driveline: C/D/I; securement device intact and driveline incorporated Extremities: no cyanosis, clubbing, rash, edema Neuro: Alert and oriented x3 .   Telemetry: Sinus tachy around 110  Labs: Basic Metabolic Panel:  Recent Labs Lab 11/21/15 1605  11/22/15 0415 11/22/15 1527 11/22/15 1529 11/23/15 0355 11/24/15 0409 11/24/15 1928 11/25/15 0402  NA 141  < > 140 138  --  136 134* 134* 134*  K 4.5  < > 4.6 4.4  --  4.4 4.6 4.7 4.2  CL 112*  < >  109 103  --  106 102 96* 98*  CO2 23  --  21*  --   --  21* 24  --  27  GLUCOSE 92  < > 108* 106*  --  104* 105* 106* 101*  BUN 10  < > 10 10  --  CREATININE 1.03  < > 1.17 1.10 1.18 1.10 1.14 1.10 1.04  CALCIUM 8.6*  --  8.4*  --   --  8.8* 9.1  --  9.2  MG  --   < > 2.0  --  1.9 1.7 1.9  --  1.6*  PHOS  --   --  3.4  --   --  5.1* 4.6  --  4.1  < > = values in this interval not displayed.  Liver Function Tests:  Recent Labs Lab 11/20/15 0529 11/22/15 0415 11/23/15 0355 11/24/15 0409 11/25/15 0402  AST 38 82* 62* 41 32  ALT 51 ALKPHOS 87 53 55 64 70  BILITOT 1.0 5.7* 9.8* 9.3* 10.0*  PROT 7.4 5.6* 5.7* 6.2* 6.3*  ALBUMIN 2.6* 2.5* 2.5* 2.4* 2.3*   No results for input(s): LIPASE, AMYLASE in the last 168 hours. No results for input(s): AMMONIA in the last 168 hours.  CBC:  Recent Labs Lab 11/22/15 0415  11/22/15  1529 11/23/15 0355 11/24/15 0409 11/24/15 1928 11/25/15 0402  WBC 7.5  --  9.1 11.3* 9.4  --  10.1  NEUTROABS 5.4  --   --  8.8* 7.1  --  7.5  HGB 9.3*  < > 8.9* 8.6* 8.8* 11.2* 9.0*  HCT 28.1*  < > 27.3* 26.0* 26.3* 33.0* 28.3*  MCV 97.9  --  100.0 101.6* 101.5*  --  99.3  PLT 156  --  144* 142* 165  --  207  < > = values in this interval not displayed.  INR:  Recent Labs Lab 11/21/15 1535 11/22/15 0415 11/23/15 0355 11/24/15 0409 11/25/15 0402  INR 1.61* 1.49 1.51* 1.43 1.59*    Other results:  EKG:   Imaging: Dg Chest Port 1 View  11/25/2015  CLINICAL DATA:  Insertion of LVAD. EXAM: PORTABLE CHEST 1 VIEW COMPARISON:  Yesterday FINDINGS: Unchanged cardiopericardial enlargement. Partly visible LVAD. Remaining thoracic drains in stable position. New right upper extremity PICC with tip at the upper SVC. Unchanged retrocardiac opacity, presumably atelectasis. Small left apical pneumothorax that is stable from yesterday. IMPRESSION: 1. New right upper extremity PICC with tip near the upper SVC. 2. Unchanged trace left  apical pneumothorax. 3. Stable aeration.  No pulmonary edema. Electronically Signed   By: Marnee Spring M.D.   On: 11/25/2015 08:09   Dg Chest Port 1 View  11/24/2015  CLINICAL DATA:  Cardiac failure with a LVAD EXAM: PORTABLE CHEST 1 VIEW COMPARISON:  11/23/2015 FINDINGS: Cardiac shadow remains enlarged. A mediastinal drain is seen as well as left ventricular assist device inferiorly. A left thoracostomy catheter is noted. No pneumothorax is seen. Two right jugular lines are noted and stable. The lungs are well-aerated without focal infiltrate. Minimal atelectatic changes are noted in the right mid lung stable from the prior study. IMPRESSION: Overall stable appearance of the chest when compared with the prior exam. Electronically Signed   By: Alcide Clever M.D.   On: 11/24/2015 07:56     Medications:     Scheduled Medications: . acetaminophen  1,000 mg Oral Q6H   Or  . acetaminophen (TYLENOL) oral liquid 160 mg/5 mL  1,000 mg Per Tube Q6H  . aspirin EC  325 mg Oral Daily   Or  . aspirin  324 mg Per Tube Daily  . bisacodyl  10 mg Oral Daily   Or  . bisacodyl  10 mg Rectal Daily  . digoxin  0.125 mg Oral Daily  . docusate sodium  200 mg Oral Daily  . feeding supplement (ENSURE ENLIVE)  237 mL Oral BID BM  . furosemide  40 mg Intravenous Daily  . [START ON 11/26/2015] furosemide  40 mg Oral Daily  . guaiFENesin  1,200 mg Oral BID  . hydrALAZINE  13 mg Intravenous Q8H  . hydrALAZINE  10 mg Oral Q6H  . lisinopril  5 mg Oral Daily  . pantoprazole  40 mg Oral Daily  . polyethylene glycol  17 g Oral Daily  . sodium chloride flush  10-40 mL Intracatheter Q12H  . sodium chloride flush  3 mL Intravenous Q12H  . Warfarin - Pharmacist Dosing Inpatient   Does not apply q1800    Infusions: . sodium chloride Stopped (11/22/15 1500)  . sodium chloride 10 mL/hr at 11/25/15 1010  . milrinone 0.25 mcg/kg/min (11/25/15 1010)    PRN Medications: ondansetron (ZOFRAN) IV, oxyCODONE, sodium  chloride flush, sodium chloride flush, traMADol   Assessment/Plan/Discussion   1. S/P HMII LVAD POD #4:  Stable. Good co-ox at 68%, CVP 8.  He is now off norepinephrine and remains on milrinone 0.25.  MAP higher today, likely in setting of pocket site pain.  Suspect sinus tachy also related to pain. VAD parameters stable at 9000 rpm.  - Continue 325 mg aspirin until INR low threshold of goal then decrease to 81.  - Decrease milrinone to 0.125 today.  - With elevated BP, can add lisinopril 5 mg daily.  2. Acute/chronic systolic CHF:  Cardiogenic shock: Nonischemic cardiomyopathy. Good diuresis yesterday with IV Lasix.  CVP 8 today.  3. LV thrombus: Small thrombus on MRI.  On Warfarin, goal INR 2-2.5.  4. Anemia: Expected blood loss from Surgery. Hemoglobin higher at 9.  5. Rhythm: He appears to be in sinus tachycardia, HR around 110.  This may be related to pocket pain. 6. Hyperbilirubinemia: Total bilirubin has been stable 9-10 range.  Suspect this is related to CHF.  LDH is ok at 254 and no evidence for pump thrombus.   I reviewed the LVAD parameters from today, and compared the results to the patient's prior recorded data.  No programming changes were made.  The LVAD is functioning within specified parameters.  The patient performs LVAD self-test daily.  LVAD interrogation was negative for any significant power changes, alarms or PI events/speed drops.  LVAD equipment check completed and is in good working order.  Back-up equipment present.   LVAD education done on emergency procedures and precautions and reviewed exit site care.  Length of Stay: 20  [name]  11/25/2015, 10:24 AM  VAD Team --- VAD ISSUES ONLY--- Pager [phone] (7am - 7am)  Advanced Heart Failure Team  Pager [phone] (M-F; 7a - 4p)  Please contact [facility] for night-coverage after hours (4p -7a ) and weekends on [email or web address]

## 2015-11-26 ENCOUNTER — Inpatient Hospital Stay (HOSPITAL_COMMUNITY): Payer: Medicaid Other

## 2015-11-26 LAB — COMPREHENSIVE METABOLIC PANEL
ALBUMIN: 2.3 g/dL — AB (ref 3.5–5.0)
ALK PHOS: 82 U/L (ref 38–126)
ALT: 25 U/L (ref 17–63)
ANION GAP: 10 (ref 5–15)
AST: 34 U/L (ref 15–41)
BUN: 10 mg/dL (ref 6–20)
CALCIUM: 8.9 mg/dL (ref 8.9–10.3)
CHLORIDE: 96 mmol/L — AB (ref 101–111)
CO2: 28 mmol/L (ref 22–32)
Creatinine, Ser: 1.05 mg/dL (ref 0.61–1.24)
GFR calc non Af Amer: >60 mL/min (ref >60)
GLUCOSE: 115 mg/dL — AB (ref 65–99)
POTASSIUM: 4 mmol/L (ref 3.5–5.1)
SODIUM: 134 mmol/L — AB (ref 135–145)
Total Bilirubin: 5.3 mg/dL — ABNORMAL HIGH (ref 0.3–1.2)
Total Protein: 6.5 g/dL (ref 6.5–8.1)

## 2015-11-26 LAB — CARBOXYHEMOGLOBIN
CARBOXYHEMOGLOBIN: 1.8 % — AB (ref 0.5–1.5)
Methemoglobin: 0.9 % (ref 0.0–1.5)
O2 SAT: 73.7 %
TOTAL HEMOGLOBIN: 9.8 g/dL — AB (ref 13.5–18.0)

## 2015-11-26 LAB — MAGNESIUM: Magnesium: 1.5 mg/dL — ABNORMAL LOW (ref 1.7–2.4)

## 2015-11-26 LAB — CBC WITH DIFFERENTIAL/PLATELET
BASOS ABS: 0 10*3/uL (ref 0.0–0.1)
BASOS PCT: 0 %
Eosinophils Absolute: 0.5 10*3/uL (ref 0.0–0.7)
Eosinophils Relative: 6 %
HEMATOCRIT: 29.2 % — AB (ref 39.0–52.0)
HEMOGLOBIN: 9.5 g/dL — AB (ref 13.0–17.0)
LYMPHS PCT: 11 %
Lymphs Abs: 1 10*3/uL (ref 0.7–4.0)
MCH: 32 pg (ref 26.0–34.0)
MCHC: 32.5 g/dL (ref 30.0–36.0)
MCV: 98.3 fL (ref 78.0–100.0)
MONO ABS: 1.1 10*3/uL — AB (ref 0.1–1.0)
Monocytes Relative: 12 %
NEUTROS ABS: 6.3 10*3/uL (ref 1.7–7.7)
NEUTROS PCT: 71 %
Platelets: 249 10*3/uL (ref 150–400)
RBC: 2.97 MIL/uL — ABNORMAL LOW (ref 4.22–5.81)
RDW: 14.6 % (ref 11.5–15.5)
WBC: 9 10*3/uL (ref 4.0–10.5)

## 2015-11-26 LAB — PROTIME-INR
INR: 2.34 — AB (ref 0.00–1.49)
PROTHROMBIN TIME: 25.4 s — AB (ref 11.6–15.2)

## 2015-11-26 LAB — LACTATE DEHYDROGENASE: LDH: 234 U/L — AB (ref 98–192)

## 2015-11-26 MED ORDER — MAGNESIUM SULFATE 50 % IJ SOLN
3.0000 g | Freq: Once | INTRAMUSCULAR | Status: AC
  Administered 2015-11-26: 3 g via INTRAVENOUS
  Filled 2015-11-26: qty 6

## 2015-11-26 MED ORDER — WARFARIN SODIUM 2.5 MG PO TABS
2.5000 mg | ORAL_TABLET | Freq: Once | ORAL | Status: AC
Start: 2015-11-26 — End: 2015-11-26
  Administered 2015-11-26: 2.5 mg via ORAL
  Filled 2015-11-26: qty 1

## 2015-11-26 MED ORDER — POTASSIUM CHLORIDE CRYS ER 20 MEQ PO TBCR
20.0000 meq | EXTENDED_RELEASE_TABLET | Freq: Every day | ORAL | Status: DC
  Administered 2015-11-26 – 2015-12-04 (×9): 20 meq via ORAL
  Filled 2015-11-26 (×10): qty 1

## 2015-11-26 MED ORDER — ASPIRIN 81 MG PO CHEW
81.0000 mg | CHEWABLE_TABLET | Freq: Every day | ORAL | Status: DC
  Administered 2015-11-26 – 2015-12-06 (×11): 81 mg via ORAL
  Filled 2015-11-26 (×11): qty 1

## 2015-11-26 NOTE — Progress Notes (Signed)
HeartMate 2 Rounding Note  Subjective:   Postop day 5 HeartMate 2 implantation for acute on chronic systolic heart failure, nonischemic cardiomyopathy requiring 2 inotropes  The patient had a stable night Mean arterial pressure is 80-90 on po lisinopril Chest x-rays clear, chest tubes drainage decreased-we will remove left pleural chest tube and leave pocket drain  Stable VAD flows and PI, no PI events Sinus tachycardia improved with less pain Excellent diuresis with by mouth Lasix 40 mg daily  Patient has been ambulating in hallway Surgical incisions clean and dry INR 2.4-Coumadin dose per pharmacy Plan on transferring to stepdown Milrinone has been weaned off-PI remains greater than 5.5 with excellent VAD flow rates   LVAD INTERROGATION:  HeartMate II LVAD:  Flow 5.5 liters/min, speed 9000 rpm power 5.1, PI 5.8  Controller Serial # unchanged   Objective:    Vital Signs:   Temp:  [97.3 F (36.3 C)-97.9 F (36.6 C)] 97.6 F (36.4 C) (05/13 1715) Pulse Rate:  [95-108] 97 (05/13 1600) Resp:  [14-26] 20 (05/13 1600) BP: (89)/(73) 89/73 mmHg (05/13 0400) SpO2:  [94 %-100 %] 97 % (05/13 1600) Weight:  [164 lb 3.9 oz (74.5 kg)] 164 lb 3.9 oz (74.5 kg) (05/13 0500) Last BM Date: 11/20/15 Mean arterial Pressure104  Intake/Output:   Intake/Output Summary (Last 24 hours) at 11/26/15 1717 Last data filed at 11/26/15 1600  Gross per 24 hour  Intake  645.5 ml  Output    555 ml  Net   90.5 ml     Physical Exam: General:  Well appearing. No resp difficulty HEENT: normal Neck: supple. JVP .  no bruits. No lymphadenopathy or thryomegaly appreciated. Cor: Mechanical heart sounds with LVAD hum present. Lungs: clear Abdomen: soft, nontender, nondistended. No hepatosplenomegaly. No bruits or masses. Good bowel sounds. Extremities: no cyanosis, clubbing, rash, edema Neuro: alert & orientedx3, cranial nerves grossly intact. moves all 4 extremities w/o difficulty. Affect  pleasant  Telemetry: Sinus tachycardia heart rate 105  Labs: Basic Metabolic Panel:  Recent Labs Lab 11/22/15 0415  11/22/15 1529 11/23/15 0355 11/24/15 0409 11/24/15 1928 11/25/15 0402 11/26/15 0445 11/26/15 0815  NA 140  < >  --  136 134* 134* 134*  --  134*  K 4.6  < >  --  4.4 4.6 4.7 4.2  --  4.0  CL 109  < >  --  106 102 96* 98*  --  96*  CO2 21*  --   --  21* 24  --  27  --  28  GLUCOSE 108*  < >  --  104* 105* 106* 101*  --  115*  BUN 10  < >  --  10 8 13 10   --  10  CREATININE 1.17  < > 1.18 1.10 1.14 1.10 1.04  --  1.05  CALCIUM 8.4*  --   --  8.8* 9.1  --  9.2  --  8.9  MG 2.0  --  1.9 1.7 1.9  --  1.6* 1.5*  --   PHOS 3.4  --   --  5.1* 4.6  --  4.1  --   --   < > = values in this interval not displayed.  Liver Function Tests:  Recent Labs Lab 11/22/15 0415 11/23/15 0355 11/24/15 0409 11/25/15 0402 11/26/15 0815  AST 82* 62* 41 32 34  ALT 30 23 23 21 25   ALKPHOS 53 55 64 70 82  BILITOT 5.7* 9.8* 9.3* 10.0* 5.3*  PROT 5.6*  5.7* 6.2* 6.3* 6.5  ALBUMIN 2.5* 2.5* 2.4* 2.3* 2.3*   No results for input(s): LIPASE, AMYLASE in the last 168 hours. No results for input(s): AMMONIA in the last 168 hours.  CBC:  Recent Labs Lab 11/22/15 0415  11/22/15 1529 11/23/15 0355 11/24/15 0409 11/24/15 1928 11/25/15 0402 11/26/15 0445  WBC 7.5  --  9.1 11.3* 9.4  --  10.1 9.0  NEUTROABS 5.4  --   --  8.8* 7.1  --  7.5 6.3  HGB 9.3*  < > 8.9* 8.6* 8.8* 11.2* 9.0* 9.5*  HCT 28.1*  < > 27.3* 26.0* 26.3* 33.0* 28.3* 29.2*  MCV 97.9  --  100.0 101.6* 101.5*  --  99.3 98.3  PLT 156  --  144* 142* 165  --  207 249  < > = values in this interval not displayed.  INR:  Recent Labs Lab 11/22/15 0415 11/23/15 0355 11/24/15 0409 11/25/15 0402 11/26/15 0445  INR 1.49 1.51* 1.43 1.59* 2.34*    Other results:  EKG:   Imaging: Dg Chest Port 1 View  11/26/2015  CLINICAL DATA:  LVAD present. EXAM: PORTABLE CHEST 1 VIEW COMPARISON:  Nov 25, 2015 FINDINGS: A tiny  left apical pneumothorax remains. No right-sided pneumothorax. The LVAD is partially visible and in stable position. A right PICC line is unchanged. Stable cardiomegaly. The hila and mediastinum are stable. No pulmonary nodules, masses, or new infiltrates. IMPRESSION: Stable tiny left apical pneumothorax. The LVAD is in stable position. No other change. Electronically Signed   By: Gerome Sam III M.D   On: 11/26/2015 07:16   Dg Chest Port 1 View  11/25/2015  CLINICAL DATA:  Insertion of LVAD. EXAM: PORTABLE CHEST 1 VIEW COMPARISON:  Yesterday FINDINGS: Unchanged cardiopericardial enlargement. Partly visible LVAD. Remaining thoracic drains in stable position. New right upper extremity PICC with tip at the upper SVC. Unchanged retrocardiac opacity, presumably atelectasis. Small left apical pneumothorax that is stable from yesterday. IMPRESSION: 1. New right upper extremity PICC with tip near the upper SVC. 2. Unchanged trace left apical pneumothorax. 3. Stable aeration.  No pulmonary edema. Electronically Signed   By: Marnee Spring M.D.   On: 11/25/2015 08:09     Medications:     Scheduled Medications: . acetaminophen  1,000 mg Oral Q6H   Or  . acetaminophen (TYLENOL) oral liquid 160 mg/5 mL  1,000 mg Per Tube Q6H  . aspirin  81 mg Oral Daily  . bisacodyl  10 mg Oral Daily   Or  . bisacodyl  10 mg Rectal Daily  . digoxin  0.125 mg Oral Daily  . docusate sodium  200 mg Oral Daily  . feeding supplement (ENSURE ENLIVE)  237 mL Oral BID BM  . furosemide  40 mg Oral Daily  . guaiFENesin  1,200 mg Oral BID  . lisinopril  5 mg Oral Daily  . pantoprazole  40 mg Oral Daily  . polyethylene glycol  17 g Oral Daily  . potassium chloride  20 mEq Oral Daily  . sodium chloride flush  10-40 mL Intracatheter Q12H  . sodium chloride flush  3 mL Intravenous Q12H  . warfarin  2.5 mg Oral ONCE-1800  . Warfarin - Pharmacist Dosing Inpatient   Does not apply q1800    Infusions: . sodium chloride  Stopped (11/22/15 1500)  . sodium chloride 10 mL/hr at 11/26/15 0700    PRN Medications: hydrALAZINE, ondansetron (ZOFRAN) IV, oxyCODONE, sodium chloride flush, sodium chloride flush, traMADol   Assessment:  Excellent progress after HeartMate 2 implantation for acute on chronic systolic heart failure from idiopathic cardiomyopathy  Elevated bilirubin from heart failure induced liver dysfunction,  Bilirubin now down to 5.5  INR climbing gradually continue Coumadinperpharmacy  Removed pleural tube today Plan/Discussion:   Transfer to stepdownTomorrow continue chest tubes and Coumadin dosing Switch Lasix to by mouth 40 mg daily  Continue education regarding VAD equipment  I reviewed the LVAD parameters from today, and compared the results to the patient's prior recorded data.  No programming changes were made.  The LVAD is functioning within specified parameters.  The patient performs LVAD self-test daily.  LVAD interrogation was negative for any significant power changes, alarms or PI events/speed drops.  LVAD equipment check completed and is in good working order.  Back-up equipment present.   LVAD education done on emergency procedures and precautions and reviewed exit site care.  Length of Stay: [address]  [place] 11/26/2015, 5:17 PM

## 2015-11-26 NOTE — Progress Notes (Signed)
Patient ID: Matthew Vaughan, male   DOB: 07-10-1956, 60 y.o.   MRN: [id number] HeartMate 2 Rounding Note  Subjective:    POD 5 S/P HMII LVAD.  Extubated 5/9 and swan removed.   Remains on milrinone 0.25 mcg/kg/min.   He is now on warfarin. INR 2.3.  LDH stable at 234.  Pending CMET.  Walked with PT yesterday, progressing well with mobility.  Pocket site pain controlled with meds.  Now on lisinopril with MAP 80s.  Remains in sinus tachy around 110.  Weight down 1 lb, on po Lasix.   LVAD INTERROGATION:  HeartMate II LVAD:  Flow 4.3 liters/min, speed 9000, power 4.9, PI 6.4.  No PI events overnight.   Objective:    Vital Signs:   Temp:  [97.4 F (36.3 C)-98.4 F (36.9 C)] 97.9 F (36.6 C) (05/12 2335) Pulse Rate:  [95-112] 103 (05/13 0400) Resp:  [14-31] 18 (05/13 0400) BP: (89-113)/(73-97) 89/73 mmHg (05/13 0400) SpO2:  [95 %-100 %] 95 % (05/13 0600) Weight:  [164 lb 3.9 oz (74.5 kg)] 164 lb 3.9 oz (74.5 kg) (05/13 0500) Last BM Date: 11/20/15 Mean arterial Pressure 80s   Intake/Output:   Intake/Output Summary (Last 24 hours) at 11/26/15 0745 Last data filed at 11/26/15 0700  Gross per 24 hour  Intake  807.9 ml  Output   1855 ml  Net -1047.1 ml     Physical Exam: General:  Intubated.  HEENT: icteric Neck: supple. JVP not elevated. Carotids 2+ bilat; no bruits. No lymphadenopathy or thryomegaly appreciated. Cor: Mechanical heart sounds with LVAD hum present. Sternal dressing intact Lungs: clear Abdomen: soft, nontender, nondistended. No hepatosplenomegaly. No bruits or masses. Good bowel sounds. Driveline: C/D/I; securement device intact and driveline incorporated Extremities: no cyanosis, clubbing, rash, edema Neuro: Alert and oriented x3 .   Telemetry: Sinus tachy around 110  Labs: Basic Metabolic Panel:  Recent Labs Lab 11/21/15 1605  11/22/15 0415 11/22/15 1527 11/22/15 1529 11/23/15 0355 11/24/15 0409 11/24/15 1928 11/25/15 0402 11/26/15 0445  NA 141   < > 140 138  --  136 134* 134* 134*  --   K 4.5  < > 4.6 4.4  --  4.4 4.6 4.7 4.2  --   CL 112*  < > 109 103  --  106 102 96* 98*  --   CO2 23  --  21*  --   --  21* 24  --  27  --   GLUCOSE 92  < > 108* 106*  --  104* 105* 106* 101*  --   BUN 10  < > 10 10  --  10 8 13 10   --   CREATININE 1.03  < > 1.17 1.10 1.18 1.10 1.14 1.10 1.04  --   CALCIUM 8.6*  --  8.4*  --   --  8.8* 9.1  --  9.2  --   MG  --   < > 2.0  --  1.9 1.7 1.9  --  1.6* 1.5*  PHOS  --   --  3.4  --   --  5.1* 4.6  --  4.1  --   < > = values in this interval not displayed.  Liver Function Tests:  Recent Labs Lab 11/20/15 0529 11/22/15 0415 11/23/15 0355 11/24/15 0409 11/25/15 0402  AST 38 82* 62* 41 32  ALT 51 30 23 23 21   ALKPHOS 87 53 55 64 70  BILITOT 1.0 5.7* 9.8* 9.3* 10.0*  PROT 7.4 5.6* 5.7*  6.2* 6.3*  ALBUMIN 2.6* 2.5* 2.5* 2.4* 2.3*   No results for input(s): LIPASE, AMYLASE in the last 168 hours. No results for input(s): AMMONIA in the last 168 hours.  CBC:  Recent Labs Lab 11/22/15 0415  11/22/15 1529 11/23/15 0355 11/24/15 0409 11/24/15 1928 11/25/15 0402 11/26/15 0445  WBC 7.5  --  9.1 11.3* 9.4  --  10.1 9.0  NEUTROABS 5.4  --   --  8.8* 7.1  --  7.5 6.3  HGB 9.3*  < > 8.9* 8.6* 8.8* 11.2* 9.0* 9.5*  HCT 28.1*  < > 27.3* 26.0* 26.3* 33.0* 28.3* 29.2*  MCV 97.9  --  100.0 101.6* 101.5*  --  99.3 98.3  PLT 156  --  144* 142* 165  --  207 249  < > = values in this interval not displayed.  INR:  Recent Labs Lab 11/22/15 0415 11/23/15 0355 11/24/15 0409 11/25/15 0402 11/26/15 0445  INR 1.49 1.51* 1.43 1.59* 2.34*    Other results:  EKG:   Imaging: Dg Chest Port 1 View  11/26/2015  CLINICAL DATA:  LVAD present. EXAM: PORTABLE CHEST 1 VIEW COMPARISON:  Nov 25, 2015 FINDINGS: A tiny left apical pneumothorax remains. No right-sided pneumothorax. The LVAD is partially visible and in stable position. A right PICC line is unchanged. Stable cardiomegaly. The hila and mediastinum  are stable. No pulmonary nodules, masses, or new infiltrates. IMPRESSION: Stable tiny left apical pneumothorax. The LVAD is in stable position. No other change. Electronically Signed   By: Gerome Sam III M.D   On: 11/26/2015 07:16   Dg Chest Port 1 View  11/25/2015  CLINICAL DATA:  Insertion of LVAD. EXAM: PORTABLE CHEST 1 VIEW COMPARISON:  Yesterday FINDINGS: Unchanged cardiopericardial enlargement. Partly visible LVAD. Remaining thoracic drains in stable position. New right upper extremity PICC with tip at the upper SVC. Unchanged retrocardiac opacity, presumably atelectasis. Small left apical pneumothorax that is stable from yesterday. IMPRESSION: 1. New right upper extremity PICC with tip near the upper SVC. 2. Unchanged trace left apical pneumothorax. 3. Stable aeration.  No pulmonary edema. Electronically Signed   By: Marnee Spring M.D.   On: 11/25/2015 08:09     Medications:     Scheduled Medications: . acetaminophen  1,000 mg Oral Q6H   Or  . acetaminophen (TYLENOL) oral liquid 160 mg/5 mL  1,000 mg Per Tube Q6H  . aspirin  81 mg Oral Daily  . bisacodyl  10 mg Oral Daily   Or  . bisacodyl  10 mg Rectal Daily  . digoxin  0.125 mg Oral Daily  . docusate sodium  200 mg Oral Daily  . feeding supplement (ENSURE ENLIVE)  237 mL Oral BID BM  . furosemide  40 mg Intravenous Daily  . furosemide  40 mg Oral Daily  . guaiFENesin  1,200 mg Oral BID  . lisinopril  5 mg Oral Daily  . magnesium sulfate 1 - 4 g bolus IVPB  3 g Intravenous Once  . pantoprazole  40 mg Oral Daily  . polyethylene glycol  17 g Oral Daily  . sodium chloride flush  10-40 mL Intracatheter Q12H  . sodium chloride flush  3 mL Intravenous Q12H  . Warfarin - Pharmacist Dosing Inpatient   Does not apply q1800    Infusions: . sodium chloride Stopped (11/22/15 1500)  . sodium chloride 10 mL/hr at 11/26/15 0700    PRN Medications: hydrALAZINE, ondansetron (ZOFRAN) IV, oxyCODONE, sodium chloride flush, sodium  chloride flush, traMADol  Assessment/Plan/Discussion   1. S/P HMII LVAD POD #5: Stable. Good co-ox at 74%.  He is now off norepinephrine and remains on milrinone 0.125.  MAP 80s.  Suspect sinus tachy related to pain. VAD parameters stable at 9000 rpm.  - INR at goal (2-2.5).  Can decrease ASA to 81 daily.  - Stop milrinone today.  - BP good on lisinopril 5 daily.  2. Acute/chronic systolic CHF:  Cardiogenic shock: Nonischemic cardiomyopathy. Transition to po Lasix today.  3. LV thrombus: Small thrombus on MRI.  On Warfarin, goal INR 2-2.5.  4. Anemia: Expected blood loss from Surgery. Hemoglobin higher at 9.5.  5. Rhythm: He appears to be in sinus tachycardia, HR around 110.  This may be related to pocket pain. 6. Hyperbilirubinemia: Total bilirubin has been stable 9-10 range.  Suspect this is related to CHF.  LDH is ok at 234 and no evidence for pump thrombus. Awaiting today's CMET.   I reviewed the LVAD parameters from today, and compared the results to the patient's prior recorded data.  No programming changes were made.  The LVAD is functioning within specified parameters.  The patient performs LVAD self-test daily.  LVAD interrogation was negative for any significant power changes, alarms or PI events/speed drops.  LVAD equipment check completed and is in good working order.  Back-up equipment present.   LVAD education done on emergency procedures and precautions and reviewed exit site care.  Length of Stay: 21  [name]  11/26/2015, 7:45 AM  VAD Team --- VAD ISSUES ONLY--- Pager [phone] (7am - 7am)  Advanced Heart Failure Team  Pager [phone] (M-F; 7a - 4p)  Please contact [facility] for night-coverage after hours (4p -7a ) and weekends on [email or web address]

## 2015-11-26 NOTE — Progress Notes (Signed)
ANTICOAGULATION CONSULT NOTE - Follow Up Consult  Pharmacy Consult for Warfarin Indication: LV thrombus / LVAD  No Known Allergies  Patient Measurements: Height: 5\' 11"  (180.3 cm) Weight: 164 lb 3.9 oz (74.5 kg) IBW/kg (Calculated) : 75.3  Vital Signs: Temp: 97.3 F (36.3 C) (05/13 1234) Temp Source: Oral (05/13 1234) BP: 89/73 mmHg (05/13 0400) Pulse Rate: 96 (05/13 1200)  Labs:  Recent Labs  11/24/15 0409 11/24/15 1928 11/25/15 0402 11/26/15 0445 11/26/15 0815  HGB 8.8* 11.2* 9.0* 9.5*  --   HCT 26.3* 33.0* 28.3* 29.2*  --   PLT 165  --  207 249  --   LABPROT 17.5*  --  19.0* 25.4*  --   INR 1.43  --  1.59* 2.34*  --   CREATININE 1.14 1.10 1.04  --  1.05    Estimated Creatinine Clearance: 80.8 mL/min (by C-G formula based on Cr of 1.05).  Assessment: 60 yo M on warfarin PTA for hx LV thrombus, transitioned to IV heparin on admission pending cath. S/P cath 4/24, found to have NICM, resumed heparin as bridge to LVAD-S/P LVAD on 5/8.  Warfarin resumed 5/9 after discussion with MD.  INR 1.59>2.34. Hgb 9.5, plts 249- no bleeding or complications noted.  Goal of Therapy:  INR 2-2.5 Monitor platelets by anticoagulation protocol: Yes   Plan:  Coumadin 2.5 mg x1 tonight  Daily INR Aspirin reduced to 81mg  by MD today with INR at goal  Sala Tague D. Tametria Aho, PharmD, BCPS Clinical Pharmacist Pager: [phone] 11/26/2015 12:37 PM

## 2015-11-27 ENCOUNTER — Inpatient Hospital Stay (HOSPITAL_COMMUNITY): Payer: Medicaid Other

## 2015-11-27 LAB — CBC WITH DIFFERENTIAL/PLATELET
BASOS PCT: 0 %
Basophils Absolute: 0 10*3/uL (ref 0.0–0.1)
EOS ABS: 0.6 10*3/uL (ref 0.0–0.7)
EOS PCT: 7 %
HCT: 27.8 % — ABNORMAL LOW (ref 39.0–52.0)
HEMOGLOBIN: 9.5 g/dL — AB (ref 13.0–17.0)
LYMPHS ABS: 1.3 10*3/uL (ref 0.7–4.0)
Lymphocytes Relative: 16 %
MCH: 33.8 pg (ref 26.0–34.0)
MCHC: 34.2 g/dL (ref 30.0–36.0)
MCV: 98.9 fL (ref 78.0–100.0)
MONO ABS: 1 10*3/uL (ref 0.1–1.0)
MONOS PCT: 13 %
Neutro Abs: 5.1 10*3/uL (ref 1.7–7.7)
Neutrophils Relative %: 64 %
Platelets: 284 10*3/uL (ref 150–400)
RBC: 2.81 MIL/uL — ABNORMAL LOW (ref 4.22–5.81)
RDW: 14.7 % (ref 11.5–15.5)
WBC: 8 10*3/uL (ref 4.0–10.5)

## 2015-11-27 LAB — COMPREHENSIVE METABOLIC PANEL
ALK PHOS: 96 U/L (ref 38–126)
ALT: 25 U/L (ref 17–63)
ANION GAP: 10 (ref 5–15)
AST: 31 U/L (ref 15–41)
Albumin: 2.2 g/dL — ABNORMAL LOW (ref 3.5–5.0)
BILIRUBIN TOTAL: 3.5 mg/dL — AB (ref 0.3–1.2)
BUN: 7 mg/dL (ref 6–20)
CALCIUM: 8.6 mg/dL — AB (ref 8.9–10.3)
CO2: 27 mmol/L (ref 22–32)
CREATININE: 0.99 mg/dL (ref 0.61–1.24)
Chloride: 96 mmol/L — ABNORMAL LOW (ref 101–111)
GFR calc non Af Amer: >60 mL/min (ref >60)
GLUCOSE: 111 mg/dL — AB (ref 65–99)
Potassium: 4.2 mmol/L (ref 3.5–5.1)
SODIUM: 133 mmol/L — AB (ref 135–145)
TOTAL PROTEIN: 6.7 g/dL (ref 6.5–8.1)

## 2015-11-27 LAB — LACTATE DEHYDROGENASE: LDH: 228 U/L — AB (ref 98–192)

## 2015-11-27 LAB — PROTIME-INR
INR: 2.46 — ABNORMAL HIGH (ref 0.00–1.49)
Prothrombin Time: 26.4 seconds — ABNORMAL HIGH (ref 11.6–15.2)

## 2015-11-27 LAB — MAGNESIUM: MAGNESIUM: 1.8 mg/dL (ref 1.7–2.4)

## 2015-11-27 MED ORDER — MAGNESIUM SULFATE 2 GM/50ML IV SOLN
2.0000 g | Freq: Once | INTRAVENOUS | Status: AC
  Administered 2015-11-27: 2 g via INTRAVENOUS
  Filled 2015-11-27: qty 50

## 2015-11-27 MED ORDER — WARFARIN SODIUM 2.5 MG PO TABS
2.5000 mg | ORAL_TABLET | Freq: Once | ORAL | Status: AC
  Administered 2015-11-27: 2.5 mg via ORAL
  Filled 2015-11-27: qty 1

## 2015-11-27 MED ORDER — FENTANYL 50 MCG/HR TD PT72
75.0000 ug | MEDICATED_PATCH | TRANSDERMAL | Status: DC
  Administered 2015-11-27 – 2015-12-06 (×4): 75 ug via TRANSDERMAL
  Filled 2015-11-27 (×5): qty 1

## 2015-11-27 NOTE — Progress Notes (Signed)
Pt received from ICU. Pt VSS, CCMD notified x2. Pt oriented to room and equipment. Call light within reach. Will continue to monitor.   Leonidas Romberg, RN

## 2015-11-27 NOTE — Progress Notes (Signed)
ANTICOAGULATION CONSULT NOTE - Follow Up Consult  Pharmacy Consult for Warfarin Indication: LV thrombus / LVAD  No Known Allergies  Patient Measurements: Height: 5\' 11"  (180.3 cm) Weight: 162 lb 7.7 oz (73.7 kg) IBW/kg (Calculated) : 75.3  Vital Signs: Temp: 97.6 F (36.4 C) (05/14 1305) Temp Source: Oral (05/14 1305) Pulse Rate: 106 (05/14 1400)  Labs:  Recent Labs  11/25/15 0402 11/26/15 0445 11/26/15 0815 11/27/15 0510  HGB 9.0* 9.5*  --  9.5*  HCT 28.3* 29.2*  --  27.8*  PLT 207 249  --  284  LABPROT 19.0* 25.4*  --  26.4*  INR 1.59* 2.34*  --  2.46*  CREATININE 1.04  --  1.05 0.99    Estimated Creatinine Clearance: 84.8 mL/min (by C-G formula based on Cr of 0.99).  Assessment: 60 yo M on warfarin PTA for hx LV thrombus, transitioned to IV heparin on admission pending cath. S/P cath 4/24, found to have NICM, resumed heparin as bridge to LVAD-S/P LVAD on 5/8.  Warfarin resumed 5/9 after discussion with MD.  INR currently 2.46. ASA 81mg . Hgb 9.5, plts 284- no bleeding or complications noted.  Goal of Therapy:  INR 2-2.5 Monitor platelets by anticoagulation protocol: Yes   Plan:  Coumadin 2.5 mg x1 tonight  Daily INR  Matthew Vaughan D. Matthew Vaughan, PharmD, BCPS Clinical Pharmacist Pager: [phone] 11/27/2015 2:24 PM

## 2015-11-27 NOTE — Plan of Care (Signed)
Problem: Activity: Goal: Risk for activity intolerance will decrease Outcome: Progressing Pt ambulating well  Problem: Bowel/Gastric: Goal: Gastrointestinal status for postoperative course will improve Outcome: Progressing Pt with flatus, no BM yet, bowel regimen in place     Problem: Education: Goal: Knowledge of disease or condition will improve Outcome: Progressing Pt taught family about his LVAD today, excited and knowledgeable     Problem: Pain Management: Goal: Pain level will decrease Outcome: Not Progressing Pt still has pain control issues

## 2015-11-27 NOTE — Progress Notes (Signed)
11/27/2015 1230 Pocket drain d.c per order. Pt. Tolerated well.  Baruch Lewers, Blanchard Kelch

## 2015-11-27 NOTE — Progress Notes (Signed)
HeartMate 2 Rounding Note  Subjective:    POD 5 S/P HMII LVAD.  Extubated 5/9 and swan removed.   Milrinone off.   He is now on warfarin. INR 2.46.  LDH stable at 228.  Bilirubin falling.  Walked with PT yesterday, progressing well with mobility.  Pocket site pain controlled with meds.  Now on lisinopril with MAP 80s.  Remains in sinus tachy around 100.    LVAD INTERROGATION:  HeartMate II LVAD:  Flow 4.5 liters/min, speed 9000, power 4.9, PI 6.6.  No PI events overnight.   Objective:    Vital Signs:   Temp:  [97.3 F (36.3 C)-98.4 F (36.9 C)] 98.3 F (36.8 C) (05/14 0420) Pulse Rate:  [93-105] 99 (05/14 0700) Resp:  [15-28] 15 (05/14 0700) SpO2:  [93 %-99 %] 96 % (05/14 0700) Last BM Date: 11/20/15 Mean arterial Pressure 80s   Intake/Output:   Intake/Output Summary (Last 24 hours) at 11/27/15 0756 Last data filed at 11/27/15 0600  Gross per 24 hour  Intake  662.7 ml  Output    765 ml  Net -102.3 ml     Physical Exam: General:  Intubated.  HEENT: icteric Neck: supple. JVP not elevated. Carotids 2+ bilat; no bruits. No lymphadenopathy or thryomegaly appreciated. Cor: Mechanical heart sounds with LVAD hum present. Sternal dressing intact Lungs: clear Abdomen: soft, nontender, nondistended. No hepatosplenomegaly. No bruits or masses. Good bowel sounds. Driveline: C/D/I; securement device intact and driveline incorporated Extremities: no cyanosis, clubbing, rash, edema Neuro: Alert and oriented x3 .   Telemetry: Sinus tachy around 100  Labs: Basic Metabolic Panel:  Recent Labs Lab 11/22/15 0415  11/23/15 0355 11/24/15 0409 11/24/15 1928 11/25/15 0402 11/26/15 0445 11/26/15 0815 11/27/15 0510  NA 140  < > 136 134* 134* 134*  --  134* 133*  K 4.6  < > 4.4 4.6 4.7 4.2  --  4.0 4.2  CL 109  < > 106 102 96* 98*  --  96* 96*  CO2 21*  --  21* 24  --  27  --  28 27  GLUCOSE 108*  < > 104* 105* 106* 101*  --  115* 111*  BUN 10  < > --  10 7   CREATININE 1.17  < > 1.10 1.14 1.10 1.04  --  1.05 0.99  CALCIUM 8.4*  --  8.8* 9.1  --  9.2  --  8.9 8.6*  MG 2.0  < > 1.7 1.9  --  1.6* 1.5*  --  1.8  PHOS 3.4  --  5.1* 4.6  --  4.1  --   --   --   < > = values in this interval not displayed.  Liver Function Tests:  Recent Labs Lab 11/23/15 0355 11/24/15 0409 11/25/15 0402 11/26/15 0815 11/27/15 0510  AST 62* 41 32 34 31  ALT ALKPHOS 55 64 70 82 96  BILITOT 9.8* 9.3* 10.0* 5.3* 3.5*  PROT 5.7* 6.2* 6.3* 6.5 6.7  ALBUMIN 2.5* 2.4* 2.3* 2.3* 2.2*   No results for input(s): LIPASE, AMYLASE in the last 168 hours. No results for input(s): AMMONIA in the last 168 hours.  CBC:  Recent Labs Lab 11/23/15 0355 11/24/15 0409 11/24/15 1928 11/25/15 0402 11/26/15 0445 11/27/15 0510  WBC 11.3* 9.4  --  10.1 9.0 8.0  NEUTROABS 8.8* 7.1  --  7.5 6.3 5.1  HGB 8.6* 8.8* 11.2* 9.0* 9.5* 9.5*  HCT  26.0* 26.3* 33.0* 28.3* 29.2* 27.8*  MCV 101.6* 101.5*  --  99.3 98.3 98.9  PLT 142* 165  --  207 249 284    INR:  Recent Labs Lab 11/23/15 0355 11/24/15 0409 11/25/15 0402 11/26/15 0445 11/27/15 0510  INR 1.51* 1.43 1.59* 2.34* 2.46*    Other results:  EKG:   Imaging: Dg Chest Port 1 View  11/26/2015  CLINICAL DATA:  LVAD present. EXAM: PORTABLE CHEST 1 VIEW COMPARISON:  Nov 25, 2015 FINDINGS: A tiny left apical pneumothorax remains. No right-sided pneumothorax. The LVAD is partially visible and in stable position. A right PICC line is unchanged. Stable cardiomegaly. The hila and mediastinum are stable. No pulmonary nodules, masses, or new infiltrates. IMPRESSION: Stable tiny left apical pneumothorax. The LVAD is in stable position. No other change. Electronically Signed   By: Gerome Sam III M.D   On: 11/26/2015 07:16     Medications:     Scheduled Medications: . aspirin  81 mg Oral Daily  . bisacodyl  10 mg Oral Daily   Or  . bisacodyl  10 mg Rectal Daily  . digoxin  0.125 mg Oral Daily  .  docusate sodium  200 mg Oral Daily  . feeding supplement (ENSURE ENLIVE)  237 mL Oral BID BM  . furosemide  40 mg Oral Daily  . guaiFENesin  1,200 mg Oral BID  . lisinopril  5 mg Oral Daily  . magnesium sulfate 1 - 4 g bolus IVPB  2 g Intravenous Once  . pantoprazole  40 mg Oral Daily  . polyethylene glycol  17 g Oral Daily  . potassium chloride  20 mEq Oral Daily  . sodium chloride flush  10-40 mL Intracatheter Q12H  . sodium chloride flush  3 mL Intravenous Q12H  . Warfarin - Pharmacist Dosing Inpatient   Does not apply q1800    Infusions: . sodium chloride Stopped (11/22/15 1500)  . sodium chloride 10 mL/hr at 11/26/15 0700    PRN Medications: hydrALAZINE, ondansetron (ZOFRAN) IV, oxyCODONE, sodium chloride flush, sodium chloride flush, traMADol   Assessment/Plan/Discussion   1. S/P HMII LVAD POD #5: Stable. Good co-ox at 74%.  He is now off norepinephrine and milrinone.  MAP 80s.  HR now around 100, ST. VAD parameters stable at 9000 rpm.  - INR at goal (2-2.5).  ASA 81.  - Ramp echo early this week.  - BP good on lisinopril 5 daily.  2. Acute/chronic systolic CHF:  Cardiogenic shock. Nonischemic cardiomyopathy. Continue po Lasix, volume looks ok.  3. LV thrombus: Small thrombus on MRI.  On Warfarin, goal INR 2-2.5.  4. Anemia: Expected blood loss from surgery. Hemoglobin stable.  5. Rhythm: He appears to be in sinus tachycardia, HR around 100.  This may be related to pocket pain. 6. Hyperbilirubinemia: Total bilirubin 9-10 range post-op, now trending down (3.5 today).  Suspect this is related to CHF.  LDH is ok and no evidence for pump thrombus.   I reviewed the LVAD parameters from today, and compared the results to the patient's prior recorded data.  No programming changes were made.  The LVAD is functioning within specified parameters.  The patient performs LVAD self-test daily.  LVAD interrogation was negative for any significant power changes, alarms or PI events/speed  drops.  LVAD equipment check completed and is in good working order.  Back-up equipment present.   LVAD education done on emergency procedures and precautions and reviewed exit site care.  Length of Stay: 22  [name]  11/27/2015, 7:56 AM  VAD Team --- VAD ISSUES ONLY--- Pager [phone] (7am - 7am)  Advanced Heart Failure Team  Pager [phone] (M-F; 7a - 4p)  Please contact [facility] for night-coverage after hours (4p -7a ) and weekends on [email or web address]

## 2015-11-27 NOTE — Progress Notes (Signed)
HeartMate 2 Rounding Note  Subjective:   Postop day 6 HeartMate 2 implantation for acute on chronic systolic heart failure, nonischemic cardiomyopathy requiring 2 inotropes  The patient had a stable night Mean arterial pressure is 80-90 on po lisinopril Chest x-rays clear, chest tubes drainage decreased-we will remove Pocket drain Stable VAD flows and PI, no PI events Sinus tachycardia improved with less pain Excellent diuresis with by mouth Lasix 40 mg daily  Patient has been ambulating in hallway Surgical incisions clean and dry INR 2.4-Coumadin dose per pharmacy Plan on transferring to stepdown today Milrinone has been weaned off-PI remains greater than 5.5 with excellent VAD flow rates, co-OX excellent this a.m. off milrinone   LVAD INTERROGATION:  HeartMate II LVAD:  Flow 5.5 liters/min, speed 9000 rpm power 5.1, PI 5.8  Controller Serial # unchanged   Objective:    Vital Signs:   Temp:  [97.3 F (36.3 C)-98.4 F (36.9 C)] 98.3 F (36.8 C) (05/14 0830) Pulse Rate:  [93-104] 99 (05/14 1000) Resp:  [15-28] 19 (05/14 1000) SpO2:  [93 %-99 %] 96 % (05/14 1000) Weight:  [162 lb 7.7 oz (73.7 kg)] 162 lb 7.7 oz (73.7 kg) (05/14 0800) Last BM Date: 11/20/15 Mean arterial Pressure95  Intake/Output:   Intake/Output Summary (Last 24 hours) at 11/27/15 1047 Last data filed at 11/27/15 1000  Gross per 24 hour  Intake    530 ml  Output   1140 ml  Net   -610 ml     Physical Exam: General:  Well appearing. No resp difficulty HEENT: normal Neck: supple. JVP .  no bruits. No lymphadenopathy or thryomegaly appreciated. Cor: Mechanical heart sounds with LVAD hum present. Lungs: clear Abdomen: soft, nontender, nondistended. No hepatosplenomegaly. No bruits or masses. Good bowel sounds. Extremities: no cyanosis, clubbing, rash, edema Neuro: alert & orientedx3, cranial nerves grossly intact. moves all 4 extremities w/o difficulty. Affect pleasant  Telemetry: Sinus tachycardia  heart rate 105  Labs: Basic Metabolic Panel:  Recent Labs Lab 11/22/15 0415  11/23/15 0355 11/24/15 0409 11/24/15 1928 11/25/15 0402 11/26/15 0445 11/26/15 0815 11/27/15 0510  NA 140  < > 136 134* 134* 134*  --  134* 133*  K 4.6  < > 4.4 4.6 4.7 4.2  --  4.0 4.2  CL 109  < > 106 102 96* 98*  --  96* 96*  CO2 21*  --  21* 24  --  27  --  28 27  GLUCOSE 108*  < > 104* 105* 106* 101*  --  115* 111*  BUN 10  < > 10 8 13 10   --  10 7  CREATININE 1.17  < > 1.10 1.14 1.10 1.04  --  1.05 0.99  CALCIUM 8.4*  --  8.8* 9.1  --  9.2  --  8.9 8.6*  MG 2.0  < > 1.7 1.9  --  1.6* 1.5*  --  1.8  PHOS 3.4  --  5.1* 4.6  --  4.1  --   --   --   < > = values in this interval not displayed.  Liver Function Tests:  Recent Labs Lab 11/23/15 0355 11/24/15 0409 11/25/15 0402 11/26/15 0815 11/27/15 0510  AST 62* 41 32 34 31  ALT 23 23 21 25 25   ALKPHOS 55 64 70 82 96  BILITOT 9.8* 9.3* 10.0* 5.3* 3.5*  PROT 5.7* 6.2* 6.3* 6.5 6.7  ALBUMIN 2.5* 2.4* 2.3* 2.3* 2.2*   No results for input(s): LIPASE, AMYLASE  in the last 168 hours. No results for input(s): AMMONIA in the last 168 hours.  CBC:  Recent Labs Lab 11/23/15 0355 11/24/15 0409 11/24/15 1928 11/25/15 0402 11/26/15 0445 11/27/15 0510  WBC 11.3* 9.4  --  10.1 9.0 8.0  NEUTROABS 8.8* 7.1  --  7.5 6.3 5.1  HGB 8.6* 8.8* 11.2* 9.0* 9.5* 9.5*  HCT 26.0* 26.3* 33.0* 28.3* 29.2* 27.8*  MCV 101.6* 101.5*  --  99.3 98.3 98.9  PLT 142* 165  --  207 249 284    INR:  Recent Labs Lab 11/23/15 0355 11/24/15 0409 11/25/15 0402 11/26/15 0445 11/27/15 0510  INR 1.51* 1.43 1.59* 2.34* 2.46*    Other results:  EKG:   Imaging: Dg Chest Port 1 View  11/26/2015  CLINICAL DATA:  LVAD present. EXAM: PORTABLE CHEST 1 VIEW COMPARISON:  Nov 25, 2015 FINDINGS: A tiny left apical pneumothorax remains. No right-sided pneumothorax. The LVAD is partially visible and in stable position. A right PICC line is unchanged. Stable cardiomegaly.  The hila and mediastinum are stable. No pulmonary nodules, masses, or new infiltrates. IMPRESSION: Stable tiny left apical pneumothorax. The LVAD is in stable position. No other change. Electronically Signed   By: Gerome Sam III M.D   On: 11/26/2015 07:16     Medications:     Scheduled Medications: . aspirin  81 mg Oral Daily  . bisacodyl  10 mg Oral Daily   Or  . bisacodyl  10 mg Rectal Daily  . digoxin  0.125 mg Oral Daily  . docusate sodium  200 mg Oral Daily  . feeding supplement (ENSURE ENLIVE)  237 mL Oral BID BM  . furosemide  40 mg Oral Daily  . guaiFENesin  1,200 mg Oral BID  . lisinopril  5 mg Oral Daily  . pantoprazole  40 mg Oral Daily  . polyethylene glycol  17 g Oral Daily  . potassium chloride  20 mEq Oral Daily  . sodium chloride flush  10-40 mL Intracatheter Q12H  . sodium chloride flush  3 mL Intravenous Q12H  . Warfarin - Pharmacist Dosing Inpatient   Does not apply q1800    Infusions: . sodium chloride Stopped (11/22/15 1500)  . sodium chloride 10 mL/hr at 11/26/15 0700    PRN Medications: hydrALAZINE, ondansetron (ZOFRAN) IV, oxyCODONE, sodium chloride flush, sodium chloride flush, traMADol   Assessment:  Excellent progress after HeartMate 2 implantation for acute on chronic systolic heart failure from idiopathic cardiomyopathy  Elevated bilirubin from heart failure induced liver dysfunction,  Bilirubin now down to 3.5  INR climbing gradually continue Coumadinperpharmacy  Removed Pocket drain today-all tubes are out Plan/Discussion:   Transfer to stepdown Continue Lasix  by mouth 40 mg daily  Continue education regarding VAD equipment  I reviewed the LVAD parameters from today, and compared the results to the patient's prior recorded data.  No programming changes were made.  The LVAD is functioning within specified parameters.  The patient performs LVAD self-test daily.  LVAD interrogation was negative for any significant power changes,  alarms or PI events/speed drops.  LVAD equipment check completed and is in good working order.  Back-up equipment present.   LVAD education done on emergency procedures and precautions and reviewed exit site care.  Length of Stay: [address]  [name] 11/27/2015, 10:47 AM

## 2015-11-28 LAB — CBC WITH DIFFERENTIAL/PLATELET
BASOS ABS: 0 10*3/uL (ref 0.0–0.1)
Basophils Relative: 0 %
EOS PCT: 10 %
Eosinophils Absolute: 0.9 10*3/uL — ABNORMAL HIGH (ref 0.0–0.7)
HEMATOCRIT: 30.7 % — AB (ref 39.0–52.0)
Hemoglobin: 10.3 g/dL — ABNORMAL LOW (ref 13.0–17.0)
LYMPHS ABS: 1.7 10*3/uL (ref 0.7–4.0)
LYMPHS PCT: 18 %
MCH: 33.2 pg (ref 26.0–34.0)
MCHC: 33.6 g/dL (ref 30.0–36.0)
MCV: 99 fL (ref 78.0–100.0)
MONO ABS: 1.1 10*3/uL — AB (ref 0.1–1.0)
Monocytes Relative: 12 %
NEUTROS ABS: 5.4 10*3/uL (ref 1.7–7.7)
Neutrophils Relative %: 60 %
PLATELETS: 359 10*3/uL (ref 150–400)
RBC: 3.1 MIL/uL — AB (ref 4.22–5.81)
RDW: 14.6 % (ref 11.5–15.5)
WBC: 9.1 10*3/uL (ref 4.0–10.5)

## 2015-11-28 LAB — COMPREHENSIVE METABOLIC PANEL
ALBUMIN: 2.3 g/dL — AB (ref 3.5–5.0)
ALT: 27 U/L (ref 17–63)
AST: 35 U/L (ref 15–41)
Alkaline Phosphatase: 133 U/L — ABNORMAL HIGH (ref 38–126)
Anion gap: 8 (ref 5–15)
BUN: 9 mg/dL (ref 6–20)
CHLORIDE: 96 mmol/L — AB (ref 101–111)
CO2: 28 mmol/L (ref 22–32)
CREATININE: 0.99 mg/dL (ref 0.61–1.24)
Calcium: 8.4 mg/dL — ABNORMAL LOW (ref 8.9–10.3)
GFR calc Af Amer: >60 mL/min (ref >60)
GFR calc non Af Amer: >60 mL/min (ref >60)
GLUCOSE: 114 mg/dL — AB (ref 65–99)
POTASSIUM: 4.3 mmol/L (ref 3.5–5.1)
Sodium: 132 mmol/L — ABNORMAL LOW (ref 135–145)
Total Bilirubin: 2.8 mg/dL — ABNORMAL HIGH (ref 0.3–1.2)
Total Protein: 6.7 g/dL (ref 6.5–8.1)

## 2015-11-28 LAB — LACTATE DEHYDROGENASE: LDH: 232 U/L — ABNORMAL HIGH (ref 98–192)

## 2015-11-28 LAB — MAGNESIUM: Magnesium: 1.9 mg/dL (ref 1.7–2.4)

## 2015-11-28 LAB — PROTIME-INR
INR: 2.43 — AB (ref 0.00–1.49)
PROTHROMBIN TIME: 26.1 s — AB (ref 11.6–15.2)

## 2015-11-28 MED ORDER — WARFARIN SODIUM 2.5 MG PO TABS
2.5000 mg | ORAL_TABLET | Freq: Every day | ORAL | Status: DC
  Administered 2015-11-28 – 2015-11-29 (×2): 2.5 mg via ORAL
  Filled 2015-11-28 (×3): qty 1

## 2015-11-28 MED ORDER — POLYETHYLENE GLYCOL 3350 17 G PO PACK
17.0000 g | PACK | Freq: Once | ORAL | Status: AC
Start: 2015-11-28 — End: 2015-11-28
  Administered 2015-11-28: 17 g via ORAL

## 2015-11-28 MED ORDER — WARFARIN SODIUM 2.5 MG PO TABS: 2.5000 mg | ORAL_TABLET | Freq: Once | ORAL | Status: DC

## 2015-11-28 NOTE — Progress Notes (Signed)
Patient ID: Matthew Vaughan, male   DOB: October 11, 1955, 60 y.o.   MRN: [id number] HeartMate 2 Rounding Note  Subjective:    POD 5 S/P HMII LVAD.  Extubated 5/9 and swan removed. Chest tubes all out.   Milrinone off.   He is now on warfarin. INR 2.46.  LDH stable at 232.  Bilirubin falling.  Walking in halls, progressing well with mobility.  Pocket site pain controlled with meds if he does not get behind on the meds.  Now on lisinopril with MAP 80s-90.  Remains in sinus tachy around 100, HR a little higher this morning but no pain meds since last night and hurting.    LVAD INTERROGATION:  HeartMate II LVAD:  Flow 4.4 liters/min, speed 9000, power 4.9, PI 5.9.  No PI events overnight.   Objective:    Vital Signs:   Temp:  [97.6 F (36.4 C)-98.6 F (37 C)] 98.6 F (37 C) (05/15 0414) Pulse Rate:  [97-106] 97 (05/15 0414) Resp:  [18-30] 20 (05/15 0414) BP: (91-100)/(76-87) 100/87 mmHg (05/15 0414) SpO2:  [94 %-100 %] 98 % (05/15 0414) Weight:  [172 lb 2.9 oz (78.1 kg)] 172 lb 2.9 oz (78.1 kg) (05/15 0419) Last BM Date: 11/20/15 Mean arterial Pressure 80s   Intake/Output:   Intake/Output Summary (Last 24 hours) at 11/28/15 0826 Last data filed at 11/27/15 1600  Gross per 24 hour  Intake    570 ml  Output    510 ml  Net     60 ml     Physical Exam: General:  Intubated.  HEENT: Normal Neck: supple. JVP not elevated. Carotids 2+ bilat; no bruits. No lymphadenopathy or thryomegaly appreciated. Cor: Mechanical heart sounds with LVAD hum present. Sternal dressing intact Lungs: clear Abdomen: soft, nontender, nondistended. No hepatosplenomegaly. No bruits or masses. Good bowel sounds. Driveline: C/D/I; securement device intact and driveline incorporated Extremities: no cyanosis, clubbing, rash, edema Neuro: Alert and oriented x3 .   Telemetry: Sinus tachy 100s-110s  Labs: Basic Metabolic Panel:  Recent Labs Lab 11/22/15 0415  11/23/15 0355 11/24/15 0409 11/24/15 1928  11/25/15 0402 11/26/15 0445 11/26/15 0815 11/27/15 0510 11/28/15 0536  NA 140  < > 136 134* 134* 134*  --  134* 133* 132*  K 4.6  < > 4.4 4.6 4.7 4.2  --  4.0 4.2 4.3  CL 109  < > 106 102 96* 98*  --  96* 96* 96*  CO2 21*  --  21* 24  --  27  --  28 27 28   GLUCOSE 108*  < > 104* 105* 106* 101*  --  115* 111* 114*  BUN 10  < > 10 8 13 10   --  10 7 9   CREATININE 1.17  < > 1.10 1.14 1.10 1.04  --  1.05 0.99 0.99  CALCIUM 8.4*  --  8.8* 9.1  --  9.2  --  8.9 8.6* 8.4*  MG 2.0  < > 1.7 1.9  --  1.6* 1.5*  --  1.8 1.9  PHOS 3.4  --  5.1* 4.6  --  4.1  --   --   --   --   < > = values in this interval not displayed.  Liver Function Tests:  Recent Labs Lab 11/24/15 0409 11/25/15 0402 11/26/15 0815 11/27/15 0510 11/28/15 0536  AST 41 32 34 31 35  ALT 23 21 25 25 27   ALKPHOS 64 70 82 96 133*  BILITOT 9.3* 10.0* 5.3* 3.5* 2.8*  PROT  6.2* 6.3* 6.5 6.7 6.7  ALBUMIN 2.4* 2.3* 2.3* 2.2* 2.3*   No results for input(s): LIPASE, AMYLASE in the last 168 hours. No results for input(s): AMMONIA in the last 168 hours.  CBC:  Recent Labs Lab 11/24/15 0409 11/24/15 1928 11/25/15 0402 11/26/15 0445 11/27/15 0510 11/28/15 0536  WBC 9.4  --  10.1 9.0 8.0 9.1  NEUTROABS 7.1  --  7.5 6.3 5.1 5.4  HGB 8.8* 11.2* 9.0* 9.5* 9.5* 10.3*  HCT 26.3* 33.0* 28.3* 29.2* 27.8* 30.7*  MCV 101.5*  --  99.3 98.3 98.9 99.0  PLT 165  --  207 249 284 359    INR:  Recent Labs Lab 11/24/15 0409 11/25/15 0402 11/26/15 0445 11/27/15 0510 11/28/15 0536  INR 1.43 1.59* 2.34* 2.46* 2.43*    Other results:  EKG:   Imaging: Dg Chest 2 View  11/27/2015  CLINICAL DATA:  LVAD in place EXAM: CHEST  2 VIEW COMPARISON:  Film from earlier in the same day FINDINGS: Left ventricular assist device is again identified and stable. A right PICC line is noted in satisfactory position. A left-sided pleural effusion is better visualized on the current exam. Apical pneumothorax is not well appreciated although the  air-fluid level would suggest some degree of hydro pneumothorax. No acute bony abnormality is noted. IMPRESSION: Hydro pneumothorax on the left. The apical component of the pneumothorax is not well appreciated on this exam. These results will be called to the ordering clinician or representative by the Radiologist Assistant, and communication documented in the PACS or zVision Dashboard. Electronically Signed   By: Alcide Clever M.D.   On: 11/27/2015 17:23   Dg Chest Port 1 View  11/27/2015  CLINICAL DATA:  Left ventricular assist device EXAM: PORTABLE CHEST 1 VIEW COMPARISON:  11/26/2015 FINDINGS: Cardiomediastinal size a cardiomegaly again noted. Left ventricular assist device is unchanged in position. Left chest tube has been removed. Right arm PICC line is stable in position. Stable tiny left apical pneumothorax. Status post median sternotomy. IMPRESSION: Left ventricular assist device is unchanged in position. Left chest tube has been removed. Cardiomegaly. Status post median sternotomy. Stable tiny left apical pneumothorax. Electronically Signed   By: Natasha Mead M.D.   On: 11/27/2015 13:02     Medications:     Scheduled Medications: . aspirin  81 mg Oral Daily  . bisacodyl  10 mg Oral Daily   Or  . bisacodyl  10 mg Rectal Daily  . digoxin  0.125 mg Oral Daily  . docusate sodium  200 mg Oral Daily  . feeding supplement (ENSURE ENLIVE)  237 mL Oral BID BM  . fentaNYL  75 mcg Transdermal Q72H  . furosemide  40 mg Oral Daily  . lisinopril  5 mg Oral Daily  . pantoprazole  40 mg Oral Daily  . polyethylene glycol  17 g Oral Daily  . potassium chloride  20 mEq Oral Daily  . sodium chloride flush  10-40 mL Intracatheter Q12H  . sodium chloride flush  3 mL Intravenous Q12H  . Warfarin - Pharmacist Dosing Inpatient   Does not apply q1800    Infusions: . sodium chloride Stopped (11/22/15 1500)  . sodium chloride 10 mL/hr at 11/26/15 0700    PRN Medications: hydrALAZINE, ondansetron  (ZOFRAN) IV, oxyCODONE, sodium chloride flush, sodium chloride flush, traMADol   Assessment/Plan/Discussion   1. S/P HMII LVAD POD #5: Stable. He is now off norepinephrine and milrinone.  MAP 80s-90.  HR now around 100, ST (goes up  when in more pain). VAD parameters stable at 9000 rpm.  - INR at goal (2-2.5).  ASA 81.  - Ramp echo tomorrow.  - BP stable on lisinopril 5 daily.  2. Acute/chronic systolic CHF:  Cardiogenic shock. Nonischemic cardiomyopathy. Continue po Lasix, volume looks ok.  3. LV thrombus: Small thrombus on MRI.  On Warfarin, goal INR 2-2.5.  4. Anemia: Expected blood loss from surgery. Hemoglobin rising.  5. Rhythm: He appears to be in sinus tachycardia, HR 100s-110s.  Rises with pocket pain. 6. Hyperbilirubinemia: Total bilirubin 9-10 range post-op, now trending down (2.8 today).  Suspect this is related to CHF.  LDH is ok and no evidence for pump thrombus.  7. Disposition: Ongoing LVAD teaching, will be ready for home soon.   I reviewed the LVAD parameters from today, and compared the results to the patient's prior recorded data.  No programming changes were made.  The LVAD is functioning within specified parameters.  The patient performs LVAD self-test daily.  LVAD interrogation was negative for any significant power changes, alarms or PI events/speed drops.  LVAD equipment check completed and is in good working order.  Back-up equipment present.   LVAD education done on emergency procedures and precautions and reviewed exit site care.  Length of Stay: 23  [name]  11/28/2015, 8:26 AM  VAD Team --- VAD ISSUES ONLY--- Pager [phone] (7am - 7am)  Advanced Heart Failure Team  Pager [phone] (M-F; 7a - 4p)  Please contact [facility] for night-coverage after hours (4p -7a ) and weekends on [email or web address]

## 2015-11-28 NOTE — Progress Notes (Signed)
Utilization review completed.  

## 2015-11-28 NOTE — Progress Notes (Signed)
Patient ID: Matthew Vaughan, male   DOB: 07/20/1955, 59 y.o.   MRN: [id number]  HeartMate 2 Rounding Note  Subjective:    No complaints. Ambulating well. Pain controlled.      LVAD INTERROGATION:  HeartMate II LVAD:  Flow 4.9 liters/min, speed 9000, power 5, PI 5.4.    Objective:    Vital Signs:   Temp:  [96 F (35.6 C)-98.6 F (37 C)] 96 F (35.6 C) (05/15 1345) Pulse Rate:  [97-107] 107 (05/15 0953) Resp:  [19-20] 20 (05/15 0414) BP: (91-100)/(76-87) 100/87 mmHg (05/15 0414) SpO2:  [98 %-100 %] 98 % (05/15 0414) Weight:  [78.1 kg (172 lb 2.9 oz)] 78.1 kg (172 lb 2.9 oz) (05/15 0419) Last BM Date: 11/20/15 (surgery) Mean arterial Pressure 80  Intake/Output:   Intake/Output Summary (Last 24 hours) at 11/28/15 1901 Last data filed at 11/28/15 1200  Gross per 24 hour  Intake    240 ml  Output    800 ml  Net   -560 ml     Physical Exam: General:  Well appearing. No resp difficulty HEENT: normal Cor: normal heart sounds with LVAD hum present. Lungs: clear Chest incision healing well. Abdomen: soft, nontender, nondistended. No hepatosplenomegaly. No bruits or masses. Good bowel sounds. Extremities: no cyanosis, clubbing, rash, edema Neuro: alert & orientedx3, cranial nerves grossly intact. moves all 4 extremities w/o difficulty. Affect pleasant  Telemetry: sinus tachy 110  Labs: Basic Metabolic Panel:  Recent Labs Lab 11/22/15 0415  11/23/15 0355 11/24/15 0409 11/24/15 1928 11/25/15 0402 11/26/15 0445 11/26/15 0815 11/27/15 0510 11/28/15 0536  NA 140  < > 136 134* 134* 134*  --  134* 133* 132*  K 4.6  < > 4.4 4.6 4.7 4.2  --  4.0 4.2 4.3  CL 109  < > 106 102 96* 98*  --  96* 96* 96*  CO2 21*  --  21* 24  --  27  --  28 27 28   GLUCOSE 108*  < > 104* 105* 106* 101*  --  115* 111* 114*  BUN 10  < > 10 8 13 10   --  10 7 9   CREATININE 1.17  < > 1.10 1.14 1.10 1.04  --  1.05 0.99 0.99  CALCIUM 8.4*  --  8.8* 9.1  --  9.2  --  8.9 8.6* 8.4*  MG 2.0  < > 1.7  1.9  --  1.6* 1.5*  --  1.8 1.9  PHOS 3.4  --  5.1* 4.6  --  4.1  --   --   --   --   < > = values in this interval not displayed.  Liver Function Tests:  Recent Labs Lab 11/24/15 0409 11/25/15 0402 11/26/15 0815 11/27/15 0510 11/28/15 0536  AST 41 32 34 31 35  ALT 23 21 25 25 27   ALKPHOS 64 70 82 96 133*  BILITOT 9.3* 10.0* 5.3* 3.5* 2.8*  PROT 6.2* 6.3* 6.5 6.7 6.7  ALBUMIN 2.4* 2.3* 2.3* 2.2* 2.3*   No results for input(s): LIPASE, AMYLASE in the last 168 hours. No results for input(s): AMMONIA in the last 168 hours.  CBC:  Recent Labs Lab 11/24/15 0409 11/24/15 1928 11/25/15 0402 11/26/15 0445 11/27/15 0510 11/28/15 0536  WBC 9.4  --  10.1 9.0 8.0 9.1  NEUTROABS 7.1  --  7.5 6.3 5.1 5.4  HGB 8.8* 11.2* 9.0* 9.5* 9.5* 10.3*  HCT 26.3* 33.0* 28.3* 29.2* 27.8* 30.7*  MCV 101.5*  --  99.3  98.3 98.9 99.0  PLT 165  --  207 249 284 359    INR:  Recent Labs Lab 11/24/15 0409 11/25/15 0402 11/26/15 0445 11/27/15 0510 11/28/15 0536  INR 1.43 1.59* 2.34* 2.46* 2.43*   LDH: 232  Other results:  EKG:   Imaging: Dg Chest 2 View  11/27/2015  CLINICAL DATA:  LVAD in place EXAM: CHEST  2 VIEW COMPARISON:  Film from earlier in the same day FINDINGS: Left ventricular assist device is again identified and stable. A right PICC line is noted in satisfactory position. A left-sided pleural effusion is better visualized on the current exam. Apical pneumothorax is not well appreciated although the air-fluid level would suggest some degree of hydro pneumothorax. No acute bony abnormality is noted. IMPRESSION: Hydro pneumothorax on the left. The apical component of the pneumothorax is not well appreciated on this exam. These results will be called to the ordering clinician or representative by the Radiologist Assistant, and communication documented in the PACS or zVision Dashboard. Electronically Signed   By: Alcide Clever M.D.   On: 11/27/2015 17:23   Dg Chest Port 1  View  11/27/2015  CLINICAL DATA:  Left ventricular assist device EXAM: PORTABLE CHEST 1 VIEW COMPARISON:  11/26/2015 FINDINGS: Cardiomediastinal size a cardiomegaly again noted. Left ventricular assist device is unchanged in position. Left chest tube has been removed. Right arm PICC line is stable in position. Stable tiny left apical pneumothorax. Status post median sternotomy. IMPRESSION: Left ventricular assist device is unchanged in position. Left chest tube has been removed. Cardiomegaly. Status post median sternotomy. Stable tiny left apical pneumothorax. Electronically Signed   By: Natasha Mead M.D.   On: 11/27/2015 13:02      Medications:     Scheduled Medications: . aspirin  81 mg Oral Daily  . bisacodyl  10 mg Oral Daily   Or  . bisacodyl  10 mg Rectal Daily  . digoxin  0.125 mg Oral Daily  . docusate sodium  200 mg Oral Daily  . feeding supplement (ENSURE ENLIVE)  237 mL Oral BID BM  . fentaNYL  75 mcg Transdermal Q72H  . furosemide  40 mg Oral Daily  . lisinopril  5 mg Oral Daily  . pantoprazole  40 mg Oral Daily  . potassium chloride  20 mEq Oral Daily  . sodium chloride flush  10-40 mL Intracatheter Q12H  . sodium chloride flush  3 mL Intravenous Q12H  . warfarin  2.5 mg Oral q1800  . Warfarin - Pharmacist Dosing Inpatient   Does not apply q1800     Infusions: . sodium chloride Stopped (11/22/15 1500)  . sodium chloride 10 mL/hr at 11/26/15 0700     PRN Medications:  hydrALAZINE, ondansetron (ZOFRAN) IV, oxyCODONE, sodium chloride flush, sodium chloride flush, traMADol   Assessment:   POD 7 S/P HeartMate II LVAD  1. Non-ischemic cardiomyopathy with acute on chronic systolic CHF and cardiogenic shock preop. He had a stable night and levophed weaned. 2. Expected postop acute blood loss anemia 3. Small chronic laminated basilar anterior wall LV thrombus not removed  Plan/Discussion:    He is hemodynamically stable off Milrinone. MAP better on lisinopril. Plan  Ramp echo tomorrow per cardiology.  Therapeutic on coumadin. Per pharmacy.  Continue ambulation, IS, teaching.   Pacing wires still in so will remove them tomorrow if INR stable.  I reviewed the LVAD parameters from today, and compared the results to the patient's prior recorded data.  No programming changes were made.  The LVAD is functioning within specified parameters.  The patient performs LVAD self-test daily.  LVAD interrogation was negative for any significant power changes, alarms or PI events/speed drops.  LVAD equipment check completed and is in good working order.  Back-up equipment present.   LVAD education done on emergency procedures and precautions and reviewed exit site care.  Length of Stay: [address]  Payton Doughty [facility] 11/28/2015, 7:01 PM

## 2015-11-28 NOTE — Progress Notes (Signed)
ANTICOAGULATION CONSULT NOTE - Follow Up Consult  Pharmacy Consult for Warfarin Indication: LV thrombus / LVAD  No Known Allergies  Patient Measurements: Height: 5\' 11"  (180.3 cm) Weight: 172 lb 2.9 oz (78.1 kg) IBW/kg (Calculated) : 75.3  Vital Signs: Temp: 96 F (35.6 C) (05/15 1345) Temp Source: Oral (05/15 1345) BP: 100/87 mmHg (05/15 0414) Pulse Rate: 107 (05/15 0953)  Labs:  Recent Labs  11/26/15 0445 11/26/15 0815 11/27/15 0510 11/28/15 0536  HGB 9.5*  --  9.5* 10.3*  HCT 29.2*  --  27.8* 30.7*  PLT 249  --  284 359  LABPROT 25.4*  --  26.4* 26.1*  INR 2.34*  --  2.46* 2.43*  CREATININE  --  1.05 0.99 0.99    Estimated Creatinine Clearance: 86.6 mL/min (by C-G formula based on Cr of 0.99).  Assessment: 60 yo M on warfarin PTA for hx LV thrombus, transitioned to IV heparin on admission pending cath. S/P cath 4/24, found to have NICM, resumed heparin as bridge to LVAD-S/P LVAD on 5/8.  Warfarin resumed 5/9 after discussion with MD.  INR currently 2.43. ASA 81mg . H/H stable about 10/30, plts 200s- no bleeding or complications noted.  Goal of Therapy:  INR 2-2.5 Monitor platelets by anticoagulation protocol: Yes   Plan:  Coumadin 2.5 mg daily Daily INR  Leota Sauers Pharm.D. CPP, BCPS Clinical Pharmacist [phone] 11/28/2015 4:14 PM

## 2015-11-28 NOTE — Progress Notes (Signed)
Occupational Therapy Treatment Patient Details Name: Matthew Vaughan MRN: [id number] DOB: May 28, 1956 Today's Date: 11/28/2015    History of present illness 60 y.o. male. Residence is in [place] but was visiting family in [place] when he came to hospital. Hx non-ischemic CM. EF ~ 20% historically. HTN. Chronic Coumadin for hx LV thrombus. S/p cath in 2015. Presents with Non-ischemic cardiomyopathy with acute on chronic systolic CHF and cardiogenic shock. Patient is now s/p LVAD implantation.   OT comments  Pt requiring min assist for sit to stand and min guard for ambulation with RW within room/bathroom. Stood for 2 grooming activities at sink. Can nearly reach his feet for LB bathing and dressing, but abdominal pain continues to limit. Pt recalled sternal precautions. Educated and provided energy conservation handout to reinforce.  Follow Up Recommendations  Home health OT;Supervision/Assistance - 24 hour    Equipment Recommendations  3 in 1 bedside comode    Recommendations for Other Services      Precautions / Restrictions Precautions Precautions: Fall;Sternal Precaution Comments: pt able to state sternal precautions independently Restrictions Weight Bearing Restrictions: Yes Other Position/Activity Restrictions: sternal precautions       Mobility Bed Mobility               General bed mobility comments: pt in chair, returned to chair  Transfers Overall transfer level: Needs assistance   Transfers: Sit to/from Stand Sit to Stand: Min assist         General transfer comment: steadying assist and use of momentum for sit to stand    Balance                                   ADL Overall ADL's : Needs assistance/impaired     Grooming: Min guard;Standing;Wash/dry hands;Oral care               Lower Body Dressing: Minimal assistance;Sit to/from stand Lower Body Dressing Details (indicate cue type and reason): Pt can cross his foot over  opposite knee, but not yet able to reach his feet for managing socks.  Toilet Transfer: Min guard;Ambulation;RW   Toileting- Architect and Hygiene: Min guard;Sit to/from stand       Functional mobility during ADLs: Min guard;Rolling walker General ADL Comments: Educated pt in availability of adaptive equipment for LB bathing and dressing, but may not require by discharge. Instructed in energy conservation and left handout to reinforce.      Vision                     Perception     Praxis      Cognition   Behavior During Therapy: WFL for tasks assessed/performed Overall Cognitive Status: Within Functional Limits for tasks assessed                       Extremity/Trunk Assessment               Exercises     Shoulder Instructions       General Comments      Pertinent Vitals/ Pain       Pain Assessment: Faces Faces Pain Scale: Hurts little more Pain Location: abdomen Pain Descriptors / Indicators: Sore Pain Intervention(s): Monitored during session;Premedicated before session;Repositioned;Limited activity within patient's tolerance  Home Living  Prior Functioning/Environment              Frequency Min 2X/week     Progress Toward Goals  OT Goals(current goals can now be found in the care plan section)  Progress towards OT goals: Progressing toward goals  Acute Rehab OT Goals Patient Stated Goal: Go fishing.  Plan Discharge plan remains appropriate    Co-evaluation                 End of Session Equipment Utilized During Treatment: Rolling walker   Activity Tolerance Patient tolerated treatment well   Patient Left in chair;with call bell/phone within reach   Nurse Communication          Time: 4098-1191 OT Time Calculation (min): 26 min  Charges: OT General Charges $OT Visit: 1 Procedure OT Treatments $Self Care/Home Management : 23-37  mins  Evern Bio 11/28/2015, 3:23 PM  [phone]

## 2015-11-28 NOTE — Progress Notes (Signed)
PT Cancellation Note  Patient Details Name: Matthew Vaughan MRN: [id number] DOB: 1956/02/19   Cancelled Treatment:    Reason Eval/Treat Not Completed: Other (comment) (Pain earlier and now has just amb with cardiac rehab)   [facility] 11/28/2015, 2:39 PM [facility] PT [phone]

## 2015-11-28 NOTE — Progress Notes (Signed)
CARDIAC REHAB PHASE I   PRE:  Rate/Rhythm: 103 ST    BP: sitting 82 MAP    SaO2: 96 RA  MODE:  Ambulation: 930 ft   POST:  Rate/Rhythm: 121 ST    BP: sitting 82     SaO2: 92 RA  Pt declined walking earlier due to pain. Ready to walk now. Did well with donning batteries,  Independent with x1 verbal cue. Stood with min assist and steady with rollator for long distance. Pt did take x4 short rest breaks. To recliner after walk. HR max 121 ST with some SOB. SaO2 only registered briefly at 64 RA.  [id number]  Harriet Masson CES, ACSM 11/28/2015 2:41 PM

## 2015-11-29 LAB — CARBOXYHEMOGLOBIN
Carboxyhemoglobin: 2 % — ABNORMAL HIGH (ref 0.5–1.5)
METHEMOGLOBIN: 0.8 % (ref 0.0–1.5)
O2 Saturation: 81.4 %
Total hemoglobin: 9.8 g/dL — ABNORMAL LOW (ref 13.5–18.0)

## 2015-11-29 LAB — COMPREHENSIVE METABOLIC PANEL
ALBUMIN: 2.2 g/dL — AB (ref 3.5–5.0)
ALK PHOS: 192 U/L — AB (ref 38–126)
ALT: 35 U/L (ref 17–63)
ANION GAP: 12 (ref 5–15)
AST: 52 U/L — ABNORMAL HIGH (ref 15–41)
BUN: 10 mg/dL (ref 6–20)
CHLORIDE: 95 mmol/L — AB (ref 101–111)
CO2: 27 mmol/L (ref 22–32)
CREATININE: 0.85 mg/dL (ref 0.61–1.24)
Calcium: 8.8 mg/dL — ABNORMAL LOW (ref 8.9–10.3)
GFR calc non Af Amer: >60 mL/min (ref >60)
GLUCOSE: 118 mg/dL — AB (ref 65–99)
Potassium: 4.3 mmol/L (ref 3.5–5.1)
Sodium: 134 mmol/L — ABNORMAL LOW (ref 135–145)
Total Bilirubin: 2.8 mg/dL — ABNORMAL HIGH (ref 0.3–1.2)
Total Protein: 6.5 g/dL (ref 6.5–8.1)

## 2015-11-29 LAB — CBC
HCT: 28.6 % — ABNORMAL LOW (ref 39.0–52.0)
HEMOGLOBIN: 9.3 g/dL — AB (ref 13.0–17.0)
MCH: 32.1 pg (ref 26.0–34.0)
MCHC: 32.5 g/dL (ref 30.0–36.0)
MCV: 98.6 fL (ref 78.0–100.0)
Platelets: 381 10*3/uL (ref 150–400)
RBC: 2.9 MIL/uL — AB (ref 4.22–5.81)
RDW: 14.5 % (ref 11.5–15.5)
WBC: 8 10*3/uL (ref 4.0–10.5)

## 2015-11-29 LAB — PROTIME-INR
INR: 2.21 — ABNORMAL HIGH (ref 0.00–1.49)
Prothrombin Time: 24.4 seconds — ABNORMAL HIGH (ref 11.6–15.2)

## 2015-11-29 LAB — LACTATE DEHYDROGENASE: LDH: 235 U/L — AB (ref 98–192)

## 2015-11-29 NOTE — Progress Notes (Signed)
Nutrition Follow-up  DOCUMENTATION CODES:   Non-severe (moderate) malnutrition in context of chronic illness  INTERVENTION:    Continue Ensure Enlive po BID, each supplement provides 350 kcal and 20 grams of protein  NUTRITION DIAGNOSIS:   Malnutrition related to chronic illness as evidenced by mild depletion of body fat, mild depletion of muscle mass  Ongoing  GOAL:   Patient will meet greater than or equal to 90% of their needs  Progressing  MONITOR:   PO intake, Supplement acceptance, Labs, Weight trends, I & O's  ASSESSMENT:   60 yo with history of nonischemic cardiomyopathy and apparently a prior LV thrombus presented with acute/chronic systolic CHF. He is followed by a cardiologist in [place].   Patient s/p procedures 5/8: IMPLANTATION OF HEARTMATE II LEFT VENTRICULAR DEVICE  Pt continues on a Heart Healthy diet. PO intake 50% per flowsheet records. Receiving Ensure Enlive oral nutrition supplements BID. LVAD teaching ongoing >> will be ready for home soon.  Diet Order:  Diet Heart Room service appropriate?: Yes; Fluid consistency:: Thin  Skin:  Reviewed, no issues  Last BM:  5/7  Height:   Ht Readings from Last 1 Encounters:  11/21/15 5\' 11"  (1.803 m)    Weight:   Wt Readings from Last 1 Encounters:  11/29/15 161 lb 4.8 oz (73.165 kg)    Ideal Body Weight:  78.2 kg  BMI:  Body mass index is 22.51 kg/(m^2).  Estimated Nutritional Needs:   Kcal:  1900-2100  Protein:  105-120 grams  Fluid:  1.9-2.1 L  EDUCATION NEEDS:   No education needs identified at this time  Maureen Chatters, RD, LDN Pager #: [phone] After-Hours Pager #: [phone]

## 2015-11-29 NOTE — Progress Notes (Signed)
CSW met with patient at bedside. Patient moved to regular floor over the weekend and states he is feeling great. He is pleased to have no IV's hanging and making progress. CSW discussed support group tonight and invited patient to attend. Patient declined as he stated he is tired and hoping to just relax for a bit. Patient appeared in good spirits and pleased with progress. CSW will continue to follow for support and encouragement through out recovery. CSW will follow up with mother and brother tomorrow to discuss further discharge and training needed for home. Raquel Sarna, LCSW [phone]

## 2015-11-29 NOTE — Progress Notes (Signed)
After multiple attempts to convince pt to get his EPW d/c'd, and dressing changed on his drive-line site. Pt continues to defer to later. Will readdress EPW tomorrow. And will pass on to night shift about dressing change. Pt currently in recliner with call bell and phone within reach. VSS. Will continue to monitor.

## 2015-11-29 NOTE — Progress Notes (Signed)
Physical Therapy Treatment Patient Details Name: Matthew Vaughan MRN: [id number] DOB: [birth year] Today's Date: 11/29/2015    History of Present Illness 60 y.o. male. Residence is in [place] but was visiting family in [place] when he came to hospital. Hx non-ischemic CM. EF ~ 20% historically. HTN. Chronic Coumadin for hx LV thrombus. S/p cath in 2015. Presents with Non-ischemic cardiomyopathy with acute on chronic systolic CHF and cardiogenic shock. Patient is now s/p LVAD implantation.    PT Comments    Pt admitted with above diagnosis. Pt currently with functional limitations due to balance and endurance deficits as well as for continued education. Pt was able to ambulate well in halls.  Went up and down stairs without assist and with cues only.  Not needing much assist.  Has brother and mother that will be assisting at home per pt.  Decreasing frequency to 2xweek as this is more appropriate as pt has progressed.  Will see in conjunction with cardiac rehab.   Pt will benefit from skilled PT to increase their independence and safety with mobility to allow discharge to the venue listed below.    Follow Up Recommendations  Home health PT;Supervision/Assistance - 24 hour     Equipment Recommendations  Other (comment) (rollator)    Recommendations for Other Services       Precautions / Restrictions Precautions Precautions: Fall;Sternal Precaution Comments: pt able to state sternal precautions independently Restrictions Weight Bearing Restrictions: Yes Other Position/Activity Restrictions: sternal precautions    Mobility  Bed Mobility Overal bed mobility: Needs Assistance Bed Mobility: Sit to Sidelying         Sit to sidelying: Min assist General bed mobility comments: Sitting on EOB on arrrival.  Asssisted back to bed at end of walk and had to lift LEs onto bed with pt needing min assist to lift LEs.  Transfers Overall transfer level: Needs assistance Equipment used:  4-wheeled walker Transfers: Sit to/from Stand Sit to Stand: Supervision         General transfer comment: No assist needed.  Pt able to stand without assist.   Ambulation/Gait Ambulation/Gait assistance: Supervision Ambulation Distance (Feet): 800 Feet Assistive device: 4-wheeled walker Gait Pattern/deviations: Step-through pattern;Decreased stride length   Gait velocity interpretation: Below normal speed for age/gender General Gait Details: Steady.  uses rollator to carry equipment   Stairs Stairs: Yes Stairs assistance: Min guard Stair Management: Two rails;One rail Right;Step to pattern;Forwards;Sideways Number of Stairs: 10 General stair comments: Pt was able to ascend and descend stairs with cues only and no assist physically.  Pt used bil rails to go up stairs and came down sideways holding onto right rail with bil hands and doing one step at a time.  Reported fatigue at end of walk.   Wheelchair Mobility    Modified Rankin (Stroke Patients Only)       Balance Overall balance assessment: Needs assistance Sitting-balance support: No upper extremity supported;Feet supported Sitting balance-Leahy Scale: Good     Standing balance support: During functional activity;No upper extremity supported Standing balance-Leahy Scale: Fair Standing balance comment: Pt can stand statically without support but cannot tolerate challenges to balance unless he has UE support.             High level balance activites: Direction changes;Turns;Sudden stops High Level Balance Comments: supervision to min guard assist for above.    Cognition Arousal/Alertness: Awake/alert Behavior During Therapy: WFL for tasks assessed/performed Overall Cognitive Status: Within Functional Limits for tasks assessed  Exercises General Exercises - Lower Extremity Long Arc Quad: AROM;Both;10 reps;Seated    General Comments General comments (skin integrity, edema,  etc.): Pt was able to manage equipment without cues and without assist.        Pertinent Vitals/Pain Pain Assessment: Faces Faces Pain Scale: Hurts even more Pain Location: chest and abdomen Pain Descriptors / Indicators: Sore Pain Intervention(s): Limited activity within patient's tolerance;Monitored during session;Repositioned  VSS  Home Living                      Prior Function            PT Goals (current goals can now be found in the care plan section) Progress towards PT goals: Progressing toward goals    Frequency  Min 2X/week    PT Plan Frequency needs to be updated    Co-evaluation             End of Session Equipment Utilized During Treatment: Gait belt Activity Tolerance: Patient tolerated treatment well Patient left: in bed;with call bell/phone within reach     Time: 0915-0956 PT Time Calculation (min) (ACUTE ONLY): 41 min  Charges:  $Gait Training: 23-37 mins $Therapeutic Exercise: 8-22 mins                    G CodesTawni Millers F 12-25-2015, 11:38 AM  [facility] [phone] (pager)

## 2015-11-29 NOTE — Progress Notes (Signed)
CSW met with patient at bedside to offer support. Patient spoke at length about his recovery and stated he was able to ambulate stairs today. Patient pleased with his progress and positive about the future. Patient spoke of his mother and plans for her to arrive this weekend in hopes of completing VAD training on Monday along with patient's brother. Patient appears to be in good spirits and positive about discharge. Patient apologetic about missing LVAD Support Group but again stated he was tired and needed to relax in hospital room. CSW provided support and assurance that many more group nights in the future. CSW will continue to follow throughout recovery through the VAD clinic.Raquel Sarna, LCSW [phone]

## 2015-11-29 NOTE — Progress Notes (Signed)
Patient refused to get up for daily weight this morning. He said he was "resting". He advised he would allow his weight to be taken later when he gets up.

## 2015-11-29 NOTE — Progress Notes (Signed)
Patient ID: Matthew Vaughan, male   DOB: [birth year], 60 y.o.   MRN: [id number] HeartMate 2 Rounding Note  Subjective:    He is feeling well. Keeping pocket pain under control with regular pain meds every 4 hrs.  Co-ox 81.4  LVAD INTERROGATION:  HeartMate II LVAD:  Flow 4.7 liters/min, speed 9000, power 5, PI 5.4.  No PI events  Objective:    Vital Signs:   Temp:  [96 F (35.6 C)-98.6 F (37 C)] 98 F (36.7 C) (05/16 0602) Pulse Rate:  [100-118] 109 (05/16 1005) Resp:  [16] 16 (05/16 0602) SpO2:  [95 %-96 %] 96 % (05/16 0602) Weight:  [73.165 kg (161 lb 4.8 oz)] 73.165 kg (161 lb 4.8 oz) (05/16 0725) Last BM Date: 11/20/15 Mean arterial Pressure 80-90  Intake/Output:   Intake/Output Summary (Last 24 hours) at 11/29/15 1240 Last data filed at 11/29/15 1000  Gross per 24 hour  Intake    240 ml  Output    550 ml  Net   -310 ml     Physical Exam: General:  Well appearing. No resp difficulty HEENT: normal Neck: supple. JVP . Carotids 2+ bilat; no bruits. No lymphadenopathy or thryomegaly appreciated. Cor: normal regular heart sounds with LVAD hum present. Lungs: clear Incisions healing well Abdomen: soft, nontender, nondistended. No hepatosplenomegaly. No bruits or masses. Good bowel sounds. Extremities: no cyanosis, clubbing, rash, edema Neuro: alert & orientedx3, cranial nerves grossly intact. moves all 4 extremities w/o difficulty. Affect pleasant  Telemetry: sinus 90's  Labs: Basic Metabolic Panel:  Recent Labs Lab 11/23/15 0355 11/24/15 0409  11/25/15 0402 11/26/15 0445 11/26/15 0815 11/27/15 0510 11/28/15 0536 11/29/15 0447  NA 136 134*  < > 134*  --  134* 133* 132* 134*  K 4.4 4.6  < > 4.2  --  4.0 4.2 4.3 4.3  CL 106 102  < > 98*  --  96* 96* 96* 95*  CO2 21* 24  --  27  --  28 27 28 27   GLUCOSE 104* 105*  < > 101*  --  115* 111* 114* 118*  BUN 10 8  < > 10  --  10 7 9 10   CREATININE 1.10 1.14  < > 1.04  --  1.05 0.99 0.99 0.85  CALCIUM 8.8* 9.1  --   9.2  --  8.9 8.6* 8.4* 8.8*  MG 1.7 1.9  --  1.6* 1.5*  --  1.8 1.9  --   PHOS 5.1* 4.6  --  4.1  --   --   --   --   --   < > = values in this interval not displayed.  Liver Function Tests:  Recent Labs Lab 11/25/15 0402 11/26/15 0815 11/27/15 0510 11/28/15 0536 11/29/15 0447  AST 32 34 31 35 52*  ALT 21 25 25 27  35  ALKPHOS 70 82 96 133* 192*  BILITOT 10.0* 5.3* 3.5* 2.8* 2.8*  PROT 6.3* 6.5 6.7 6.7 6.5  ALBUMIN 2.3* 2.3* 2.2* 2.3* 2.2*   No results for input(s): LIPASE, AMYLASE in the last 168 hours. No results for input(s): AMMONIA in the last 168 hours.  CBC:  Recent Labs Lab 11/24/15 0409  11/25/15 0402 11/26/15 0445 11/27/15 0510 11/28/15 0536 11/29/15 0447  WBC 9.4  --  10.1 9.0 8.0 9.1 8.0  NEUTROABS 7.1  --  7.5 6.3 5.1 5.4  --   HGB 8.8*  < > 9.0* 9.5* 9.5* 10.3* 9.3*  HCT 26.3*  < > 28.3*  29.2* 27.8* 30.7* 28.6*  MCV 101.5*  --  99.3 98.3 98.9 99.0 98.6  PLT 165  --  207 249 284 359 381  < > = values in this interval not displayed.  INR:  Recent Labs Lab 11/25/15 0402 11/26/15 0445 11/27/15 0510 11/28/15 0536 11/29/15 0447  INR 1.59* 2.34* 2.46* 2.43* 2.21*   LDH 235  Other results:  EKG:   Imaging: Dg Chest 2 View  11/27/2015  CLINICAL DATA:  LVAD in place EXAM: CHEST  2 VIEW COMPARISON:  Film from earlier in the same day FINDINGS: Left ventricular assist device is again identified and stable. A right PICC line is noted in satisfactory position. A left-sided pleural effusion is better visualized on the current exam. Apical pneumothorax is not well appreciated although the air-fluid level would suggest some degree of hydro pneumothorax. No acute bony abnormality is noted. IMPRESSION: Hydro pneumothorax on the left. The apical component of the pneumothorax is not well appreciated on this exam. These results will be called to the ordering clinician or representative by the Radiologist Assistant, and communication documented in the PACS or  zVision Dashboard. Electronically Signed   By: Alcide Clever M.D.   On: 11/27/2015 17:23      Medications:     Scheduled Medications: . aspirin  81 mg Oral Daily  . bisacodyl  10 mg Oral Daily   Or  . bisacodyl  10 mg Rectal Daily  . digoxin  0.125 mg Oral Daily  . docusate sodium  200 mg Oral Daily  . feeding supplement (ENSURE ENLIVE)  237 mL Oral BID BM  . fentaNYL  75 mcg Transdermal Q72H  . furosemide  40 mg Oral Daily  . lisinopril  5 mg Oral Daily  . pantoprazole  40 mg Oral Daily  . potassium chloride  20 mEq Oral Daily  . sodium chloride flush  10-40 mL Intracatheter Q12H  . sodium chloride flush  3 mL Intravenous Q12H  . warfarin  2.5 mg Oral q1800  . Warfarin - Pharmacist Dosing Inpatient   Does not apply q1800     Infusions: . sodium chloride Stopped (11/22/15 1500)  . sodium chloride 10 mL/hr at 11/26/15 0700     PRN Medications:  hydrALAZINE, ondansetron (ZOFRAN) IV, oxyCODONE, sodium chloride flush, sodium chloride flush, traMADol   Assessment:   POD 8 S/P HeartMate II LVAD  1. Non-ischemic cardiomyopathy with acute on chronic systolic CHF and cardiogenic shock preop. He had a stable night and levophed weaned. 2. Expected postop acute blood loss anemia 3. Small chronic laminated basilar anterior wall LV thrombus not removed   Plan/Discussion:    He remains hemodynamically stable with Co-ox 81%. Plan Ramp echo today.  Therapeutic on coumadin.  Remove pacing wires today.   Continue teaching and mobilization. His mother apparently can't come down until this weekend.  I reviewed the LVAD parameters from today, and compared the results to the patient's prior recorded data.  No programming changes were made.  The LVAD is functioning within specified parameters.  The patient performs LVAD self-test daily.  LVAD interrogation was negative for any significant power changes, alarms or PI events/speed drops.  LVAD equipment check completed and is in good  working order.  Back-up equipment present.   LVAD education done on emergency procedures and precautions and reviewed exit site care.  Length of Stay: [address]  Alleen Borne 11/29/2015, 12:40 PM

## 2015-11-29 NOTE — Progress Notes (Signed)
CARDIAC REHAB PHASE I   PRE:  Rate/Rhythm: 102 ST    BP: sitting 80    SaO2: 95 RA  MODE:  Ambulation: 1580 ft   POST:  Rate/Rhythm: 121 ST    BP: sitting 76     SaO2: 96-97 RA after 5 min  Tolerated well with quick pace. Rest x2. At times he straightens his back out. No major c/o. To recliner. Pt applied his own lotion on his legs. [phone]  Harriet Masson CES, ACSM 11/29/2015 2:54 PM

## 2015-11-29 NOTE — Progress Notes (Signed)
Patient ID: Matthew Vaughan, male   DOB: 13-Sep-1955, 60 y.o.   MRN: [id number] HeartMate 2 Rounding Note  Subjective:    POD 5 S/P HMII LVAD.  Extubated 5/9 and swan removed. Chest tubes all out.   Milrinone off.   On warfarin per pharmacy. INR 2.21.  LDH stable at 235.  Bilirubin stable to improved.  Walking 1000 ft yesterday. No breakthrough pain, but needing pain meds as scheduled. MAPS 80-90s Also remains in sinus tach around 100, up into 110s as pain meds wane.   LVAD INTERROGATION:  HeartMate II LVAD:  Flow 4.6 liters/min, speed 9000, power 4.7, PI 6.2.  No PI events .   Objective:    Vital Signs:   Temp:  [96 F (35.6 C)-98.6 F (37 C)] 98 F (36.7 C) (05/16 0602) Pulse Rate:  [100-118] 100 (05/16 0602) Resp:  [16] 16 (05/16 0602) SpO2:  [95 %-96 %] 96 % (05/16 0602) Weight:  [161 lb 4.8 oz (73.165 kg)] 161 lb 4.8 oz (73.165 kg) (05/16 0725) Last BM Date: 11/20/15 Mean arterial Pressure 80-90s   Intake/Output:   Intake/Output Summary (Last 24 hours) at 11/29/15 0749 Last data filed at 11/28/15 1900  Gross per 24 hour  Intake    240 ml  Output   1000 ml  Net   -760 ml     Physical Exam: General:  Intubated.  HEENT: Normal Neck: supple. JVP not elevated. Carotids 2+ bilat; no bruits. No thyromegaly or nodule noted.  Cor: Mechanical heart sounds with LVAD hum present. Sternal dressing intact Lungs: CTAB, normal effort Abdomen: soft, NT, ND, no HSM. No bruits or masses. +BS  Driveline: C/D/I; securement device intact and driveline incorporated Extremities: no cyanosis, clubbing, rash, edema Neuro: Alert and oriented x3 .   Telemetry: Sinus tachy 100s-110s  Labs: Basic Metabolic Panel:  Recent Labs Lab 11/23/15 0355 11/24/15 0409  11/25/15 0402 11/26/15 0445 11/26/15 0815 11/27/15 0510 11/28/15 0536 11/29/15 0447  NA 136 134*  < > 134*  --  134* 133* 132* 134*  K 4.4 4.6  < > 4.2  --  4.0 4.2 4.3 4.3  CL 106 102  < > 98*  --  96* 96* 96* 95*  CO2  21* 24  --  27  --  GLUCOSE 104* 105*  < > 101*  --  115* 111* 114* 118*  BUN 10 8  < > 10  --  CREATININE 1.10 1.14  < > 1.04  --  1.05 0.99 0.99 0.85  CALCIUM 8.8* 9.1  --  9.2  --  8.9 8.6* 8.4* 8.8*  MG 1.7 1.9  --  1.6* 1.5*  --  1.8 1.9  --   PHOS 5.1* 4.6  --  4.1  --   --   --   --   --   < > = values in this interval not displayed.  Liver Function Tests:  Recent Labs Lab 11/25/15 0402 11/26/15 0815 11/27/15 0510 11/28/15 0536 11/29/15 0447  AST 32 34 31 35 52*  ALT 35  ALKPHOS 70 82 96 133* 192*  BILITOT 10.0* 5.3* 3.5* 2.8* 2.8*  PROT 6.3* 6.5 6.7 6.7 6.5  ALBUMIN 2.3* 2.3* 2.2* 2.3* 2.2*   No results for input(s): LIPASE, AMYLASE in the last 168 hours. No results for input(s): AMMONIA in the last 168 hours.  CBC:  Recent Labs Lab 11/24/15 0409  11/25/15 0402  11/26/15 0445 11/27/15 0510 11/28/15 0536 11/29/15 0447  WBC 9.4  --  10.1 9.0 8.0 9.1 8.0  NEUTROABS 7.1  --  7.5 6.3 5.1 5.4  --   HGB 8.8*  < > 9.0* 9.5* 9.5* 10.3* 9.3*  HCT 26.3*  < > 28.3* 29.2* 27.8* 30.7* 28.6*  MCV 101.5*  --  99.3 98.3 98.9 99.0 98.6  PLT 165  --  207 249 284 359 381  < > = values in this interval not displayed.  INR:  Recent Labs Lab 11/25/15 0402 11/26/15 0445 11/27/15 0510 11/28/15 0536 11/29/15 0447  INR 1.59* 2.34* 2.46* 2.43* 2.21*    Other results:  EKG:   Imaging: Dg Chest 2 View  11/27/2015  CLINICAL DATA:  LVAD in place EXAM: CHEST  2 VIEW COMPARISON:  Film from earlier in the same day FINDINGS: Left ventricular assist device is again identified and stable. A right PICC line is noted in satisfactory position. A left-sided pleural effusion is better visualized on the current exam. Apical pneumothorax is not well appreciated although the air-fluid level would suggest some degree of hydro pneumothorax. No acute bony abnormality is noted. IMPRESSION: Hydro pneumothorax on the left. The apical component of the  pneumothorax is not well appreciated on this exam. These results will be called to the ordering clinician or representative by the Radiologist Assistant, and communication documented in the PACS or zVision Dashboard. Electronically Signed   By: Alcide Clever M.D.   On: 11/27/2015 17:23   Dg Chest Port 1 View  11/27/2015  CLINICAL DATA:  Left ventricular assist device EXAM: PORTABLE CHEST 1 VIEW COMPARISON:  11/26/2015 FINDINGS: Cardiomediastinal size a cardiomegaly again noted. Left ventricular assist device is unchanged in position. Left chest tube has been removed. Right arm PICC line is stable in position. Stable tiny left apical pneumothorax. Status post median sternotomy. IMPRESSION: Left ventricular assist device is unchanged in position. Left chest tube has been removed. Cardiomegaly. Status post median sternotomy. Stable tiny left apical pneumothorax. Electronically Signed   By: Natasha Mead M.D.   On: 11/27/2015 13:02     Medications:     Scheduled Medications: . aspirin  81 mg Oral Daily  . bisacodyl  10 mg Oral Daily   Or  . bisacodyl  10 mg Rectal Daily  . digoxin  0.125 mg Oral Daily  . docusate sodium  200 mg Oral Daily  . feeding supplement (ENSURE ENLIVE)  237 mL Oral BID BM  . fentaNYL  75 mcg Transdermal Q72H  . furosemide  40 mg Oral Daily  . lisinopril  5 mg Oral Daily  . pantoprazole  40 mg Oral Daily  . potassium chloride  20 mEq Oral Daily  . sodium chloride flush  10-40 mL Intracatheter Q12H  . sodium chloride flush  3 mL Intravenous Q12H  . warfarin  2.5 mg Oral q1800  . Warfarin - Pharmacist Dosing Inpatient   Does not apply q1800    Infusions: . sodium chloride Stopped (11/22/15 1500)  . sodium chloride 10 mL/hr at 11/26/15 0700    PRN Medications: hydrALAZINE, ondansetron (ZOFRAN) IV, oxyCODONE, sodium chloride flush, sodium chloride flush, traMADol   Assessment/Plan/Discussion   1. S/P HMII LVAD POD #5: Stable. He is now off norepinephrine and  milrinone.  MAP 80s-90.  HR now around 100, ST (goes up when in more pain). VAD parameters stable at 9000 rpm.  - INR at goal (2-2.5).  ASA 81.  - Ramp echo likely today. -  BP stable on lisinopril 5 daily.  2. Acute/chronic systolic CHF:  Cardiogenic shock. Nonischemic cardiomyopathy.  - Volume status stable on exam. Continue po lasix.   3. LV thrombus: Small thrombus on MRI.  On Warfarin, goal INR 2-2.5.  4. Anemia: Expected blood loss from surgery. Hemoglobin down slightly again today. Continue to follow.  5. Rhythm: He appears to be in sinus tachycardia, HR 100s-110s.  Rises with pocket pain. 6. Hyperbilirubinemia: Total bilirubin 9-10 range post-op, now trending down (stable 2.8 today).  Suspect this is related to CHF.  LDH is ok and no evidence for pump thrombus.  7. Disposition: Ongoing LVAD teaching, will be ready for home soon. Family likely here by end of week/over weekend, awaiting further.   I reviewed the LVAD parameters from today, and compared the results to the patient's prior recorded data.  No programming changes were made.  The LVAD is functioning within specified parameters.  The patient performs LVAD self-test daily.  LVAD interrogation was negative for any significant power changes, alarms or PI events/speed drops.  LVAD equipment check completed and is in good working order.  Back-up equipment present.   LVAD education done on emergency procedures and precautions and reviewed exit site care.  Length of Stay: [address]  [employer] 11/29/2015, 7:49 AM  VAD Team --- VAD ISSUES ONLY--- Pager [phone] (7am - 7am)  Advanced Heart Failure Team  Pager [phone] (M-F; 7a - 4p)  Please contact [facility] for night-coverage after hours (4p -7a ) and weekends on [email or web address]  Patient seen with PA, agree with the above note.  He is stable, doing well.  We will defer ramp echo until tomorrow given scheduling.  Will be ready for discharge soon.  Will need all family  support in place and mother coming down this weekend.   [name] 11/29/2015

## 2015-11-29 NOTE — Progress Notes (Signed)
ANTICOAGULATION CONSULT NOTE - Follow Up Consult  Pharmacy Consult for Warfarin Indication: LV thrombus / LVAD  No Known Allergies  Patient Measurements: Height: 5\' 11"  (180.3 cm) Weight: 161 lb 4.8 oz (73.165 kg) IBW/kg (Calculated) : 75.3  Vital Signs: Temp: 98 F (36.7 C) (05/16 0602) Temp Source: Oral (05/16 0602) Pulse Rate: 109 (05/16 1005)  Labs:  Recent Labs  11/27/15 0510 11/28/15 0536 11/29/15 0447  HGB 9.5* 10.3* 9.3*  HCT 27.8* 30.7* 28.6*  PLT 284 359 381  LABPROT 26.4* 26.1* 24.4*  INR 2.46* 2.43* 2.21*  CREATININE 0.99 0.99 0.85    Estimated Creatinine Clearance: 98.1 mL/min (by C-G formula based on Cr of 0.85).  Assessment: 60 yo M on warfarin PTA for hx LV thrombus, transitioned to IV heparin on admission pending cath. S/P cath 4/24, found to have NICM, resumed heparin as bridge to LVAD-S/P LVAD on 5/8.  Warfarin resumed 5/9 after discussion with MD.  INR currently 2.21. ASA 81mg . H/H slight drop 10/30> 9/28 watch PLTC stable 300s- no bleeding or complications noted.  Goal of Therapy:  INR 2-2.5 Monitor platelets by anticoagulation protocol: Yes   Plan:  Coumadin 2.5 mg daily - may need a boost every couple days Daily INR  Leota Sauers Pharm.D. CPP, BCPS Clinical Pharmacist [phone] 11/29/2015 10:56 AM

## 2015-11-29 NOTE — Progress Notes (Signed)
Pt driveline dressing changed using sterile technique. Small amount of drainage noted; no sign of infection noted. Pt resting comfortably in bed with call bell within reach. RN will continue to monitor.  Roselie Awkward, RN

## 2015-11-30 ENCOUNTER — Inpatient Hospital Stay (HOSPITAL_COMMUNITY): Payer: Medicaid Other

## 2015-11-30 DIAGNOSIS — I509 Heart failure, unspecified: Secondary | ICD-10-CM

## 2015-11-30 DIAGNOSIS — Z95811 Presence of heart assist device: Secondary | ICD-10-CM

## 2015-11-30 LAB — CBC
HEMATOCRIT: 30.1 % — AB (ref 39.0–52.0)
HEMOGLOBIN: 10.1 g/dL — AB (ref 13.0–17.0)
MCH: 33.3 pg (ref 26.0–34.0)
MCHC: 33.6 g/dL (ref 30.0–36.0)
MCV: 99.3 fL (ref 78.0–100.0)
PLATELETS: 457 10*3/uL — AB (ref 150–400)
RBC: 3.03 MIL/uL — AB (ref 4.22–5.81)
RDW: 14.6 % (ref 11.5–15.5)
WBC: 10.4 10*3/uL (ref 4.0–10.5)

## 2015-11-30 LAB — PROTIME-INR
INR: 2.07 — ABNORMAL HIGH (ref 0.00–1.49)
Prothrombin Time: 23.2 seconds — ABNORMAL HIGH (ref 11.6–15.2)

## 2015-11-30 LAB — BASIC METABOLIC PANEL
ANION GAP: 11 (ref 5–15)
BUN: 9 mg/dL (ref 6–20)
CHLORIDE: 93 mmol/L — AB (ref 101–111)
CO2: 29 mmol/L (ref 22–32)
CREATININE: 1.02 mg/dL (ref 0.61–1.24)
Calcium: 9.3 mg/dL (ref 8.9–10.3)
GFR calc non Af Amer: >60 mL/min (ref >60)
Glucose, Bld: 104 mg/dL — ABNORMAL HIGH (ref 65–99)
Potassium: 5.2 mmol/L — ABNORMAL HIGH (ref 3.5–5.1)
Sodium: 133 mmol/L — ABNORMAL LOW (ref 135–145)

## 2015-11-30 LAB — ECHOCARDIOGRAM LIMITED
Height: 71 in
Weight: 2531.2 oz

## 2015-11-30 LAB — LACTATE DEHYDROGENASE: LDH: 233 U/L — AB (ref 98–192)

## 2015-11-30 MED ORDER — WARFARIN SODIUM 5 MG PO TABS
5.0000 mg | ORAL_TABLET | Freq: Once | ORAL | Status: AC
  Administered 2015-11-30: 5 mg via ORAL
  Filled 2015-11-30: qty 1

## 2015-11-30 NOTE — Progress Notes (Signed)
EPW removed per order and per protocol. Tips intact. Pt tolerated well. VSS. R site noted to be bleeding, gauze applied. Doppler MAPs 78 pre and post EPW removal. Pt educated on need for bedrest for 1 hour. Call bell and phone within reach. Will continue to monitor.

## 2015-11-30 NOTE — Progress Notes (Signed)
CARDIAC REHAB PHASE I   PRE:  Rate/Rhythm: 104 ST    BP: sitting 80    SaO2: 96 RA  MODE:  Ambulation: 440 ft   POST:  Rate/Rhythm: 122 ST    BP: sitting 78      SaO2: 96 RA  Pt donned his batteries independently, moving in room independently. Used rollator independently. Took elevator and came outside to [facility] entrance. Pt sat outside for 10 min. Tolerated well, VSS. Will continue to follow. [id number]   Harriet Masson CES, ACSM 11/30/2015 12:06 PM

## 2015-11-30 NOTE — Progress Notes (Signed)
Speed  Flow  PI  Power  LVIDD  AI  Aortic openings  MR  TR  Septum  RV   9000  7.2 5.1 6.5 6.34 trivial 0/5 trace trivial Bowing to right Mod HK    9200  5.4 4.9 5.6 5.59 trivial 0/5 trace trace Bowing to right   9400  6.6 4.0 7.2 5.6 trivial 0/5 Trace trace Bowing to right    9600 5.5 3.4 6.7 4.95 none 5/5 none mild Bowing slightly right Mod-sev hypo                             Doppler MAP: 88  Ramp ECHO performed at bedside per Dr. Shirlee Latch. At completion of ramp study, patients primary and back up controller programmed:  Fixed speed:9600 Low speed limit:9000

## 2015-11-30 NOTE — Progress Notes (Signed)
Patient ID: Matthew Vaughan, male   DOB: 1956/07/12, 60 y.o.   MRN: [id number] HeartMate 2 Rounding Note  Subjective:    POD 5 S/P HMII LVAD.  Extubated 5/9 and swan removed. Chest tubes all out.   Milrinone off.   On warfarin per pharmacy. INR 2.07.  LDH stable at 233.  Bilirubin stable. Hgb stable.  Feels OK this morning.  Palpating his chest he feels like his heart is "thumping away."  No palpitations, CP, or SOB. MAPS 80s HR in 90-100s. Epicardial wires remain in. Pt continually deferred removal yesterday.  Also deferred dressing change until late in evening.   LVAD INTERROGATION:  HeartMate II LVAD:  Flow 4.6 liters/min, speed 9000, power 4.7, PI 6.2.  No PI events .   Objective:    Vital Signs:   Temp:  [97 F (36.1 C)-98.6 F (37 C)] 98.6 F (37 C) (05/17 0437) Pulse Rate:  [98-109] 98 (05/17 0437) Resp:  [18] 18 (05/17 0437) SpO2:  [95 %-99 %] 99 % (05/17 0437) Weight:  [158 lb 3.2 oz (71.759 kg)-161 lb 4.8 oz (73.165 kg)] 158 lb 3.2 oz (71.759 kg) (05/17 0658) Last BM Date: 11/20/15 Mean arterial Pressure 80-90s   Intake/Output:   Intake/Output Summary (Last 24 hours) at 11/30/15 0712 Last data filed at 11/30/15 0658  Gross per 24 hour  Intake    720 ml  Output   1350 ml  Net   -630 ml     Physical Exam: General:  Intubated.  HEENT: Normal Neck: supple. JVP not elevated. Carotids 2+ bilat; no bruits. No thyromegaly or nodule noted.  Cor: Mechanical heart sounds with LVAD hum present. Sternal dressing intact. ? "Click" heard best at apex but also radiates to aortic area. ?gallop Lungs: Clear Abdomen: soft, non-tender, non-distended, no HSM. No bruits or masses. +BS  Driveline: C/D/I; securement device intact and driveline incorporated Extremities: no cyanosis, clubbing, rash, edema Neuro: Alert and oriented x3 .   Telemetry: NSRS/Sinus Tach 90-100s  Labs: Basic Metabolic Panel:  Recent Labs Lab 11/24/15 0409  11/25/15 0402 11/26/15 0445 11/26/15 0815  11/27/15 0510 11/28/15 0536 11/29/15 0447  NA 134*  < > 134*  --  134* 133* 132* 134*  K 4.6  < > 4.2  --  4.0 4.2 4.3 4.3  CL 102  < > 98*  --  96* 96* 96* 95*  CO2 24  --  27  --  28 27 28 27   GLUCOSE 105*  < > 101*  --  115* 111* 114* 118*  BUN 8  < > 10  --  10 7 9 10   CREATININE 1.14  < > 1.04  --  1.05 0.99 0.99 0.85  CALCIUM 9.1  --  9.2  --  8.9 8.6* 8.4* 8.8*  MG 1.9  --  1.6* 1.5*  --  1.8 1.9  --   PHOS 4.6  --  4.1  --   --   --   --   --   < > = values in this interval not displayed.  Liver Function Tests:  Recent Labs Lab 11/25/15 0402 11/26/15 0815 11/27/15 0510 11/28/15 0536 11/29/15 0447  AST 32 34 31 35 52*  ALT 21 25 25 27  35  ALKPHOS 70 82 96 133* 192*  BILITOT 10.0* 5.3* 3.5* 2.8* 2.8*  PROT 6.3* 6.5 6.7 6.7 6.5  ALBUMIN 2.3* 2.3* 2.2* 2.3* 2.2*   No results for input(s): LIPASE, AMYLASE in the last 168 hours. No  results for input(s): AMMONIA in the last 168 hours.  CBC:  Recent Labs Lab 11/24/15 0409  11/25/15 0402 11/26/15 0445 11/27/15 0510 11/28/15 0536 11/29/15 0447  WBC 9.4  --  10.1 9.0 8.0 9.1 8.0  NEUTROABS 7.1  --  7.5 6.3 5.1 5.4  --   HGB 8.8*  < > 9.0* 9.5* 9.5* 10.3* 9.3*  HCT 26.3*  < > 28.3* 29.2* 27.8* 30.7* 28.6*  MCV 101.5*  --  99.3 98.3 98.9 99.0 98.6  PLT 165  --  207 249 284 359 381  < > = values in this interval not displayed.  INR:  Recent Labs Lab 11/26/15 0445 11/27/15 0510 11/28/15 0536 11/29/15 0447 11/30/15 0522  INR 2.34* 2.46* 2.43* 2.21* 2.07*    Other results:  EKG:   Imaging: No results found.   Medications:     Scheduled Medications: . aspirin  81 mg Oral Daily  . bisacodyl  10 mg Oral Daily   Or  . bisacodyl  10 mg Rectal Daily  . digoxin  0.125 mg Oral Daily  . docusate sodium  200 mg Oral Daily  . feeding supplement (ENSURE ENLIVE)  237 mL Oral BID BM  . fentaNYL  75 mcg Transdermal Q72H  . furosemide  40 mg Oral Daily  . lisinopril  5 mg Oral Daily  . pantoprazole  40 mg  Oral Daily  . potassium chloride  20 mEq Oral Daily  . sodium chloride flush  10-40 mL Intracatheter Q12H  . sodium chloride flush  3 mL Intravenous Q12H  . warfarin  2.5 mg Oral q1800  . Warfarin - Pharmacist Dosing Inpatient   Does not apply q1800    Infusions: . sodium chloride Stopped (11/22/15 1500)  . sodium chloride 10 mL/hr at 11/26/15 0700    PRN Medications: hydrALAZINE, ondansetron (ZOFRAN) IV, oxyCODONE, sodium chloride flush, sodium chloride flush, traMADol   Assessment/Plan/Discussion   1. S/P HMII LVAD POD #5: Stable. He is now off norepinephrine and milrinone.  MAP 80s-90.  HR now around 100, ST (goes up when in more pain). VAD parameters stable at 9000 rpm.  - INR at goal (2-2.5).  ASA 81.   Will likely need a boost today, has been trending down over several days.  - Ramp echo moved to today pending scheduling.  - BP stable on lisinopril 5 daily.  2. Acute/chronic systolic CHF:  Cardiogenic shock. Nonischemic cardiomyopathy.  - Volume status stable on exam. Continue po lasix.   3. LV thrombus: Small thrombus on MRI.  On Warfarin, goal INR 2-2.5.  4. Anemia: Expected blood loss from surgery. Hemoglobin stable today. Continue to follow.  5. Rhythm: He appears to be in sinus tachycardia, HR 100s-110s.  Rises with pocket pain. 6. Hyperbilirubinemia: Total bilirubin 9-10 range post-op, now trending down (stable 2.8 today).  Suspect this is related to CHF.  LDH is ok and no evidence for pump thrombus.  7. Disposition: Ongoing LVAD teaching. Likely home late this week or early next pending family arrival and training.  Discussed the importance of timely removal of epicardial wires to him and encouraged to comply more readily with direction from nurses.    I reviewed the LVAD parameters from today, and compared the results to the patient's prior recorded data.  No programming changes were made.  The LVAD is functioning within specified parameters.  The patient performs LVAD  self-test daily.  LVAD interrogation was negative for any significant power changes, alarms or PI events/speed drops.  LVAD equipment check completed and is in good working order.  Back-up equipment present.   LVAD education done on emergency procedures and precautions and reviewed exit site care.  Length of Stay: [address]  [employer] 11/30/2015, 7:12 AM  VAD Team --- VAD ISSUES ONLY--- Pager [phone] (7am - 7am)  Advanced Heart Failure Team  Pager [phone] (M-F; 7a - 4p)  Please contact [facility] for night-coverage after hours (4p -7a ) and weekends on [email or web address]  Patient seen with PA, agree with the above note.  There is a metallic click heard this morning on auscultation at the apex.  LDH unchanged. No alarms on LVAD interrogation.  This sound is new.  I will get a CXR PA/lateral for device position.  He will be getting a ramp echo today as well.   [name] 11/30/2015 8:47 AM

## 2015-11-30 NOTE — Progress Notes (Signed)
CSW met at bedside with patient and his brother. Patient and brother asked for assistance with getting medical records from [facility] to Disability Determination as it is the only piece left for disability determination. CSW obtained release of information from [facility] and patient completed with signature and faxed to [facility] Records requesting sent to [facility]. Patient and brother grateful for assistance. Patient also spoke of progress made with Cardiac Rehab and feeling stronger and more confident with managing the LVAD. Patient appears to be improving and getting ready for life with his new LVAD. CSW will continue to follow for support and encouragement throughout recovery. Jackie Brennan, LCSW [phone]   

## 2015-11-30 NOTE — Progress Notes (Addendum)
Patient ID: Matthew Vaughan, male   DOB: 23-Apr-1956, 60 y.o.   MRN: [id number] HeartMate 2 Rounding Note  Subjective:    He feels well. Ambulating without shortness of breath  Had Ramp echo and speed turned up to 9600   LVAD INTERROGATION:  HeartMate II LVAD:  Flow 4.5 liters/min, speed 9600, power 6, PI 3.4.    Objective:    Vital Signs:   Temp:  [98 F (36.7 C)-98.6 F (37 C)] 98.6 F (37 C) (05/17 0437) Pulse Rate:  [98-111] 111 (05/17 0942) Resp:  [18] 18 (05/17 0437) SpO2:  [95 %-99 %] 99 % (05/17 0437) Weight:  [71.759 kg (158 lb 3.2 oz)] 71.759 kg (158 lb 3.2 oz) (05/17 0658) Last BM Date: 11/20/15 Mean arterial Pressure 78  Intake/Output:   Intake/Output Summary (Last 24 hours) at 11/30/15 1902 Last data filed at 11/30/15 1700  Gross per 24 hour  Intake    600 ml  Output    300 ml  Net    300 ml     Physical Exam: General:  Well appearing. No resp difficulty HEENT: normal Cor: Normal heart sounds with LVAD hum present. Lungs: clear Incisions healing well. Pacing wires out. Abdomen: soft, nontender, nondistended. No hepatosplenomegaly. No bruits or masses. Good bowel sounds. Extremities: no cyanosis, clubbing, rash, edema Neuro: alert & orientedx3, cranial nerves grossly intact. moves all 4 extremities w/o difficulty. Affect pleasant  Telemetry: sinus 100  Labs: Basic Metabolic Panel:  Recent Labs Lab 11/24/15 0409  11/25/15 0402 11/26/15 0445 11/26/15 0815 11/27/15 0510 11/28/15 0536 11/29/15 0447 11/30/15 1115  NA 134*  < > 134*  --  134* 133* 132* 134* 133*  K 4.6  < > 4.2  --  4.0 4.2 4.3 4.3 5.2*  CL 102  < > 98*  --  96* 96* 96* 95* 93*  CO2 24  --  27  --  28 27 28 27 29   GLUCOSE 105*  < > 101*  --  115* 111* 114* 118* 104*  BUN 8  < > 10  --  10 7 9 10 9   CREATININE 1.14  < > 1.04  --  1.05 0.99 0.99 0.85 1.02  CALCIUM 9.1  --  9.2  --  8.9 8.6* 8.4* 8.8* 9.3  MG 1.9  --  1.6* 1.5*  --  1.8 1.9  --   --   PHOS 4.6  --  4.1  --   --    --   --   --   --   < > = values in this interval not displayed.  Liver Function Tests:  Recent Labs Lab 11/25/15 0402 11/26/15 0815 11/27/15 0510 11/28/15 0536 11/29/15 0447  AST 32 34 31 35 52*  ALT 21 25 25 27  35  ALKPHOS 70 82 96 133* 192*  BILITOT 10.0* 5.3* 3.5* 2.8* 2.8*  PROT 6.3* 6.5 6.7 6.7 6.5  ALBUMIN 2.3* 2.3* 2.2* 2.3* 2.2*   No results for input(s): LIPASE, AMYLASE in the last 168 hours. No results for input(s): AMMONIA in the last 168 hours.  CBC:  Recent Labs Lab 11/24/15 0409  11/25/15 0402 11/26/15 0445 11/27/15 0510 11/28/15 0536 11/29/15 0447 11/30/15 1115  WBC 9.4  --  10.1 9.0 8.0 9.1 8.0 10.4  NEUTROABS 7.1  --  7.5 6.3 5.1 5.4  --   --   HGB 8.8*  < > 9.0* 9.5* 9.5* 10.3* 9.3* 10.1*  HCT 26.3*  < > 28.3* 29.2* 27.8*  30.7* 28.6* 30.1*  MCV 101.5*  --  99.3 98.3 98.9 99.0 98.6 99.3  PLT 165  --  207 249 284 359 381 457*  < > = values in this interval not displayed.  INR:  Recent Labs Lab 11/26/15 0445 11/27/15 0510 11/28/15 0536 11/29/15 0447 11/30/15 0522  INR 2.34* 2.46* 2.43* 2.21* 2.07*   LDH 233  Other results:  EKG:   Imaging: Dg Chest 2 View  11/30/2015  CLINICAL DATA:  Followup LVAD EXAM: CHEST  2 VIEW COMPARISON:  11/27/2015 FINDINGS: Cardiac shadow remains enlarged. A left ventricular assist device is again seen. Small hydro pneumothorax is again noted on the left stable from the prior exam. In a apical component to the pneumothorax is not well seen. Right-sided PICC line is again noted in the proximal superior vena cava. Some minimal changes are seen in the right upper lobe consistent with regressing infiltrate from the right upper lobe. IMPRESSION: Overall appearance is stable from the previous exam. Electronically Signed   By: Alcide Clever M.D.   On: 11/30/2015 08:26      Medications:     Scheduled Medications: . aspirin  81 mg Oral Daily  . bisacodyl  10 mg Oral Daily   Or  . bisacodyl  10 mg Rectal Daily  .  digoxin  0.125 mg Oral Daily  . docusate sodium  200 mg Oral Daily  . feeding supplement (ENSURE ENLIVE)  237 mL Oral BID BM  . fentaNYL  75 mcg Transdermal Q72H  . furosemide  40 mg Oral Daily  . lisinopril  5 mg Oral Daily  . pantoprazole  40 mg Oral Daily  . potassium chloride  20 mEq Oral Daily  . sodium chloride flush  10-40 mL Intracatheter Q12H  . sodium chloride flush  3 mL Intravenous Q12H  . Warfarin - Pharmacist Dosing Inpatient   Does not apply q1800     Infusions: . sodium chloride Stopped (11/22/15 1500)  . sodium chloride 10 mL/hr at 11/26/15 0700     PRN Medications:  hydrALAZINE, ondansetron (ZOFRAN) IV, oxyCODONE, sodium chloride flush, sodium chloride flush, traMADol   Assessment:   POD 9 S/P HeartMate II LVAD  1. Non-ischemic cardiomyopathy with acute on chronic systolic CHF and cardiogenic shock preop. He had a stable night and levophed weaned. 2. Expected postop acute blood loss anemia 3. Small chronic laminated basilar anterior wall LV thrombus not removed   Plan/Discussion:    He is hemodynamically stable. Speed turned up to 9600 and PI lower. Follow for now.  INR therapeutic but drifting down a little so given a boost of coumadin tonight.   CXR today shows a small loculated left hydropneumothorax. Follow since it appears stable. Cannula and pump position look good.  Continue mobilization , IS, teaching.  Says his mother will be here this weekend.    I reviewed the LVAD parameters from today, and compared the results to the patient's prior recorded data.  No programming changes were made.  The LVAD is functioning within specified parameters.  The patient performs LVAD self-test daily.  LVAD interrogation was negative for any significant power changes, alarms or PI events/speed drops.  LVAD equipment check completed and is in good working order.  Back-up equipment present.   LVAD education done on emergency procedures and precautions and  reviewed exit site care.  Length of Stay: [address]  Alleen Borne 11/30/2015, 7:02 PM

## 2015-11-30 NOTE — Anesthesia Postprocedure Evaluation (Signed)
Anesthesia Post Note  Patient: Matthew Vaughan  Procedure(s) Performed: Procedure(s) (LRB): COLONOSCOPY (N/A) ESOPHAGOGASTRODUODENOSCOPY (EGD) (N/A)  Patient location during evaluation: Endoscopy Anesthesia Type: MAC Level of consciousness: awake, awake and alert and oriented Pain management: pain level controlled Vital Signs Assessment: post-procedure vital signs reviewed and stable Respiratory status: spontaneous breathing, nonlabored ventilation and respiratory function stable Cardiovascular status: blood pressure returned to baseline Anesthetic complications: no    Last Vitals:  Filed Vitals:   11/30/15 0437 11/30/15 0942  BP:    Pulse: 98 111  Temp: 37 C   Resp: 18     Last Pain:  Filed Vitals:   11/30/15 0942  PainSc: 6                  Jyasia Markoff COKER

## 2015-11-30 NOTE — Progress Notes (Signed)
Occupational Therapy Treatment Patient Details Name: Matthew Vaughan MRN: [id number] DOB: 1955/12/23 Today's Date: 11/30/2015    History of present illness 60 y.o. male. Residence is in [place] but was visiting family in [place] when he came to hospital. Hx non-ischemic CM. EF ~ 20% historically. HTN. Chronic Coumadin for hx LV thrombus. S/p cath in 2015. Presents with Non-ischemic cardiomyopathy with acute on chronic systolic CHF and cardiogenic shock. Patient is now s/p LVAD implantation.   OT comments  Pt utilizing energy conservation strategies with LB dressing and when ambulating. Able to state 3 additional techniques. Pt ambulated to bathroom and to sink in room with supervision and no AD. Demonstrated ability to don and doff socks and applied lotion to extremities. Do not anticipate pt will need post acute OT at this point, but will continue to follow.  Follow Up Recommendations  No OT follow up;Supervision/Assistance - 24 hour    Equipment Recommendations  3 in 1 bedside comode    Recommendations for Other Services      Precautions / Restrictions Precautions Precautions: Fall;Sternal Precaution Comments: generalizing sternal precautions Restrictions Other Position/Activity Restrictions: sternal precautions       Mobility Bed Mobility               General bed mobility comments: pt seated EOB  Transfers Overall transfer level: Modified independent Equipment used: None   Sit to Stand: Modified independent (Device/Increase time)         General transfer comment: no assist from bed or recliner, hands on knees    Balance     Sitting balance-Leahy Scale: Good       Standing balance-Leahy Scale: Good                     ADL       Grooming: Supervision/safety;Standing;Wash/dry hands               Lower Body Dressing: Set up;Sitting/lateral leans Lower Body Dressing Details (indicate cue type and reason): donned and doffed socks Toilet  Transfer: Supervision/safety;Ambulation (without walker in room) Toilet Transfer Details (indicate cue type and reason): stood to urinate Toileting- Clothing Manipulation and Hygiene: Supervision/safety;Sit to/from stand       Functional mobility during ADLs: Supervision/safety (did not use device in room to bathroom and to sink) General ADL Comments: Pt able to state 3 energy conservation strategies and applied lotion to arms and legs with set up.      Vision                     Perception     Praxis      Cognition   Behavior During Therapy: WFL for tasks assessed/performed Overall Cognitive Status: Within Functional Limits for tasks assessed                       Extremity/Trunk Assessment               Exercises     Shoulder Instructions       General Comments      Pertinent Vitals/ Pain       Pain Assessment: Faces Faces Pain Scale: Hurts little more Pain Location: abdomen Pain Descriptors / Indicators: Sore Pain Intervention(s): Repositioned;Premedicated before session  Home Living  Prior Functioning/Environment              Frequency Min 2X/week     Progress Toward Goals  OT Goals(current goals can now be found in the care plan section)  Progress towards OT goals: Progressing toward goals  Acute Rehab OT Goals Patient Stated Goal: Go fishing. Time For Goal Achievement: 12/07/15 Potential to Achieve Goals: Good  Plan Discharge plan needs to be updated    Co-evaluation                 End of Session     Activity Tolerance Patient tolerated treatment well   Patient Left in bed;with call bell/phone within reach;with family/visitor present (EOB eating lunch)   Nurse Communication          Time: 0300-9233 OT Time Calculation (min): 15 min  Charges: OT General Charges $OT Visit: 1 Procedure OT Treatments $Self Care/Home Management : 8-22  mins  Evern Bio 11/30/2015, 2:18 PM [phone]

## 2015-11-30 NOTE — Progress Notes (Signed)
Late entry Patient ambulated in hallway x1 assist with back up equipment and four wheel walker,approximately 980 feet,  tolerated well back in bed for dressing change will monitor patient. Kylene Zamarron, Randall An RN

## 2015-11-30 NOTE — Progress Notes (Signed)
ANTICOAGULATION CONSULT NOTE - Follow Up Consult  Pharmacy Consult for Warfarin Indication: LV thrombus / LVAD  No Known Allergies  Patient Measurements: Height: 5\' 11"  (180.3 cm) Weight: 158 lb 3.2 oz (71.759 kg) IBW/kg (Calculated) : 75.3  Vital Signs: Temp: 98.6 F (37 C) (05/17 0437) Temp Source: Oral (05/17 0437) Pulse Rate: 111 (05/17 0942)  Labs:  Recent Labs  11/28/15 0536 11/29/15 0447 11/30/15 0522 11/30/15 1115  HGB 10.3* 9.3*  --  10.1*  HCT 30.7* 28.6*  --  30.1*  PLT 359 381  --  457*  LABPROT 26.1* 24.4* 23.2*  --   INR 2.43* 2.21* 2.07*  --   CREATININE 0.99 0.85  --  1.02    Estimated Creatinine Clearance: 80.2 mL/min (by C-G formula based on Cr of 1.02).  Assessment: 60 yo M on warfarin PTA for hx LV thrombus, transitioned to IV heparin on admission pending cath. S/P cath 4/24, found to have NICM, resumed heparin as bridge to LVAD-S/P LVAD on 5/8.  Warfarin resumed 5/9 after discussion with MD.  INR currently 2.07 - has been trending down on warf 2.5mg  qd - will boost ASA 81mg  qd H/H slight drop 10/30> 9/28 watch PLTC stable 300s- no bleeding or complications noted.  Goal of Therapy:  INR 2-2.5 Monitor platelets by anticoagulation protocol: Yes   Plan:  Coumadin 5 mg x1  Daily INR  Leota Sauers Pharm.D. CPP, BCPS Clinical Pharmacist [phone] 11/30/2015 3:31 PM

## 2015-11-30 NOTE — Progress Notes (Signed)
  Echocardiogram 2D Echocardiogram has been performed.  Janalyn Harder 11/30/2015, 1:35 PM

## 2015-12-01 LAB — CBC
HCT: 30 % — ABNORMAL LOW (ref 39.0–52.0)
Hemoglobin: 9.7 g/dL — ABNORMAL LOW (ref 13.0–17.0)
MCH: 31.6 pg (ref 26.0–34.0)
MCHC: 32.3 g/dL (ref 30.0–36.0)
MCV: 97.7 fL (ref 78.0–100.0)
PLATELETS: 473 10*3/uL — AB (ref 150–400)
RBC: 3.07 MIL/uL — AB (ref 4.22–5.81)
RDW: 14.6 % (ref 11.5–15.5)
WBC: 8.9 10*3/uL (ref 4.0–10.5)

## 2015-12-01 LAB — BASIC METABOLIC PANEL
Anion gap: 10 (ref 5–15)
BUN: 8 mg/dL (ref 6–20)
CALCIUM: 8.9 mg/dL (ref 8.9–10.3)
CHLORIDE: 93 mmol/L — AB (ref 101–111)
CO2: 27 mmol/L (ref 22–32)
CREATININE: 1.04 mg/dL (ref 0.61–1.24)
Glucose, Bld: 128 mg/dL — ABNORMAL HIGH (ref 65–99)
Potassium: 4.2 mmol/L (ref 3.5–5.1)
SODIUM: 130 mmol/L — AB (ref 135–145)

## 2015-12-01 LAB — LACTATE DEHYDROGENASE: LDH: 249 U/L — AB (ref 98–192)

## 2015-12-01 LAB — PROTIME-INR
INR: 2.04 — ABNORMAL HIGH (ref 0.00–1.49)
PROTHROMBIN TIME: 22.9 s — AB (ref 11.6–15.2)

## 2015-12-01 MED ORDER — WARFARIN SODIUM 5 MG PO TABS: 5.0000 mg | ORAL_TABLET | Freq: Once | ORAL | Status: DC

## 2015-12-01 MED ORDER — ONDANSETRON 4 MG PO TBDP
ORAL_TABLET | ORAL | Status: AC
  Administered 2015-12-01: 13:00:00
  Filled 2015-12-01: qty 1

## 2015-12-01 MED ORDER — WARFARIN SODIUM 5 MG PO TABS
5.0000 mg | ORAL_TABLET | ORAL | Status: DC
  Administered 2015-12-02: 5 mg via ORAL
  Filled 2015-12-01: qty 1

## 2015-12-01 MED ORDER — WARFARIN SODIUM 2.5 MG PO TABS
2.5000 mg | ORAL_TABLET | ORAL | Status: DC
  Administered 2015-12-01: 2.5 mg via ORAL
  Filled 2015-12-01: qty 1

## 2015-12-01 NOTE — Progress Notes (Signed)
driveline dressing change completed with Pt brother performing all tasks, assisted by RN .   Sterile technique maintained, requiring only one extra glove change at the beginning.  Brother and Pt expressed gratitude and increasing comfort and confidence with procedure.  Will con't plan of care.

## 2015-12-01 NOTE — Progress Notes (Signed)
Patient ID: Matthew Vaughan, male   DOB: 16-Aug-1955, 60 y.o.   MRN: [id number] HeartMate 2 Rounding Note  Subjective:    No complaints  Ambulating without shortness of breath.  LVAD INTERROGATION:  HeartMate II LVAD:  Flow 5.5 liters/min, speed 9600, power 6, PI 3.9.  No PI events  Objective:    Vital Signs:   Temp:  [97.8 F (36.6 C)-98.3 F (36.8 C)] 97.8 F (36.6 C) (05/18 0512) Pulse Rate:  [101-103] 103 (05/18 0512) Resp:  [18] 18 (05/18 0512) SpO2:  [98 %-99 %] 99 % (05/18 0512) Weight:  [71.1 kg (156 lb 12 oz)] 71.1 kg (156 lb 12 oz) (05/18 0512) Last BM Date: 11/23/15 Mean arterial Pressure 80's  Intake/Output:   Intake/Output Summary (Last 24 hours) at 12/01/15 1343 Last data filed at 12/01/15 0515  Gross per 24 hour  Intake    120 ml  Output    600 ml  Net   -480 ml     Physical Exam: General:  Well appearing. No resp difficulty HEENT: normal Cor: Normal heart sounds with LVAD hum present. Lungs: clear but slight decrease at left base Incisions healing well Abdomen: soft, nontender, nondistended. No hepatosplenomegaly. No bruits or masses. Good bowel sounds. Extremities: no cyanosis, clubbing, rash, edema Neuro: alert & orientedx3, cranial nerves grossly intact. moves all 4 extremities w/o difficulty. Affect pleasant  Telemetry: sinus 98  Labs: Basic Metabolic Panel:  Recent Labs Lab 11/25/15 0402 11/26/15 0445  11/27/15 0510 11/28/15 0536 11/29/15 0447 11/30/15 1115 12/01/15 0540  NA 134*  --   < > 133* 132* 134* 133* 130*  K 4.2  --   < > 4.2 4.3 4.3 5.2* 4.2  CL 98*  --   < > 96* 96* 95* 93* 93*  CO2 27  --   < > GLUCOSE 101*  --   < > 111* 114* 118* 104* 128*  BUN 10  --   < > CREATININE 1.04  --   < > 0.99 0.99 0.85 1.02 1.04  CALCIUM 9.2  --   < > 8.6* 8.4* 8.8* 9.3 8.9  MG 1.6* 1.5*  --  1.8 1.9  --   --   --   PHOS 4.1  --   --   --   --   --   --   --   < > = values in this interval not  displayed.  Liver Function Tests:  Recent Labs Lab 11/25/15 0402 11/26/15 0815 11/27/15 0510 11/28/15 0536 11/29/15 0447  AST 32 34 31 35 52*  ALT 35  ALKPHOS 70 82 96 133* 192*  BILITOT 10.0* 5.3* 3.5* 2.8* 2.8*  PROT 6.3* 6.5 6.7 6.7 6.5  ALBUMIN 2.3* 2.3* 2.2* 2.3* 2.2*   No results for input(s): LIPASE, AMYLASE in the last 168 hours. No results for input(s): AMMONIA in the last 168 hours.  CBC:  Recent Labs Lab 11/25/15 0402 11/26/15 0445 11/27/15 0510 11/28/15 0536 11/29/15 0447 11/30/15 1115 12/01/15 0540  WBC 10.1 9.0 8.0 9.1 8.0 10.4 8.9  NEUTROABS 7.5 6.3 5.1 5.4  --   --   --   HGB 9.0* 9.5* 9.5* 10.3* 9.3* 10.1* 9.7*  HCT 28.3* 29.2* 27.8* 30.7* 28.6* 30.1* 30.0*  MCV 99.3 98.3 98.9 99.0 98.6 99.3 97.7  PLT 207 249 284 359 381 457* 473*    INR:  Recent Labs Lab 11/27/15  0510 11/28/15 0536 11/29/15 0447 11/30/15 0522 12/01/15 0540  INR 2.46* 2.43* 2.21* 2.07* 2.04*   LDH 249  Other results:  EKG:   Imaging: Dg Chest 2 View  11/30/2015  CLINICAL DATA:  Followup LVAD EXAM: CHEST  2 VIEW COMPARISON:  11/27/2015 FINDINGS: Cardiac shadow remains enlarged. A left ventricular assist device is again seen. Small hydro pneumothorax is again noted on the left stable from the prior exam. In a apical component to the pneumothorax is not well seen. Right-sided PICC line is again noted in the proximal superior vena cava. Some minimal changes are seen in the right upper lobe consistent with regressing infiltrate from the right upper lobe. IMPRESSION: Overall appearance is stable from the previous exam. Electronically Signed   By: Alcide Clever M.D.   On: 11/30/2015 08:26      Medications:     Scheduled Medications: . aspirin  81 mg Oral Daily  . bisacodyl  10 mg Oral Daily   Or  . bisacodyl  10 mg Rectal Daily  . digoxin  0.125 mg Oral Daily  . docusate sodium  200 mg Oral Daily  . feeding supplement (ENSURE ENLIVE)  237 mL Oral BID BM   . fentaNYL  75 mcg Transdermal Q72H  . furosemide  40 mg Oral Daily  . lisinopril  5 mg Oral Daily  . ondansetron      . pantoprazole  40 mg Oral Daily  . potassium chloride  20 mEq Oral Daily  . sodium chloride flush  10-40 mL Intracatheter Q12H  . sodium chloride flush  3 mL Intravenous Q12H  . Warfarin - Pharmacist Dosing Inpatient   Does not apply q1800     Infusions: . sodium chloride Stopped (11/22/15 1500)  . sodium chloride 10 mL/hr at 11/26/15 0700     PRN Medications:  hydrALAZINE, ondansetron (ZOFRAN) IV, oxyCODONE, sodium chloride flush, sodium chloride flush, traMADol   Assessment:   POD 10 S/P HeartMate II LVAD  1. Non-ischemic cardiomyopathy with acute on chronic systolic CHF and cardiogenic shock preop.  2. Expected postop acute blood loss anemia 3. Small chronic laminated basilar anterior wall LV thrombus not removed 4. Therapeutic on coumadin 5. Making excellent progress with rehab. 6. Small loculated left hydropneumothorax on CXR.   Plan/Discussion:    He is hemodynamically stable on 9600 rpm with stable MAP on lisinopril 5 mg daily.  INR therapeutic. Pharmacy ordering  Doing well with IS and ambulation  Continue teaching. Waiting for mother to arrive this weekend.      I reviewed the LVAD parameters from today, and compared the results to the patient's prior recorded data.  No programming changes were made.  The LVAD is functioning within specified parameters.  The patient performs LVAD self-test daily.  LVAD interrogation was negative for any significant power changes, alarms or PI events/speed drops.  LVAD equipment check completed and is in good working order.  Back-up equipment present.   LVAD education done on emergency procedures and precautions and reviewed exit site care.  Length of Stay: [address]  Payton Doughty [facility] 12/01/2015, 1:43 PM

## 2015-12-01 NOTE — Progress Notes (Signed)
Patient ID: Matthew Vaughan, male   DOB: 06-08-1956, 60 y.o.   MRN: [id number] HeartMate 2 Rounding Note  Subjective:    POD 5 S/P HMII LVAD.  Extubated 5/9 and swan removed. Chest tubes all out.   Milrinone off.   S/p Ramp Echo 11/30/15. Speed 9000 => 9600 CXR 11/30/15 with small loculated left hydropneumothorax. Cannula and pump position appeared stable.  On warfarin per pharmacy. INR 2.04.  LDH 249.  Bilirubin stable. Hgb stable.  MAPS 80s. HR remains in 90-100s.   Feels good this morning. Pocket pain improving.  No SOB walking with CR. Heart doesn't feel like it is "thumping" anymore this am.   LVAD INTERROGATION:  HeartMate II LVAD:  Flow 5.5 liters/min, speed 9600, power 6.0, PI 3.9.  No PI events .   Objective:    Vital Signs:   Temp:  [97.8 F (36.6 C)-98.3 F (36.8 C)] 97.8 F (36.6 C) (05/18 0512) Pulse Rate:  [101-111] 103 (05/18 0512) Resp:  [18] 18 (05/18 0512) SpO2:  [98 %-99 %] 99 % (05/18 0512) Weight:  [156 lb 12 oz (71.1 kg)] 156 lb 12 oz (71.1 kg) (05/18 0512) Last BM Date: 11/30/15 Mean arterial Pressure 80-90s   Intake/Output:   Intake/Output Summary (Last 24 hours) at 12/01/15 0723 Last data filed at 12/01/15 0515  Gross per 24 hour  Intake    600 ml  Output    600 ml  Net      0 ml     Physical Exam: General:  WDWN HEENT: Normal Neck: supple. JVP not elevated. Carotids 2+ bilat; no bruits. No thyromegaly or nodule noted.  Cor: Mechanical heart sounds with LVAD hum present. Sternal dressing intact. No audible click in supine position. Lungs: CTAB, normal effort Abdomen: soft, NT, ND, no HSM. No bruits or masses. +BS   Driveline: C/D/I; securement device intact and driveline incorporated Extremities: no cyanosis, clubbing, rash, edema Neuro: Alert and oriented x3   Telemetry: NSRS/Sinus Tach 90-100s  Labs: Basic Metabolic Panel:  Recent Labs Lab 11/25/15 0402 11/26/15 0445  11/27/15 0510 11/28/15 0536 11/29/15 0447 11/30/15 1115  12/01/15 0540  NA 134*  --   < > 133* 132* 134* 133* 130*  K 4.2  --   < > 4.2 4.3 4.3 5.2* 4.2  CL 98*  --   < > 96* 96* 95* 93* 93*  CO2 27  --   < > GLUCOSE 101*  --   < > 111* 114* 118* 104* 128*  BUN 10  --   < > CREATININE 1.04  --   < > 0.99 0.99 0.85 1.02 1.04  CALCIUM 9.2  --   < > 8.6* 8.4* 8.8* 9.3 8.9  MG 1.6* 1.5*  --  1.8 1.9  --   --   --   PHOS 4.1  --   --   --   --   --   --   --   < > = values in this interval not displayed.  Liver Function Tests:  Recent Labs Lab 11/25/15 0402 11/26/15 0815 11/27/15 0510 11/28/15 0536 11/29/15 0447  AST 32 34 31 35 52*  ALT 35  ALKPHOS 70 82 96 133* 192*  BILITOT 10.0* 5.3* 3.5* 2.8* 2.8*  PROT 6.3* 6.5 6.7 6.7 6.5  ALBUMIN 2.3* 2.3* 2.2* 2.3* 2.2*   No results for input(s): LIPASE, AMYLASE in the last  168 hours. No results for input(s): AMMONIA in the last 168 hours.  CBC:  Recent Labs Lab 11/25/15 0402 11/26/15 0445 11/27/15 0510 11/28/15 0536 11/29/15 0447 11/30/15 1115 12/01/15 0540  WBC 10.1 9.0 8.0 9.1 8.0 10.4 8.9  NEUTROABS 7.5 6.3 5.1 5.4  --   --   --   HGB 9.0* 9.5* 9.5* 10.3* 9.3* 10.1* 9.7*  HCT 28.3* 29.2* 27.8* 30.7* 28.6* 30.1* 30.0*  MCV 99.3 98.3 98.9 99.0 98.6 99.3 97.7  PLT 207 249 284 359 381 457* 473*    INR:  Recent Labs Lab 11/27/15 0510 11/28/15 0536 11/29/15 0447 11/30/15 0522 12/01/15 0540  INR 2.46* 2.43* 2.21* 2.07* 2.04*    Other results:  EKG:   Imaging: Dg Chest 2 View  11/30/2015  CLINICAL DATA:  Followup LVAD EXAM: CHEST  2 VIEW COMPARISON:  11/27/2015 FINDINGS: Cardiac shadow remains enlarged. A left ventricular assist device is again seen. Small hydro pneumothorax is again noted on the left stable from the prior exam. In a apical component to the pneumothorax is not well seen. Right-sided PICC line is again noted in the proximal superior vena cava. Some minimal changes are seen in the right upper lobe consistent with  regressing infiltrate from the right upper lobe. IMPRESSION: Overall appearance is stable from the previous exam. Electronically Signed   By: Alcide Clever M.D.   On: 11/30/2015 08:26     Medications:     Scheduled Medications: . aspirin  81 mg Oral Daily  . bisacodyl  10 mg Oral Daily   Or  . bisacodyl  10 mg Rectal Daily  . digoxin  0.125 mg Oral Daily  . docusate sodium  200 mg Oral Daily  . feeding supplement (ENSURE ENLIVE)  237 mL Oral BID BM  . fentaNYL  75 mcg Transdermal Q72H  . furosemide  40 mg Oral Daily  . lisinopril  5 mg Oral Daily  . pantoprazole  40 mg Oral Daily  . potassium chloride  20 mEq Oral Daily  . sodium chloride flush  10-40 mL Intracatheter Q12H  . sodium chloride flush  3 mL Intravenous Q12H  . Warfarin - Pharmacist Dosing Inpatient   Does not apply q1800    Infusions: . sodium chloride Stopped (11/22/15 1500)  . sodium chloride 10 mL/hr at 11/26/15 0700    PRN Medications: hydrALAZINE, ondansetron (ZOFRAN) IV, oxyCODONE, sodium chloride flush, sodium chloride flush, traMADol   Assessment/Plan/Discussion   1. S/P HMII LVAD POD #5: Stable. He is now off norepinephrine and milrinone.  MAP 80s-90.  HR now around 100, ST (goes up when in more pain). VAD parameters stable at 9600 rpm.  - INR at goal (2-2.5).  ASA 81.  Pharmacy dosing coumadin - Ramp echo yesterday, speed increased to 9600. - BP stable on lisinopril 5 daily.  2. Acute/chronic systolic CHF:  Cardiogenic shock. Nonischemic cardiomyopathy.  - Volume status stable on exam. Continue po lasix.   3. LV thrombus: Small thrombus on MRI.  On Warfarin, goal INR 2-2.5.  4. Anemia: Expected blood loss from surgery. Hemoglobin stable. Continue to follow.  5. Rhythm: He remains in sinus tachycardia, HR 100s-110s.  Rises with pocket pain. 6. Hyperbilirubinemia: Total bilirubin 9-10 range post-op, trended down  Suspect this is related to CHF.   - LDH is ok and no evidence for pump thrombus.  7.  Disposition: Ongoing LVAD teaching. Likely early next week with family not arriving until the weekend.  Mother arrives this weekend.  Home  most likely Monday.   I reviewed the LVAD parameters from today, and compared the results to the patient's prior recorded data.  No programming changes were made.  The LVAD is functioning within specified parameters.  The patient performs LVAD self-test daily.  LVAD interrogation was negative for any significant power changes, alarms or PI events/speed drops.  LVAD equipment check completed and is in good working order.  Back-up equipment present.   LVAD education done on emergency procedures and precautions and reviewed exit site care.  Length of Stay: [address]  [employer] 12/01/2015, 7:23 AM  VAD Team --- VAD ISSUES ONLY--- Pager [phone] (7am - 7am)  Advanced Heart Failure Team  Pager [phone] (M-F; 7a - 4p)  Please contact [facility] for night-coverage after hours (4p -7a ) and weekends on [email or web address]  Patient seen with PA, agree with the above note.  We turned his speed up to 9600 yesterday.  He is doing quite well today, feels good.  Walking more every day.  Right now, plan is for discharge on Monday after his mom arrives over the weekend.   [name] 12/01/2015 4:21 PM

## 2015-12-01 NOTE — Progress Notes (Signed)
Physical Therapy Treatment Patient Details Name: Matthew Vaughan MRN: [id number] DOB: Nov 11, 1955 Today's Date: 12/01/2015    History of Present Illness 60 y.o. male. Residence is in [place] but was visiting family in [place] when he came to hospital. Hx non-ischemic CM. EF ~ 20% historically. HTN. Chronic Coumadin for hx LV thrombus. S/p cath in 2015. Presents with Non-ischemic cardiomyopathy with acute on chronic systolic CHF and cardiogenic shock. Patient is now s/p LVAD implantation.    PT Comments    Pt admitted with above diagnosis. Pt currently with functional limitations due to the deficits listed below (see PT Problem List). Pt was able to go up and down 20 steps without physical assist.  Pt continues to progress.  Cardiac Rehab seeing pt as well and told pt that he can walk with nursing.  Should d/c PT after next visit which will be first of next week as Cardiac Rehab will see him tomorrow and over weekend. Pt will benefit from skilled PT to increase their independence and safety with mobility to allow discharge to the venue listed below.    Follow Up Recommendations  Home health PT;Supervision/Assistance - 24 hour     Equipment Recommendations  Other (comment) (rollator)    Recommendations for Other Services       Precautions / Restrictions Precautions Precautions: Fall;Sternal Restrictions Other Position/Activity Restrictions: sternal precautions    Mobility  Bed Mobility Overal bed mobility: Independent                Transfers Overall transfer level: Modified independent Equipment used: None Transfers: Sit to/from Stand Sit to Stand: Modified independent (Device/Increase time)         General transfer comment: no assist from bed or recliner, hands on knees  Ambulation/Gait Ambulation/Gait assistance: Supervision Ambulation Distance (Feet): 1000 Feet Assistive device: 4-wheeled walker Gait Pattern/deviations: Step-through pattern;Decreased stride  length   Gait velocity interpretation: Below normal speed for age/gender General Gait Details: Steady.  uses rollator to carry equipment   Stairs Stairs: Yes Stairs assistance: Supervision Stair Management: Two rails;One rail Right;Step to pattern;Forwards;Sideways Number of Stairs: 20 General stair comments: Pt was able to ascend and descend stairs with cues only and no assist physically.  Pt used bil rails to go up stairs and came down sideways holding onto right rail with bil hands and doing one step at a time.  Reported fatigue at end of walk.   Wheelchair Mobility    Modified Rankin (Stroke Patients Only)       Balance Overall balance assessment: Needs assistance Sitting-balance support: No upper extremity supported;Feet supported Sitting balance-Leahy Scale: Good     Standing balance support: During functional activity;No upper extremity supported Standing balance-Leahy Scale: Fair Standing balance comment: Pt can stand statically without support but cannot tolerate challenges to balance without UE support.              High level balance activites: Direction changes;Turns;Sudden stops High Level Balance Comments: supervision to min guard assist for above.    Cognition Arousal/Alertness: Awake/alert Behavior During Therapy: WFL for tasks assessed/performed Overall Cognitive Status: Within Functional Limits for tasks assessed                      Exercises General Exercises - Lower Extremity Long Arc Quad: AROM;Both;10 reps;Seated    General Comments General comments (skin integrity, edema, etc.): Pt manages equipment without cues or assist.       Pertinent Vitals/Pain Pain Assessment: No/denies pain  VSS  Home Living                      Prior Function            PT Goals (current goals can now be found in the care plan section) Progress towards PT goals: Progressing toward goals    Frequency  Min 2X/week    PT Plan  Current plan remains appropriate    Co-evaluation             End of Session Equipment Utilized During Treatment: Gait belt Activity Tolerance: Patient tolerated treatment well Patient left: in bed;with call bell/phone within reach     Time: 1332-1410 PT Time Calculation (min) (ACUTE ONLY): 38 min  Charges:  $Gait Training: 38-52 mins                    G CodesBerline Lopes 2015-12-07, 3:20 PM [name],PT Acute Rehabilitation [phone] (pager)

## 2015-12-01 NOTE — Progress Notes (Signed)
Utilization review completed.  

## 2015-12-01 NOTE — Progress Notes (Signed)
RN paged Kirt Boys regarding pt PI 2.3. Pt asymptomatic. Molly informed RN to call Dr. Shirlee Latch.  Dr.McLean paged. M.D returned page and is aware. MD informed RN to continue to monitor at this time. No other orders received.  Roselie Awkward, RN

## 2015-12-01 NOTE — Progress Notes (Signed)
CSW met with patient at bedside. Patient shared he is feeling more confident with ambulation and management of the LVAD. Patient mentioned that he did stairs and feeling stronger and hopeful for discharge home soon. Patient reports he continues to read manual and family will be in for bedside teaching on Monday as planned. Patient appears to be in good spirits and positive about his LVAD experience. CSW continues to follow for support and recovery. Raquel Sarna, LCSW [phone]

## 2015-12-01 NOTE — Progress Notes (Signed)
CARDIAC REHAB PHASE I   PRE:  Rate/Rhythm: 98 SR  BP:  Sitting: 80 MAP        SaO2: 98 RA  MODE:  Ambulation: 1930 ft   POST:  Rate/Rhythm: 115 ST  BP:  Sitting: 78 MAP         SaO2: 98 RA  Pt agreeable to walk, able to switch from wall power source to batteries independently. Pt ambulated 1930 ft on RA, rollator, hand held assist, steady gait tolerated well with no complaints. Brief standing rest x4 to straighten posture. Pt to edge of bed after walk per pt request, eating lunch, call bell within reach. Will follow.   [phone] Joylene Grapes, RN, BSN 12/01/2015 12:16 PM

## 2015-12-01 NOTE — Progress Notes (Signed)
ANTICOAGULATION CONSULT NOTE - Follow Up Consult  Pharmacy Consult for Warfarin Indication: LV thrombus / LVAD  No Known Allergies  Patient Measurements: Height: 5\' 11"  (180.3 cm) Weight: 156 lb 12 oz (71.1 kg) IBW/kg (Calculated) : 75.3  Vital Signs: Temp: 97.8 F (36.6 C) (05/18 0512) Temp Source: Oral (05/18 0512) Pulse Rate: 103 (05/18 0512)  Labs:  Recent Labs  11/29/15 0447 11/30/15 0522 11/30/15 1115 12/01/15 0540  HGB 9.3*  --  10.1* 9.7*  HCT 28.6*  --  30.1* 30.0*  PLT 381  --  457* 473*  LABPROT 24.4* 23.2*  --  22.9*  INR 2.21* 2.07*  --  2.04*  CREATININE 0.85  --  1.02 1.04    Estimated Creatinine Clearance: 77.9 mL/min (by C-G formula based on Cr of 1.04).  Assessment: 60 yo M on warfarin PTA for hx LV thrombus, transitioned to IV heparin on admission pending cath. S/P cath 4/24, found to have NICM, resumed heparin as bridge to LVAD-S/P LVAD on 5/8.  Warfarin resumed 5/9 after discussion with MD.  INR currently 2.04 - had been trending down on warf 2.5mg  qd - gave boost yesterday now stable ASA 81mg  qd H/H slight drop 10/30> 9/28 watch PLTC stable 300s- no bleeding or complications noted.  Goal of Therapy:  INR 2-2.5 Monitor platelets by anticoagulation protocol: Yes   Plan:  Coumadin 5mg  MF/2.5mg  TTSS Daily INR  Leota Sauers Pharm.D. CPP, BCPS Clinical Pharmacist [phone] 12/01/2015 2:33 PM

## 2015-12-02 LAB — BASIC METABOLIC PANEL
Anion gap: 8 (ref 5–15)
BUN: 9 mg/dL (ref 6–20)
CHLORIDE: 95 mmol/L — AB (ref 101–111)
CO2: 30 mmol/L (ref 22–32)
Calcium: 9.3 mg/dL (ref 8.9–10.3)
Creatinine, Ser: 1.03 mg/dL (ref 0.61–1.24)
GFR calc non Af Amer: >60 mL/min (ref >60)
Glucose, Bld: 114 mg/dL — ABNORMAL HIGH (ref 65–99)
POTASSIUM: 4.5 mmol/L (ref 3.5–5.1)
SODIUM: 133 mmol/L — AB (ref 135–145)

## 2015-12-02 LAB — LACTATE DEHYDROGENASE: LDH: 262 U/L — AB (ref 98–192)

## 2015-12-02 LAB — PROTIME-INR
INR: 1.98 — ABNORMAL HIGH (ref 0.00–1.49)
Prothrombin Time: 22.4 seconds — ABNORMAL HIGH (ref 11.6–15.2)

## 2015-12-02 NOTE — Progress Notes (Signed)
CARDIAC REHAB PHASE I   PRE:  Rate/Rhythm: 94 SR with PVC    BP: sitting 76    SaO2: wouldn't register  MODE:  Ambulation: 2040 ft   POST:  Rate/Rhythm: 122 ST with PACs    BP: sitting 72     SaO2: wouldn't register  Pt doing excellent. Independent with everything. Used rollator. Walked outside and sat for a while. Needed reminders to lock brakes on rollator. To recliner. Pt would like rollator for home.   [phone]  Harriet Masson CES, ACSM 12/02/2015 2:47 PM

## 2015-12-02 NOTE — Progress Notes (Signed)
VAD coordinator called brother to schedule VAD discharge teaching.  Scheduled for Tuesday, 12/06/15 at 1:00 pm. Will plan on patient, brother, and mother if available for that date.

## 2015-12-02 NOTE — Progress Notes (Signed)
Pt had 5 beats of Vtach. Pt asymptomatic resting in bed. MAP 74  VAD parameters:  -speed 9600 -PI 3.3 -flow 4.7 -power 5.7  RN will continue to monitor.   Roselie Awkward ,RN

## 2015-12-02 NOTE — Progress Notes (Signed)
LVAD Exit Site:  VAD dressing removed and site care performed using sterile technique. Drive line exit site cleaned with Chlora prep applicators x 2, allowed to dry, and gauze dressing with aquacel strip re-applied. Exit site with minimal tissue ingrowth and two sutures intact. The velour is fully implanted at exit site. No redness, tenderness, or foul odor;  small amount tan drainage noted. Drive line anchor in place and accurately applied. Driveline dressing is being changed daily per sterile technique.  Brother at bedside and observed dressing change. Demonstrated sterile technique; he performed return demonstration of donning sterile gloves. Practice gloves provided for home practice. 2W nurses will continue to teach brother how to perform dressing changes so that he can perform under nursing supervision prior to patient discharge home.

## 2015-12-02 NOTE — Progress Notes (Signed)
Patient ID: Matthew Vaughan, male   DOB: 01/25/1956, 60 y.o.   MRN: [id number] HeartMate 2 Rounding Note  Subjective:    POD 5 S/P HMII LVAD.  Extubated 5/9 and swan removed. Chest tubes all out.   S/p Ramp Echo 11/30/15. Speed 9000 => 9600 CXR 11/30/15 with small loculated left hydropneumothorax. Cannula and pump position appeared stable.  On warfarin per pharmacy. INR 1.94.  LDH trending up slightly 233 -> 249 -> 262. Hgb stable yesterday. No overt bleeding  MAPS 70s this am. Feeling great.  States he walked 2 flights of stairs yesterday without any problem. Denies lightheadedness or dizziness. His mother arrives tomorrow.  Excited to go home.   LVAD INTERROGATION:  HeartMate II LVAD:  Flow 4.6 liters/min, speed 9600, power 5.7, PI 3.1.  5 PI events  Objective:    Vital Signs:   Temp:  [98.2 F (36.8 C)-98.5 F (36.9 C)] 98.2 F (36.8 C) (05/19 0528) Pulse Rate:  [86-102] 86 (05/19 0528) Resp:  [16-18] 18 (05/19 0528) SpO2:  [96 %-99 %] 99 % (05/19 0528) Weight:  [154 lb 14.4 oz (70.262 kg)] 154 lb 14.4 oz (70.262 kg) (05/19 0528) Last BM Date: 11/30/15 Mean arterial Pressure 70-80s  Intake/Output:   Intake/Output Summary (Last 24 hours) at 12/02/15 0808 Last data filed at 12/02/15 0534  Gross per 24 hour  Intake    620 ml  Output    200 ml  Net    420 ml     Physical Exam: General:  Well appearing HEENT: Normal Neck: supple. JVP not elevated. Carotids 2+ bilat; no bruits. No thyromegaly or nodule noted.  Cor: Mechanical heart sounds with LVAD hum present. Sternal dressing intact. No audible click Lungs: clear Abdomen: soft, non-tender, non-distended, no HSM. No bruits or masses. +BS   Driveline: C/D/I; securement device intact and driveline incorporated Extremities: no cyanosis, clubbing, rash, edema Neuro: Alert and oriented x3   Telemetry: NSR/Sinus Tach 90-100s  Labs: Basic Metabolic Panel:  Recent Labs Lab 11/26/15 0445  11/27/15 0510 11/28/15 0536  11/29/15 0447 11/30/15 1115 12/01/15 0540 12/02/15 0425  NA  --   < > 133* 132* 134* 133* 130* 133*  K  --   < > 4.2 4.3 4.3 5.2* 4.2 4.5  CL  --   < > 96* 96* 95* 93* 93* 95*  CO2  --   < > GLUCOSE  --   < > 111* 114* 118* 104* 128* 114*  BUN  --   < > CREATININE  --   < > 0.99 0.99 0.85 1.02 1.04 1.03  CALCIUM  --   < > 8.6* 8.4* 8.8* 9.3 8.9 9.3  MG 1.5*  --  1.8 1.9  --   --   --   --   < > = values in this interval not displayed.  Liver Function Tests:  Recent Labs Lab 11/26/15 0815 11/27/15 0510 11/28/15 0536 11/29/15 0447  AST 34 31 35 52*  ALT 35  ALKPHOS 82 96 133* 192*  BILITOT 5.3* 3.5* 2.8* 2.8*  PROT 6.5 6.7 6.7 6.5  ALBUMIN 2.3* 2.2* 2.3* 2.2*   No results for input(s): LIPASE, AMYLASE in the last 168 hours. No results for input(s): AMMONIA in the last 168 hours.  CBC:  Recent Labs Lab 11/26/15 0445 11/27/15 0510 11/28/15 0536 11/29/15 0447 11/30/15 1115 12/01/15 0540  WBC 9.0 8.0 9.1  8.0 10.4 8.9  NEUTROABS 6.3 5.1 5.4  --   --   --   HGB 9.5* 9.5* 10.3* 9.3* 10.1* 9.7*  HCT 29.2* 27.8* 30.7* 28.6* 30.1* 30.0*  MCV 98.3 98.9 99.0 98.6 99.3 97.7  PLT 249 284 359 381 457* 473*    INR:  Recent Labs Lab 11/28/15 0536 11/29/15 0447 11/30/15 0522 12/01/15 0540 12/02/15 0425  INR 2.43* 2.21* 2.07* 2.04* 1.98*    Other results:  EKG:   Imaging: Dg Chest 2 View  11/30/2015  CLINICAL DATA:  Followup LVAD EXAM: CHEST  2 VIEW COMPARISON:  11/27/2015 FINDINGS: Cardiac shadow remains enlarged. A left ventricular assist device is again seen. Small hydro pneumothorax is again noted on the left stable from the prior exam. In a apical component to the pneumothorax is not well seen. Right-sided PICC line is again noted in the proximal superior vena cava. Some minimal changes are seen in the right upper lobe consistent with regressing infiltrate from the right upper lobe. IMPRESSION: Overall appearance is  stable from the previous exam. Electronically Signed   By: Alcide Clever M.D.   On: 11/30/2015 08:26     Medications:     Scheduled Medications: . aspirin  81 mg Oral Daily  . bisacodyl  10 mg Oral Daily   Or  . bisacodyl  10 mg Rectal Daily  . digoxin  0.125 mg Oral Daily  . docusate sodium  200 mg Oral Daily  . feeding supplement (ENSURE ENLIVE)  237 mL Oral BID BM  . fentaNYL  75 mcg Transdermal Q72H  . furosemide  40 mg Oral Daily  . lisinopril  5 mg Oral Daily  . pantoprazole  40 mg Oral Daily  . potassium chloride  20 mEq Oral Daily  . sodium chloride flush  10-40 mL Intracatheter Q12H  . sodium chloride flush  3 mL Intravenous Q12H  . warfarin  2.5 mg Oral Once per day on Sun Tue Thu Sat  . warfarin  5 mg Oral Once per day on Mon Wed Fri  . Warfarin - Pharmacist Dosing Inpatient   Does not apply q1800    Infusions: . sodium chloride Stopped (11/22/15 1500)  . sodium chloride 10 mL/hr at 11/26/15 0700    PRN Medications: hydrALAZINE, ondansetron (ZOFRAN) IV, oxyCODONE, sodium chloride flush, sodium chloride flush, traMADol   Assessment/Plan/Discussion   1. S/P HMII LVAD POD #5: Stable. He is now off norepinephrine and milrinone.  MAP 80s-90.  HR now around 100, ST (goes up when in more pain). VAD parameters stable at 9600 rpm.  - INR just under therapeutic range. (2-2.5).  ASA 81.  Pharmacy dosing coumadin - Ramp echo 11/30/15, speed increased to 9600. - BP stable on lisinopril 5 daily.  2. Acute/chronic systolic CHF:  Cardiogenic shock. Nonischemic cardiomyopathy.  - Volume status stable on exam. Continue po lasix.   3. LV thrombus: Small thrombus on MRI.  On Warfarin, goal INR 2-2.5.  4. Anemia: Expected blood loss from surgery. Hemoglobin stable. Continue to follow.  5. Rhythm: He remains in sinus tachycardia, HR 100s-110s.  Rises with pocket pain. 6. Hyperbilirubinemia: Total bilirubin 9-10 range post-op, trended down  Suspect this is related to CHF.   - LDH  slightly trending up, no evidence for pump thrombus. Continue to follow.  7. Disposition: Ongoing LVAD teaching. Discharge early next week.   Mother arrives this weekend.  Discharge planned for  Monday.   I reviewed the LVAD parameters from today, and compared  the results to the patient's prior recorded data.  No programming changes were made.  The LVAD is functioning within specified parameters.  The patient performs LVAD self-test daily.  LVAD interrogation was negative for any significant power changes, alarms or PI events/speed drops.  LVAD equipment check completed and is in good working order.  Back-up equipment present.   LVAD education done on emergency procedures and precautions and reviewed exit site care.  Length of Stay: [address]  Graciella Freer, [place] 12/02/2015, 8:08 AM  VAD Team --- VAD ISSUES ONLY--- Pager [phone] (7am - 7am)  Advanced Heart Failure Team  Pager [phone] (M-F; 7a - 4p)  Please contact [facility] for night-coverage after hours (4p -7a ) and weekends on [email or web address]  Patient seen with PA, agree with the above note. He continues to do well, walks with rehab, slight uptick in LDH but essentially stable.  Plan for discharge on Monday after his mother gets in town this weekend. She will be staying with him at home.   [name] 12/02/2015 12:06 PM

## 2015-12-02 NOTE — Progress Notes (Signed)
Patient ID: Matthew Vaughan, male   DOB: 12/07/1955, 60 y.o.   MRN: [id number]  HeartMate 2 Rounding Note  Subjective:    He feels well. Sleeping well, eating well and ambulating without any problems. Sitting up reading his manual. Anxious to get home.   LVAD INTERROGATION:  HeartMate II LVAD:  Flow 5.2 liters/min, speed 9600, power 6, PI 3.4.    Objective:    Vital Signs:   Temp:  [98.2 F (36.8 C)-98.5 F (36.9 C)] 98.2 F (36.8 C) (05/19 0528) Pulse Rate:  [86-102] 86 (05/19 0528) Resp:  [16-18] 18 (05/19 0528) SpO2:  [96 %-99 %] 99 % (05/19 0528) Weight:  [70.262 kg (154 lb 14.4 oz)] 70.262 kg (154 lb 14.4 oz) (05/19 0528) Last BM Date: 11/30/15 Mean arterial Pressure 70-80's  Intake/Output:   Intake/Output Summary (Last 24 hours) at 12/02/15 1628 Last data filed at 12/02/15 0900  Gross per 24 hour  Intake    500 ml  Output    200 ml  Net    300 ml     Physical Exam: General:  Well appearing. No resp difficulty HEENT: normal Cor: normal heart sounds with LVAD hum present. Lungs: clear Incisions healing well Abdomen: soft, nontender, nondistended. No hepatosplenomegaly. No bruits or masses. Good bowel sounds. Extremities: no cyanosis, clubbing, rash, edema Neuro: alert & orientedx3, cranial nerves grossly intact. moves all 4 extremities w/o difficulty. Affect pleasant  Telemetry: sinus 90-100  Labs: Basic Metabolic Panel:  Recent Labs Lab 11/26/15 0445  11/27/15 0510 11/28/15 0536 11/29/15 0447 11/30/15 1115 12/01/15 0540 12/02/15 0425  NA  --   < > 133* 132* 134* 133* 130* 133*  K  --   < > 4.2 4.3 4.3 5.2* 4.2 4.5  CL  --   < > 96* 96* 95* 93* 93* 95*  CO2  --   < > GLUCOSE  --   < > 111* 114* 118* 104* 128* 114*  BUN  --   < > CREATININE  --   < > 0.99 0.99 0.85 1.02 1.04 1.03  CALCIUM  --   < > 8.6* 8.4* 8.8* 9.3 8.9 9.3  MG 1.5*  --  1.8 1.9  --   --   --   --   < > = values in this interval not  displayed.  Liver Function Tests:  Recent Labs Lab 11/26/15 0815 11/27/15 0510 11/28/15 0536 11/29/15 0447  AST 34 31 35 52*  ALT 35  ALKPHOS 82 96 133* 192*  BILITOT 5.3* 3.5* 2.8* 2.8*  PROT 6.5 6.7 6.7 6.5  ALBUMIN 2.3* 2.2* 2.3* 2.2*   No results for input(s): LIPASE, AMYLASE in the last 168 hours. No results for input(s): AMMONIA in the last 168 hours.  CBC:  Recent Labs Lab 11/26/15 0445 11/27/15 0510 11/28/15 0536 11/29/15 0447 11/30/15 1115 12/01/15 0540  WBC 9.0 8.0 9.1 8.0 10.4 8.9  NEUTROABS 6.3 5.1 5.4  --   --   --   HGB 9.5* 9.5* 10.3* 9.3* 10.1* 9.7*  HCT 29.2* 27.8* 30.7* 28.6* 30.1* 30.0*  MCV 98.3 98.9 99.0 98.6 99.3 97.7  PLT 249 284 359 381 457* 473*    INR:  Recent Labs Lab 11/28/15 0536 11/29/15 0447 11/30/15 0522 12/01/15 0540 12/02/15 0425  INR 2.43* 2.21* 2.07* 2.04* 1.98*   LDH 262  Other results:  EKG:   Imaging:  No  results found.   Medications:     Scheduled Medications: . aspirin  81 mg Oral Daily  . bisacodyl  10 mg Oral Daily   Or  . bisacodyl  10 mg Rectal Daily  . digoxin  0.125 mg Oral Daily  . docusate sodium  200 mg Oral Daily  . feeding supplement (ENSURE ENLIVE)  237 mL Oral BID BM  . fentaNYL  75 mcg Transdermal Q72H  . furosemide  40 mg Oral Daily  . lisinopril  5 mg Oral Daily  . pantoprazole  40 mg Oral Daily  . potassium chloride  20 mEq Oral Daily  . sodium chloride flush  10-40 mL Intracatheter Q12H  . sodium chloride flush  3 mL Intravenous Q12H  . warfarin  2.5 mg Oral Once per day on Sun Tue Thu Sat  . warfarin  5 mg Oral Once per day on Mon Wed Fri  . Warfarin - Pharmacist Dosing Inpatient   Does not apply q1800     Infusions: . sodium chloride Stopped (11/22/15 1500)  . sodium chloride 10 mL/hr at 11/26/15 0700     PRN Medications:  hydrALAZINE, ondansetron (ZOFRAN) IV, oxyCODONE, sodium chloride flush, sodium chloride flush, traMADol   Assessment:   POD 11 S/P  HeartMate II LVAD  1. Non-ischemic cardiomyopathy with acute on chronic systolic CHF and cardiogenic shock preop.  2. Expected postop acute blood loss anemia 3. Small chronic laminated basilar anterior wall LV thrombus not removed 4. Therapeutic on coumadin 5. Making excellent progress with rehab. 6. Small loculated left hydropneumothorax on CXR.  Plan/Discussion:    He looks great overall. Awaiting his mother's arrival this weekend to begin teaching as caretaker.  Coumadin per pharmacy.   I reviewed the LVAD parameters from today, and compared the results to the patient's prior recorded data.  No programming changes were made.  The LVAD is functioning within specified parameters.  The patient performs LVAD self-test daily.  LVAD interrogation was negative for any significant power changes, alarms or PI events/speed drops.  LVAD equipment check completed and is in good working order.  Back-up equipment present.   LVAD education done on emergency procedures and precautions and reviewed exit site care.  Length of Stay: [address]  Payton Doughty [facility] 12/02/2015, 4:28 PM

## 2015-12-02 NOTE — Progress Notes (Signed)
VAD Coordinator called mother (identified as primary caregiver) to schedule discharge teaching this week in preparation for patient discharge. Mother will not be here until next week, unsure of date. Stressed importance of discharge teaching and learning and performing dressing changes under nursing supervision prior to discharge.

## 2015-12-02 NOTE — Progress Notes (Signed)
VAD coordinator delivered VAD Discharge binder to patient's room and reviewed contents.  Instructed pt to read HM II Patient Handbook and start keeping daily log of his VAD parameters, wt, temperature, and daily self tests of controller and Power Module. Also instructed pt to have caregivers complete reading of Patient Handbook. Pt verbalized understanding of same.

## 2015-12-02 NOTE — Progress Notes (Signed)
ANTICOAGULATION CONSULT NOTE - Follow Up Consult  Pharmacy Consult for Coumadin Indication: LV thrombus / LVAD  No Known Allergies  Patient Measurements: Height: 5\' 11"  (180.3 cm) Weight: 154 lb 14.4 oz (70.262 kg) IBW/kg (Calculated) : 75.3  Vital Signs: Temp: 98.2 F (36.8 C) (05/19 0528) Temp Source: Oral (05/19 0528) Pulse Rate: 86 (05/19 0528)  Labs:  Recent Labs  11/30/15 0522 11/30/15 1115 12/01/15 0540 12/02/15 0425  HGB  --  10.1* 9.7*  --   HCT  --  30.1* 30.0*  --   PLT  --  457* 473*  --   LABPROT 23.2*  --  22.9* 22.4*  INR 2.07*  --  2.04* 1.98*  CREATININE  --  1.02 1.04 1.03    Estimated Creatinine Clearance: 77.7 mL/min (by C-G formula based on Cr of 1.03).  Assessment: 60 yo M on coumadin PTA for hx LV thrombus, transitioned to IV heparin on admission pending cath. S/P cath 4/24, found to have NICM, resumed heparin as bridge to LVAD-S/P LVAD on 5/8. Coumadin resumed 5/9.   Today's INR is essentially at goal 1.98 - due for 5mg  dose tonight. No CBC today. Noted plan for discharge on Monday.  Goal of Therapy:  INR 2-2.5 Monitor platelets by anticoagulation protocol: Yes   Plan:  1) Coumadin 5mg  MWF, 2.5mg  TTSS 2) Daily INR  Louie Casa, PharmD, BCPS 12/02/2015 2:18 PM

## 2015-12-03 LAB — BASIC METABOLIC PANEL
Anion gap: 9 (ref 5–15)
BUN: 11 mg/dL (ref 6–20)
CHLORIDE: 92 mmol/L — AB (ref 101–111)
CO2: 30 mmol/L (ref 22–32)
Calcium: 9 mg/dL (ref 8.9–10.3)
Creatinine, Ser: 1.01 mg/dL (ref 0.61–1.24)
GFR calc Af Amer: >60 mL/min (ref >60)
GFR calc non Af Amer: >60 mL/min (ref >60)
GLUCOSE: 113 mg/dL — AB (ref 65–99)
POTASSIUM: 4.4 mmol/L (ref 3.5–5.1)
Sodium: 131 mmol/L — ABNORMAL LOW (ref 135–145)

## 2015-12-03 LAB — CBC WITH DIFFERENTIAL/PLATELET
Basophils Absolute: 0 10*3/uL (ref 0.0–0.1)
Basophils Relative: 0 %
EOS PCT: 9 %
Eosinophils Absolute: 0.6 10*3/uL (ref 0.0–0.7)
HCT: 28.7 % — ABNORMAL LOW (ref 39.0–52.0)
Hemoglobin: 9 g/dL — ABNORMAL LOW (ref 13.0–17.0)
LYMPHS ABS: 1.6 10*3/uL (ref 0.7–4.0)
LYMPHS PCT: 22 %
MCH: 30.9 pg (ref 26.0–34.0)
MCHC: 31.4 g/dL (ref 30.0–36.0)
MCV: 98.6 fL (ref 78.0–100.0)
MONO ABS: 1.1 10*3/uL — AB (ref 0.1–1.0)
MONOS PCT: 15 %
Neutro Abs: 3.8 10*3/uL (ref 1.7–7.7)
Neutrophils Relative %: 54 %
PLATELETS: 447 10*3/uL — AB (ref 150–400)
RBC: 2.91 MIL/uL — ABNORMAL LOW (ref 4.22–5.81)
RDW: 14.1 % (ref 11.5–15.5)
WBC: 7.1 10*3/uL (ref 4.0–10.5)

## 2015-12-03 LAB — LACTATE DEHYDROGENASE: LDH: 265 U/L — ABNORMAL HIGH (ref 98–192)

## 2015-12-03 LAB — PROTIME-INR
INR: 1.99 — AB (ref 0.00–1.49)
Prothrombin Time: 22.5 seconds — ABNORMAL HIGH (ref 11.6–15.2)

## 2015-12-03 MED ORDER — WARFARIN SODIUM 5 MG PO TABS
5.0000 mg | ORAL_TABLET | Freq: Once | ORAL | Status: AC
Start: 2015-12-03 — End: 2015-12-03
  Administered 2015-12-03: 5 mg via ORAL
  Filled 2015-12-03: qty 1

## 2015-12-03 NOTE — Progress Notes (Signed)
Cardiac Rehab Phase I Note: RN in to walk with patient. Patient states he "had a bad night". Patient stated he continued to have surgical pain throughout the night that caused him to be restless and get little sleep. He request not to walk at this time although he understands it is in his best interest. He states he will walk later this morning or this afternoon with primary RN if cardiac rehab staff unavailable. Will follow up.

## 2015-12-03 NOTE — Progress Notes (Signed)
Patient ID: Matthew Vaughan, male   DOB: 05/07/1956, 60 y.o.   MRN: [id number] HeartMate 2 Rounding Note  Subjective:    S/P HMII LVAD May 02/2016.   S/p Ramp Echo 11/30/15. Speed 9000 => 9600 CXR 11/30/15 with small loculated left hydropneumothorax. Cannula and pump position appeared stable.  On warfarin per pharmacy. INR 1.99.  LDH stable 265  Feels good. Walking halls. No SOB.   LVAD INTERROGATION:  HeartMate II LVAD:  Flow 4.4 liters/min, speed 9600, power 6.0, PI 3.7.  Occasional  PI events  Objective:    Vital Signs:   Temp:  [98.5 F (36.9 C)] 98.5 F (36.9 C) (05/19 2124) Pulse Rate:  [96] 96 (05/19 2124) BP: (78)/(0) 78/0 mmHg (05/20 1103) SpO2:  [96 %] 96 % (05/19 2124) Weight:  [69.809 kg (153 lb 14.4 oz)] 69.809 kg (153 lb 14.4 oz) (05/20 0610) Last BM Date: 11/30/15 Mean arterial Pressure 70-80s  Intake/Output:   Intake/Output Summary (Last 24 hours) at 12/03/15 1323 Last data filed at 12/03/15 1100  Gross per 24 hour  Intake    480 ml  Output   2775 ml  Net  -2295 ml     Physical Exam: General:  Well appearing HEENT: Normal Neck: supple. JVP not elevated. Carotids 2+ bilat; no bruits. No thyromegaly or nodule noted.  Cor: Mechanical heart sounds with LVAD hum present. Sternal dressing intact. No audible click Lungs: clear Abdomen: soft, non-tender, non-distended, no HSM. No bruits or masses. +BS   Driveline: C/D/I; securement device intact and driveline incorporated Extremities: no cyanosis, clubbing, rash, edema Neuro: Alert and oriented x3   Telemetry: NSR 90s  Labs: Basic Metabolic Panel:  Recent Labs Lab 11/27/15 0510 11/28/15 0536 11/29/15 0447 11/30/15 1115 12/01/15 0540 12/02/15 0425 12/03/15 0445  NA 133* 132* 134* 133* 130* 133* 131*  K 4.2 4.3 4.3 5.2* 4.2 4.5 4.4  CL 96* 96* 95* 93* 93* 95* 92*  CO2 GLUCOSE 111* 114* 118* 104* 128* 114* 113*  BUN CREATININE 0.99 0.99 0.85 1.02 1.04  1.03 1.01  CALCIUM 8.6* 8.4* 8.8* 9.3 8.9 9.3 9.0  MG 1.8 1.9  --   --   --   --   --     Liver Function Tests:  Recent Labs Lab 11/27/15 0510 11/28/15 0536 11/29/15 0447  AST 31 35 52*  ALT 25 27 35  ALKPHOS 96 133* 192*  BILITOT 3.5* 2.8* 2.8*  PROT 6.7 6.7 6.5  ALBUMIN 2.2* 2.3* 2.2*   No results for input(s): LIPASE, AMYLASE in the last 168 hours. No results for input(s): AMMONIA in the last 168 hours.  CBC:  Recent Labs Lab 11/27/15 0510 11/28/15 0536 11/29/15 0447 11/30/15 1115 12/01/15 0540 12/03/15 0445  WBC 8.0 9.1 8.0 10.4 8.9 7.1  NEUTROABS 5.1 5.4  --   --   --  3.8  HGB 9.5* 10.3* 9.3* 10.1* 9.7* 9.0*  HCT 27.8* 30.7* 28.6* 30.1* 30.0* 28.7*  MCV 98.9 99.0 98.6 99.3 97.7 98.6  PLT 284 359 381 457* 473* 447*    INR:  Recent Labs Lab 11/29/15 0447 11/30/15 0522 12/01/15 0540 12/02/15 0425 12/03/15 0445  INR 2.21* 2.07* 2.04* 1.98* 1.99*    Other results:    Imaging: No results found.   Medications:     Scheduled Medications: . aspirin  81 mg Oral Daily  . bisacodyl  10 mg Oral Daily  Or  . bisacodyl  10 mg Rectal Daily  . digoxin  0.125 mg Oral Daily  . docusate sodium  200 mg Oral Daily  . feeding supplement (ENSURE ENLIVE)  237 mL Oral BID BM  . fentaNYL  75 mcg Transdermal Q72H  . furosemide  40 mg Oral Daily  . lisinopril  5 mg Oral Daily  . pantoprazole  40 mg Oral Daily  . potassium chloride  20 mEq Oral Daily  . sodium chloride flush  10-40 mL Intracatheter Q12H  . sodium chloride flush  3 mL Intravenous Q12H  . warfarin  5 mg Oral ONCE-1800  . Warfarin - Pharmacist Dosing Inpatient   Does not apply q1800    Infusions: . sodium chloride Stopped (11/22/15 1500)  . sodium chloride 10 mL/hr at 11/26/15 0700    PRN Medications: hydrALAZINE, ondansetron (ZOFRAN) IV, oxyCODONE, sodium chloride flush, sodium chloride flush, traMADol   Assessment/Plan/Discussion   1. S/P HMII LVAD POD #5: Stable. He is now off  norepinephrine and milrinone.  MAP 80s-90.  HR now around 100, ST (goes up when in more pain). VAD parameters stable at 9600 rpm.  - INR 1.99  (Goal 2-2.5).  ASA 81.  Pharmacy dosing coumadin - Ramp echo 11/30/15, speed increased to 9600. - BP stable on lisinopril 5 daily.  2. Acute/chronic systolic CHF:  Cardiogenic shock. Nonischemic cardiomyopathy.  - Volume status stable on exam. Continue po lasix.   3. LV thrombus: Small thrombus on MRI.  On Warfarin, goal INR 2-2.5.  4. Anemia: Expected blood loss from surgery. Hemoglobin stable. Continue to follow.  5. Rhythm: He remains in sinu 6. Hyperbilirubinemia: Total bilirubin 9-10 range post-op, trended down  Suspect this is related to CHF.   - LDH slightly trending up, no evidence for pump thrombus. Continue to follow.  7. Disposition: Ongoing LVAD teaching. Discharge early next week.   Mother arrives today  Discharge planned for  Monday.   I reviewed the LVAD parameters from today, and compared the results to the patient's prior recorded data.  No programming changes were made.  The LVAD is functioning within specified parameters.  The patient performs LVAD self-test daily.  LVAD interrogation was negative for any significant power changes, alarms or PI events/speed drops.  LVAD equipment check completed and is in good working order.  Back-up equipment present.   LVAD education done on emergency procedures and precautions and reviewed exit site care.  Length of Stay: 28  Arvilla Meres, MD 12/03/2015, 1:23 PM  VAD Team --- VAD ISSUES ONLY--- Pager [phone] (7am - 7am)  Advanced Heart Failure Team  Pager [phone] (M-F; 7a - 4p)  Please contact [facility] for night-coverage after hours (4p -7a ) and weekends on [email or web address]

## 2015-12-03 NOTE — Progress Notes (Signed)
ANTICOAGULATION CONSULT NOTE - Follow Up Consult  Pharmacy Consult for Coumadin Indication: LV thrombus / LVAD  No Known Allergies  Patient Measurements: Height: 5\' 11"  (180.3 cm) Weight: 153 lb 14.4 oz (69.809 kg) IBW/kg (Calculated) : 75.3  Vital Signs: BP: 78/0 mmHg (05/20 1103)  Labs:  Recent Labs  12/01/15 0540 12/02/15 0425 12/03/15 0445  HGB 9.7*  --  9.0*  HCT 30.0*  --  28.7*  PLT 473*  --  447*  LABPROT 22.9* 22.4* 22.5*  INR 2.04* 1.98* 1.99*  CREATININE 1.04 1.03 1.01    Estimated Creatinine Clearance: 78.7 mL/min (by C-G formula based on Cr of 1.01).  Assessment: 60 yo M on coumadin PTA for hx LV thrombus, transitioned to IV heparin on admission pending cath. S/P cath 4/24, found to have NICM, resumed heparin as bridge to LVAD-S/P LVAD on 5/8. Coumadin resumed 5/9.   Today's INR is essentially at goal 1.99. CBC is stable and no bleeding noted. No CBC today. Noted plan for discharge on Monday.  Goal of Therapy:  INR 2-2.5 Monitor platelets by anticoagulation protocol: Yes   Plan:  - Coumadin 5mg  PO x 1 tonight - may be able to resume home dose tomorrow - Daily INR  Lysle Pearl, PharmD, BCPS Pager # [phone] 12/03/2015 12:59 PM

## 2015-12-03 NOTE — Progress Notes (Signed)
Patient ID: Matthew Vaughan, male   DOB: January 25, 1956, 60 y.o.   MRN: [id number] HeartMate 2 Rounding Note  Subjective:    No complaints. Walking well, eating well. No BM in a couple days. Would like some Miralax.  Reports some pain this am from pocket but better now.  LVAD INTERROGATION:  HeartMate II LVAD:  Flow 4.4 liters/min, speed 9600, power 6, PI 3.7.  No PI events.  Objective:    Vital Signs:   Temp:  [98.5 F (36.9 C)] 98.5 F (36.9 C) (05/19 2124) Pulse Rate:  [96] 96 (05/19 2124) BP: (78)/(0) 78/0 mmHg (05/20 1103) SpO2:  [96 %] 96 % (05/19 2124) Weight:  [69.809 kg (153 lb 14.4 oz)] 69.809 kg (153 lb 14.4 oz) (05/20 0610) Last BM Date: 11/30/15 Mean arterial Pressure 78  Intake/Output:   Intake/Output Summary (Last 24 hours) at 12/03/15 1226 Last data filed at 12/03/15 0728  Gross per 24 hour  Intake    240 ml  Output   2325 ml  Net  -2085 ml     Physical Exam: General:  Well appearing. No resp difficulty HEENT: normal Cor: Normal heart sounds with LVAD hum present. Lungs: clear Incisions healing well Abdomen: soft, nontender, nondistended. No hepatosplenomegaly. No bruits or masses. Good bowel sounds. Extremities: no cyanosis, clubbing, rash, edema Neuro: alert & orientedx3, cranial nerves grossly intact. moves all 4 extremities w/o difficulty. Affect pleasant  Telemetry: sinus 90's  Labs: Basic Metabolic Panel:  Recent Labs Lab 11/27/15 0510 11/28/15 0536 11/29/15 0447 11/30/15 1115 12/01/15 0540 12/02/15 0425 12/03/15 0445  NA 133* 132* 134* 133* 130* 133* 131*  K 4.2 4.3 4.3 5.2* 4.2 4.5 4.4  CL 96* 96* 95* 93* 93* 95* 92*  CO2 GLUCOSE 111* 114* 118* 104* 128* 114* 113*  BUN CREATININE 0.99 0.99 0.85 1.02 1.04 1.03 1.01  CALCIUM 8.6* 8.4* 8.8* 9.3 8.9 9.3 9.0  MG 1.8 1.9  --   --   --   --   --     Liver Function Tests:  Recent Labs Lab 11/27/15 0510 11/28/15 0536 11/29/15 0447  AST 31 35 52*   ALT 25 27 35  ALKPHOS 96 133* 192*  BILITOT 3.5* 2.8* 2.8*  PROT 6.7 6.7 6.5  ALBUMIN 2.2* 2.3* 2.2*   No results for input(s): LIPASE, AMYLASE in the last 168 hours. No results for input(s): AMMONIA in the last 168 hours.  CBC:  Recent Labs Lab 11/27/15 0510 11/28/15 0536 11/29/15 0447 11/30/15 1115 12/01/15 0540 12/03/15 0445  WBC 8.0 9.1 8.0 10.4 8.9 7.1  NEUTROABS 5.1 5.4  --   --   --  3.8  HGB 9.5* 10.3* 9.3* 10.1* 9.7* 9.0*  HCT 27.8* 30.7* 28.6* 30.1* 30.0* 28.7*  MCV 98.9 99.0 98.6 99.3 97.7 98.6  PLT 284 359 381 457* 473* 447*    INR:  Recent Labs Lab 11/29/15 0447 11/30/15 0522 12/01/15 0540 12/02/15 0425 12/03/15 0445  INR 2.21* 2.07* 2.04* 1.98* 1.99*    Other results:  EKG:   Imaging:  No results found.   Medications:     Scheduled Medications: . aspirin  81 mg Oral Daily  . bisacodyl  10 mg Oral Daily   Or  . bisacodyl  10 mg Rectal Daily  . digoxin  0.125 mg Oral Daily  . docusate sodium  200 mg Oral Daily  . feeding supplement (ENSURE ENLIVE)  237 mL Oral BID BM  . fentaNYL  75 mcg Transdermal Q72H  . furosemide  40 mg Oral Daily  . lisinopril  5 mg Oral Daily  . pantoprazole  40 mg Oral Daily  . potassium chloride  20 mEq Oral Daily  . sodium chloride flush  10-40 mL Intracatheter Q12H  . sodium chloride flush  3 mL Intravenous Q12H  . warfarin  2.5 mg Oral Once per day on Sun Tue Thu Sat  . warfarin  5 mg Oral Once per day on Mon Wed Fri  . Warfarin - Pharmacist Dosing Inpatient   Does not apply q1800     Infusions: . sodium chloride Stopped (11/22/15 1500)  . sodium chloride 10 mL/hr at 11/26/15 0700     PRN Medications:  hydrALAZINE, ondansetron (ZOFRAN) IV, oxyCODONE, sodium chloride flush, sodium chloride flush, traMADol   Assessment:  POD 12 S/P HeartMate II LVAD  1. Non-ischemic cardiomyopathy with acute on chronic systolic CHF and cardiogenic shock preop.  2. Expected postop acute blood loss anemia 3.  Small chronic laminated basilar anterior wall LV thrombus not removed 4. Therapeutic on coumadin 5. Making excellent progress with rehab. 6. Small loculated left hydropneumothorax on CXR.   Plan/Discussion:    Continues to do very well.  INR still a little low with goal 2-2.5. Pharmacy ordering coumadin.  Continue teaching, IS, ambulation. Mother supposed to be here today.  I reviewed the LVAD parameters from today, and compared the results to the patient's prior recorded data.  No programming changes were made.  The LVAD is functioning within specified parameters.  The patient performs LVAD self-test daily.  LVAD interrogation was negative for any significant power changes, alarms or PI events/speed drops.  LVAD equipment check completed and is in good working order.  Back-up equipment present.   LVAD education done on emergency procedures and precautions and reviewed exit site care.  Length of Stay: [address]  Payton Doughty [facility] 12/03/2015, 12:26 PM

## 2015-12-04 LAB — BASIC METABOLIC PANEL
ANION GAP: 10 (ref 5–15)
BUN: 12 mg/dL (ref 6–20)
CHLORIDE: 93 mmol/L — AB (ref 101–111)
CO2: 29 mmol/L (ref 22–32)
Calcium: 9.4 mg/dL (ref 8.9–10.3)
Creatinine, Ser: 0.94 mg/dL (ref 0.61–1.24)
GFR calc Af Amer: >60 mL/min (ref >60)
GFR calc non Af Amer: >60 mL/min (ref >60)
GLUCOSE: 119 mg/dL — AB (ref 65–99)
POTASSIUM: 4.7 mmol/L (ref 3.5–5.1)
Sodium: 132 mmol/L — ABNORMAL LOW (ref 135–145)

## 2015-12-04 LAB — LACTATE DEHYDROGENASE: LDH: 266 U/L — ABNORMAL HIGH (ref 98–192)

## 2015-12-04 LAB — PROTIME-INR
INR: 1.57 — AB (ref 0.00–1.49)
Prothrombin Time: 18.8 seconds — ABNORMAL HIGH (ref 11.6–15.2)

## 2015-12-04 LAB — HEPARIN LEVEL (UNFRACTIONATED): Heparin Unfractionated: 0.51 IU/mL (ref 0.30–0.70)

## 2015-12-04 MED ORDER — WARFARIN SODIUM 7.5 MG PO TABS
7.5000 mg | ORAL_TABLET | Freq: Once | ORAL | Status: AC
  Administered 2015-12-04: 7.5 mg via ORAL
  Filled 2015-12-04: qty 1

## 2015-12-04 MED ORDER — HEPARIN (PORCINE) IN NACL 100-0.45 UNIT/ML-% IJ SOLN
1450.0000 [IU]/h | INTRAMUSCULAR | Status: DC
  Administered 2015-12-04 – 2015-12-05 (×2): 1450 [IU]/h via INTRAVENOUS
  Filled 2015-12-04 (×2): qty 250

## 2015-12-04 NOTE — Progress Notes (Addendum)
Patient ID: Matthew Vaughan, male   DOB: 04-23-1956, 60 y.o.   MRN: [id number] HeartMate 2 Rounding Note  Subjective:    S/P HMII LVAD May 02/2016.   S/p Ramp Echo 11/30/15. Speed 9000 => 9600 CXR 11/30/15 with small loculated left hydropneumothorax. Cannula and pump position appeared stable.  On warfarin per pharmacy. INR 1.99 -> 1.57  LDH stable 266  Feels good. Walking halls. No SOB. PI dropped to 1.9 last night. Now hovering in the low 3s. Weight down another pound. Now down 2 pounds from pre-implant weight.   LVAD INTERROGATION:  HeartMate II LVAD:  Flow 4.2 liters/min, speed 9600, power 5.5, PI 3.3.  Multiple  PI events  Objective:    Vital Signs:   Temp:  [98 F (36.7 C)-98.5 F (36.9 C)] 98 F (36.7 C) (05/21 0421) Pulse Rate:  [91-93] 93 (05/21 0421) Resp:  [16-18] 16 (05/21 0421) BP: (78-80)/(0) 78/0 mmHg (05/21 1022) SpO2:  [97 %-98 %] 97 % (05/21 0421) Weight:  [69.083 kg (152 lb 4.8 oz)] 69.083 kg (152 lb 4.8 oz) (05/21 0421) Last BM Date: 12/04/15 Mean arterial Pressure 70-80s  Intake/Output:   Intake/Output Summary (Last 24 hours) at 12/04/15 1035 Last data filed at 12/04/15 0847  Gross per 24 hour  Intake    600 ml  Output   1400 ml  Net   -800 ml     Physical Exam: General:  Well appearing HEENT: Normal Neck: supple. JVP not elevated. Carotids 2+ bilat; no bruits. No thyromegaly or nodule noted.  Cor: Mechanical heart sounds with LVAD hum present. Sternal dressing intact. No audible click Lungs: clear Abdomen: soft, non-tender, non-distended, no HSM. No bruits or masses. +BS   Driveline: C/D/I; securement device intact and driveline incorporated Extremities: no cyanosis, clubbing, rash, edema Neuro: Alert and oriented x3   Telemetry: NSR 90s  Labs: Basic Metabolic Panel:  Recent Labs Lab 11/28/15 0536  11/30/15 1115 12/01/15 0540 12/02/15 0425 12/03/15 0445 12/04/15 0546  NA 132*  < > 133* 130* 133* 131* 132*  K 4.3  < > 5.2* 4.2 4.5  4.4 4.7  CL 96*  < > 93* 93* 95* 92* 93*  CO2 28  < > 29 27 30 30 29   GLUCOSE 114*  < > 104* 128* 114* 113* 119*  BUN 9  < > 9 8 9 11 12   CREATININE 0.99  < > 1.02 1.04 1.03 1.01 0.94  CALCIUM 8.4*  < > 9.3 8.9 9.3 9.0 9.4  MG 1.9  --   --   --   --   --   --   < > = values in this interval not displayed.  Liver Function Tests:  Recent Labs Lab 11/28/15 0536 11/29/15 0447  AST 35 52*  ALT 27 35  ALKPHOS 133* 192*  BILITOT 2.8* 2.8*  PROT 6.7 6.5  ALBUMIN 2.3* 2.2*   No results for input(s): LIPASE, AMYLASE in the last 168 hours. No results for input(s): AMMONIA in the last 168 hours.  CBC:  Recent Labs Lab 11/28/15 0536 11/29/15 0447 11/30/15 1115 12/01/15 0540 12/03/15 0445  WBC 9.1 8.0 10.4 8.9 7.1  NEUTROABS 5.4  --   --   --  3.8  HGB 10.3* 9.3* 10.1* 9.7* 9.0*  HCT 30.7* 28.6* 30.1* 30.0* 28.7*  MCV 99.0 98.6 99.3 97.7 98.6  PLT 359 381 457* 473* 447*    INR:  Recent Labs Lab 11/30/15 0522 12/01/15 0540 12/02/15 0425 12/03/15 0445  12/04/15 0546  INR 2.07* 2.04* 1.98* 1.99* 1.57*    Other results:    Imaging: No results found.   Medications:     Scheduled Medications: . aspirin  81 mg Oral Daily  . bisacodyl  10 mg Oral Daily   Or  . bisacodyl  10 mg Rectal Daily  . digoxin  0.125 mg Oral Daily  . docusate sodium  200 mg Oral Daily  . feeding supplement (ENSURE ENLIVE)  237 mL Oral BID BM  . fentaNYL  75 mcg Transdermal Q72H  . furosemide  40 mg Oral Daily  . lisinopril  5 mg Oral Daily  . pantoprazole  40 mg Oral Daily  . potassium chloride  20 mEq Oral Daily  . sodium chloride flush  10-40 mL Intracatheter Q12H  . sodium chloride flush  3 mL Intravenous Q12H  . Warfarin - Pharmacist Dosing Inpatient   Does not apply q1800    Infusions: . sodium chloride Stopped (11/22/15 1500)  . sodium chloride 10 mL/hr at 11/26/15 0700    PRN Medications: hydrALAZINE, ondansetron (ZOFRAN) IV, oxyCODONE, sodium chloride flush, sodium  chloride flush, traMADol   Assessment/Plan/Discussion   1. S/P HMII LVAD :  --Doing well.  --Looks dry. Stop lasix. I decreased speed to 9400. --INR back down to 1.57. Start heparin. Need to increase coumadin/ (Goal 2-2.5).  ASA 81.  Pharmacy dosing coumadin - BP stable on lisinopril 5 daily.  2. Acute/chronic systolic CHF:  Cardiogenic shock. Nonischemic cardiomyopathy.  - Volume status low. Stop lasix. Stop digoxin. Encouraged po intake.  3. LV thrombus: Small thrombus on MRI.  On Warfarin, goal INR 2-2.5.  4. Anemia: Expected blood loss from surgery. Hemoglobin stable. Continue to follow.  5. Rhythm: He remains in sinus s 6. Hyperbilirubinemia: Total bilirubin 9-10 range post-op, trended down  Suspect this is related to CHF.   - LDH stable 7. Disposition: Ongoing LVAD teaching. Discharge when INR therapeutic and teaching of his mother is done. Hopefully tomorrow.   I reviewed the LVAD parameters from today, and compared the results to the patient's prior recorded data.  No programming changes were made.  The LVAD is functioning within specified parameters.  The patient performs LVAD self-test daily.  LVAD interrogation was negative for any significant power changes, alarms or PI events/speed drops.  LVAD equipment check completed and is in good working order.  Back-up equipment present.   LVAD education done on emergency procedures and precautions and reviewed exit site care.  Length of Stay: 29  Arvilla Meres, MD 12/04/2015, 10:35 AM  VAD Team --- VAD ISSUES ONLY--- Pager [phone] (7am - 7am)  Advanced Heart Failure Team  Pager [phone] (M-F; 7a - 4p)  Please contact [facility] for night-coverage after hours (4p -7a ) and weekends on [email or web address]

## 2015-12-04 NOTE — Progress Notes (Addendum)
ANTICOAGULATION CONSULT NOTE - Follow Up Consult  Pharmacy Consult for Coumadin + heparin Indication: LV thrombus / LVAD  No Known Allergies  Patient Measurements: Height: 5\' 11"  (180.3 cm) Weight: 152 lb 4.8 oz (69.083 kg) IBW/kg (Calculated) : 75.3  Vital Signs: Temp: 98 F (36.7 C) (05/21 0421) Temp Source: Oral (05/21 0421) BP: 78/0 mmHg (05/21 1022) Pulse Rate: 93 (05/21 0421)  Labs:  Recent Labs  12/02/15 0425 12/03/15 0445 12/04/15 0546  HGB  --  9.0*  --   HCT  --  28.7*  --   PLT  --  447*  --   LABPROT 22.4* 22.5* 18.8*  INR 1.98* 1.99* 1.57*  CREATININE 1.03 1.01 0.94    Estimated Creatinine Clearance: 83.7 mL/min (by C-G formula based on Cr of 0.94).  Assessment: 60 yo M on coumadin PTA for hx LV thrombus now s/p LVAC on 5/8 and continues on coumadin. INR dropped today to 1.57. No new CBC today. No bleeding noted. Adding IV heparin while INR is low.   Goal of Therapy:  INR 2-2.5 Monitor platelets by anticoagulation protocol: Yes   Plan:  - Restart heparin gtt 1450 units/hr (previously therapeutic on this rate) - Check an 8 hour heparin level - Give a boosted dose of warfarin 7.5mg  PO x 1 tonight - Daily INR, heparin level and CBC  Lysle Pearl, PharmD, BCPS Pager # [phone] 12/04/2015 10:50 AM   Addendum: Initial heparin level is therapeutic at 0.51.Continue heparin at 1450 units/hr and follow up AM labs.  Louie Casa, PharmD, BCPS 12/04/2015, 7:53 PM

## 2015-12-05 LAB — BASIC METABOLIC PANEL
Anion gap: 8 (ref 5–15)
BUN: 12 mg/dL (ref 6–20)
CHLORIDE: 95 mmol/L — AB (ref 101–111)
CO2: 28 mmol/L (ref 22–32)
Calcium: 9.3 mg/dL (ref 8.9–10.3)
Creatinine, Ser: 1.06 mg/dL (ref 0.61–1.24)
Glucose, Bld: 123 mg/dL — ABNORMAL HIGH (ref 65–99)
POTASSIUM: 4.6 mmol/L (ref 3.5–5.1)
SODIUM: 131 mmol/L — AB (ref 135–145)

## 2015-12-05 LAB — HEPARIN LEVEL (UNFRACTIONATED): HEPARIN UNFRACTIONATED: 0.68 [IU]/mL (ref 0.30–0.70)

## 2015-12-05 LAB — CBC
HEMATOCRIT: 31.8 % — AB (ref 39.0–52.0)
HEMOGLOBIN: 9.8 g/dL — AB (ref 13.0–17.0)
MCH: 31.2 pg (ref 26.0–34.0)
MCHC: 30.8 g/dL (ref 30.0–36.0)
MCV: 101.3 fL — ABNORMAL HIGH (ref 78.0–100.0)
Platelets: 471 10*3/uL — ABNORMAL HIGH (ref 150–400)
RBC: 3.14 MIL/uL — ABNORMAL LOW (ref 4.22–5.81)
RDW: 14.1 % (ref 11.5–15.5)
WBC: 9.6 10*3/uL (ref 4.0–10.5)

## 2015-12-05 LAB — PROTIME-INR
INR: 1.55 — AB (ref 0.00–1.49)
PROTHROMBIN TIME: 18.7 s — AB (ref 11.6–15.2)

## 2015-12-05 LAB — LACTATE DEHYDROGENASE: LDH: 258 U/L — ABNORMAL HIGH (ref 98–192)

## 2015-12-05 MED ORDER — WARFARIN SODIUM 7.5 MG PO TABS
7.5000 mg | ORAL_TABLET | Freq: Once | ORAL | Status: DC
  Filled 2015-12-05: qty 1

## 2015-12-05 MED ORDER — WARFARIN SODIUM 10 MG PO TABS
10.0000 mg | ORAL_TABLET | Freq: Once | ORAL | Status: AC
  Administered 2015-12-05: 10 mg via ORAL
  Filled 2015-12-05: qty 1

## 2015-12-05 MED ORDER — ENOXAPARIN SODIUM 40 MG/0.4ML ~~LOC~~ SOLN
40.0000 mg | Freq: Two times a day (BID) | SUBCUTANEOUS | Status: DC
  Administered 2015-12-05 – 2015-12-06 (×2): 40 mg via SUBCUTANEOUS
  Filled 2015-12-05 (×2): qty 0.4

## 2015-12-05 NOTE — Progress Notes (Addendum)
ANTICOAGULATION CONSULT NOTE - Follow Up Consult  Pharmacy Consult for Coumadin + lovenox Indication: LV thrombus / LVAD  No Known Allergies  Patient Measurements: Height: 5\' 11"  (180.3 cm) Weight: 154 lb 8 oz (70.081 kg) IBW/kg (Calculated) : 75.3  Vital Signs: Temp: 97.9 F (36.6 C) (05/22 0344) Temp Source: Oral (05/22 0344) Pulse Rate: 109 (05/22 0344)  Labs:  Recent Labs  12/03/15 0445 12/04/15 0546 12/04/15 1916 12/05/15 0240  HGB 9.0*  --   --  9.8*  HCT 28.7*  --   --  31.8*  PLT 447*  --   --  471*  LABPROT 22.5* 18.8*  --  18.7*  INR 1.99* 1.57*  --  1.55*  HEPARINUNFRC  --   --  0.51 0.68  CREATININE 1.01 0.94  --  1.06    Estimated Creatinine Clearance: 75.3 mL/min (by C-G formula based on Cr of 1.06).   . sodium chloride Stopped (11/22/15 1500)  . sodium chloride 10 mL/hr at 11/26/15 0700  . heparin 1,450 Units/hr (12/05/15 0735)    Assessment: 60 yo M on Coumadin PTA for hx LV thrombus now s/p LVAC on 5/8 and continues on coumadin. INR dropped today to 1.55 despite doses of Coumadin above home dose. Hgb/Pltc stable, no bleeding or complications noted.    PTA Coumadin dose 2.5 mg daily  Goal of Therapy:  INR 2-2.5 Monitor platelets by anticoagulation protocol: Yes   Plan:  - Decrease IV heparin to 1400 units/hr.   - Daily heparin level and INR. - Give a boosted dose of warfarin 7.5 mg PO x 1 tonight- recommend 7.5 x 1 tonight, then 5 mg daily at discharge until INR recheck (likely needs a few days a week of 2.5 mg) - Daily INR, heparin level and CBC -F/u plan for discharge today, awaiting input from case management on ability to obtain Lovenox for him.  Tad Moore, BCPS  Clinical Pharmacist Pager [phone]  12/05/2015 10:49 AM   ADDENDUM:  - Will switch to lovenox 0.5 mg/kg BID dosing per Dr. Donata Clay - After discussion with LVAD team will also give 10 mg Coumadin tonight to boost  - Possible d/c tomorrow   Cicero Duck K.  Bonnye Fava, PharmD, BCPS, CPP Clinical Pharmacist Pager: [phone] Phone: [phone] 12/05/2015 4:54 PM

## 2015-12-05 NOTE — Progress Notes (Signed)
CSW attempted to visit with patient at bedside although CSW unable to visit as patient was sleeping. CSW will follow up in the morning with patient. Lasandra Beech, LCSW [phone]

## 2015-12-05 NOTE — Progress Notes (Signed)
Patient ID: Matthew Vaughan, male   DOB: [birth year], 60 y.o.   MRN: [id number]    Patient ID: Matthew Vaughan, male   DOB: September 25, 1955, 60 y.o.   MRN: [id number] HeartMate 2 Rounding Note  Subjective:    S/P HMII LVAD May 02/2016.   S/p Ramp Echo 11/30/15. Speed 9000 => 9600.  Speed decreased to 9400 on 5/21 with decreased PI.  CXR 11/30/15 with small loculated left hydropneumothorax. Cannula and pump position appeared stable.  On warfarin per pharmacy. INR 1.99 -> 1.57 -> 1.55,  LDH stable 266 -> 258.   Feels good. Walking halls. No SOB.  PI higher today, no PI events.  Now off Lasix.   LVAD INTERROGATION:  HeartMate II LVAD:  Flow 4.7 liters/min, speed 9600, power 5.5, PI 5.6.  No PI events.   Objective:    Vital Signs:   Temp:  [97.5 F (36.4 C)-98.3 F (36.8 C)] 97.9 F (36.6 C) (05/22 0344) Pulse Rate:  [93-109] 109 (05/22 0344) Resp:  [16-18] 18 (05/22 0344) BP: (78)/(0) 78/0 mmHg (05/21 1022) SpO2:  [100 %] 100 % (05/22 0344) Weight:  [154 lb 8 oz (70.081 kg)] 154 lb 8 oz (70.081 kg) (05/22 0344) Last BM Date: 12/04/15 Mean arterial Pressure 70-80s  Intake/Output:   Intake/Output Summary (Last 24 hours) at 12/05/15 0749 Last data filed at 12/05/15 0000  Gross per 24 hour  Intake    480 ml  Output    450 ml  Net     30 ml     Physical Exam: General:  Well appearing HEENT: Normal Neck: supple. JVP not elevated. Carotids 2+ bilat; no bruits. No thyromegaly or nodule noted.  Cor: Mechanical heart sounds with LVAD hum present. Sternal dressing intact. No audible click Lungs: clear Abdomen: soft, non-tender, non-distended, no HSM. No bruits or masses. +BS   Driveline: C/D/I; securement device intact and driveline incorporated Extremities: no cyanosis, clubbing, rash, edema Neuro: Alert and oriented x3   Telemetry: NSR 90s  Labs: Basic Metabolic Panel:  Recent Labs Lab 12/01/15 0540 12/02/15 0425 12/03/15 0445 12/04/15 0546 12/05/15 0240  NA 130* 133* 131*  132* 131*  K 4.2 4.5 4.4 4.7 4.6  CL 93* 95* 92* 93* 95*  CO2 27 30 30 29 28   GLUCOSE 128* 114* 113* 119* 123*  BUN 8 9 11 12 12   CREATININE 1.04 1.03 1.01 0.94 1.06  CALCIUM 8.9 9.3 9.0 9.4 9.3    Liver Function Tests:  Recent Labs Lab 11/29/15 0447  AST 52*  ALT 35  ALKPHOS 192*  BILITOT 2.8*  PROT 6.5  ALBUMIN 2.2*   No results for input(s): LIPASE, AMYLASE in the last 168 hours. No results for input(s): AMMONIA in the last 168 hours.  CBC:  Recent Labs Lab 11/29/15 0447 11/30/15 1115 12/01/15 0540 12/03/15 0445 12/05/15 0240  WBC 8.0 10.4 8.9 7.1 9.6  NEUTROABS  --   --   --  3.8  --   HGB 9.3* 10.1* 9.7* 9.0* 9.8*  HCT 28.6* 30.1* 30.0* 28.7* 31.8*  MCV 98.6 99.3 97.7 98.6 101.3*  PLT 381 457* 473* 447* 471*    INR:  Recent Labs Lab 12/01/15 0540 12/02/15 0425 12/03/15 0445 12/04/15 0546 12/05/15 0240  INR 2.04* 1.98* 1.99* 1.57* 1.55*    Other results:    Imaging: No results found.   Medications:     Scheduled Medications: . aspirin  81 mg Oral Daily  . bisacodyl  10 mg Oral Daily   Or  .  bisacodyl  10 mg Rectal Daily  . docusate sodium  200 mg Oral Daily  . feeding supplement (ENSURE ENLIVE)  237 mL Oral BID BM  . fentaNYL  75 mcg Transdermal Q72H  . lisinopril  5 mg Oral Daily  . pantoprazole  40 mg Oral Daily  . potassium chloride  20 mEq Oral Daily  . sodium chloride flush  10-40 mL Intracatheter Q12H  . sodium chloride flush  3 mL Intravenous Q12H  . Warfarin - Pharmacist Dosing Inpatient   Does not apply q1800    Infusions: . sodium chloride Stopped (11/22/15 1500)  . sodium chloride 10 mL/hr at 11/26/15 0700  . heparin 1,450 Units/hr (12/05/15 0317)    PRN Medications: hydrALAZINE, ondansetron (ZOFRAN) IV, oxyCODONE, sodium chloride flush, sodium chloride flush, traMADol   Assessment/Plan/Discussion   1. S/P HMII LVAD: Doing well. Now off Lasix with higher PI.  Speed decreased to 9400.  - INR 1.55 today,  heparin was restarted and he is on warfarin.  Will see if we can get him Lovenox injections for discharge.  - BP stable on lisinopril 5 daily.  2. Acute/chronic systolic CHF:  Cardiogenic shock. Nonischemic cardiomyopathy.  - He is now off Lasix and digoxin. Volume looks ok.  3. LV thrombus: Small thrombus on MRI.  On Warfarin, goal INR 2-2.5.  4. Anemia: Expected blood loss from surgery. Hemoglobin stable. Continue to follow.  5. Rhythm: He remains in sinus rhythm. 6. Hyperbilirubinemia: Total bilirubin 9-10 range post-op, trended down  Suspect this is related to CHF.   - LDH stable 7. Disposition: Ongoing LVAD teaching.  If we can get him Lovenox injections for home, mother should be able to inject and hopefully can go home today.  Home meds: warfarin, Lovenox injections until INR > 2, ASA 81, lisinopril 5 daily, Protonix 40 daily.  I reviewed the LVAD parameters from today, and compared the results to the patient's prior recorded data.  No programming changes were made.  The LVAD is functioning within specified parameters.  The patient performs LVAD self-test daily.  LVAD interrogation was negative for any significant power changes, alarms or PI events/speed drops.  LVAD equipment check completed and is in good working order.  Back-up equipment present.   LVAD education done on emergency procedures and precautions and reviewed exit site care.  Length of Stay: 89  [name], MD 12/05/2015, 7:49 AM  VAD Team --- VAD ISSUES ONLY--- Pager [phone] (7am - 7am)  Advanced Heart Failure Team  Pager [phone] (M-F; 7a - 4p)  Please contact [facility] for night-coverage after hours (4p -7a ) and weekends on [email or web address]

## 2015-12-05 NOTE — Care Management Note (Signed)
Case Management Note Donn Pierini RN, BSN Unit 2W-Case Manager [phone]  Patient Details  Name: Matthew Vaughan MRN: [id number] Date of Birth: 1955-08-03  Subjective/Objective:      Pt s/p LVAD placement              Action/Plan: PTA pt lived at home- plan for brother and mother to assist pt with LVAD drsg changes on discharge- referral for Lovenox needs on Discharge- pt is eligible for MATCH and can be assisted for lovenox injection 70 mg q 12 x 5 days (use 80 mg syringes # 10), - spoke with pt/mother/brother at bedside- per conversation pt states he is still thinking about injections- mother reports that she is willing to do them for pt- asked which pharmacy they would prefer to use- brother- stated [facility] on [address]- call made to that pharmacy and they do have 6 syringes in stock and can order more to be in within 24 hr.  Pt states that he would like a rollator for discharge. -MD please order DME for pt- pt had previously chosen [facility] for any HH needs that may be needed at time of discharge- CM will provide pt with [facility] letter for medication assistance at time of discharge.   Expected Discharge Date:                  Expected Discharge Plan:  Home w Home Health Services  In-House Referral:     Discharge planning Services  CM Consult, MATCH Program, Medication Assistance  Post Acute Care Choice:  Durable Medical Equipment, Home Health Choice offered to:     DME Arranged:  IV pump/equipment DME Agency:  [facility]  HH Arranged:    [facility] Agency:  [facility]  Status of Service:  In process, will continue to follow  Medicare Important Message Given:    Date Medicare IM Given:    Medicare IM give by:    Date Additional Medicare IM Given:    Additional Medicare Important Message give by:     If discussed at Long Length of Stay Meetings, dates discussed:    Additional Comments:  Darrold Span, RN 12/05/2015, 2:34 PM

## 2015-12-05 NOTE — Progress Notes (Signed)
CARDIAC REHAB PHASE I   Pt family at bedside, awaiting LVAD education. Pt declines ambulation at this time. Cardiac surgery discharge education completed with pt and family in anticipation of possible discharge today. Reviewed IS, sternal precautions, activity progression, exercise, CHF booklet and zone tool, daily weights, sodium and fluid restrictions, heart healthy diet, and phase 2 cardiac rehab. Pt verbalized understanding. Pt agrees to phase 2 cardiac rehab referral, will send to [facility] per pt request. PT to see pt today as well. If pt does not discharge today, will follow-up this afternoon, as schedule permits, for ambulation. Pt sitting on edge of bed, call bell within reach. Will follow.   [id number] Joylene Grapes, RN, BSN 12/05/2015 9:16 AM

## 2015-12-06 LAB — BASIC METABOLIC PANEL
ANION GAP: 9 (ref 5–15)
BUN: 14 mg/dL (ref 6–20)
CALCIUM: 9.2 mg/dL (ref 8.9–10.3)
CHLORIDE: 96 mmol/L — AB (ref 101–111)
CO2: 27 mmol/L (ref 22–32)
Creatinine, Ser: 1.05 mg/dL (ref 0.61–1.24)
GFR calc Af Amer: >60 mL/min (ref >60)
GFR calc non Af Amer: >60 mL/min (ref >60)
GLUCOSE: 110 mg/dL — AB (ref 65–99)
Potassium: 4.3 mmol/L (ref 3.5–5.1)
Sodium: 132 mmol/L — ABNORMAL LOW (ref 135–145)

## 2015-12-06 LAB — CBC
HEMATOCRIT: 28.3 % — AB (ref 39.0–52.0)
HEMATOCRIT: 30 % — AB (ref 39.0–52.0)
HEMOGLOBIN: 9.5 g/dL — AB (ref 13.0–17.0)
Hemoglobin: 8.8 g/dL — ABNORMAL LOW (ref 13.0–17.0)
MCH: 31.4 pg (ref 26.0–34.0)
MCH: 31.9 pg (ref 26.0–34.0)
MCHC: 31.1 g/dL (ref 30.0–36.0)
MCHC: 31.7 g/dL (ref 30.0–36.0)
MCV: 101.1 fL — AB (ref 78.0–100.0)
MCV: 100.7 fL — ABNORMAL HIGH (ref 78.0–100.0)
Platelets: 402 10*3/uL — ABNORMAL HIGH (ref 150–400)
Platelets: 441 10*3/uL — ABNORMAL HIGH (ref 150–400)
RBC: 2.8 MIL/uL — ABNORMAL LOW (ref 4.22–5.81)
RBC: 2.98 MIL/uL — ABNORMAL LOW (ref 4.22–5.81)
RDW: 14 % (ref 11.5–15.5)
RDW: 13.9 % (ref 11.5–15.5)
WBC: 8 10*3/uL (ref 4.0–10.5)
WBC: 9.2 10*3/uL (ref 4.0–10.5)

## 2015-12-06 LAB — PROTIME-INR
INR: 1.72 — ABNORMAL HIGH (ref 0.00–1.49)
Prothrombin Time: 20.2 seconds — ABNORMAL HIGH (ref 11.6–15.2)

## 2015-12-06 LAB — LACTATE DEHYDROGENASE: LDH: 235 U/L — ABNORMAL HIGH (ref 98–192)

## 2015-12-06 MED ORDER — WARFARIN SODIUM 5 MG PO TABS: 5.0000 mg | ORAL_TABLET | Freq: Every day | ORAL | Status: DC

## 2015-12-06 MED ORDER — PANTOPRAZOLE SODIUM 40 MG PO TBEC: 40.0000 mg | DELAYED_RELEASE_TABLET | Freq: Every day | ORAL | Status: DC

## 2015-12-06 MED ORDER — OXYCODONE HCL 5 MG PO TABS: 5.0000 mg | ORAL_TABLET | Freq: Four times a day (QID) | ORAL | Status: DC | PRN

## 2015-12-06 MED ORDER — ASPIRIN 81 MG PO CHEW: 81.0000 mg | CHEWABLE_TABLET | Freq: Every day | ORAL | Status: DC

## 2015-12-06 MED ORDER — LISINOPRIL 5 MG PO TABS: 5.0000 mg | ORAL_TABLET | Freq: Every day | ORAL | Status: DC

## 2015-12-06 MED ORDER — ENOXAPARIN SODIUM 40 MG/0.4ML ~~LOC~~ SOLN
40.0000 mg | Freq: Once | SUBCUTANEOUS | Status: AC
  Administered 2015-12-06: 40 mg via SUBCUTANEOUS
  Filled 2015-12-06: qty 0.4

## 2015-12-06 MED ORDER — WARFARIN SODIUM 7.5 MG PO TABS
7.5000 mg | ORAL_TABLET | Freq: Once | ORAL | Status: AC
  Administered 2015-12-06: 7.5 mg via ORAL
  Filled 2015-12-06: qty 1

## 2015-12-06 MED FILL — WARFARIN SODIUM 5 MG TABLET: 5 | 45 days supply | Qty: 45 | Fill #0

## 2015-12-06 MED FILL — PANTOPRAZOLE SOD DR 40 MG T: 40 | 30 days supply | Qty: 30 | Fill #0

## 2015-12-06 MED FILL — LISINOPRIL 5 MG TABLET: 5 | 30 days supply | Qty: 30 | Fill #0

## 2015-12-06 NOTE — Progress Notes (Signed)
Occupational Therapy Treatment Patient Details Name: Matthew Vaughan MRN: [id number] DOB: 1956/04/26 Today's Date: 12/06/2015    History of present illness 60 y.o. male. Residence is in [place] but was visiting family in [place] when he came to hospital. Hx non-ischemic CM. EF ~ 20% historically. HTN. Chronic Coumadin for hx LV thrombus. S/p cath in 2015. Presents with Non-ischemic cardiomyopathy with acute on chronic systolic CHF and cardiogenic shock. Patient is now s/p LVAD implantation.   OT comments  Pt eager for d/c. Knowledgeable in sternal precautions related to IADL and gradual increasing activity. Pt moving about his room and bathroom at a modified independent level.  Follow Up Recommendations  No OT follow up;Supervision/Assistance - 24 hour    Equipment Recommendations  3 in 1 bedside comode    Recommendations for Other Services      Precautions / Restrictions Precautions Precautions: Fall;Sternal Precaution Comments: generalizing sternal precautions in ADL and mobility, educated in sternal precautions during IADL Restrictions Other Position/Activity Restrictions: sternal precautions       Mobility Bed Mobility Overal bed mobility: Independent                Transfers Overall transfer level: Modified independent Equipment used: None                  Balance                                   ADL       Grooming: Modified independent;Standing;Wash/dry hands                   Toilet Transfer: Modified Independent;Ambulation   Toileting- Clothing Manipulation and Hygiene: Modified independent;Sit to/from stand       Functional mobility during ADLs: Modified independent (did not use AD in room) General ADL Comments: Pt to discharge today. Mother and brother in room. Educated pt in gradually increasing activity. Encouraged pt to participate in IADL (simple meal prep, folding laundry). Reminded pt not to carry heavy items  including groceries, laundry baskets, wet items from washer to dryer and avoid vacumming/mopping. Pt with plans to build endurance walking outside. Educated pt to avoid hot weather. Verbalized understanding of all.       Vision                     Perception     Praxis      Cognition   Behavior During Therapy: WFL for tasks assessed/performed Overall Cognitive Status: Within Functional Limits for tasks assessed                       Extremity/Trunk Assessment               Exercises     Shoulder Instructions       General Comments      Pertinent Vitals/ Pain       Pain Assessment: Faces Faces Pain Scale: No hurt  Home Living                                          Prior Functioning/Environment              Frequency       Progress Toward Goals  OT Goals(current goals can now be found in  the care plan section)  Progress towards OT goals: Progressing toward goals  Acute Rehab OT Goals Time For Goal Achievement: 12/07/15  Plan Discharge plan remains appropriate    Co-evaluation                 End of Session     Activity Tolerance Patient tolerated treatment well   Patient Left in bed;with call bell/phone within reach;with family/visitor present (EOB)   Nurse Communication          Time: 1478-2956 OT Time Calculation (min): 14 min  Charges: OT General Charges $OT Visit: 1 Procedure OT Treatments $Self Care/Home Management : 8-22 mins  Evern Bio 12/06/2015, 1:06 PM  [phone]

## 2015-12-06 NOTE — Progress Notes (Signed)
Patient ID: Matthew Vaughan, male   DOB: May 09, 1956, 60 y.o.   MRN: [id number] HeartMate 2 Rounding Note  Subjective:    No complaints. Walking, eating, bowels working.  He could not give himself Lovenox injection last night.  INR up to 1.72 from 1.55 yesterday after coumadin 10 mg last night   LVAD INTERROGATION:  HeartMate II LVAD:  Flow 4.8 liters/min, speed 9600, power 5.7, PI 4.7.    Objective:    Vital Signs:   Temp:  [98.1 F (36.7 C)-98.4 F (36.9 C)] 98.1 F (36.7 C) (05/23 0356) Pulse Rate:  [97-103] 97 (05/23 0356) Resp:  [18] 18 (05/23 0356) SpO2:  [98 %-100 %] 100 % (05/23 0356) Weight:  [69.3 kg (152 lb 12.5 oz)] 69.3 kg (152 lb 12.5 oz) (05/23 0356) Last BM Date: 12/04/15 Mean arterial Pressure 70's-80's  Intake/Output:   Intake/Output Summary (Last 24 hours) at 12/06/15 1027 Last data filed at 12/06/15 0703  Gross per 24 hour  Intake    600 ml  Output   1475 ml  Net   -875 ml     Physical Exam: General:  Well appearing. No resp difficulty HEENT: normal Cor: normal heart sounds with LVAD hum present. Chest incision healing well Lungs: clear Abdomen: soft, nontender, nondistended. No hepatosplenomegaly. No bruits or masses. Good bowel sounds. Abdominal incision healing well Extremities: no cyanosis, clubbing, rash, edema Neuro: alert & orientedx3, cranial nerves grossly intact. moves all 4 extremities w/o difficulty. Affect pleasant  Telemetry: sinus 90's  Labs: Basic Metabolic Panel:  Recent Labs Lab 12/02/15 0425 12/03/15 0445 12/04/15 0546 12/05/15 0240 12/06/15 0550  NA 133* 131* 132* 131* 132*  K 4.5 4.4 4.7 4.6 4.3  CL 95* 92* 93* 95* 96*  CO2 GLUCOSE 114* 113* 119* 123* 110*  BUN CREATININE 1.03 1.01 0.94 1.06 1.05  CALCIUM 9.3 9.0 9.4 9.3 9.2    Liver Function Tests: No results for input(s): AST, ALT, ALKPHOS, BILITOT, PROT, ALBUMIN in the last 168 hours. No results for input(s): LIPASE,  AMYLASE in the last 168 hours. No results for input(s): AMMONIA in the last 168 hours.  CBC:  Recent Labs Lab 11/30/15 1115 12/01/15 0540 12/03/15 0445 12/05/15 0240 12/06/15 0550  WBC 10.4 8.9 7.1 9.6 8.0  NEUTROABS  --   --  3.8  --   --   HGB 10.1* 9.7* 9.0* 9.8* 8.8*  HCT 30.1* 30.0* 28.7* 31.8* 28.3*  MCV 99.3 97.7 98.6 101.3* 101.1*  PLT 457* 473* 447* 471* 402*    INR:  Recent Labs Lab 12/02/15 0425 12/03/15 0445 12/04/15 0546 12/05/15 0240 12/06/15 0550  INR 1.98* 1.99* 1.57* 1.55* 1.72*    Other results:  EKG:   Imaging:  No results found.   Medications:     Scheduled Medications: . aspirin  81 mg Oral Daily  . bisacodyl  10 mg Oral Daily   Or  . bisacodyl  10 mg Rectal Daily  . docusate sodium  200 mg Oral Daily  . enoxaparin (LOVENOX) injection  40 mg Subcutaneous Q12H  . feeding supplement (ENSURE ENLIVE)  237 mL Oral BID BM  . fentaNYL  75 mcg Transdermal Q72H  . lisinopril  5 mg Oral Daily  . pantoprazole  40 mg Oral Daily  . sodium chloride flush  10-40 mL Intracatheter Q12H  . sodium chloride flush  3 mL Intravenous Q12H  . Warfarin - Pharmacist Dosing Inpatient  Does not apply q1800     Infusions: . sodium chloride Stopped (11/22/15 1500)  . sodium chloride 10 mL/hr at 11/26/15 0700     PRN Medications:  hydrALAZINE, ondansetron (ZOFRAN) IV, oxyCODONE, sodium chloride flush, sodium chloride flush, traMADol   Assessment/Plan/Discussion:    POD 15 S/P HeartMate II LVAD  1. Non-ischemic cardiomyopathy with acute on chronic systolic CHF and cardiogenic shock preop.  2. Expected postop acute blood loss anemia 3. Small chronic laminated basilar anterior wall LV thrombus not removed 4. Sub-therapeutic on coumadin but INR increasing. On Lovenox 0.5 mg/kg bid until INR 2. His INR was therapeutic and then dropped down probably because coumadin dose tapered back at the same time that his liver function was recovering after preop  shock. 5. Making excellent progress with rehab. 6. Small loculated left hydropneumothorax on CXR. 7. Teaching done.  He can't give himself Lovenox and his mother and brother can't do it so he is probably just going to have to stay in the hospital until INR 2.0. The risk of being sub-therapeutic and not getting the Lovenox is too high.    I reviewed the LVAD parameters from today, and compared the results to the patient's prior recorded data.  No programming changes were made.  The LVAD is functioning within specified parameters.  The patient performs LVAD self-test daily.  LVAD interrogation was negative for any significant power changes, alarms or PI events/speed drops.  LVAD equipment check completed and is in good working order.  Back-up equipment present.   LVAD education done on emergency procedures and precautions and reviewed exit site care.  Length of Stay: [address]  Payton Doughty [facility] 12/06/2015, 10:27 AM

## 2015-12-06 NOTE — Care Management Note (Signed)
Case Management Note Donn Pierini RN, BSN Unit 2W-Case Manager [phone]  Patient Details  Name: Lysle Smolinski MRN: [id number] Date of Birth: 1956/03/13  Subjective/Objective:      Pt s/p LVAD placement              Action/Plan: PTA pt lived at home- plan for brother and mother to assist pt with LVAD drsg changes on discharge- referral for Lovenox needs on Discharge- pt is eligible for MATCH and can be assisted for lovenox injection 70 mg q 12 x 5 days (use 80 mg syringes # 10), - spoke with pt/mother/brother at bedside- per conversation pt states he is still thinking about injections- mother reports that she is willing to do them for pt- asked which pharmacy they would prefer to use- brother- stated [facility] on [address]- call made to that pharmacy and they do have 6 syringes in stock and can order more to be in within 24 hr.  Pt states that he would like a rollator for discharge. -MD please order DME for pt- pt had previously chosen [facility] for any HH needs that may be needed at time of discharge- CM will provide pt with [facility] letter for medication assistance at time of discharge.   Expected Discharge Date:     12/06/15             Expected Discharge Plan:  Home w Home Health Services  In-House Referral:     Discharge planning Services  CM Consult, MATCH Program, Medication Assistance  Post Acute Care Choice:  Durable Medical Equipment, Home Health Choice offered to:  Patient  DME Arranged:  Walker rolling with seat DME Agency:  [facility]  HH Arranged:  RN, Disease Management HH Agency:  [facility]  Status of Service:  Completed, signed off  Medicare Important Message Given:    Date Medicare IM Given:    Medicare IM give by:    Date Additional Medicare IM Given:    Additional Medicare Important Message give by:     If discussed at Long Length of Stay Meetings, dates discussed:  12/06/15  Discharge Disposition: home/home health   Additional  Comments:  12/06/15- 1425- Donn Pierini RN, BSN - pt for d/c home today- per PA with HF team pt will not need to go home on Lovenox- will need [facility] for INR check on 5/26- orders have been placed for HH-HF management- spoke with Darl Pikes with [facility] who will f/u with pt for [facility] needs per orders. Pt also needs rollator for home- order has been placed- Call made to [facility] with [facility] for DME need- rollator to be delivered to room prior to discharge. Spoke with Kirt Boys with LVAD team to clarify medication assistance needs- per Kirt Boys pt will be assisted with medications through the HF clinic- with the exception of pain meds. Pt will need to go to [facility] to pick medications up at no cost. CM will not need to assist pt with the [facility] letter on this admission/discharge.  Pt to d/c home with his mother and brother.   Darrold Span, RN 12/06/2015, 2:26 PM

## 2015-12-06 NOTE — Progress Notes (Signed)
Discharge VAD teaching completed with patient and caregivers: Tye Maryland (brother) and Tawanna Sat (mother).    The patient's family has completed a home inspection checklist, this has been reviewed and no unsafe conditions have been identified.  Family has ensured two dedicated grounded, 3-prong outlets with clearly labeled circuit breaker has been established in the bedroom for power module and Magazine features editor.  Both patient and caregivers have been trained on the following:  -  Overview of major warnings and cautions   -  HM II LVAD overview of system operation   -  Overview of system components (features and functions)  -  Changing power source  -  Overview of alerts and alarms  -  How to identify an emergency  -  How to handle an emergency including when the pump is running and when the pump has stopped   -  Changing system controller  -  Maintain emergency contact list  The patient and caregivers have successfully demonstrated:   -  Changing power source (from batteries to power module, power module to  batteries, and replacing batteries)  -  Perform system controller self test   -  Perform power module self test   -  Check and charge batteries   -  Change system controller  A daily flow sheet with patient  weight, temperature,  flow, speed, power, and PI, along with daily self checks on system controller and power module have been performed by patient and caregiver(s) during hospitalization and will also be done daily at home.   The brother has been trained on percutaneous lead exit site care and dressing changes and has performed dressing changes during patient's hospitalization under nursing supervision. The importance of lead immobilization has been stressed to patient and caregivers using the attachment device. The caregiver has successfully demonstrated the following:  -  Cleansing  -  Dressing  -  Immobilizing lead  The following routine activities and maintenance have  been reviewed with patient and caregivers and all verbalize understanding:   -  Stressed importance of never disconnecting power from both controller power leads at the same time, and never disconnecting both batteries at the same time, or the pump will stop  -  Plug the power module (PM) or the universal Magazine features editor (UBC) into properly grounded (3 prong) outlets dedicated to PM use. Do NOT use adapter (cheater plug) for ungrounded outlets or multiple portable socket outlets (power strips)  -  Do not connect the PM or UBC to an outlet controlled by wall switch or the device may not work  -  The PM's internal battery (that provides limited backup power to the LVAD in the event of AC mains power failure) remains charged as long as the PM is connected to [facility] or DC power  -  The PM's internal battery provides approximately 30 minutes of emergency backup power for the LVAD  -  Transfer from PM to batteries during [facility] mains power failure. The PM has internal backup battery that will power the pump while you transfer to batteries  -  Keep a backup system controller, charged batteries, battery clips, and             flashlight near you during sleep in case of electrical power outage  -  Clean battery, battery clip, and universal battery charger contacts weekly  -  Visually inspect percutaneous lead daily  -  Check cables and connectors when changing power source   -  Rotate batteries; keep all eight batteries charged  -  Always have backup system controller, battery clips, fully charged batteries, and spare fully charged batteries when traveling  -  Re-calibrate batteries every 70 uses; monitor battery life of 36 months or 360 uses; replace batteries at end of battery life  Identified the following changes in activities of daily living with pump:   -  No driving for at least six weeks and then only if doctor gives    permission to do so  -  No tub baths while pump implanted, and shower only if doctor  gives    permission  -  No swimming or submersion in water while implanted with pump  -  Keep all VAD equipment away from water or moisture  -  Keep all VAD connections clean and dry  -  No contact sports or engage in jumping activities  -  Avoid strong static electricity (touching TV/computer screens, vacuuming)  -  Never have an MRI while implanted with the pump  -  Never leave or store batteries in extremely hot or cold places (such as   trunk of your car), or the battery life will be shortened  -  Call the doctor or hospital contact person if any change in how the pump            sounds, feels, or works  -  Plan to sleep only when connected to the power module.  -  Keep a backup system controller, charged batteries, battery clips, and             flashlight near you during sleep in case of electrical power outage  -  Do not sleep on your stomach  -  Talk with doctor before any long distance travel plans  -  Patient will need antibiotics prior to any dental procedure; instructed to contact VAD coordinator before any dental procedures (including routine cleaning)  Discharge binder given to patient and include the following:   -  List of emergency contacts  -  Wallet cards  -  HM II Luggage tags  -  Guide to Percutaneous Lead Care and Equipment Maintenance  -  HM II Alarms for Patients and their Caregivers  -  HM II Patient Handbook  -  HM II Patient Education Program DVD  -  HM II Power Module Alarms, Care, Maintenance  -  Daily diary sheets  -  Care of the Percutaneous Lead  Discharge equipment includes:   -  Two system controllers with preset: Fixed speed of 9400 RPM Low speed limit 8800 RPM  -  One power module (PM) with 20' patient cable  -  One universal Magazine features editor (UBC)  -  Eight fully charged batteries  -  Four battery clips  -  One travel case  -  One Oncologist  Following notification process completed with:  -  [facility]  -  [employer]    Discussed frequency and importance of INR checks; emphasized importance of maintaining INR goal to prevent clotting and or bleeding issues with pump.  Patient will have INR managed by [facility] VAD team; current INR goal is 2.0 - 2.25   Pt not eligible for home INR machine due to lack of insurance at this time.   When to call VAD Coordinator  . Bleeding: if you have any nose bleeds, notice any blood in your urine or stool, or you have any black tarry stools.    Marland Kitchen  Dark colored urine: such as the color of tea, coffee, or cola.  . Dizziness  . Unexplained increase in shortness of breath  . Weight gain of more than 3 pounds in 24 hours or 5 pounds in a week  . Changes in driveline site: increase in redness, tenderness, drainage, or odor  . Any trauma to driveline: such as controller drop, tug on driveline, etc  . Temperature of 100 F or greater or any chills  . Experience any problems with your batteries maintaining their charge or unable to calibrate  . Notice that your pump power suddenly increases to 10 watts or greater  . Notice yellow wrench visual alarms on your power module or system controller  . Suddenly can't perform a system controller self test  . Any red heart alarms or yellow wrench symbols   If you have any of the above problems contact your VAD Coordinator at [phone]    The patient has completed a proficiency test for the HM II and all questions have been answered. The pt and family have been instructed to call if any questions, problems, or concerns arise. Pt and caregiver successfully paged VAD coordinator using VAD pager emergency number and have been instructed to use this number only for emergencies. Patient and caregivers asked appropriate questions, had good interaction with VAD coordinator, and verbalized understanding of above instructions.

## 2015-12-06 NOTE — Progress Notes (Signed)
ANTICOAGULATION CONSULT NOTE - Follow Up Consult  Pharmacy Consult for Coumadin + lovenox Indication: LV thrombus / LVAD  No Known Allergies  Patient Measurements: Height: 5\' 11"  (180.3 cm) Weight: 152 lb 12.5 oz (69.3 kg) IBW/kg (Calculated) : 75.3  Vital Signs: Temp: 98.1 F (36.7 C) (05/23 0356) Temp Source: Oral (05/23 0356) Pulse Rate: 97 (05/23 0356)  Labs:  Recent Labs  12/04/15 0546 12/04/15 1916 12/05/15 0240 12/06/15 0550  HGB  --   --  9.8* 8.8*  HCT  --   --  31.8* 28.3*  PLT  --   --  471* 402*  LABPROT 18.8*  --  18.7* 20.2*  INR 1.57*  --  1.55* 1.72*  HEPARINUNFRC  --  0.51 0.68  --   CREATININE 0.94  --  1.06 1.05    Estimated Creatinine Clearance: 75.2 mL/min (by C-G formula based on Cr of 1.05).   . sodium chloride Stopped (11/22/15 1500)  . sodium chloride 10 mL/hr at 11/26/15 0700    Assessment: 60 yo M on Coumadin PTA for hx LV thrombus now s/p LVAC on 5/8 and continues on coumadin. INR dropped despite doses of Coumadin above home dose, but now rising again to 1.72. Hgb/Pltc stable, no bleeding or complications noted.    PTA Coumadin dose 2.5 mg daily  Goal of Therapy:  INR 2-2.5 Monitor platelets by anticoagulation protocol: Yes   Plan:  -Lovenox 40 mg x 1 and warfarin 7.5 mg x 1 this afternoon just before discharge. -Then plan on warfarin 5 mg daily until INR recheck on Friday. -No further Lovenox after today (pt unable to inject himself at home)  Reece Leader, Loura Back, BCPS  Clinical Pharmacist Pager [phone]  12/06/2015 11:21 AM

## 2015-12-06 NOTE — Progress Notes (Signed)
CSW met at bedside with patient who is scheduled for discharge today. Patient reports VAD teaching went well yesterday with his family and he was surprised how well his brother did with the dressing change. Patient's mother arrived over the weekend and ready to assist with caregiving needs upon discharge. Patient spoke about his mental readiness for discharge and life going forward with a VAD. Patient also mentioned he spoke with Thurmond Butts at disability determination and hopeful for resolution to his case soon. They are still awaiting records from hospital in [place], [place]. Brewer provided supportive intervention and encouraged patient to attend LVAD Support group and upcoming VADiversary event. Patient verbalizes understanding and will follow up with CSW in HF/VAD clinic as needed post hospitalization. Patient appears ready for discharge home.CSW available as needed through HF/VAD clinic. Raquel Sarna, LCSW [phone]

## 2015-12-06 NOTE — Progress Notes (Addendum)
Patient given coumadin and Lovenox as ordered prior to discharge, Patient and Mother given discharge instructions medication list and Paper prescription given. Patient other medications electronically sent to personal pharmacy. Follow up appointments also given to patient and understanding was verbalized. All questions answered will discharge home as ordered with family. Bernard Donahoo, Randall An RN

## 2015-12-06 NOTE — Discharge Summary (Signed)
Advanced Heart Failure Team  Discharge Summary   Patient ID: Matthew Vaughan MRN: [id number], DOB/AGE: Aug 23, 1955 60 y.o. Admit date: 11/05/2015 D/C date:     12/06/2015   Primary Discharge Diagnoses:  1. S/P HMII LVAD s/p HM II 11/21/15:  2. Acute/chronic systolic CHF: Cardiogenic shock. Nonischemic cardiomyopathy. s/p VAD as above 3. LV thrombus: Small thrombus on MRI. On Warfarin, goal INR 2-2.5.  4.Post op Anemia:  5. Hyperbilirubinemia: Total bilirubin 9-10 range post-op, trended down Suspect related CHF.   Hospital Course:   Matthew Vaughan is a 60 y.o. male with prior LV thrombus and chronic systolic HF now s/p LVAD as above, who presented to [facility] 11/05/15 with worsening DOE and substernal CP. Initial troponin 0.16 -> 0.78 4 hours later.  Pain was improved by NTG. Started on IV lasix and heparin gtt for NSTEM and hx of LF thrombus. Chest pain improved and Coronary angiography planned for 11/07/15.   R/LHC 11/07/15 with normal coronaries, elevated fillings pressures and low cardiac output (Fick CO 2.72/CI 1.39; full results below) so started on milrinone.  PICC placed to monitor Mixed venous sat (coox) and CVP. Pt considered for VAD with his degree of disease and decreased functional status. Diuresed well on IV lasix.  Met with VAD coordinator and HF CSW.  Echo 11/09/15 with LVEF 10-15% and Grade 2 DD. Mild LAE and RAE. Mild MR.  PFTs 11/09/15 as below.   Attempted milrinone wean 11/10/15. Pt felt much more fatigued and coox down to 51.9%. Milrinone started back at 0.25.  Pt met with LVAD surgeon as continued part of work up.   On 11/13/15 CBC low at 9.0 on CBC (7.9 on coox) with no source of bleeding. Hgb 12.3 on 11/14/15 so unclear reason for drop. Torsemide held for low fluid status for several days at beginning of May.  With patient failing milrinone wean, decision was made to pursue LVAD during this admission. Barriers were lack of insurance (Medicaid pending) and unclear family  support.  HFSW and VAD coordinator had extended meetings and talks with family and family decided on pts mother, a retired Therapist, sports, to be his primary caregiver, along with some help from his brother in [place].   Patient accounting and finance department consulted with patients pending Medicaid Status.  It was approved to proceed with LVAD placement as thought to be best for patient and in discussing with Medicaid and Social Work he is set for [employer] like toward the end of summer, with back coverage from his date of application (possibly further). Discussed with Medicaid as well and back pay should include LVAD implant.  Cardiac MRI was done, showing severe LV dilation with EF 8%, small LV thrombus, and mildly dilated RV with moderately decreased systolic function. There was a patchy LGE pattern in the inferior wall, ?prior myocarditis. HIV negative.   EGD and Colon ordered as part of LVAD work up.  Both normal as below, apart from internal hemorrhoids.   Pts SBP dropped into 80s while trying to titrate lisinopril on 11/16/15. Norepi 3 mcg added. Repeat RHC ordered prior to VAD placement to reassess filling pressures and output. RHC performed 11/18/15 showed optimized hemodynamics, full results below.   HeartMate II LVAD placed by Dr Cyndia Bent on 08/18/74 without complications. He remained hemodynamically stable on Milrinone 0.375, levophed 15 mcg and NO 30 ppm post op. He began to wake on vent, responded appropriately to commands, and drips were weaned as able.   Pt did have some anemia post  op, with expected blood loss from surgery. He was extubated 11/22/15 and swan was removed. Warfarin restarted with goal INR of 2-2.5  Pt had sinus tachycardia after LVAD placement, thought to be due to his pocket pain. He also had some hyperbilirubinemia post op in the 9-10 range which gradually trended down without incident.   Meds titrated as tolerated. Lisinopril added with improvement in MAPs from 100s to  80s.  LDH stable and had no evidence of pump thrombosis.   Milrinone stopped 11/26/15 and patient tolerated well.   HR improved gradually with better pain control.   On morning of 5/97/41 new "clicking" sound noted when auscultating pump.  CXR with proper placement of pump. CXR also showed small loculated left hydropneumothorax. Had ramp echo on 11/30/15 with speed increased from 9000 => 9600.  PI dropped overnight 12/04/15 so lasix stopped and speed decreased to 9400. He required no further lasix this admission.  Hospital course complicated by sub-therapeutic INR despite increase coumadin dosing. Pt had only been requiring 2.5 mg coumadin daily to maintain therapeutic INR, but required doses up to 10 mg this admission.  Last several days of admission INR 1.9 -> 1.57 -> 1.55 -> 1.72.  The use of Lovenox bridge was discussed with patient but he refused to use at home as he will not give himself the shot and no family member was willing/able.  Per VAD team usual protocol of no bridging necessary once INR > 1.8, it was decided to give patient evening dose of lovenox on 12/06/15 and let him go home with close INR follow up later in the week. HF MD, VAD coordinator, and Pharm-D in agreement for this plan.   Pt worked with cardiac rehab and completed all recommend therapy prior to d/c.  He worked with PT and had recommendation of rollator for home as well as HHPT. Orders placed and pt requested to use [facility].   He will be discharged to home in stable condition. Will have INR check on Friday by [facility] and will see in clinic next Tuesday as below.   HeartMate II LVAD: Flow 4.8 liters/min, speed 9600, power 5.7, PI 4.7. No PI events.   Discharge Weight Range: 152 lbs Discharge Vitals: Blood pressure 78/0, pulse 97, temperature 98.1 F (36.7 C), temperature source Oral, resp. rate 18, height '5\' 11"'$  (1.803 m), weight 152 lb 12.5 oz (69.3 kg), SpO2 100 %.  Labs: Lab Results  Component Value Date   WBC 9.2 12/06/2015    HGB 9.5* 12/06/2015   HCT 30.0* 12/06/2015   MCV 100.7* 12/06/2015   PLT 441* 12/06/2015     Recent Labs Lab 12/06/15 0550  NA 132*  K 4.3  CL 96*  CO2 27  BUN 14  CREATININE 1.05  CALCIUM 9.2  GLUCOSE 110*   Lab Results  Component Value Date   CHOL 123 11/09/2015   HDL 47 11/09/2015   LDLCALC 59 11/09/2015   TRIG 85 11/09/2015   BNP (last 3 results)  Recent Labs  11/22/15 0416  BNP 585.9*    ProBNP (last 3 results) No results for input(s): PROBNP in the last 8760 hours.   Diagnostic Studies/Procedures   [facility] 11/07/15  Left: Normal Coronaries RHC Hemodynamics RA mean 22 RV 56/25 PA 57/34, mean 25 PCWP mean 35 AO 83/68 Oxygen saturations: PA 36% AO 100% Cardiac Output (Fick) 2.72  Cardiac Index (Fick) 1.39  PVR 2.6 WU  Echo 11/09/15 with LVEF 10-15% and Grade 2 DD. Mild LAE and RAE.  Mild MR.   PFTs 11/09/15  FCV 3.39 (83%) FEV1 2.55 (80%) FEV1/FVC (75%)  EGD 11/17/15 - No abnormalities Colonoscopy 11/17/15 - Normal except for internal hemorrhoids.   RHC 11/18/15 (On Milrinone 0.375 and Norepi 3) RHC Procedural Findings: Hemodynamics (mmHg) RA mean 2 RV 47/7 PA 48/17, mean 31 PCWP mean 18 Oxygen saturations: PA 100% AO 65% Cardiac Output (Fick) 4.97  Cardiac Index (Fick) 2.59 PVR 2.6 WU  Echo TEE 11/21/15  LVEF 5-10%, Mild MR, Mod/Sev LAE, Mod RAE, No PFO.   RAMP Echo 11/30/15 LVEF 10-15% - Speed 9000 -> 9600  Discharge Medications     Medication List    STOP taking these medications        LASIX 40 MG tablet  Generic drug:  furosemide     metoprolol tartrate 25 MG tablet  Commonly known as:  LOPRESSOR      TAKE these medications        aspirin 81 MG chewable tablet  Chew 1 tablet (81 mg total) by mouth daily.     lisinopril 5 MG tablet  Commonly known as:  PRINIVIL,ZESTRIL  Take 1 tablet (5 mg total) by mouth daily.     oxyCODONE 5 MG immediate release tablet  Commonly known as:  Oxy IR/ROXICODONE  Take 1-2 tablets  (5-10 mg total) by mouth every 6 (six) hours as needed for severe pain.     pantoprazole 40 MG tablet  Commonly known as:  PROTONIX  Take 1 tablet (40 mg total) by mouth daily.     warfarin 5 MG tablet  Commonly known as:  COUMADIN  Take 1 tablet (5 mg total) by mouth daily. As directed by HF clinic.  Start taking on:  12/07/2015        Disposition   The patient will be discharged in stable condition to home. Discharge Instructions    Amb Referral to Cardiac Rehabilitation    Complete by:  As directed   Diagnosis:   Heart Failure (see criteria below if ordering Phase II) Comment - s/p LVAD Other    Heart Failure Type:  Chronic Systolic & Diastolic     Diet - low sodium heart healthy    Complete by:  As directed      Heart Failure patients record your daily weight using the same scale at the same time of day    Complete by:  As directed      Increase activity slowly    Complete by:  As directed           Follow-up Information    Follow up with Loralie Champagne, MD On 12/13/2015.   Specialty:  Cardiology   Why:  at 1000 am for post hospital follow up. Please bring all of your medications with you to your visit.    Contact information:   [address] [place] [address] [phone]       Follow up with [facility].   Why:  HHRN for HF follow up arranged INR to be drawn 5/26   Contact information:   [address] [place] [address] [phone]       Follow up with [facility].   Why:  rollator arranged- to be delivered to room prior to discharge   Contact information:   [address] [place] [address] [phone]         Duration of Discharge Encounter: Greater than 35 minutes  Signed, Shirley Friar, PA-C 12/06/2015, 3:20 PM

## 2015-12-06 NOTE — Progress Notes (Signed)
PT Cancellation Note  Patient Details Name: Jasn Venecia MRN: [id number] DOB: Nov 20, 1955   Cancelled Treatment:    Reason Eval/Treat Not Completed: Other (comment) (Pt in bathroom.  "Will be a bit," per pt. Will return as able.)   [name] 12/06/2015, 12:03 PM  Sanna Porcaro,PT Acute Rehabilitation [phone] (pager)

## 2015-12-06 NOTE — Progress Notes (Signed)
  LVAD Exit Site: Mother at bedside for dressing change review.  Demonstrated proper dressing change technique using daily dressing kit and aquacel silver strips. Mother is a Marine scientist and is comfortable with sterile technique. Brother was at bedside and has successfully changed dressing during this hospitalization.  VAD dressing removed and site care performed using sterile technique. Drive line exit site cleaned with Chlora prep applicators x 2, allowed to dry, and gauze dressing with aquacel strip re-applied. Exit site with partial tissue ingrowth and two sutures intact. Outer suture removed, one suture remains intact. The velour is fully implanted at exit site. Small amount tan drainage with no redness, tenderness, or foul odor noted. Drive line intact and accurately applied. Driveline dressing is being changed daily per sterile technique.

## 2015-12-06 NOTE — Progress Notes (Signed)
CARDIAC REHAB PHASE I  Pt for discharge today. Pt declines ambulation at this time, states he has no questions regarding education at this time. Phase 2 referral sent to [facility]. Pt in good spirits, eager for discharge. Pt sitting on edge of bed, call bell within reach.   Joylene Grapes, RN, BSN 12/06/2015 11:17 AM

## 2015-12-06 NOTE — Progress Notes (Signed)
Patient ID: Matthew Vaughan, male   DOB: 04/06/1956, 60 y.o.   MRN: [id number]    Patient ID: Matthew Vaughan, male   DOB: [birth year], 60 y.o.   MRN: [id number] HeartMate 2 Rounding Note  Subjective:    S/P HMII LVAD May 02/2016.   S/p Ramp Echo 11/30/15. Speed 9000 => 9600.  Speed decreased to 9400 on 5/21 with decreased PI.  CXR 11/30/15 with small loculated left hydropneumothorax. Cannula and pump position appeared stable.  On warfarin per pharmacy. INR 1.99 -> 1.57 -> 1.55 -> 1.72,  LDH stable 266 -> 258 -> 235.   Feels good. Walking halls. No SOB.  No PI events.  Now off Lasix.   LVAD INTERROGATION:  HeartMate II LVAD:  Flow 4.8 liters/min, speed 9600, power 5.7, PI 4.7.  No PI events.   Objective:    Vital Signs:   Temp:  [98.1 F (36.7 C)-98.4 F (36.9 C)] 98.1 F (36.7 C) (05/23 0356) Pulse Rate:  [97-103] 97 (05/23 0356) Resp:  [18] 18 (05/23 0356) SpO2:  [98 %-100 %] 100 % (05/23 0356) Weight:  [152 lb 12.5 oz (69.3 kg)] 152 lb 12.5 oz (69.3 kg) (05/23 0356) Last BM Date: 12/04/15 Mean arterial Pressure 70s-80s  Intake/Output:   Intake/Output Summary (Last 24 hours) at 12/06/15 0951 Last data filed at 12/06/15 0703  Gross per 24 hour  Intake    600 ml  Output   1475 ml  Net   -875 ml     Physical Exam: General:  Well appearing HEENT: Normal Neck: supple. JVP not elevated. Carotids 2+ bilat; no bruits. No thyromegaly or nodule noted.  Cor: Mechanical heart sounds with LVAD hum present. Sternal dressing intact. No audible click Lungs: clear Abdomen: soft, non-tender, non-distended, no HSM. No bruits or masses. +BS   Driveline: C/D/I; securement device intact and driveline incorporated Extremities: no cyanosis, clubbing, rash, edema Neuro: Alert and oriented x3   Telemetry: NSR 90s  Labs: Basic Metabolic Panel:  Recent Labs Lab 12/02/15 0425 12/03/15 0445 12/04/15 0546 12/05/15 0240 12/06/15 0550  NA 133* 131* 132* 131* 132*  K 4.5 4.4 4.7 4.6 4.3    CL 95* 92* 93* 95* 96*  CO2 GLUCOSE 114* 113* 119* 123* 110*  BUN CREATININE 1.03 1.01 0.94 1.06 1.05  CALCIUM 9.3 9.0 9.4 9.3 9.2    Liver Function Tests: No results for input(s): AST, ALT, ALKPHOS, BILITOT, PROT, ALBUMIN in the last 168 hours. No results for input(s): LIPASE, AMYLASE in the last 168 hours. No results for input(s): AMMONIA in the last 168 hours.  CBC:  Recent Labs Lab 11/30/15 1115 12/01/15 0540 12/03/15 0445 12/05/15 0240 12/06/15 0550  WBC 10.4 8.9 7.1 9.6 8.0  NEUTROABS  --   --  3.8  --   --   HGB 10.1* 9.7* 9.0* 9.8* 8.8*  HCT 30.1* 30.0* 28.7* 31.8* 28.3*  MCV 99.3 97.7 98.6 101.3* 101.1*  PLT 457* 473* 447* 471* 402*    INR:  Recent Labs Lab 12/02/15 0425 12/03/15 0445 12/04/15 0546 12/05/15 0240 12/06/15 0550  INR 1.98* 1.99* 1.57* 1.55* 1.72*    Other results:    Imaging: No results found.   Medications:     Scheduled Medications: . aspirin  81 mg Oral Daily  . bisacodyl  10 mg Oral Daily   Or  . bisacodyl  10 mg Rectal Daily  . docusate sodium  200 mg Oral Daily  .  enoxaparin (LOVENOX) injection  40 mg Subcutaneous Q12H  . feeding supplement (ENSURE ENLIVE)  237 mL Oral BID BM  . fentaNYL  75 mcg Transdermal Q72H  . lisinopril  5 mg Oral Daily  . pantoprazole  40 mg Oral Daily  . sodium chloride flush  10-40 mL Intracatheter Q12H  . sodium chloride flush  3 mL Intravenous Q12H  . Warfarin - Pharmacist Dosing Inpatient   Does not apply q1800    Infusions: . sodium chloride Stopped (11/22/15 1500)  . sodium chloride 10 mL/hr at 11/26/15 0700    PRN Medications: hydrALAZINE, ondansetron (ZOFRAN) IV, oxyCODONE, sodium chloride flush, sodium chloride flush, traMADol   Assessment/Plan/Discussion   1. S/P HMII LVAD: Doing well. Now off Lasix with higher PI.  Speed decreased to 9400.  - INR 1.72 today, now on Lovenox at 0.5 mg/kg + warfarin.  If he can figure out a way to get the  Lovenox injections, can likely go home today with Lovenox probably for 2 more days.   - ASA 81 daily.  - BP stable on lisinopril 5 daily.  2. Acute/chronic systolic CHF:  Cardiogenic shock. Nonischemic cardiomyopathy.  - He is now off Lasix and digoxin. Volume looks ok.  3. LV thrombus: Small thrombus on MRI.  On Warfarin, goal INR 2-2.5.  4. Anemia: Hemoglobin a bit lower today.  Will repeat CBC.  No overt bleeding.   5. Rhythm: He remains in sinus rhythm. 6. Hyperbilirubinemia: Total bilirubin 9-10 range post-op, trended down  Suspect this is related to CHF.   - LDH stable 7. Disposition: Ongoing LVAD teaching.  If we can get him Lovenox injections for home and someone who can inject them, he can go home today if repeat CBC is stable.  Home meds: warfarin, Lovenox injections until INR > 2, ASA 81, lisinopril 5 daily, Protonix 40 daily. Will need close followup.   I reviewed the LVAD parameters from today, and compared the results to the patient's prior recorded data.  No programming changes were made.  The LVAD is functioning within specified parameters.  The patient performs LVAD self-test daily.  LVAD interrogation was negative for any significant power changes, alarms or PI events/speed drops.  LVAD equipment check completed and is in good working order.  Back-up equipment present.   LVAD education done on emergency procedures and precautions and reviewed exit site care.  Length of Stay: 90  [name], MD 12/06/2015, 9:51 AM  VAD Team --- VAD ISSUES ONLY--- Pager [phone] (7am - 7am)  Advanced Heart Failure Team  Pager [phone] (M-F; 7a - 4p)  Please contact [facility] for night-coverage after hours (4p -7a ) and weekends on [email or web address]

## 2015-12-07 MED FILL — oxyCODONE HCL 5 MG TABS: 5 | 30 days supply | Qty: 240 | Fill #0

## 2015-12-08 ENCOUNTER — Telehealth: Payer: Self-pay | Admitting: Licensed Clinical Social Worker

## 2015-12-08 NOTE — Telephone Encounter (Signed)
CSW attempted to reach patient to follow up on discharge home. No answer and unable to leave a message. CSW will follow up with patient on clinic visit next week. Lasandra Beech, LCSW [phone]

## 2015-12-09 ENCOUNTER — Telehealth (HOSPITAL_COMMUNITY): Payer: Self-pay | Admitting: Infectious Diseases

## 2015-12-09 ENCOUNTER — Ambulatory Visit (HOSPITAL_COMMUNITY): Payer: Self-pay | Admitting: Infectious Diseases

## 2015-12-09 DIAGNOSIS — Z95811 Presence of heart assist device: Secondary | ICD-10-CM

## 2015-12-09 LAB — POCT INR: INR: 1.9

## 2015-12-09 NOTE — Telephone Encounter (Signed)
A user error has taken place: encounter opened in error, closed for administrative reasons.

## 2015-12-13 ENCOUNTER — Ambulatory Visit (HOSPITAL_COMMUNITY): Payer: Self-pay | Admitting: Unknown Physician Specialty

## 2015-12-13 ENCOUNTER — Ambulatory Visit (HOSPITAL_COMMUNITY)
Admit: 2015-12-13 | Discharge: 2015-12-13 | Disposition: A | Discharge status: Home / Self Care | Payer: Medicaid Other | Source: Ambulatory Visit | Attending: Cardiology | Admitting: Cardiology

## 2015-12-13 ENCOUNTER — Encounter: Payer: Self-pay | Admitting: Licensed Clinical Social Worker

## 2015-12-13 ENCOUNTER — Other Ambulatory Visit (HOSPITAL_COMMUNITY): Payer: Self-pay | Admitting: Unknown Physician Specialty

## 2015-12-13 VITALS — BP 119/49 | HR 103 | Ht 71.0 in | Wt 155.4 lb

## 2015-12-13 DIAGNOSIS — I5022 Chronic systolic (congestive) heart failure: Secondary | ICD-10-CM | POA: Insufficient documentation

## 2015-12-13 DIAGNOSIS — I513 Intracardiac thrombosis, not elsewhere classified: Secondary | ICD-10-CM

## 2015-12-13 DIAGNOSIS — Z79899 Other long term (current) drug therapy: Secondary | ICD-10-CM | POA: Insufficient documentation

## 2015-12-13 DIAGNOSIS — Z7901 Long term (current) use of anticoagulants: Secondary | ICD-10-CM

## 2015-12-13 DIAGNOSIS — Z7982 Long term (current) use of aspirin: Secondary | ICD-10-CM | POA: Diagnosis not present

## 2015-12-13 DIAGNOSIS — I428 Other cardiomyopathies: Secondary | ICD-10-CM | POA: Diagnosis not present

## 2015-12-13 DIAGNOSIS — Z95811 Presence of heart assist device: Secondary | ICD-10-CM

## 2015-12-13 DIAGNOSIS — Z86718 Personal history of other venous thrombosis and embolism: Secondary | ICD-10-CM | POA: Insufficient documentation

## 2015-12-13 DIAGNOSIS — I213 ST elevation (STEMI) myocardial infarction of unspecified site: Secondary | ICD-10-CM | POA: Diagnosis not present

## 2015-12-13 LAB — CBC
HCT: 29.5 % — ABNORMAL LOW (ref 39.0–52.0)
Hemoglobin: 9.3 g/dL — ABNORMAL LOW (ref 13.0–17.0)
MCH: 31 pg (ref 26.0–34.0)
MCHC: 31.5 g/dL (ref 30.0–36.0)
MCV: 98.3 fL (ref 78.0–100.0)
PLATELETS: 481 10*3/uL — AB (ref 150–400)
RBC: 3 MIL/uL — AB (ref 4.22–5.81)
RDW: 14 % (ref 11.5–15.5)
WBC: 7.2 10*3/uL (ref 4.0–10.5)

## 2015-12-13 LAB — BASIC METABOLIC PANEL
Anion gap: 5 (ref 5–15)
BUN: 13 mg/dL (ref 6–20)
CHLORIDE: 102 mmol/L (ref 101–111)
CO2: 27 mmol/L (ref 22–32)
CREATININE: 0.91 mg/dL (ref 0.61–1.24)
Calcium: 9.5 mg/dL (ref 8.9–10.3)
GFR calc Af Amer: >60 mL/min (ref >60)
GFR calc non Af Amer: >60 mL/min (ref >60)
Glucose, Bld: 114 mg/dL — ABNORMAL HIGH (ref 65–99)
Potassium: 4.3 mmol/L (ref 3.5–5.1)
Sodium: 134 mmol/L — ABNORMAL LOW (ref 135–145)

## 2015-12-13 LAB — PROTIME-INR
INR: 1.99 — AB (ref 0.00–1.49)
PROTHROMBIN TIME: 22.5 s — AB (ref 11.6–15.2)

## 2015-12-13 LAB — LACTATE DEHYDROGENASE: LDH: 294 U/L — AB (ref 98–192)

## 2015-12-13 MED ORDER — GABAPENTIN 300 MG PO CAPS: 300.0000 mg | ORAL_CAPSULE | Freq: Two times a day (BID) | ORAL | Status: DC

## 2015-12-13 NOTE — Patient Instructions (Addendum)
1. May go to every other day dressing changes. 2. Start Neurontin 300 mg twice daily, try to back off of Oxycodone. 3. Will call you with lab results.  2. Return to clinic in 1 week.

## 2015-12-13 NOTE — Progress Notes (Addendum)
Expand All Collapse All   Symptom  Yes  No  Details   Angina    x Activity:   Claudication    x How far:   Syncope     x When:   Stroke    x   Orthopnea     How many pillows:  for comfort  PND    x How often:  CPAP    How many hrs:   Pedal edema    x   Abd fullness    x   N&V    x  appetite is on and off  Diaphoresis    x When:  Bleeding   x No BPBPR or in urine  Urine color   Light yellow  SOB    x Activity:   Palpitations    x When:  ICD shock    x   Hospitalizations   x   When/where/why:4/22-5/23 chf exar and implant  ED visit    x When/where/why:  Other MD    x When/who/why:  Activity    Walking around the house and outside  Fluid    ~2 L daily   Diet    Low sodium   Vital signs: HR: 103 Doppler MAP: 84 Auto cuff: 119/49 (72) O2 Sat: 96% Wt: 155.4 lbs Last weight: 152 lbs Ht: 5'11"  LVAD interrogation reveals (reviewed with Dr Shirlee Latch):  Speed: 9400 Flow: 5.6 Power: 6.0 w PI: 4.5 Alarms: none Events: rare PI Fixed speed: 9600 Low speed limit: 9000 Primary Controller: Replace back up battery in 25 months. Back up controller: Replace back up battery in 25 months.   I reviewed the LVAD parameters from oday, and compared the results to the patient's prior recorded data. No programming changes were made. The LVAD is functioning within specified parameters. LVAD interrogation was negative for any significant power changes or alarms; a few PI events/speed drops present which is consistent with the patient's history.   LVAD equipment check completed and is in good working order. Back-up equipment present. LVAD education done on emergency procedures and precautions and reviewed exit site care.   LVAD exit site:  Gauze dressing dry and intact. No drainage noted. The velour is fully  implanted at exit site with partial tissue ingrowth. Suture removed from drive line exit site. Dressing change using sterile technique, no erythema, drainage, or foul odor. Some burning with Chlora prep sticks. Aquaseal applied with occulsive dry gauze. Advanced to every other day dressing changes. Chest tube sutures removed. Encounter Details: [place] is 4 weeks post-op today. Denies any popping or clicking in sternum or unstable movement. Palpated today without free movement indicating sternum healing well.   INR 2.19--see anticoag note. Goal 2 - 2.5. 81 mg ASA. With stopping Amio last week, will continue with frequent INRs.   LDH 358--Stable within established baseline since implant. (320 - 380)  Pt/caregiver deny any alarms or VAD equipment issues. VAD coordinator reviewed daily log from home for daily temperature, weight, and VAD parameters. Pt is performing daily controller and system monitor self tests along with completing weekly and monthly maintenance for LVAD equipment.    Patient Instructions: 1. May go to every other day dressing changes. 2. Start Neurontin 300 mg twice daily, try to back off of Oxycodone. 3. Will call you with lab results.  2. Return to clinic in 1 week.           Cardiology: Dr. Shirlee Latch  60 yo with history of nonischemic cardiomyopathy/chronic  systolic CHF and LV thrombus now s/p LVAD placement presents for VAD clinic followup.  He was admitted to [facility] in 5/17 with acute/chronic systolic CHF.  He was found by cath to have no significant coronary disease but had low output.  EF was 8% by cardiac MRI, some suggestion of prior viral myocarditis by cMRI LGE pattern.  He was begun on milrinone and we were unable to wean him off.  Decision was made to proceed to LVAD.  He had Heartmate II LVAD placed on 11/21/15.  He did well post-op, no major complications.  He is now home with his mother and his brother.    He is doing well.  Breathing much better compared  to prior to LVAD placement.  Appetite is back.  No exertional dyspnea.  He has pocket site pain and continues to use oxycodone.  Weight is stable.  No lightheadedness.  No BRBPR or melena.  LVAD interrogated stable parameters with 0-5 PI events/day.   Labs (5/17): creatinine 1.05, HCT 30, LDH 235  PMH: 1. Chronic systolic CHF: Nonischemic cardiomyopathy.  Cause uncertain => not a heavy drinker, had one sister with what sounds like sudden death. Possible viral myocarditis.   - LHC/RHC (4/27): No angiographic CAD.  Mean RA 22, PA 57/34, mean PCWP 35, CI 1.39, PVR 2.6 WU. - Echo (4/17): Severely dilated LV with EF 10-15%.  - Cardiac MRI (4/17): EF 8%, severely dilated LV, LV thrombus, mildly dilated RV with moderately decreased systolic function, patchy inferior LGE pattern could be consistent with prior myocarditis.  - Heartmate II LVAD placed 11/21/15.  2. LV thrombus.   FH: No history of premature CAD or cardiomyopathy.   SH: Divorced, had been living with [place] but now in [place] with his brother, worked as Art gallery manager.  Nonsmoker.  Rare ETOH.   ROS: All systems reviewed and negative except as per HPI.   Current Outpatient Prescriptions  Medication Sig Dispense Refill  . aspirin 81 MG chewable tablet Chew 1 tablet (81 mg total) by mouth daily. 30 tablet 6  . docusate sodium (COLACE) 100 MG capsule Take 100 mg by mouth 2 (two) times daily.    Marland Kitchen lisinopril (PRINIVIL,ZESTRIL) 5 MG tablet Take 1 tablet (5 mg total) by mouth daily. 30 tablet 6  . oxyCODONE (OXY IR/ROXICODONE) 5 MG immediate release tablet Take 1-2 tablets (5-10 mg total) by mouth every 6 (six) hours as needed for severe pain. 240 tablet 0  . pantoprazole (PROTONIX) 40 MG tablet Take 1 tablet (40 mg total) by mouth daily. 30 tablet 6  . gabapentin (NEURONTIN) 300 MG capsule Take 1 capsule (300 mg total) by mouth 2 (two) times daily. 60 capsule 5  . warfarin (COUMADIN) 5 MG tablet Take 1 tablet (5 mg total) by mouth daily. As  directed by HF clinic. 45 tablet 6   No current facility-administered medications for this encounter.   BP 119/49 mmHg  Pulse 103  Ht  (1.803 m)  Wt 155 lb 6.4 oz (70.489 kg)  BMI 21.68 kg/m2  SpO2 96%  MAP 84 LVAD interrogation: See nurse's note above.   General: NAD Neck: No JVD, no thyromegaly or thyroid nodule.  Lungs: Clear to auscultation bilaterally with normal respiratory effort. CV: LVAD hum.  No peripheral edema.  Abdomen: Soft, nontender, no hepatosplenomegaly, no distention.  Skin: Intact without lesions or rashes. Driveline site benign.  Neurologic: Alert and oriented x 3.  Psych: Normal affect. Extremities: No clubbing or cyanosis.  HEENT: Normal.  Assessment/Plan: 1. HeartMate II LVAD: Patient is doing well on his first post-op visit.  Still having some pocket pain.  LVAD parameters are stable. BP is well-controlled.  - Continue lisinopril 5 mg daily for BP.  - Add gabapentin 300 mg bid for possible neuropathic pain to see if this can help him titrate off oxycodone.  - LDH, CBC, BMET today.  - Continue ASA 81 + warfarin with INR goal 2-2.5.  2. Chronic systolic CHF: Much improved since LVAD placement, now NYHA class II.  I am going to refer him to cardiac rehab.  3. LV thrombus: Noted on pre-op cMRI.  He is on warfarin.   [name] 12/13/2015

## 2015-12-13 NOTE — Progress Notes (Signed)
CSW met briefly with patient in the clinic. Patient in clinic for first post hospitalization visit and states he is feeling well. Patient states he heard back from disability and they need additional records from VAD surgery and recovery. Patient in good spirits and states all is going well at home. Patient's brother later approached CSW to inform that patient has a court appearance tomorrow for a pending eviction from his apartment in [place]. Brother states he is unable to take patient as he has to work. CSW encouraged family to contact landlord and inform of current situation. CSW offered to assist with letter but would need landlord information which brother unable to provide at time of visit. Family will call CSW if further assistance needed. CSW will continue to be available through VAD clinic. Raquel Sarna, LCSW [phone]

## 2015-12-20 ENCOUNTER — Encounter: Payer: Self-pay | Admitting: Licensed Clinical Social Worker

## 2015-12-20 ENCOUNTER — Other Ambulatory Visit (HOSPITAL_COMMUNITY): Payer: Self-pay | Admitting: Unknown Physician Specialty

## 2015-12-20 ENCOUNTER — Ambulatory Visit (HOSPITAL_COMMUNITY)
Admission: RE | Admit: 2015-12-20 | Discharge: 2015-12-20 | Disposition: A | Discharge status: Home / Self Care | Payer: Medicaid Other | Source: Ambulatory Visit | Attending: Cardiology | Admitting: Cardiology

## 2015-12-20 ENCOUNTER — Other Ambulatory Visit (HOSPITAL_COMMUNITY): Payer: Self-pay | Admitting: *Deleted

## 2015-12-20 ENCOUNTER — Ambulatory Visit (HOSPITAL_COMMUNITY): Payer: Self-pay | Admitting: *Deleted

## 2015-12-20 VITALS — BP 94/0 | HR 91 | Ht 71.0 in | Wt 162.0 lb

## 2015-12-20 DIAGNOSIS — Z86718 Personal history of other venous thrombosis and embolism: Secondary | ICD-10-CM | POA: Insufficient documentation

## 2015-12-20 DIAGNOSIS — Z79899 Other long term (current) drug therapy: Secondary | ICD-10-CM | POA: Insufficient documentation

## 2015-12-20 DIAGNOSIS — Z7982 Long term (current) use of aspirin: Secondary | ICD-10-CM | POA: Diagnosis not present

## 2015-12-20 DIAGNOSIS — Z95811 Presence of heart assist device: Secondary | ICD-10-CM

## 2015-12-20 DIAGNOSIS — I513 Intracardiac thrombosis, not elsewhere classified: Secondary | ICD-10-CM

## 2015-12-20 DIAGNOSIS — I213 ST elevation (STEMI) myocardial infarction of unspecified site: Secondary | ICD-10-CM

## 2015-12-20 DIAGNOSIS — Z7901 Long term (current) use of anticoagulants: Secondary | ICD-10-CM | POA: Insufficient documentation

## 2015-12-20 DIAGNOSIS — I5022 Chronic systolic (congestive) heart failure: Secondary | ICD-10-CM | POA: Diagnosis not present

## 2015-12-20 LAB — BASIC METABOLIC PANEL
ANION GAP: 4 — AB (ref 5–15)
BUN: 9 mg/dL (ref 6–20)
CO2: 26 mmol/L (ref 22–32)
Calcium: 9 mg/dL (ref 8.9–10.3)
Chloride: 105 mmol/L (ref 101–111)
Creatinine, Ser: 0.94 mg/dL (ref 0.61–1.24)
GFR calc Af Amer: >60 mL/min (ref >60)
GLUCOSE: 91 mg/dL (ref 65–99)
POTASSIUM: 4.2 mmol/L (ref 3.5–5.1)
Sodium: 135 mmol/L (ref 135–145)

## 2015-12-20 LAB — CBC
HEMATOCRIT: 28.5 % — AB (ref 39.0–52.0)
HEMOGLOBIN: 8.9 g/dL — AB (ref 13.0–17.0)
MCH: 30.7 pg (ref 26.0–34.0)
MCHC: 31.2 g/dL (ref 30.0–36.0)
MCV: 98.3 fL (ref 78.0–100.0)
PLATELETS: 438 10*3/uL — AB (ref 150–400)
RBC: 2.9 MIL/uL — ABNORMAL LOW (ref 4.22–5.81)
RDW: 14.3 % (ref 11.5–15.5)
WBC: 7.4 10*3/uL (ref 4.0–10.5)

## 2015-12-20 LAB — LACTATE DEHYDROGENASE: LDH: 270 U/L — AB (ref 98–192)

## 2015-12-20 LAB — PROTIME-INR
INR: 1.71 — AB (ref 0.00–1.49)
PROTHROMBIN TIME: 20 s — AB (ref 11.6–15.2)

## 2015-12-20 MED ORDER — LISINOPRIL 5 MG PO TABS: 10.0000 mg | ORAL_TABLET | Freq: Every day | ORAL | Status: DC

## 2015-12-20 NOTE — Progress Notes (Addendum)
Expand All Collapse All   Symptom  Yes  No  Details   Angina    x Activity:   Claudication    x How far:   Syncope     x When:   Stroke    x   Orthopnea     How many pillows:  for comfort  PND    x How often:  CPAP    How many hrs:   Pedal edema    x   Abd fullness    x   N&V    x  appetite improved  Diaphoresis    x When:  Bleeding   x No BPBPR or in urine  Urine color   Light yellow  SOB    x Activity:   Palpitations    x When:  ICD shock    x   Hospitalizations     x When/where/why:  ED visit    x When/where/why:  Other MD    x When/who/why:  Activity    Walking 1/2 mile daily  Fluid     < 2 L daily   Diet    Low sodium   Vital signs: HR: 91 Doppler MAP: 94 Auto cuff: 112/88 (96) O2 Sat: 95% Wt: 162 lbs Last weight: 155.4 lbs Ht: 5'11"  LVAD interrogation reveals (reviewed with Dr Shirlee Latch):  Speed: 9400 Flow: 4.7 Power:  5.5 w PI: 4.8 Alarms: none Events: rare PI Fixed speed: 9600 Low speed limit: 9000 Primary Controller: Replace back up battery in 24 months. Back up controller: Replace back up battery in 24 months.   I reviewed the LVAD parameters from oday, and compared the results to the patient's prior recorded data. No programming changes were made. The LVAD is functioning within specified parameters. LVAD interrogation was negative for any significant power changes or alarms; a few PI events/speed drops present which is consistent with the patient's history. Pt does have history of some lower PI's 2.5 range.   LVAD equipment check completed and is in good working order. Back-up equipment present. LVAD education done on emergency procedures and precautions and reviewed exit site care.   LVAD exit site:  Gauze dressing dry and intact. No drainage noted. The velour is  fully implanted at exit site with near complete tissue ingrowth.  Dressing change using sterile technique, no erythema, drainage, or foul odor. Some burning with Chlora prep sticks. Aquaseal silver strip applied with occulsive dry gauze. Advanced to every three days dressing changes. Pt has adequate dressing supplies at home.   Encounter Details: Mr. Castrejon is 4 weeks post-op today. Denies any popping or clicking in sternum or unstable movement. Palpated today without free movement indicating sternum healing well.   INR 1.71 --see anticoag note. Goal 2 - 2.5. 81 mg ASA.     LDH 278 --Stable within established baseline since implant.   MAP 96 -- will increase Lisinopril to 10 mg daily per Dr. Shirlee Latch  Lower PI's noted on history - encouraged to increase fluid intake.   Pt denies any alarms or VAD equipment issues. He did not bring daily log for review. Primary caregiver (mother) did not accompany pt. Explained importance of bringing records and caregiver with him to each clinic visit and he verbalized understanding of same.   Pt asked about driving and starting workout at gym.  No driving until 6 week window post transplant and asked pt to try cardiac rehab first (states they contacted him yesterday and encouraged to f/o with  same).    Patient Instructions: 1.  Increase Lisinopril to 10 mg daily. 2.  Increase your fluid intake. 3.  No driving for two weeks. 4.  Will call you with lab results and coumadin dosing. 5.  Will refer you for cardiac rehab. 6.  Dressing changes every three days.  If increased drainage, redness noticed, call   VAD office at [phone] and resume daily dressing changes. 7.  Return to VAD clinic in one wek.          Hessie Diener, RN VAD Coordinator [phone]    Cardiology: Dr. Shirlee Latch  60 yo with history of nonischemic cardiomyopathy/chronic systolic CHF and LV thrombus now s/p LVAD placement presents for VAD clinic followup.  He was admitted to [facility] in 5/17 with acute/chronic systolic CHF.  He was found by cath to have no significant coronary disease but had low output.  EF was 8% by cardiac MRI, some suggestion of prior viral myocarditis by cMRI LGE pattern.  He was begun on milrinone and we were unable to wean him off.  Decision was made to proceed to LVAD.  He had Heartmate II LVAD placed on 11/21/15.  He did well post-op, no major complications.  He is now home with his mother and his brother.    He is doing well.  Breathing much better compared to prior to LVAD placement, no dyspnea now.  Wants to drive.  Appetite is back.  Pain better.  Weight is stable.  No lightheadedness.  No BRBPR or melena.  PI running lower at times. LVAD interrogated stable parameters with 0-5 PI events/day (rare) though on 6/4 had 12 PI events.  He admits to not drinking a lot of fluid, used to being told to cut back on fluid intake.   Labs (5/17): creatinine 1.05 => 0.91, HCT 30, LDH 235 => 294  PMH: 1. Chronic systolic CHF: Nonischemic cardiomyopathy.  Cause uncertain => not a heavy drinker, had one sister with what sounds like sudden death. Possible viral myocarditis.   - LHC/RHC (4/27): No angiographic CAD.  Mean RA 22, PA 57/34, mean PCWP 35, CI 1.39, PVR 2.6 WU. - Echo (4/17): Severely dilated LV with EF 10-15%.  - Cardiac MRI (4/17): EF 8%, severely dilated LV, LV thrombus, mildly dilated RV with moderately decreased systolic function, patchy inferior LGE pattern could be consistent with prior myocarditis.  - Heartmate II LVAD placed 11/21/15.  2. LV thrombus.   FH: No history of premature CAD or cardiomyopathy.   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  Nonsmoker.  Rare ETOH.   ROS: All systems reviewed and negative except as per HPI.   Current Outpatient Prescriptions  Medication Sig Dispense Refill  . aspirin 81 MG chewable tablet Chew 1 tablet (81 mg total) by mouth daily. 30 tablet 6   . docusate sodium (COLACE) 100 MG capsule Take 100 mg by mouth 2 (two) times daily.    Marland Kitchen gabapentin (NEURONTIN) 300 MG capsule Take 1 capsule (300 mg total) by mouth 2 (two) times daily. 60 capsule 5  . lisinopril (PRINIVIL,ZESTRIL) 5 MG tablet Take 2 tablets (10 mg total) by mouth daily. 60 tablet 6  . oxyCODONE (OXY IR/ROXICODONE) 5 MG immediate release tablet Take 1-2 tablets (5-10 mg total) by mouth every 6 (six) hours as needed for severe pain. 240 tablet 0  . pantoprazole (PROTONIX) 40 MG tablet Take 1 tablet (40 mg total) by mouth  daily. 30 tablet 6  . warfarin (COUMADIN) 5 MG tablet Take 1 tablet (5 mg total) by mouth daily. As directed by HF clinic. 45 tablet 6   No current facility-administered medications for this encounter.   BP 94/0 mmHg  Pulse 91  Ht  (1.803 m)  Wt 162 lb (73.483 kg)  BMI 22.60 kg/m2  SpO2 95%  MAP 94 LVAD interrogation: See nurse's note above.   General: NAD Neck: No JVD, no thyromegaly or thyroid nodule.  Lungs: Clear to auscultation bilaterally with normal respiratory effort. CV: LVAD hum.  No peripheral edema.  Abdomen: Soft, nontender, no hepatosplenomegaly, no distention.  Skin: Intact without lesions or rashes. Driveline site benign.  Neurologic: Alert and oriented x 3.  Psych: Normal affect. Extremities: No clubbing or cyanosis.  HEENT: Normal.  Assessment/Plan: 1. HeartMate II LVAD: Patient is doing well.  Pocket pain better.  LVAD parameters are stable. BP is a bit high.  PI has been running lower.  - Increase lisinopril to 10 mg daily.   - Continue gabapentin for pocket pain.  - LDH, CBC, BMET today.  - Continue ASA 81 + warfarin with INR goal 2-2.5.  2. Chronic systolic CHF: Much improved since LVAD placement, now NYHA class II.  He can start cardiac rehab. 3. LV thrombus: Noted on pre-op cMRI.  He is on warfarin.   He should be ready to drive in about 2 wks.   [name] 12/21/2015

## 2015-12-20 NOTE — Patient Instructions (Addendum)
1.  Increase Lisinopril to 10 mg daily. 2.  Increase your fluid intake. 3.  No driving for two weeks. 4.  Will call you with lab results and coumadin dosing. 5.  Will refer you for cardiac rehab. 6.  Dressing changes every three days.  If increased drainage, redness noticed, call VAD office at [phone] and resume daily dressing changes. 7.  Return to VAD clinic in one wek.

## 2015-12-20 NOTE — Progress Notes (Signed)
CSW met with patient in the clinic. Patient reports he is feeling well and walking almost a half mile every morning. Patient reports his housing issues with his apartment in [place] seems to be resolving itself and he remains living with his brother here in [place] for the time being with his mother as caregiver. Patient is hopeful to start driving again when cleared to gain some more independence. Patient states his care is going well and denies any concerns at this time. Patient appears to be coping well with living with his LVAD and feels "it was worth it". CSW will continue to follow for support and encouragement through the VAD clinic. Jackie Brennan, LCSW [phone]   

## 2015-12-23 ENCOUNTER — Other Ambulatory Visit (HOSPITAL_COMMUNITY): Payer: Self-pay

## 2015-12-23 DIAGNOSIS — Z95811 Presence of heart assist device: Secondary | ICD-10-CM

## 2015-12-26 ENCOUNTER — Telehealth: Payer: Self-pay | Admitting: Licensed Clinical Social Worker

## 2015-12-26 NOTE — Telephone Encounter (Signed)
CSW received message from Carlton Adam, VAD Coordinator stating she received call from patient's brother this morning about financial assistance with utility bill. CSW left message for return call. Lasandra Beech, LCSW [phone]

## 2015-12-27 NOTE — Telephone Encounter (Signed)
CSW received return call from patient's brother asking about program available to assist with utility bills. Patient's brother said he was informed that the VAD clinic would offer financial assistance if needed. CSW explained that Heart Failure Fund available for financial assistance when extreme circumstances and all other community resources have been exhausted. Patient's brother also reminded that family agreed to financial support of patient at time of initial VAD assessment as patient has no income yet as he is still in process of disability review and pending Social Security and Environmental consultant. Patient's brother states ''I was just inquiring" as he stated that his electric bill has been on the rise since his brother and mother have been staying with him. CSW offered support and discussed community resources. Patient's brother verbalizes that he is very familiar with community resources and will return call if further assistance needed. CSW continues to be available through VAD clinic. Lasandra Beech, LCSW [phone]

## 2015-12-28 ENCOUNTER — Telehealth: Payer: Self-pay | Admitting: Licensed Clinical Social Worker

## 2015-12-28 MED FILL — LISINOPRIL 5 MG TABLET: 5 | 30 days supply | Qty: 30 | Fill #1 | Status: TO

## 2015-12-28 MED FILL — PANTOPRAZOLE SOD DR 40 MG T: 40 | 30 days supply | Qty: 30 | Fill #1 | Status: TO

## 2015-12-28 NOTE — Telephone Encounter (Signed)
CSW received message from [facility] worker Vikki Ports [phone] stating that patient has been approved for Medicaid. Patient was approved from 07/17/2015 thru 10/03/2016 and medicaid # is [id number]. CSW contacted patient via cell to inform and follow up with no answer. CSW will continue to attempt contact with patient/family. Lasandra Beech, LCSW [phone]

## 2015-12-29 ENCOUNTER — Ambulatory Visit (HOSPITAL_COMMUNITY)
Admission: RE | Admit: 2015-12-29 | Discharge: 2015-12-29 | Disposition: A | Discharge status: Home / Self Care | Payer: Medicaid Other | Source: Ambulatory Visit | Attending: Internal Medicine | Admitting: Internal Medicine

## 2015-12-29 ENCOUNTER — Encounter (HOSPITAL_COMMUNITY): Payer: Self-pay

## 2015-12-29 ENCOUNTER — Other Ambulatory Visit (HOSPITAL_COMMUNITY): Payer: Self-pay | Admitting: Infectious Diseases

## 2015-12-29 ENCOUNTER — Ambulatory Visit (HOSPITAL_COMMUNITY)
Admission: RE | Admit: 2015-12-29 | Discharge: 2015-12-29 | Disposition: A | Discharge status: Home / Self Care | Payer: Medicaid Other | Source: Ambulatory Visit | Attending: Cardiothoracic Surgery | Admitting: Cardiothoracic Surgery

## 2015-12-29 ENCOUNTER — Ambulatory Visit (HOSPITAL_COMMUNITY): Payer: Self-pay | Admitting: Infectious Diseases

## 2015-12-29 VITALS — BP 114/84 | HR 100 | Ht 71.0 in | Wt 169.6 lb

## 2015-12-29 DIAGNOSIS — I428 Other cardiomyopathies: Secondary | ICD-10-CM | POA: Insufficient documentation

## 2015-12-29 DIAGNOSIS — Z7901 Long term (current) use of anticoagulants: Secondary | ICD-10-CM

## 2015-12-29 DIAGNOSIS — Z5181 Encounter for therapeutic drug level monitoring: Secondary | ICD-10-CM

## 2015-12-29 DIAGNOSIS — Z95811 Presence of heart assist device: Secondary | ICD-10-CM

## 2015-12-29 DIAGNOSIS — J9811 Atelectasis: Secondary | ICD-10-CM | POA: Diagnosis not present

## 2015-12-29 DIAGNOSIS — Z9889 Other specified postprocedural states: Secondary | ICD-10-CM | POA: Insufficient documentation

## 2015-12-29 DIAGNOSIS — Z79899 Other long term (current) drug therapy: Secondary | ICD-10-CM | POA: Diagnosis not present

## 2015-12-29 DIAGNOSIS — J90 Pleural effusion, not elsewhere classified: Secondary | ICD-10-CM | POA: Diagnosis not present

## 2015-12-29 DIAGNOSIS — I10 Essential (primary) hypertension: Secondary | ICD-10-CM | POA: Diagnosis not present

## 2015-12-29 DIAGNOSIS — I5022 Chronic systolic (congestive) heart failure: Secondary | ICD-10-CM | POA: Insufficient documentation

## 2015-12-29 DIAGNOSIS — Z86718 Personal history of other venous thrombosis and embolism: Secondary | ICD-10-CM | POA: Insufficient documentation

## 2015-12-29 DIAGNOSIS — Z7982 Long term (current) use of aspirin: Secondary | ICD-10-CM | POA: Insufficient documentation

## 2015-12-29 LAB — CBC
HCT: 29 % — ABNORMAL LOW (ref 39.0–52.0)
Hemoglobin: 9 g/dL — ABNORMAL LOW (ref 13.0–17.0)
MCH: 30.9 pg (ref 26.0–34.0)
MCHC: 31 g/dL (ref 30.0–36.0)
MCV: 99.7 fL (ref 78.0–100.0)
PLATELETS: 330 10*3/uL (ref 150–400)
RBC: 2.91 MIL/uL — AB (ref 4.22–5.81)
RDW: 15.5 % (ref 11.5–15.5)
WBC: 5.9 10*3/uL (ref 4.0–10.5)

## 2015-12-29 LAB — BASIC METABOLIC PANEL
Anion gap: 5 (ref 5–15)
BUN: 6 mg/dL (ref 6–20)
CALCIUM: 8.9 mg/dL (ref 8.9–10.3)
CO2: 25 mmol/L (ref 22–32)
CREATININE: 1.17 mg/dL (ref 0.61–1.24)
Chloride: 108 mmol/L (ref 101–111)
GFR calc non Af Amer: >60 mL/min (ref >60)
Glucose, Bld: 77 mg/dL (ref 65–99)
Potassium: 3.7 mmol/L (ref 3.5–5.1)
SODIUM: 138 mmol/L (ref 135–145)

## 2015-12-29 LAB — PROTIME-INR
INR: 1.7 — ABNORMAL HIGH (ref 0.00–1.49)
PROTHROMBIN TIME: 20 s — AB (ref 11.6–15.2)

## 2015-12-29 LAB — LACTATE DEHYDROGENASE: LDH: 262 U/L — ABNORMAL HIGH (ref 98–192)

## 2015-12-29 NOTE — Progress Notes (Signed)
Symptom  Yes  No  Details   Angina    x Activity:   Claudication    x How far:   Syncope     x When:   Stroke    x   Orthopnea     How many pillows:  for comfort  PND    x How often:  CPAP    How many hrs:   Pedal edema    x   Abd fullness    x   N&V    x Appetite improved   Diaphoresis    x When:  Bleeding   x No BPBPR or in urine  Urine color   Light yellow  SOB    x Activity:   Palpitations    x When:  ICD shock    x   Hospitalizations     x When/where/why:  ED visit    x When/where/why:  Other MD    x When/who/why:  Activity    Walking 1/2 - 1 mile daily  Fluid    < 2 L daily   Diet    Low sodium   Vital signs: HR: 91 Doppler MAP: 94 Auto cuff: 112/88 (96) O2 Sat: 95% Wt: 162 lbs Last weight: 155.4 lbs Ht: 5'11"  LVAD interrogation reveals (reviewed with Dr Gala Romney):  Speed: 9400 Flow: 5.2 Power: 5.9 w PI: 5.8  Alarms: none Events: rare PI  Fixed speed: 9600 Low speed limit: 9000  Primary Controller: Replace back up battery in 24 months. Back up controller: Replace back up battery in 24 months.   I reviewed the LVAD parameters from oday, and compared the results to the patient's prior recorded data. No programming changes were made. The LVAD is functioning within specified parameters. LVAD interrogation was NEGATIVE for any significant power changes or alarms; RARE PI events/speed drops present which is consistent with the patient's history. Pt does have history of some lower PI's 3.0 range.   LVAD equipment check completed and is in good working order. Back-up equipment present. LVAD education done on emergency procedures and precautions and reviewed exit site care.   LVAD exit site:  Gauze dressing dry and intact. No drainage noted. The velour is fully implanted at  exit site with near complete tissue ingrowth. Dressing change using sterile technique, no erythema, drainage, or foul odor. Some burning with Chlora prep sticks. Patient reports he is performing his own dressing changes. Advanced to weekly kits today. Taught him how to perform these dressing changes and he feels comfortable with instructions. Will RTC in 2 weeks to reassess progress of ingrowth and consider teaching him how to use shower equipment at that time.   Encounter Details: Mr. Sarsour is 5 weeks post-op today. Denies any popping or clicking in sternum or unstable movement of the chest wall/sternum. Palpated today without free movement indicating sternum healing well. PA/LAT CXR obtained per routine post surgical FU to ensure proper approximation of sternal incision.   Pt denies any alarms or VAD equipment issues. Reviewed home log he brought today. Primary caregiver (mother) did not accompany pt.   INR 1.71 --see anticoag note. Goal 2 - 2.5. 81 mg ASA.     LDH 278 --Stable within established baseline since implant.   Hgb 9.0 stable since last visit and on current anticoagulation.  MAP 94 - 98 with doppler pressure reflecting true MAP today. D/W Dr. Gala Romney and will increase Lisinopril to 20 mg daily (Rx sent).   PIs are improved since last  clinic visit as he is increasing his fluid intake.   Re-sent referral to cardiac rehab as he has not been notified.   RTC in 2 weeks.   Rexene Alberts, RN VAD Coordinator   Office: [phone] 24/7 VAD Pager: [phone]   Patient Instructions: 1. We taught you how to do weekly dressings today. Call us if you see increased drainage. If your site looks good in 2 weeks when we see you again we can teach you how to use the shower bag.   2. Increase your Lisinopril to 20 mg daily. For your current bottle take 4 pills once a day. When you pick up your new prescription only take ONE pill once a day.   3. Ok to drive. If your xray shows anything we  need to notify you about we will call you.   4. We will call you with your bloodwork today and Coumadin instructions.   5. Resend the referral for cardia rehab.   6. See you in 2 weeks!

## 2015-12-29 NOTE — Progress Notes (Signed)
Patient ID: Matthew Vaughan, male   DOB: 1955-12-31, 60 y.o.   MRN: [id number]    LVAD CLINIC NOTE Cardiology: Dr. Shirlee Latch  Matthew Vaughan is a 60 yo with history of nonischemic cardiomyopathy/chronic systolic CHF (EF 8% by MRI) and LV thrombus now s/p LVAD placement on 11/21/15 presents for VAD clinic followup.  Marland Kitchen    Here for f/u: He is doing great. Much more active. Can do almost anything he wants. Denies dyspnea, orthopnea, PND or edema. Starting to gain his weight back. Wants to drive.  Appetite is great.  No BRBPR or melena. No neuro symptoms. Taking meds. No problems with equipment.    LVAD interrogation reveals (reviewed with Dr Gala Romney):  Speed: 9400 Flow: 5.2 Power: 5.9 w PI: 5.8  Alarms: none Events: rare PI  Fixed speed: 9600 Low speed limit: 9000  Primary Controller: Replace back up battery in 24 months. Back up controller: Replace back up battery in 24 months.   I reviewed the LVAD parameters from oday, and compared the results to the patient's prior recorded data. No programming changes were made. The LVAD is functioning within specified parameters. LVAD interrogation was NEGATIVE for any significant power changes or alarms; RARE PI events/speed drops present which is consistent with the patient's history. Pt does have history of some lower PI's 3.0 range.   LVAD equipment check completed and is in good working order. Back-up equipment present. LVAD education done on emergency procedures and precautions and reviewed exit site care.    Labs (5/17): creatinine 1.05 => 0.91, HCT 30, LDH 235 => 294  PMH: 1. Chronic systolic CHF: Nonischemic cardiomyopathy.  Cause uncertain => not a heavy drinker, had one sister with what sounds like sudden death. Possible viral myocarditis.   - LHC/RHC (4/27): No angiographic CAD.  Mean RA 22, PA 57/34, mean PCWP 35, CI 1.39, PVR 2.6 WU. - Echo (4/17): Severely dilated LV with EF 10-15%.  - Cardiac MRI (4/17): EF 8%, severely dilated  LV, LV thrombus, mildly dilated RV with moderately decreased systolic function, patchy inferior LGE pattern could be consistent with prior myocarditis.  - Heartmate II LVAD placed 11/21/15.  2. LV thrombus.   FH: No history of premature CAD or cardiomyopathy.   SH: Divorced, had been living with Matthew Vaughan but now in [place] with his brother, worked as Art gallery manager.  Nonsmoker.  Rare ETOH.   ROS: All systems reviewed and negative except as per HPI.   Current Outpatient Prescriptions  Medication Sig Dispense Refill  . aspirin 81 MG chewable tablet Chew 1 tablet (81 mg total) by mouth daily. 30 tablet 6  . docusate sodium (COLACE) 100 MG capsule Take 100 mg by mouth 2 (two) times daily.    Marland Kitchen gabapentin (NEURONTIN) 300 MG capsule Take 1 capsule (300 mg total) by mouth 2 (two) times daily. 60 capsule 5  . lisinopril (PRINIVIL,ZESTRIL) 5 MG tablet Take 2 tablets (10 mg total) by mouth daily. 60 tablet 6  . oxyCODONE (OXY IR/ROXICODONE) 5 MG immediate release tablet Take 1-2 tablets (5-10 mg total) by mouth every 6 (six) hours as needed for severe pain. 240 tablet 0  . pantoprazole (PROTONIX) 40 MG tablet Take 1 tablet (40 mg total) by mouth daily. 30 tablet 6  . warfarin (COUMADIN) 5 MG tablet Take 1 tablet (5 mg total) by mouth daily. As directed by HF clinic. 45 tablet 6   No current facility-administered medications for this encounter.   BP 114/84 mmHg  Pulse 100  Ht 5'  11" (1.803 m)  Wt 169 lb 9.6 oz (76.93 kg)  BMI 23.66 kg/m2  SpO2 99%  Vital signs: HR: 91 Doppler MAP: 94 Auto cuff: 112/88 (96) O2 Sat: 95% Wt: 162 lbs Last weight: 155.4 lbs Ht: 5'11"  General: NAD. Looks good.  Neck: No JVD, no thyromegaly or thyroid nodule.  Lungs: Clear to auscultation bilaterally with normal respiratory effort. CV: LVAD hum.  No peripheral edema.  Abdomen: Soft, nontender, no hepatosplenomegaly, no distention.  Skin: Intact without lesions or rashes. Driveline site benign.  Neurologic:  Alert and oriented x 3.  Psych: Normal affect. Extremities: No clubbing or cyanosis.  HEENT: Normal.  Assessment/Plan: 1. HeartMate II LVAD: Patient is doing very well.  Pocket pain resolved.  LVAD parameters are stable. BP is a bit high.   - Increase lisinopril to 20 mg daily.   - Volume status looks good off diuretics. Can use prn - Continue ASA 81 + warfarin with INR goal 2-2.5. INR 1.7 today. Discussed with Pharmacy. LDH is good.  2. Chronic systolic CHF: Much improved since LVAD placement, now NYHA class II.  Pending referral to cardiac rehab. 3. LV thrombus: Noted on pre-op cMRI.  He is on warfarin.   I think he should be ready to drive soon but asked him to clear with Dr. Donata Clay to make sure no additional sternal precautions.   BensimhonReuel Vaughan 12/29/2015

## 2015-12-29 NOTE — Patient Instructions (Signed)
1. We taught you how to do weekly dressings today. Call us if you see increased drainage. If your site looks good in 2 weeks when we see you again we can teach you how to use the shower bag.   2. Increase your Lisinopril to 20 mg daily. For your current bottle take 4 pills once a day. When you pick up your new prescription only take ONE pill once a day.   3. Ok to drive. If your xray shows anything we need to notify you about we will call you.   4. We will call you with your bloodwork today and Coumadin instructions.   5. Resend the referral for cardia rehab.   6. See you in 2 weeks!

## 2016-01-06 ENCOUNTER — Ambulatory Visit (HOSPITAL_COMMUNITY): Payer: Self-pay | Admitting: Unknown Physician Specialty

## 2016-01-06 ENCOUNTER — Ambulatory Visit (HOSPITAL_COMMUNITY)
Admission: RE | Admit: 2016-01-06 | Discharge: 2016-01-06 | Disposition: A | Discharge status: Home / Self Care | Payer: Medicaid Other | Source: Ambulatory Visit | Attending: Cardiology | Admitting: Cardiology

## 2016-01-06 DIAGNOSIS — Z5181 Encounter for therapeutic drug level monitoring: Secondary | ICD-10-CM

## 2016-01-06 LAB — PROTIME-INR
INR: 2.04 — AB (ref 0.00–1.49)
PROTHROMBIN TIME: 22.9 s — AB (ref 11.6–15.2)

## 2016-01-09 MED FILL — WARFARIN SODIUM 5 MG TABLET: 5 | 30 days supply | Qty: 30 | Fill #1

## 2016-01-12 ENCOUNTER — Encounter (HOSPITAL_COMMUNITY): Payer: Self-pay

## 2016-01-13 ENCOUNTER — Other Ambulatory Visit (HOSPITAL_COMMUNITY): Payer: Self-pay | Admitting: Infectious Diseases

## 2016-01-13 ENCOUNTER — Ambulatory Visit (HOSPITAL_COMMUNITY): Payer: Self-pay | Admitting: Infectious Diseases

## 2016-01-13 ENCOUNTER — Ambulatory Visit (HOSPITAL_COMMUNITY)
Admission: RE | Admit: 2016-01-13 | Discharge: 2016-01-13 | Disposition: A | Discharge status: Home / Self Care | Payer: Medicaid Other | Source: Ambulatory Visit | Attending: Cardiology | Admitting: Cardiology

## 2016-01-13 DIAGNOSIS — I428 Other cardiomyopathies: Secondary | ICD-10-CM | POA: Diagnosis not present

## 2016-01-13 DIAGNOSIS — Z7901 Long term (current) use of anticoagulants: Secondary | ICD-10-CM

## 2016-01-13 DIAGNOSIS — Z7982 Long term (current) use of aspirin: Secondary | ICD-10-CM | POA: Insufficient documentation

## 2016-01-13 DIAGNOSIS — Z79899 Other long term (current) drug therapy: Secondary | ICD-10-CM | POA: Diagnosis not present

## 2016-01-13 DIAGNOSIS — Z86718 Personal history of other venous thrombosis and embolism: Secondary | ICD-10-CM | POA: Diagnosis not present

## 2016-01-13 DIAGNOSIS — Z95811 Presence of heart assist device: Secondary | ICD-10-CM

## 2016-01-13 DIAGNOSIS — I5022 Chronic systolic (congestive) heart failure: Secondary | ICD-10-CM | POA: Insufficient documentation

## 2016-01-13 LAB — BASIC METABOLIC PANEL
ANION GAP: 8 (ref 5–15)
BUN: 10 mg/dL (ref 6–20)
CALCIUM: 9.5 mg/dL (ref 8.9–10.3)
CHLORIDE: 105 mmol/L (ref 101–111)
CO2: 23 mmol/L (ref 22–32)
Creatinine, Ser: 1.11 mg/dL (ref 0.61–1.24)
GFR calc Af Amer: >60 mL/min (ref >60)
GFR calc non Af Amer: >60 mL/min (ref >60)
GLUCOSE: 106 mg/dL — AB (ref 65–99)
POTASSIUM: 4.2 mmol/L (ref 3.5–5.1)
Sodium: 136 mmol/L (ref 135–145)

## 2016-01-13 LAB — CBC
HEMATOCRIT: 36.3 % — AB (ref 39.0–52.0)
HEMOGLOBIN: 11.2 g/dL — AB (ref 13.0–17.0)
MCH: 31.4 pg (ref 26.0–34.0)
MCHC: 30.9 g/dL (ref 30.0–36.0)
MCV: 101.7 fL — AB (ref 78.0–100.0)
Platelets: 361 10*3/uL (ref 150–400)
RBC: 3.57 MIL/uL — AB (ref 4.22–5.81)
RDW: 15.3 % (ref 11.5–15.5)
WBC: 4.7 10*3/uL (ref 4.0–10.5)

## 2016-01-13 LAB — PROTIME-INR
INR: 2.34 — ABNORMAL HIGH (ref 0.00–1.49)
Prothrombin Time: 25.4 seconds — ABNORMAL HIGH (ref 11.6–15.2)

## 2016-01-13 LAB — LACTATE DEHYDROGENASE: LDH: 281 U/L — ABNORMAL HIGH (ref 98–192)

## 2016-01-13 MED ORDER — LISINOPRIL 20 MG PO TABS: 20.0000 mg | ORAL_TABLET | Freq: Every day | ORAL | Status: DC

## 2016-01-13 MED FILL — LISINOPRIL 20 MG TABLET: 20 | 30 days supply | Qty: 30 | Fill #0 | Status: TO

## 2016-01-13 NOTE — Patient Instructions (Addendum)
Take two more 5 mg tabs of lisinopril (10 mg today)   Keep your weights to a maximum of 20 lbs. Avoid push ups, and sit ups for at least 3 months to allow your sternum and drive line to heal.   Here are some tips for washing:  . Avoid getting the driveline exit site dressing wet, and consider planning bathing times around exit site dressing changes. . Use only the shower bag we gave you to shower with and don't get creative with your equipment to shower. Water and electricity do not mix and will cause your pump to stop.   . Sit on a chair or bathing stool in the bathtub or shower stall, and use a basin of warm water and washcloth or sponge to wash. Or, wash at the sink while standing on a towel, so as not to get the floor or bath rug wet. . To wash your hair, try using a hand-held shower wand or sprayer while standing over the kitchen sink or bathtub. . Be sure that all floor surfaces are dry when walking around after bathing, to avoid slipping. . Do not use powder around the exit site dressing. Anytime your dressing appears wet CHANGE IT!  Re-sent your lisinopril [facility] to [facility]. Take one 20 mg pill every day when you pick up your prescription.   Please if you have any questions about your medicines call the VAD office at [phone] between 8 - 4:30pm.   Return to see [place] in 2 weeks to see VAD RN for blood pressure check. You can try to take it at home if you have a machine.   Back to see VAD doctor in 1 month.

## 2016-01-13 NOTE — Progress Notes (Addendum)
Symptom  Yes  No  Details   Angina    x Activity:   Claudication    x How far:   Syncope     x When:   Stroke    x   Orthopnea     How many pillows:  for comfort  PND    x How often:  CPAP    How many hrs:   Pedal edema    x   Abd fullness    x   N&V    x Appetite improved   Diaphoresis    x When:  Bleeding   x No BPBPR or in urine  Urine color   Light yellow  SOB    x Activity:   Palpitations    x When:  ICD shock    x   Hospitalizations     x When/where/why:  ED visit    x When/where/why:  Other MD    x When/who/why:  Activity    Walking 1/2 - 1 mile daily  Fluid    < 2 L daily   Diet    Low sodium. Reports he is having some weight loss despite adequate appetite.    Vital signs: HR: 91 Doppler MAP:120 Auto cuff:126/101 (112) O2 Sat: 96 % Wt: 166 lbs Last weight: 166 lbs Ht: 5'11"  LVAD interrogation reveals (reviewed with Dr Shirlee Latch):  Speed: 9400 Flow: 5.6 Power: 5.9 w PI: 5.9  Alarms: none Events: rare PI  Fixed speed: 9600 Low speed limit: 9000  Primary Controller: Replace back up battery in 24 months. Back up controller: Replace back up battery in 28 months.   I reviewed the LVAD parameters from oday, and compared the results to the patient's prior recorded data. No programming changes were made. The LVAD is functioning within specified parameters. LVAD interrogation was NEGATIVE for any significant power changes or alarms; RARE PI events/speed drops present which is consistent with the patient's history. Pt does have history of some lower PI's 3.0 range.   LVAD equipment check completed and is in good working order. Back-up equipment present. LVAD education done on emergency procedures and precautions and reviewed exit site care.   LVAD exit site:  Gauze dressing dry and  intact. No drainage noted. The velour is fully implanted at exit site with near complete tissue ingrowth. Dressing change using sterile technique, no erythema, drainage, or foul odor. Some burning with Chlora prep sticks. Patient reports he is performing his own dressing changes. Advanced to weekly kits today. Taught him how to perform these dressing changes and he feels comfortable with instructions. Will RTC in 2 weeks to reassess progress of ingrowth and consider teaching him how to use shower equipment at that time.   Encounter Details: Mr. Rardin is 7 weeks post-op today. Reviewed CXR with Dr. Donata Clay and looks good to continue driving with 20 lb lift restriction until 3 months post procedure. Would like to discuss more about restrictions for working out today--advised to avoid things like bench press, push ups where there is direct strain on the sternal incision. Driving is going well. Denies any dizziness, orthostasis, dyspnea. Walking 1 mile daily and would like to do weights as discussed above.   Pt denies any alarms or VAD equipment issues. Reviewed home log he brought today. Primary caregiver (mother) did not accompany pt.   INR 2.34 --see anticoag note. Goal 2 - 2.5. 81 mg ASA.     LDH 281 --Stable within established baseline since implant.  Hgb 11.2 stable since last visit and on current anticoagulation.  MAP 112 - 120 with doppler pressure reflecting true MAP today. Lisinopril was increased last time to 20 mg however he was unable to get Rx (sent to Pharmacy in [place]) and he was only taking 2 of the 5 mg pills instead of the instructed 4 pills. Resent Rx today to [facility]. He had his pill bottle with him today and he took an extra 10 mg today to make his dose. RTC in 2 weeks to recheck BP.    RTC in 2 weeks for BP check and 1 month for MD visit.   Rexene Alberts, RN VAD Coordinator   Office: [phone] 24/7 VAD Pager: [phone]   Patient Instructions: Re-sent your  lisinopril today to [facility]. Take one 20 mg pill every day when you pick up your prescription.   Please if you have any questions about your medicines call the VAD office at [phone] between 8 - 4:30pm.   Return to see [place] in 2 weeks to see VAD RN for blood pressure check. You can try to take it at home if you have a machine.   Back to see VAD doctor in 1 month.   Patient ID: Chester Sibert, male   DOB: 1955/12/09, 60 y.o.   MRN: [id number]    LVAD CLINIC NOTE Cardiology: Dr. Shirlee Latch  Mr. Wedel is a 60 yo with history of nonischemic cardiomyopathy/chronic systolic CHF (EF 8% by MRI) and LV thrombus now s/p LVAD placement on 11/21/15 presents for VAD clinic followup.      Here for f/u: He continues to do very well. Much more active. Can do almost anything he wants. Denies dyspnea, orthopnea, PND or edema. Starting to gain his weight back. Back to driving.  Appetite is great.  No BRBPR or melena. No neuro symptoms. Taking meds but MAP is elevated. No problems with equipment.   LVAD interrogation  See nurse's note above.  Labs (5/17): creatinine 1.05 => 0.91, HCT 30, LDH 235 => 294 Labs (6/17): K 3.7, creatinine 1.17, LDL 262, HCT 36.3  PMH: 1. Chronic systolic CHF: Nonischemic cardiomyopathy.  Cause uncertain => not a heavy drinker, had one sister with what sounds like sudden death. Possible viral myocarditis.   - LHC/RHC (4/27): No angiographic CAD.  Mean RA 22, PA 57/34, mean PCWP 35, CI 1.39, PVR 2.6 WU. - Echo (4/17): Severely dilated LV with EF 10-15%.  - Cardiac MRI (4/17): EF 8%, severely dilated LV, LV thrombus, mildly dilated RV with moderately decreased systolic function, patchy inferior LGE pattern could be consistent with prior myocarditis.  - Heartmate II LVAD placed 11/21/15.  2. LV thrombus.   FH: No history of premature CAD or cardiomyopathy.   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   Nonsmoker.  Rare ETOH.   ROS: All systems reviewed and negative except as per HPI.   Current Outpatient Prescriptions  Medication Sig Dispense Refill  . aspirin 81 MG chewable tablet Chew 1 tablet (81 mg total) by mouth daily. 30 tablet 6  . docusate sodium (COLACE) 100 MG capsule Take 100 mg by mouth 2 (two) times daily.    . pantoprazole (PROTONIX) 40 MG tablet Take 1 tablet (40 mg total) by mouth daily. 30 tablet 6  . warfarin (COUMADIN) 5 MG tablet Take 1 tablet (5 mg total) by mouth daily. As directed by HF clinic. 45 tablet 6  .  gabapentin (NEURONTIN) 300 MG capsule Take 1 capsule (300 mg total) by mouth 2 (two) times daily. (Patient not taking: Reported on 01/13/2016) 60 capsule 5  . lisinopril (PRINIVIL,ZESTRIL) 20 MG tablet Take 1 tablet (20 mg total) by mouth daily. 30 tablet 3  . oxyCODONE (OXY IR/ROXICODONE) 5 MG immediate release tablet Take 1-2 tablets (5-10 mg total) by mouth every 6 (six) hours as needed for severe pain. (Patient not taking: Reported on 01/13/2016) 240 tablet 0   No current facility-administered medications for this encounter.   There were no vitals taken for this visit.  Vital signs: HR: 91 Auto cuff:126/101 (112) O2 Sat: 96 % Wt: 166 lbs Last weight: 166 lbs Ht: 5'11"  General: NAD. Looks good.  Neck: No JVD, no thyromegaly or thyroid nodule.  Lungs: Clear to auscultation bilaterally with normal respiratory effort. CV: LVAD hum.  No peripheral edema.  Abdomen: Soft, nontender, no hepatosplenomegaly, no distention.  Skin: Intact without lesions or rashes. Driveline site benign.  Neurologic: Alert and oriented x 3.  Psych: Normal affect. Extremities: No clubbing or cyanosis.  HEENT: Normal.  Assessment/Plan: 1. HeartMate II LVAD: Patient is doing very well.  LVAD parameters are stable. BP remains high.   - Increase lisinopril to 20 mg daily today, has been taking 10 mg daily.    - Volume status looks good off diuretics. Can use prn - Continue  ASA 81 + warfarin with INR goal 2-2.5.  LDH has been stable.  2. Chronic systolic CHF: Much improved since LVAD placement, now NYHA class II.  Start cardiac rehab. 3. LV thrombus: Noted on pre-op cMRI.  He is on warfarin.   Return in 2 wks for BP check and INR, will come back to clinic in 1 month.   [name] 01/14/2016

## 2016-01-20 ENCOUNTER — Encounter (HOSPITAL_COMMUNITY): Payer: Self-pay | Admitting: Infectious Diseases

## 2016-01-25 ENCOUNTER — Other Ambulatory Visit (HOSPITAL_COMMUNITY): Payer: Self-pay | Admitting: *Deleted

## 2016-01-25 ENCOUNTER — Other Ambulatory Visit: Payer: Self-pay | Admitting: Infectious Diseases

## 2016-01-25 DIAGNOSIS — Z95811 Presence of heart assist device: Secondary | ICD-10-CM

## 2016-01-25 DIAGNOSIS — Z7901 Long term (current) use of anticoagulants: Secondary | ICD-10-CM

## 2016-01-25 MED ORDER — WARFARIN SODIUM 5 MG PO TABS: ORAL_TABLET | ORAL | Status: DC

## 2016-01-25 MED FILL — WARFARIN SODIUM 5 MG TABLET: 5 | 30 days supply | Qty: 60 | Fill #0 | Status: TO

## 2016-01-25 NOTE — Telephone Encounter (Signed)
Call received from Ms. Bacigalupi re: warfarin for [employer]. Unable to pick up med at this time d/t "being too soon." reviewed Rx and dose has change. Resent medication to [facility] to reflect change so he may have it filled today.   Rexene Alberts, RN VAD Coordinator   Office: [phone] 24/7 Emergency VAD Pager: [phone]

## 2016-01-26 ENCOUNTER — Ambulatory Visit (HOSPITAL_COMMUNITY): Payer: Self-pay | Admitting: Unknown Physician Specialty

## 2016-01-26 ENCOUNTER — Ambulatory Visit (HOSPITAL_COMMUNITY)
Admission: RE | Admit: 2016-01-26 | Discharge: 2016-01-26 | Disposition: A | Discharge status: Home / Self Care | Payer: Medicaid Other | Source: Ambulatory Visit | Attending: Cardiology | Admitting: Cardiology

## 2016-01-26 ENCOUNTER — Other Ambulatory Visit (HOSPITAL_COMMUNITY): Payer: Self-pay | Admitting: Unknown Physician Specialty

## 2016-01-26 VITALS — BP 135/107 | HR 104 | Wt 169.8 lb

## 2016-01-26 DIAGNOSIS — I5043 Acute on chronic combined systolic (congestive) and diastolic (congestive) heart failure: Secondary | ICD-10-CM

## 2016-01-26 DIAGNOSIS — I10 Essential (primary) hypertension: Secondary | ICD-10-CM | POA: Diagnosis present

## 2016-01-26 DIAGNOSIS — I5023 Acute on chronic systolic (congestive) heart failure: Secondary | ICD-10-CM | POA: Diagnosis not present

## 2016-01-26 DIAGNOSIS — Z95811 Presence of heart assist device: Secondary | ICD-10-CM

## 2016-01-26 DIAGNOSIS — Z7901 Long term (current) use of anticoagulants: Secondary | ICD-10-CM

## 2016-01-26 LAB — CBC
HEMATOCRIT: 34.5 % — AB (ref 39.0–52.0)
Hemoglobin: 11 g/dL — ABNORMAL LOW (ref 13.0–17.0)
MCH: 31.8 pg (ref 26.0–34.0)
MCHC: 31.9 g/dL (ref 30.0–36.0)
MCV: 99.7 fL (ref 78.0–100.0)
Platelets: 344 10*3/uL (ref 150–400)
RBC: 3.46 MIL/uL — ABNORMAL LOW (ref 4.22–5.81)
RDW: 14.4 % (ref 11.5–15.5)
WBC: 4.4 10*3/uL (ref 4.0–10.5)

## 2016-01-26 LAB — PROTIME-INR
INR: 3.4 — ABNORMAL HIGH (ref 0.00–1.49)
Prothrombin Time: 33.7 seconds — ABNORMAL HIGH (ref 11.6–15.2)

## 2016-01-26 NOTE — Progress Notes (Addendum)
Pt presents to clinic for a nurse visit to check BP. See vital signs. Pt states that he did not take his morning medications. But ensures me that he takes his medications everyday. Pt was educated that he must take his medicine everyday as prescribed. Pt states that he is still having pain at his pump site and his abdomen. Pt is asking for Oxycodone. Pt was instructed to take the Gabapentin that was described to him regularly for his pump pain/abdominal pain. Pt states the Gabapentin is constipating. Pt was instructed to take an extra colace everyday. Pt states that he is not living in [place] at his home or with his brother Arlys John. Pt states that he is residing in [place]. Pt was instructed that he will need to get a phone so that the VAD clinic can contact him with instructions for Coumadin and other instructions. Pt states that he will go get a phone today. Dr. Shirlee Latch ordered a cxray but the pt refused stating he was in a hurry. Labs were drawn today. We will follow up with the patient at his scheduled appt in 2 weeks.   LVAD interrogation reveals (reviewed with Dr Shirlee Latch):  Speed: 9400 Flow: 5.1 Power: 6.0 w PI: 5.8  Alarms: none Events: rare PI  Fixed speed: 9600 Low speed limit: 9000  Primary Controller: Replace back up battery in 24 months. Back up controller: Replace back up battery in 28 months.   I reviewed the LVAD parameters from oday, and compared the results to the patient's prior recorded data. No programming changes were made. The LVAD is functioning within specified parameters. LVAD interrogation was NEGATIVE for any significant power changes or alarms; RARE PI events/speed drops present which is consistent with the patient's history. Pt does have history of some lower PI's 3.0 range.   LVAD equipment check completed and is in good working order. Back-up equipment present. LVAD education done on emergency procedures and precautions and reviewed exit site care.

## 2016-02-03 ENCOUNTER — Ambulatory Visit (HOSPITAL_COMMUNITY): Payer: Self-pay | Admitting: Unknown Physician Specialty

## 2016-02-03 ENCOUNTER — Other Ambulatory Visit (HOSPITAL_COMMUNITY): Payer: Self-pay | Admitting: Unknown Physician Specialty

## 2016-02-03 ENCOUNTER — Ambulatory Visit (HOSPITAL_COMMUNITY)
Admission: RE | Admit: 2016-02-03 | Discharge: 2016-02-03 | Disposition: A | Discharge status: Home / Self Care | Payer: Medicaid Other | Source: Ambulatory Visit | Attending: Cardiology | Admitting: Cardiology

## 2016-02-03 DIAGNOSIS — Z95811 Presence of heart assist device: Secondary | ICD-10-CM

## 2016-02-03 DIAGNOSIS — I5023 Acute on chronic systolic (congestive) heart failure: Secondary | ICD-10-CM | POA: Diagnosis not present

## 2016-02-03 DIAGNOSIS — Z7901 Long term (current) use of anticoagulants: Secondary | ICD-10-CM | POA: Diagnosis not present

## 2016-02-03 LAB — PROTIME-INR
INR: 2.62 — AB (ref 0.00–1.49)
PROTHROMBIN TIME: 27.7 s — AB (ref 11.6–15.2)

## 2016-02-06 LAB — DRUG SCREEN 10 W/CONF, SERUM
AMPHETAMINES, IA: NEGATIVE ng/mL
BARBITURATES, IA: NEGATIVE ug/mL
Benzodiazepines, IA: NEGATIVE ng/mL
COCAINE & METABOLITE, IA: NEGATIVE ng/mL
Methadone, IA: NEGATIVE ng/mL
Opiates, IA: NEGATIVE ng/mL
Oxycodones, IA: NEGATIVE ng/mL
PHENCYCLIDINE, IA: NEGATIVE ng/mL
PROPOXYPHENE, IA: NEGATIVE ng/mL
THC(Marijuana) Metabolite, IA: NEGATIVE ng/mL

## 2016-02-14 ENCOUNTER — Other Ambulatory Visit (HOSPITAL_COMMUNITY): Payer: Self-pay | Admitting: Unknown Physician Specialty

## 2016-02-14 ENCOUNTER — Ambulatory Visit (HOSPITAL_COMMUNITY): Payer: Self-pay | Admitting: Unknown Physician Specialty

## 2016-02-14 ENCOUNTER — Ambulatory Visit (HOSPITAL_COMMUNITY)
Admission: RE | Admit: 2016-02-14 | Discharge: 2016-02-14 | Disposition: A | Discharge status: Home / Self Care | Payer: Medicaid Other | Source: Ambulatory Visit | Attending: Cardiology | Admitting: Cardiology

## 2016-02-14 DIAGNOSIS — I5022 Chronic systolic (congestive) heart failure: Secondary | ICD-10-CM | POA: Diagnosis not present

## 2016-02-14 DIAGNOSIS — Z95811 Presence of heart assist device: Secondary | ICD-10-CM

## 2016-02-14 DIAGNOSIS — Z79899 Other long term (current) drug therapy: Secondary | ICD-10-CM | POA: Diagnosis not present

## 2016-02-14 DIAGNOSIS — Z7982 Long term (current) use of aspirin: Secondary | ICD-10-CM | POA: Insufficient documentation

## 2016-02-14 DIAGNOSIS — I428 Other cardiomyopathies: Secondary | ICD-10-CM | POA: Insufficient documentation

## 2016-02-14 DIAGNOSIS — Z7901 Long term (current) use of anticoagulants: Secondary | ICD-10-CM

## 2016-02-14 DIAGNOSIS — I110 Hypertensive heart disease with heart failure: Secondary | ICD-10-CM | POA: Diagnosis not present

## 2016-02-14 LAB — BASIC METABOLIC PANEL
Anion gap: 4 — ABNORMAL LOW (ref 5–15)
BUN: 10 mg/dL (ref 6–20)
CALCIUM: 9.3 mg/dL (ref 8.9–10.3)
CO2: 26 mmol/L (ref 22–32)
CREATININE: 1.09 mg/dL (ref 0.61–1.24)
Chloride: 107 mmol/L (ref 101–111)
GFR calc non Af Amer: >60 mL/min (ref >60)
GLUCOSE: 95 mg/dL (ref 65–99)
Potassium: 4.3 mmol/L (ref 3.5–5.1)
Sodium: 137 mmol/L (ref 135–145)

## 2016-02-14 LAB — CBC
HCT: 35.7 % — ABNORMAL LOW (ref 39.0–52.0)
Hemoglobin: 11.2 g/dL — ABNORMAL LOW (ref 13.0–17.0)
MCH: 32 pg (ref 26.0–34.0)
MCHC: 31.4 g/dL (ref 30.0–36.0)
MCV: 102 fL — ABNORMAL HIGH (ref 78.0–100.0)
PLATELETS: 291 10*3/uL (ref 150–400)
RBC: 3.5 MIL/uL — ABNORMAL LOW (ref 4.22–5.81)
RDW: 14 % (ref 11.5–15.5)
WBC: 3.7 10*3/uL — ABNORMAL LOW (ref 4.0–10.5)

## 2016-02-14 LAB — PROTIME-INR
INR: 3.52
PROTHROMBIN TIME: 36.2 s — AB (ref 11.4–15.2)

## 2016-02-14 LAB — LACTATE DEHYDROGENASE: LDH: 243 U/L — ABNORMAL HIGH (ref 98–192)

## 2016-02-14 MED ORDER — BLOOD PRESSURE CUFF MISC: 1.0000 [IU] | Freq: Every day | 0 refills | Status: AC

## 2016-02-14 NOTE — Patient Instructions (Signed)
1. Obtain BP machine with prescription provided.  2. Obtain daily BP log. VAD coordinator will call you in a week for results. 3. Return to clinic in 2 months VAD clinic.

## 2016-02-14 NOTE — Progress Notes (Addendum)
Symptom  Yes  No  Details   Angina    x Activity:   Claudication    x How far:   Syncope     x When:   Stroke    x   Orthopnea     How many pillows:  for comfort  PND    x How often:  CPAP    How many hrs:   Pedal edema    x   Abd fullness    x   N&V    x Appetite improved   Diaphoresis    x When:  Bleeding   x No BPBPR or in urine  Urine color   Light yellow  SOB  x   Activity: walking up hill  Palpitations    x When:  ICD shock    x   Hospitalizations     x When/where/why:  ED visit    x When/where/why:  Other MD    x When/who/why:  Activity    Walking 1/2 - 1 mile daily  Fluid    < 2 L daily   Diet       Vital signs: HR: 88 Doppler MAP:124 Auto cuff:127/101 (114) O2 Sat: 99 % Wt: 173.2 lbs Last weight: 166 lbs Ht: 5'11"  LVAD interrogation reveals (reviewed with Dr Shirlee Latch):  Speed: 9400 Flow: 4.6 Power: 5.5 w PI: 6.2  Alarms: none Events: rare PI  Fixed speed: 9600 Low speed limit: 9000  Primary Controller: Replace back up battery in 25 months. Back up controller: Replace back up battery in 28 months.   I reviewed the LVAD parameters from today, and compared the results to the patient's prior recorded data. No programming changes were made. The LVAD is functioning within specified parameters. LVAD interrogation was NEGATIVE for any significant power changes or alarms; RARE PI events/speed drops present which is consistent with the patient's history. Pt does have history of some lower PI's 3.0 range.   LVAD equipment check completed and is in good working order. Back-up equipment present. LVAD education done on emergency procedures and precautions and reviewed exit site care.    Controller Change Out  HeartMate II System Controller SW Version 7.29 upgrade performed in the  hospital today to accommodate new recommendations from [employer]. Consent reviewed and signed. Tem Tech representative Scharlene Corn) performed software upgrade. Self-test was performed and passed on upgraded controller prior to the exchange. Bedside nurse performed elective controller exchange with VAD Coordinator present. Patient tolerated controller exchange well and was asymptomatic. Pump restarted as expected with stable VAD parameters on correct prescribed speed of 9000 / 8400 rpms.   Now Current:   Back up Controller: [id number], Exp: 12/13/2016)  Primary Controller: [id number], Exp: 07/15/2016  LVAD exit site:  Drive line is being maintained by the patient. Denies any fevers, chills, drainage or foul odor. VAD dressing removed and site care performed using sterile technique. Drive line exit site cleaned with Chlora prep applicators x 2, allowed to dry, and Sorbaview dressing with biopatch on exit site applied. Exit site with complete tissue ingrowth, the velour is fully implanted at exit site. No redness, tenderness, drainage, or foul odor noted. New anchor device attached. Driveline dressing is being changed weekly per sterile technique.   Encounter Details: Mr. Lewers is 2 months post-op today. Would like to discuss more about heart transplant and his options. Driving is going well. Denies any dizziness, orthostasis, dyspnea. Walking 1 mile daily and would like to do small weight. He  is going to the gym daily.    Pt denies any alarms or VAD equipment issues.  INR 3.52 --see anticoag note. Goal 2 - 2.5. 81 mg ASA.     LDH 243 --Stable within established baseline since implant.   Hgb 11.2 stable since last visit and on current anticoagulation.  MAP 112 - 124 with doppler pressure reflecting true MAP today. Pt did not take his Lisinpril today. He states that he transferred his medications to a pharmacy in [place] and they did not have Lisinpril  ready this morning for pick up. Pt was supplied with a prescription for a blood pressure cuff. Pt was instructed to create a daily BP log, we will call the Pt in 1 week to see what his BP is running.  RTC in 2 months for MD visit.   Carlton Adam, RN VAD Coordinator   Office: [phone] 24/7 VAD Pager: [phone]    LVAD CLINIC NOTE Cardiology: Dr. Shirlee Latch  Mr. Malbrough is a 60 yo with history of nonischemic cardiomyopathy/chronic systolic CHF (EF 8% by MRI) and LV thrombus now s/p LVAD placement on 11/21/15 presents for VAD clinic followup.      Here for followup. He continues to do very well. Much more active. Can do almost anything he wants. Denies dyspnea, orthopnea, PND or edema. Starting to gain his weight back. Back to driving.  Appetite is great.  No BRBPR or melena. No neuro symptoms. Doing some light weights.  MAP high today but did not take lisinopril yet today.  No problems with equipment.  He is now living with his son in [place] and interested in working towards a cardiac transplant at [facility].   LVAD interrogation  See nurse's note above.  Labs (5/17): creatinine 1.05 => 0.91, HCT 30, LDH 235 => 294 Labs (6/17): K 3.7, creatinine 1.17, LDL 262 => 281, HCT 36.3, INR 2.6  PMH: 1. Chronic systolic CHF: Nonischemic cardiomyopathy.  Cause uncertain => not a heavy drinker, had one sister with what sounds like sudden death. Possible viral myocarditis.   - LHC/RHC (4/27): No angiographic CAD.  Mean RA 22, PA 57/34, mean PCWP 35, CI 1.39, PVR 2.6 WU. - Echo (4/17): Severely dilated LV with EF 10-15%.  - Cardiac MRI (4/17): EF 8%, severely dilated LV, LV thrombus, mildly dilated RV with moderately decreased systolic function, patchy inferior LGE pattern could be consistent with prior myocarditis.  - Heartmate II LVAD placed 11/21/15.  2. LV thrombus.  3. HTN  FH: No history of premature CAD or cardiomyopathy.   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  Nonsmoker.  Rare ETOH.   ROS: All systems reviewed and negative except as per HPI.   Current Outpatient Prescriptions  Medication Sig Dispense Refill  . aspirin 81 MG chewable tablet Chew 1 tablet (81 mg total) by mouth daily. 30 tablet 6  . docusate sodium (COLACE) 100 MG capsule Take 100 mg by mouth daily as needed.     . gabapentin (NEURONTIN) 300 MG capsule Take 1 capsule (300 mg total) by mouth 2 (two) times daily. 60 capsule 5  . lisinopril (PRINIVIL,ZESTRIL) 20 MG tablet Take 1 tablet (20 mg total) by mouth daily. 30 tablet 3  . warfarin (COUMADIN) 5 MG tablet Take as directed by the LVAD Clinic 60 tablet 6  . Blood Pressure Monitoring (BLOOD PRESSURE CUFF) MISC 1 Units by Does not apply route daily. 1 each 0  .  pantoprazole (PROTONIX) 40 MG tablet Take 1 tablet (40 mg total) by mouth daily. (Patient not taking: Reported on 02/14/2016) 30 tablet 6   No current facility-administered medications for this encounter.    There were no vitals taken for this visit.  Vital signs: HR 76 MAP 112  General: NAD. Looks good.  Neck: No JVD, no thyromegaly or thyroid nodule.  Lungs: Clear to auscultation bilaterally with normal respiratory effort. CV: LVAD hum.  No peripheral edema.  Abdomen: Soft, nontender, no hepatosplenomegaly, no distention.  Skin: Intact without lesions or rashes. Driveline site benign.  Neurologic: Alert and oriented x 3.  Psych: Normal affect. Extremities: No clubbing or cyanosis.  HEENT: Normal.  Assessment/Plan: 1. HeartMate II LVAD: Patient is doing very well.  LVAD parameters are stable. BP high today but did not take lisinopril.   - Continue lisinopril 20 mg daily. He will get a BP cuff and check daily.  He will call with readings (after taking lisinopril) after a week.  I urged him to make sure to take lisinopril regularly.      - Volume status looks good off diuretics. Can use Lasix prn - Continue ASA 81 + warfarin with INR goal  2-2.5.  LDH has been stable.  - Eventual referral to [facility] for transplant evaluation.  2. Chronic systolic CHF: Much improved since LVAD placement, now NYHA class II.  He is exercising regularly.  3. LV thrombus: Noted on pre-op cMRI.  He is on warfarin.   Followup in 2 months.    [name] 02/14/2016

## 2016-02-21 ENCOUNTER — Ambulatory Visit (HOSPITAL_COMMUNITY): Payer: Self-pay | Admitting: Infectious Diseases

## 2016-02-21 ENCOUNTER — Telehealth: Payer: Self-pay | Admitting: Physician Assistant

## 2016-02-21 DIAGNOSIS — Z7901 Long term (current) use of anticoagulants: Secondary | ICD-10-CM

## 2016-02-21 LAB — PROTIME-INR: INR: 1.5 — AB (ref <=1.1)

## 2016-02-21 MED ORDER — ENOXAPARIN SODIUM 40 MG/0.4ML ~~LOC~~ SOLN: 40.0000 mg | Freq: Two times a day (BID) | SUBCUTANEOUS | 1 refills | Status: DC

## 2016-02-21 NOTE — Telephone Encounter (Signed)
Paged by answering service. The patient's pharmacy did not filled his Lovenox that was prescribed today. Spoken with pharmacy staff--> patient insurance only covers brand name Lovenox and did not have in stock. The patient able to pick it up at another [facility] @ [place], [place]. Updated patient he will pick up Rx today.

## 2016-02-22 ENCOUNTER — Telehealth (HOSPITAL_COMMUNITY): Payer: Self-pay | Admitting: *Deleted

## 2016-02-22 NOTE — Telephone Encounter (Signed)
Pt called VAD office to confirm today's dose of coumadin should be 10 mg. Also, wanted to confirm if he could "stop shots" today (pt referring to Lovenox injections).   Instructed pt to take Coumadin 10 mg today then resume 7.5 mg daily except 10 mg on Tues/Thurs; also instructed him to continue Lovenox until INR re-check Friday, 02/24/16. Explained his INR must be 1.8 or higher before he can safely stop the Lovenox. Pt verbalized understanding of same.

## 2016-02-24 ENCOUNTER — Ambulatory Visit (HOSPITAL_COMMUNITY): Payer: Self-pay | Admitting: *Deleted

## 2016-02-24 LAB — PROTIME-INR: INR: 2.1 — AB (ref <=1.1)

## 2016-03-02 ENCOUNTER — Ambulatory Visit (HOSPITAL_COMMUNITY): Payer: Self-pay | Admitting: Unknown Physician Specialty

## 2016-03-02 DIAGNOSIS — Z95811 Presence of heart assist device: Secondary | ICD-10-CM

## 2016-03-02 LAB — POCT INR: INR: 2

## 2016-03-06 ENCOUNTER — Telehealth (HOSPITAL_COMMUNITY): Payer: Self-pay | Admitting: Pharmacist

## 2016-03-06 NOTE — Telephone Encounter (Signed)
Enoxaparin 40 mg BID PA approved by [place] Medicaid through 03/24/16.   Tyler Deis. Bonnye Fava, PharmD, BCPS, CPP Clinical Pharmacist Pager: [phone] Phone: [phone] 03/06/2016 2:03 PM

## 2016-03-09 ENCOUNTER — Ambulatory Visit (HOSPITAL_COMMUNITY): Payer: Self-pay | Admitting: Infectious Diseases

## 2016-03-09 ENCOUNTER — Telehealth (HOSPITAL_COMMUNITY): Payer: Self-pay | Admitting: *Deleted

## 2016-03-09 LAB — PROTIME-INR: INR: 1.9 — AB (ref <=1.1)

## 2016-03-09 NOTE — Telephone Encounter (Signed)
Open in error

## 2016-03-23 ENCOUNTER — Ambulatory Visit (HOSPITAL_COMMUNITY): Payer: Self-pay | Admitting: Unknown Physician Specialty

## 2016-03-23 DIAGNOSIS — Z95811 Presence of heart assist device: Secondary | ICD-10-CM

## 2016-03-23 LAB — PROTIME-INR: INR: 2.3 — AB (ref 0.9–1.1)

## 2016-04-06 ENCOUNTER — Ambulatory Visit: Payer: Self-pay | Admitting: Unknown Physician Specialty

## 2016-04-06 DIAGNOSIS — Z95811 Presence of heart assist device: Secondary | ICD-10-CM

## 2016-04-06 LAB — PROTIME-INR: INR: 1.8 — AB (ref 0.9–1.1)

## 2016-04-12 ENCOUNTER — Ambulatory Visit (HOSPITAL_COMMUNITY): Payer: Self-pay | Admitting: Infectious Diseases

## 2016-04-12 LAB — PROTIME-INR: INR: 1.8 — AB (ref <=1.1)

## 2016-04-12 NOTE — Progress Notes (Signed)
Left message to call back to number. No specifics in message re: coumadin instructions as it was not an identified VM box.

## 2016-04-16 ENCOUNTER — Other Ambulatory Visit (HOSPITAL_COMMUNITY): Payer: Self-pay | Admitting: Unknown Physician Specialty

## 2016-04-16 DIAGNOSIS — Z7901 Long term (current) use of anticoagulants: Secondary | ICD-10-CM

## 2016-04-16 DIAGNOSIS — Z95811 Presence of heart assist device: Secondary | ICD-10-CM

## 2016-04-17 ENCOUNTER — Other Ambulatory Visit (HOSPITAL_COMMUNITY): Payer: Self-pay | Admitting: Unknown Physician Specialty

## 2016-04-17 ENCOUNTER — Ambulatory Visit (HOSPITAL_COMMUNITY): Payer: Self-pay | Admitting: Unknown Physician Specialty

## 2016-04-17 ENCOUNTER — Ambulatory Visit (HOSPITAL_COMMUNITY)
Admission: RE | Admit: 2016-04-17 | Discharge: 2016-04-17 | Disposition: A | Discharge status: Home / Self Care | Payer: Medicaid Other | Source: Ambulatory Visit | Attending: Cardiology | Admitting: Cardiology

## 2016-04-17 VITALS — BP 122/0 | HR 87 | Resp 18 | Wt 184.0 lb

## 2016-04-17 DIAGNOSIS — Z95811 Presence of heart assist device: Secondary | ICD-10-CM | POA: Insufficient documentation

## 2016-04-17 DIAGNOSIS — I5022 Chronic systolic (congestive) heart failure: Secondary | ICD-10-CM | POA: Insufficient documentation

## 2016-04-17 DIAGNOSIS — I429 Cardiomyopathy, unspecified: Secondary | ICD-10-CM | POA: Insufficient documentation

## 2016-04-17 DIAGNOSIS — I110 Hypertensive heart disease with heart failure: Secondary | ICD-10-CM | POA: Diagnosis not present

## 2016-04-17 DIAGNOSIS — I10 Essential (primary) hypertension: Secondary | ICD-10-CM | POA: Diagnosis not present

## 2016-04-17 DIAGNOSIS — Z7901 Long term (current) use of anticoagulants: Secondary | ICD-10-CM

## 2016-04-17 DIAGNOSIS — Z7982 Long term (current) use of aspirin: Secondary | ICD-10-CM | POA: Insufficient documentation

## 2016-04-17 LAB — CBC
HEMATOCRIT: 37.9 % — AB (ref 39.0–52.0)
Hemoglobin: 12.4 g/dL — ABNORMAL LOW (ref 13.0–17.0)
MCH: 33.3 pg (ref 26.0–34.0)
MCHC: 32.7 g/dL (ref 30.0–36.0)
MCV: 101.9 fL — ABNORMAL HIGH (ref 78.0–100.0)
PLATELETS: 302 10*3/uL (ref 150–400)
RBC: 3.72 MIL/uL — ABNORMAL LOW (ref 4.22–5.81)
RDW: 13.2 % (ref 11.5–15.5)
WBC: 5.6 10*3/uL (ref 4.0–10.5)

## 2016-04-17 LAB — BASIC METABOLIC PANEL
Anion gap: 8 (ref 5–15)
BUN: 9 mg/dL (ref 6–20)
CALCIUM: 9.3 mg/dL (ref 8.9–10.3)
CO2: 23 mmol/L (ref 22–32)
CREATININE: 1.11 mg/dL (ref 0.61–1.24)
Chloride: 104 mmol/L (ref 101–111)
GFR calc Af Amer: >60 mL/min (ref >60)
GLUCOSE: 107 mg/dL — AB (ref 65–99)
POTASSIUM: 4.1 mmol/L (ref 3.5–5.1)
SODIUM: 135 mmol/L (ref 135–145)

## 2016-04-17 LAB — PROTIME-INR
INR: 2.87
Prothrombin Time: 30.7 seconds — ABNORMAL HIGH (ref 11.4–15.2)

## 2016-04-17 LAB — LACTATE DEHYDROGENASE: LDH: 262 U/L — AB (ref 98–192)

## 2016-04-17 MED ORDER — AMLODIPINE BESYLATE 5 MG PO TABS: 5.0000 mg | ORAL_TABLET | Freq: Every day | ORAL | 3 refills | Status: DC

## 2016-04-17 NOTE — Progress Notes (Signed)
6 Minute Walk Completed  Patient ambulated total of 1580 ft (481.584 meters) without any rest breaks.  Quick and steady gait/pace, no dyspnea noted.  Patient tolerated well and asymptomatic throughout.  Starting vitals: 87 HR, 99%, 122/0 During walk: HR 94-112, O2 on RA 88-100% End: HR 90, 98% on RA, 124/0 doppler  Results documented on 6 Minute Walk flowsheet as well.  VAD Coordinator Carlton Adam, RN made aware.  Ave Filter, RN

## 2016-04-17 NOTE — Progress Notes (Addendum)
Symptom  Yes  No  Details   Angina    x Activity:   Claudication    x How far:   Syncope     x When:   Stroke    x   Orthopnea    x How many pillows:  for comfort  PND    x How often:  CPAP   x How many hrs:   Pedal edema    x   Abd fullness    x   N&V    x   Diaphoresis    x When:  Bleeding   x No BPBPR or in urine  Urine color  x Light yellow  SOB    x Activity:   Palpitations    x When:  ICD shock    x   Hospitalizations     x When/where/why:  ED visit    x When/where/why:  Other MD    x When/who/why:  Activity    Walking 1/2 - 1 mile daily  Fluid    < 2 L daily   Diet    Low sodium.    Vital signs: HR: 87 Doppler MAP:122 Auto cuff:143/97 (121) O2 Sat: 99 % Wt: 184 lbs Last weight: 173.2 lbs Ht: 5'11"  LVAD interrogation reveals (reviewed with Dr Shirlee Latch):  Speed: 9400 Flow: 5.6 Power: 5.9 w PI: 5.9  Alarms: none Events: rare PI  Fixed speed: 9600 Low speed limit: 9000  Primary Controller: Replace back up battery in 24 months. Back up controller: Replace back up battery in 24 months.   I reviewed the LVAD parameters from oday, and compared the results to the patient's prior recorded data. No programming changes were made. The LVAD is functioning within specified parameters. LVAD interrogation was NEGATIVE for any significant power changes or alarms; RARE PI events/speed drops present which is consistent with the patient's history.   LVAD equipment check completed and is in good working order. Back-up equipment present. LVAD education done on emergency procedures and precautions and reviewed exit site care.   LVAD exit site:  VAD dressing dry and intact; attachment device in place and accurately applied. Exit site well healed and incorporated. The velour is fully implanted at  exit site. No redness, tenderness, drainage, or foul odor noted. Driveline dressing is being changed weekly per sterile technique.  Pt states he has adequate dressing supplies at home.  Encounter Details: Pt states that he is doing well and feels great.  Pt denies any issues and is currently residing in [place], as his home in [place] is "under construction."  Pt denies any alarms or VAD equipment issues.    INR 2.87 --see anticoag note. Goal 2 - 2.5. 81 mg ASA.     LDH 262 --Stable within established baseline since implant.   Hgb 12.4 stable since last visit and on current anticoagulation.  MAP 120s with doppler pressure reflecting true MAP today.   Pt instructions:  1. Will call you with labs and Coumadin Dosing. 2. Return to clinic in 2 months.  3. We will fax a transplant referral packet to [facility].   Matthew Adam, RN VAD Coordinator   Office: [phone]    LVAD CLINIC NOTE Cardiology: Dr. Shirlee Latch  Matthew Vaughan is a 60 yo with history of nonischemic cardiomyopathy/chronic systolic CHF (EF 8% by MRI) and LV thrombus now s/p LVAD placement on 11/21/15 presents for VAD clinic followup.      Here for followup. He continues to do very well. Much  more active. Can do almost anything he wants. Denies dyspnea, orthopnea, PND or edema.  Back to driving.  Appetite is great.  No BRBPR or melena. No neuro symptoms. Doing some light weights.  MAP remains high, taking lisinopril.   LVAD interrogation  See nurse's note above.  Labs (5/17): creatinine 1.05 => 0.91, HCT 30, LDH 235 => 294 Labs (6/17): K 3.7, creatinine 1.17, LDL 262 => 281, HCT 36.3, INR 2.6 Labs (8/17): hgb 11.2, LDH 243, creatinine 1.09  PMH: 1. Chronic systolic CHF: Nonischemic cardiomyopathy.  Cause uncertain => not a heavy drinker, had one sister with what sounds like sudden death. Possible viral myocarditis.   - LHC/RHC (4/27): No angiographic CAD.  Mean RA 22, PA 57/34, mean PCWP 35, CI 1.39, PVR 2.6 WU. - Echo (4/17):  Severely dilated LV with EF 10-15%.  - Cardiac MRI (4/17): EF 8%, severely dilated LV, LV thrombus, mildly dilated RV with moderately decreased systolic function, patchy inferior LGE pattern could be consistent with prior myocarditis.  - Heartmate II LVAD placed 11/21/15.  2. LV thrombus.  3. HTN  FH: No history of premature CAD or cardiomyopathy.   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  Nonsmoker.  Rare ETOH.   ROS: All systems reviewed and negative except as per HPI.   Current Outpatient Prescriptions  Medication Sig Dispense Refill  . aspirin 81 MG chewable tablet Chew 1 tablet (81 mg total) by mouth daily. 30 tablet 6  . docusate sodium (COLACE) 100 MG capsule Take 100 mg by mouth daily as needed.     Marland Kitchen. lisinopril (PRINIVIL,ZESTRIL) 20 MG tablet Take 1 tablet (20 mg total) by mouth daily. 30 tablet 3  . warfarin (COUMADIN) 5 MG tablet Take as directed by the LVAD Clinic 60 tablet 6  . amLODipine (NORVASC) 5 MG tablet Take 1 tablet (5 mg total) by mouth daily. 180 tablet 3  . Blood Pressure Monitoring (BLOOD PRESSURE CUFF) MISC 1 Units by Does not apply route daily. (Patient not taking: Reported on 04/17/2016) 1 each 0  . enoxaparin (LOVENOX) 40 MG/0.4ML injection Inject 40 mg into the skin daily.    Marland Kitchen. enoxaparin (LOVENOX) 40 MG/0.4ML injection Inject 0.4 mLs (40 mg total) into the skin every 12 (twelve) hours. (Patient not taking: Reported on 04/17/2016) 10 Syringe 1  . gabapentin (NEURONTIN) 300 MG capsule Take 1 capsule (300 mg total) by mouth 2 (two) times daily. (Patient not taking: Reported on 04/17/2016) 60 capsule 5   No current facility-administered medications for this encounter.    BP (!) 122/0 (BP Location: Right Arm, Patient Position: Sitting, Cuff Size: Normal)   Pulse 87   Resp 18   Wt 184 lb (83.5 kg)   BMI 25.66 kg/m   Vital signs: HR 76 MAP 110  General: NAD. Looks good.  Neck: No JVD, no thyromegaly  or thyroid nodule.  Lungs: Clear to auscultation bilaterally with normal respiratory effort. CV: LVAD hum.  No peripheral edema.  Abdomen: Soft, nontender, no hepatosplenomegaly, no distention.  Skin: Intact without lesions or rashes. Driveline site benign.  Neurologic: Alert and oriented x 3.  Psych: Normal affect. Extremities: No clubbing or cyanosis.  HEENT: Normal.  Assessment/Plan: 1. HeartMate II LVAD: Patient is doing very well.  LVAD parameters are stable. BP high again today.   - Continue lisinopril 20 mg daily.  - Add amlodipine 5 mg daily.     - Volume status looks  good off diuretics. Can use Lasix prn - Continue ASA 81 + warfarin with INR goal 2-2.5.  LDH has been stable.  - Referral to [facility] for transplant evaluation.  2. Chronic systolic CHF: Much improved since LVAD placement, now NYHA class II.  He is exercising regularly.  3. LV thrombus: Noted on pre-op cMRI.  He is on warfarin.   Followup in 2 months.    [name] 04/17/2016

## 2016-04-17 NOTE — Progress Notes (Signed)
Mr.Tarte said he was doing well, indicated he was planning to move and that he no longer resided with his brother do to a "falling out".  Pt indicated he currently has an active application for subsidized housing. He also indicated he is doing his own dressing changes and denied any other concerns at this time. CSW will continue to be available as needed. Lasandra Beech, LCSW [phone]

## 2016-04-25 ENCOUNTER — Ambulatory Visit (HOSPITAL_COMMUNITY): Payer: Self-pay | Admitting: Infectious Diseases

## 2016-04-25 LAB — PROTIME-INR: INR: 1.4 — AB (ref <=1.1)

## 2016-04-27 ENCOUNTER — Ambulatory Visit (HOSPITAL_COMMUNITY): Payer: Self-pay | Admitting: Infectious Diseases

## 2016-04-27 ENCOUNTER — Other Ambulatory Visit (HOSPITAL_COMMUNITY): Payer: Self-pay | Admitting: Pharmacist

## 2016-04-27 DIAGNOSIS — Z7901 Long term (current) use of anticoagulants: Secondary | ICD-10-CM

## 2016-04-27 MED ORDER — LOVENOX 40 MG/0.4ML ~~LOC~~ SOLN: 40.0000 mg | Freq: Two times a day (BID) | SUBCUTANEOUS | 1 refills | Status: DC

## 2016-04-30 ENCOUNTER — Ambulatory Visit (HOSPITAL_COMMUNITY): Payer: Self-pay | Admitting: Pharmacist

## 2016-04-30 DIAGNOSIS — Z95811 Presence of heart assist device: Secondary | ICD-10-CM

## 2016-04-30 LAB — PROTIME-INR: INR: 2.1 — AB (ref <=1.1)

## 2016-05-07 ENCOUNTER — Ambulatory Visit (HOSPITAL_COMMUNITY): Payer: Self-pay | Admitting: Pharmacist

## 2016-05-07 DIAGNOSIS — Z95811 Presence of heart assist device: Secondary | ICD-10-CM

## 2016-05-07 LAB — POCT INR: INR: 1.9

## 2016-05-14 ENCOUNTER — Other Ambulatory Visit: Payer: Self-pay | Admitting: Cardiology

## 2016-05-14 DIAGNOSIS — Z95811 Presence of heart assist device: Secondary | ICD-10-CM

## 2016-05-14 DIAGNOSIS — I5022 Chronic systolic (congestive) heart failure: Secondary | ICD-10-CM

## 2016-05-15 ENCOUNTER — Ambulatory Visit (HOSPITAL_COMMUNITY): Payer: Self-pay | Admitting: Pharmacist

## 2016-05-15 DIAGNOSIS — Z95811 Presence of heart assist device: Secondary | ICD-10-CM

## 2016-05-15 LAB — PROTIME-INR: INR: 2.7 — AB (ref <=1.1)

## 2016-05-22 ENCOUNTER — Ambulatory Visit (HOSPITAL_COMMUNITY): Payer: Self-pay | Admitting: Pharmacist

## 2016-05-22 DIAGNOSIS — Z95811 Presence of heart assist device: Secondary | ICD-10-CM

## 2016-05-22 LAB — POCT INR: INR: 1.7

## 2016-05-28 ENCOUNTER — Ambulatory Visit (HOSPITAL_COMMUNITY): Payer: Self-pay | Admitting: Pharmacist

## 2016-05-28 DIAGNOSIS — Z95811 Presence of heart assist device: Secondary | ICD-10-CM

## 2016-05-28 LAB — PROTIME-INR: INR: 2.4 — AB (ref <=1.1)

## 2016-06-11 ENCOUNTER — Ambulatory Visit (HOSPITAL_COMMUNITY): Payer: Self-pay | Admitting: Pharmacist

## 2016-06-11 DIAGNOSIS — Z95811 Presence of heart assist device: Secondary | ICD-10-CM

## 2016-06-11 LAB — PROTIME-INR: INR: 2.1 — AB (ref <=1.1)

## 2016-06-18 ENCOUNTER — Ambulatory Visit (HOSPITAL_COMMUNITY)
Admission: RE | Admit: 2016-06-18 | Discharge: 2016-06-18 | Disposition: A | Discharge status: Home / Self Care | Payer: Medicaid Other | Source: Ambulatory Visit | Attending: Cardiology | Admitting: Cardiology

## 2016-06-18 ENCOUNTER — Ambulatory Visit (HOSPITAL_COMMUNITY): Payer: Self-pay | Admitting: Pharmacist

## 2016-06-18 DIAGNOSIS — I513 Intracardiac thrombosis, not elsewhere classified: Secondary | ICD-10-CM | POA: Insufficient documentation

## 2016-06-18 DIAGNOSIS — Z7901 Long term (current) use of anticoagulants: Secondary | ICD-10-CM | POA: Diagnosis not present

## 2016-06-18 DIAGNOSIS — N529 Male erectile dysfunction, unspecified: Secondary | ICD-10-CM | POA: Diagnosis not present

## 2016-06-18 DIAGNOSIS — I10 Essential (primary) hypertension: Secondary | ICD-10-CM | POA: Diagnosis not present

## 2016-06-18 DIAGNOSIS — I110 Hypertensive heart disease with heart failure: Secondary | ICD-10-CM | POA: Insufficient documentation

## 2016-06-18 DIAGNOSIS — Z79899 Other long term (current) drug therapy: Secondary | ICD-10-CM | POA: Diagnosis not present

## 2016-06-18 DIAGNOSIS — I5022 Chronic systolic (congestive) heart failure: Secondary | ICD-10-CM | POA: Diagnosis not present

## 2016-06-18 DIAGNOSIS — Z95811 Presence of heart assist device: Secondary | ICD-10-CM | POA: Insufficient documentation

## 2016-06-18 DIAGNOSIS — I428 Other cardiomyopathies: Secondary | ICD-10-CM | POA: Diagnosis not present

## 2016-06-18 LAB — CBC
HCT: 35.6 % — ABNORMAL LOW (ref 39.0–52.0)
Hemoglobin: 11.5 g/dL — ABNORMAL LOW (ref 13.0–17.0)
MCH: 32.9 pg (ref 26.0–34.0)
MCHC: 32.3 g/dL (ref 30.0–36.0)
MCV: 101.7 fL — ABNORMAL HIGH (ref 78.0–100.0)
PLATELETS: 287 10*3/uL (ref 150–400)
RBC: 3.5 MIL/uL — AB (ref 4.22–5.81)
RDW: 13 % (ref 11.5–15.5)
WBC: 5.3 10*3/uL (ref 4.0–10.5)

## 2016-06-18 LAB — LACTATE DEHYDROGENASE: LDH: 284 U/L — AB (ref 98–192)

## 2016-06-18 LAB — BASIC METABOLIC PANEL
Anion gap: 7 (ref 5–15)
BUN: 13 mg/dL (ref 6–20)
CALCIUM: 9.2 mg/dL (ref 8.9–10.3)
CO2: 24 mmol/L (ref 22–32)
CREATININE: 1.14 mg/dL (ref 0.61–1.24)
Chloride: 105 mmol/L (ref 101–111)
Glucose, Bld: 84 mg/dL (ref 65–99)
Potassium: 4.2 mmol/L (ref 3.5–5.1)
SODIUM: 136 mmol/L (ref 135–145)

## 2016-06-18 LAB — PROTIME-INR
INR: 2.18
PROTHROMBIN TIME: 24.6 s — AB (ref 11.4–15.2)

## 2016-06-18 MED ORDER — AMLODIPINE BESYLATE 10 MG PO TABS: 10.0000 mg | ORAL_TABLET | Freq: Every day | ORAL | 2 refills | Status: DC

## 2016-06-18 MED ORDER — LISINOPRIL 10 MG PO TABS: 10.0000 mg | ORAL_TABLET | Freq: Every day | ORAL | 3 refills | Status: DC

## 2016-06-18 MED ORDER — LISINOPRIL 10 MG PO TABS
10.0000 mg | ORAL_TABLET | Freq: Every day | ORAL | 3 refills | Status: DC
Start: 2016-06-18 — End: 2016-07-02

## 2016-06-18 NOTE — Progress Notes (Signed)
Patient presents for 2 month in VAD Clinic today. Reports no problems with VAD equipment or concerns with drive line. Stopped ASA d/t nose bleeds.   Vital Signs:  Doppler Pressure: 100 Automatc BP: 101/67 (89) HR:  57 SPO2: 96 %  Weight: 190.4 lb w/ eqt  VAD Indication: Destination Therapy - Evaluation completed at.   VAD interrogation & Equipment Management: Speed:  Flow:  Power: w    PI:   Alarms: no clinical alarms  Events:  Fixed speed:  Low speed limit:   Primary Controller:  Replace back up battery in  months. Back up controller:   Replace back up battery in  months.  Annual Equipment Maintenance on UBC/PM was performed on .   I reviewed the LVAD parameters from today and compared the results to the patient's prior recorded data. LVAD interrogation was NEGATIVE for significant power changes, NEGATIVE for clinical alarms and STABLE for PI events/speed drops. No programming changes were made and pump is functioning within specified parameters. Pt is performing daily controller and system monitor self tests along with completing weekly and monthly maintenance for LVAD equipment.  LVAD equipment check completed and is in good working order. Back-up equipment present. Charged back up battery and performed self-test on equipment.   Exit Site Care: Drive line is being maintained  by . Drive line exit site well healed and incorporated. The velour is fully implanted at exit site. Dressing dry and intact. No erythema or drainage. Stabilization device present and accurately applied. Pt denies fever or chills. Pt states they have adequate dressing supplies at home.   Significant Events on VAD Support:    Device:  Therapies:  Last check:   BP & Labs:  MAP  - Doppler is reflecting MAP  Hgb - No S/S of bleeding. Specifically denies melena/BRBPR or nosebleeds.  LDH stable at  with established baseline of . Denies tea-colored urine. No power elevations noted on  interrogation.    Rexene Alberts, RN VAD Coordinator   Office: [phone] 24/7 Emergency VAD Pager: [phone]

## 2016-06-18 NOTE — Patient Instructions (Addendum)
VAD Clinic number [phone] (Office 8-5)  24h Emergency VAD Coordinator pager [phone]  **Put in the number you would like a call back to then hit # button  Call to let us know when you are more than 4 hours away so we can give you information regarding the closest VAD Center.   OK to to stop ASA  Lisinopril - pick up new prescription and start taking ONE pill (10 mg) every day Tuesday 06/19/16  Amlodipine - take 2 pills (10 mg) every day from current bottle starting 06/19/16. When you pick up your new prescription take only ONE pill every day.   Back to see [place] in 2 months.

## 2016-06-18 NOTE — Progress Notes (Signed)
LVAD CLINIC NOTE Cardiology: Dr. Shirlee Latch  Mr. Schults is a 60 yo with history of nonischemic cardiomyopathy/chronic systolic CHF (EF 8% by MRI) and LV thrombus now s/p LVAD placement on 11/21/15 presents for VAD clinic followup.      Here for followup. He continues to do very well. Much more active. Can do almost anything he wants. Denies dyspnea, orthopnea, PND or edema.  Living in [place] again.  Appetite is great.  No BRBPR or melena. No neuro symptoms. Doing some light weights. MAP now controlled.  Main complaint is erectile dysfunction that has worsened significantly since lisinopril was increased to 20 mg daily.  He is no longer taking ASA 81.  While on ASA, he was having 2-3 major nosebleeds a week.  None off ASA.  He has tried a couple of times to restart with no success.    LVAD interrogation  See LVAD nurse's note from today.   Labs (5/17): creatinine 1.05 => 0.91, HCT 30, LDH 235 => 294 Labs (6/17): K 3.7, creatinine 1.17, LDL 262 => 281, HCT 36.3, INR 2.6 Labs (8/17): hgb 11.2, LDH 243, creatinine 1.09 Labs (10/17): K 4.1, creatinine 1.11, hgb 12.4, LDH 262  PMH: 1. Chronic systolic CHF: Nonischemic cardiomyopathy.  Cause uncertain => not a heavy drinker, had one sister with what sounds like sudden death. Possible viral myocarditis.   - LHC/RHC (4/27): No angiographic CAD.  Mean RA 22, PA 57/34, mean PCWP 35, CI 1.39, PVR 2.6 WU. - Echo (4/17): Severely dilated LV with EF 10-15%.  - Cardiac MRI (4/17): EF 8%, severely dilated LV, LV thrombus, mildly dilated RV with moderately decreased systolic function, patchy inferior LGE pattern could be consistent with prior myocarditis.  - Heartmate II LVAD placed 11/21/15.  2. LV thrombus.  3. HTN 4. Epistaxis  FH: No history of premature CAD or cardiomyopathy.   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  Nonsmoker.  Rare ETOH.   ROS: All systems reviewed and negative except  as per HPI.   Current Outpatient Prescriptions  Medication Sig Dispense Refill  . amLODipine (NORVASC) 10 MG tablet Take 1 tablet (10 mg total) by mouth daily. 30 tablet 2  . lisinopril (PRINIVIL,ZESTRIL) 10 MG tablet Take 1 tablet (10 mg total) by mouth daily. 30 tablet 3  . warfarin (COUMADIN) 5 MG tablet Take as directed by the LVAD Clinic 60 tablet 6  . Blood Pressure Monitoring (BLOOD PRESSURE CUFF) MISC 1 Units by Does not apply route daily. (Patient not taking: Reported on 04/17/2016) 1 each 0  . docusate sodium (COLACE) 100 MG capsule Take 100 mg by mouth daily as needed.     . gabapentin (NEURONTIN) 300 MG capsule Take 1 capsule (300 mg total) by mouth 2 (two) times daily. (Patient not taking: Reported on 06/18/2016) 60 capsule 5  . LOVENOX 40 MG/0.4ML injection Inject 0.4 mLs (40 mg total) into the skin every 12 (twelve) hours. (Patient not taking: Reported on 06/18/2016) 10 Syringe 1   No current facility-administered medications for this encounter.    There were no vitals taken for this visit.  Vital signs: MAP 89  General: NAD. Looks good.  Neck: No JVD, no thyromegaly or thyroid nodule.  Lungs: Clear to auscultation bilaterally with normal respiratory effort. CV: LVAD hum.  No peripheral edema.  Abdomen: Soft, nontender, no hepatosplenomegaly, no distention.  Skin: Intact without lesions or rashes. Driveline site benign.  Neurologic:  Alert and oriented x 3.  Psych: Normal affect. Extremities: No clubbing or cyanosis.  HEENT: Normal.  Assessment/Plan: 1. HeartMate II LVAD: Patient is doing very well.  LVAD parameters are stable. BP now controlled, but increase in lisinopril seems to have worsened erectile dysfunction. - Decrease lisinopril to 10 mg daily and increase amlodipine to 10 mg daily.      - Volume status looks good off diuretics. Can use Lasix prn - Continue warfarin with INR goal 2-2.5.  LDH has been stable. He is off ASA due to intractable nose bleeds when  taking ASA.  - [facility].  2. Chronic systolic CHF: Much improved since LVAD placement, now NYHA class II.  He is exercising regularly.  3. LV thrombus: Noted on pre-op cMRI.  He is on warfarin.   Followup in 2 months.    Marca AnconaDalton Aniruddh Ciavarella 06/18/2016

## 2016-07-02 ENCOUNTER — Ambulatory Visit (HOSPITAL_COMMUNITY): Payer: Self-pay | Admitting: Pharmacist

## 2016-07-02 ENCOUNTER — Encounter (HOSPITAL_COMMUNITY): Payer: Self-pay | Admitting: Pharmacist

## 2016-07-02 ENCOUNTER — Telehealth (HOSPITAL_COMMUNITY): Payer: Self-pay | Admitting: Pharmacist

## 2016-07-02 DIAGNOSIS — Z95811 Presence of heart assist device: Secondary | ICD-10-CM

## 2016-07-02 LAB — PROTIME-INR: INR: 2.8 — AB (ref <=1.1)

## 2016-07-02 MED ORDER — LISINOPRIL 20 MG PO TABS: 20.0000 mg | ORAL_TABLET | Freq: Every day | ORAL | 3 refills | Status: DC

## 2016-07-02 MED ORDER — AMLODIPINE BESYLATE 5 MG PO TABS: 5.0000 mg | ORAL_TABLET | Freq: Every day | ORAL | 3 refills | Status: DC

## 2016-07-02 NOTE — Telephone Encounter (Signed)
Mr. Baily stated that when his lisinopril was decreased from 20 mg daily to 10 mg daily and amlodipine increased from 5 mg daily to 10 mg daily, he had an "odd" sensation in the back of his head/neck so he went back to taking lisinopril 20 mg daily and amlodipine 5 mg daily. Discussed this with Dr. Shirlee Latch who agreed with change. He will notify us if this happens again.   Tyler Deis. Bonnye Fava, PharmD, BCPS, CPP Clinical Pharmacist Pager: [phone] Phone: [phone] 07/02/2016 3:07 PM

## 2016-07-18 ENCOUNTER — Ambulatory Visit (HOSPITAL_COMMUNITY): Payer: Self-pay | Admitting: Pharmacist

## 2016-07-18 DIAGNOSIS — Z95811 Presence of heart assist device: Secondary | ICD-10-CM

## 2016-07-18 LAB — PROTIME-INR: INR: 2.4 — AB (ref <=1.1)

## 2016-07-31 ENCOUNTER — Ambulatory Visit (HOSPITAL_COMMUNITY): Payer: Self-pay | Admitting: Pharmacist

## 2016-07-31 DIAGNOSIS — Z95811 Presence of heart assist device: Secondary | ICD-10-CM

## 2016-07-31 LAB — PROTIME-INR: INR: 2.6 — AB (ref <=1.1)

## 2016-08-14 ENCOUNTER — Ambulatory Visit (HOSPITAL_COMMUNITY): Payer: Self-pay | Admitting: Pharmacist

## 2016-08-14 DIAGNOSIS — Z95811 Presence of heart assist device: Secondary | ICD-10-CM

## 2016-08-14 LAB — PROTIME-INR: INR: 3.1 — AB (ref <=1.1)

## 2016-08-16 ENCOUNTER — Encounter (HOSPITAL_COMMUNITY): Payer: Self-pay | Admitting: *Deleted

## 2016-08-20 ENCOUNTER — Encounter (HOSPITAL_COMMUNITY): Payer: Medicaid Other

## 2016-08-23 ENCOUNTER — Ambulatory Visit (HOSPITAL_COMMUNITY)
Admission: RE | Admit: 2016-08-23 | Discharge: 2016-08-23 | Disposition: A | Discharge status: Home / Self Care | Payer: Medicaid Other | Source: Ambulatory Visit | Attending: Cardiology | Admitting: Cardiology

## 2016-08-23 ENCOUNTER — Ambulatory Visit (HOSPITAL_COMMUNITY): Payer: Self-pay | Admitting: Pharmacist

## 2016-08-23 DIAGNOSIS — N529 Male erectile dysfunction, unspecified: Secondary | ICD-10-CM | POA: Diagnosis not present

## 2016-08-23 DIAGNOSIS — I110 Hypertensive heart disease with heart failure: Secondary | ICD-10-CM | POA: Insufficient documentation

## 2016-08-23 DIAGNOSIS — Z792 Long term (current) use of antibiotics: Secondary | ICD-10-CM

## 2016-08-23 DIAGNOSIS — R040 Epistaxis: Secondary | ICD-10-CM | POA: Insufficient documentation

## 2016-08-23 DIAGNOSIS — I513 Intracardiac thrombosis, not elsewhere classified: Secondary | ICD-10-CM | POA: Insufficient documentation

## 2016-08-23 DIAGNOSIS — Z95811 Presence of heart assist device: Secondary | ICD-10-CM

## 2016-08-23 DIAGNOSIS — I5022 Chronic systolic (congestive) heart failure: Secondary | ICD-10-CM | POA: Diagnosis present

## 2016-08-23 DIAGNOSIS — I428 Other cardiomyopathies: Secondary | ICD-10-CM

## 2016-08-23 DIAGNOSIS — Z7901 Long term (current) use of anticoagulants: Secondary | ICD-10-CM | POA: Diagnosis not present

## 2016-08-23 DIAGNOSIS — I10 Essential (primary) hypertension: Secondary | ICD-10-CM

## 2016-08-23 DIAGNOSIS — Z5181 Encounter for therapeutic drug level monitoring: Secondary | ICD-10-CM

## 2016-08-23 DIAGNOSIS — I429 Cardiomyopathy, unspecified: Secondary | ICD-10-CM | POA: Diagnosis not present

## 2016-08-23 LAB — CBC
HCT: 33.9 % — ABNORMAL LOW (ref 39.0–52.0)
Hemoglobin: 10.7 g/dL — ABNORMAL LOW (ref 13.0–17.0)
MCH: 31.3 pg (ref 26.0–34.0)
MCHC: 31.6 g/dL (ref 30.0–36.0)
MCV: 99.1 fL (ref 78.0–100.0)
PLATELETS: 286 10*3/uL (ref 150–400)
RBC: 3.42 MIL/uL — AB (ref 4.22–5.81)
RDW: 13.2 % (ref 11.5–15.5)
WBC: 5.8 10*3/uL (ref 4.0–10.5)

## 2016-08-23 LAB — BASIC METABOLIC PANEL
Anion gap: 8 (ref 5–15)
BUN: 12 mg/dL (ref 6–20)
CO2: 25 mmol/L (ref 22–32)
CREATININE: 1.19 mg/dL (ref 0.61–1.24)
Calcium: 9.1 mg/dL (ref 8.9–10.3)
Chloride: 104 mmol/L (ref 101–111)
GFR calc Af Amer: >60 mL/min (ref >60)
GLUCOSE: 87 mg/dL (ref 65–99)
POTASSIUM: 3.9 mmol/L (ref 3.5–5.1)
SODIUM: 137 mmol/L (ref 135–145)

## 2016-08-23 LAB — LACTATE DEHYDROGENASE: LDH: 295 U/L — AB (ref 98–192)

## 2016-08-23 LAB — PROTIME-INR
INR: 2.53
PROTHROMBIN TIME: 27.7 s — AB (ref 11.4–15.2)

## 2016-08-23 MED ORDER — AMOXICILLIN 500 MG PO TABS: 500.0000 mg | ORAL_TABLET | Freq: Two times a day (BID) | ORAL | 0 refills | Status: DC

## 2016-08-23 MED ORDER — SILDENAFIL CITRATE 20 MG PO TABS: 40.0000 mg | ORAL_TABLET | ORAL | 2 refills | Status: DC | PRN

## 2016-08-23 NOTE — Progress Notes (Addendum)
Patient presents for 2 month in VAD Clinic today. Reports no problems with VAD equipment or concerns with drive line.   Vital Signs:  Doppler Pressure:108 Automatc BP: 147/79 (106) HR: 94 SPO2: 100%  Reported home BP's - 100s / 80-85   Weight: 198.4 w/ eqt Last Weight: 190.4 lb w/ eqt  VAD Indication: Destination Therapy- packet sent to [facility]. No call yet. Will call [facility] today.   VAD Interrogation & Equipment Management: Speed: 9400  Flow: 4.4 Power: 5.4 w PI: 5.5  Alarms: no clinical alarms, many low voltage advisories  Events: 5 - 10 every few days  Fixed speed: 9400 Low speed limit: 8800  Primary Controller: Replace back up battery in 23months. Back up controller: Replace back up battery in 60months.  Annual Equipment Maintenance on UBC/PM was performed on 11/2015  I reviewed the LVAD parameters from todayand compared the results to the patient's prior recorded data with Dr. Shirlee Latch.LVAD interrogation was NEGATIVEfor significant power changes, NEGATIVEfor clinicalalarms and STABLEfor PI events/speed drops. No programming changes were madeand pump is functioning within specified parameters. Pt is performing daily controller and system monitor self tests along with completing weekly and monthly maintenance for LVAD equipment.  LVAD equipment check completed and is in good working order. Back-up equipment present.  Exit Site Care: Drive line is being maintained by himself. Drive line exit site well healed and incorporated. The velour is fully implanted at exit site. Dressing dry and intact. No erythema or drainage. Stabilization device present and accurately applied. Pt denies fever or chills. Pt states they have adequate dressing supplies at home. Provided with 8 anchors, 6 weekly and 10 daily kits. Instructed to use these kits twice a week if he is experiencing itching with sweating while working out.   Significant Events on VAD Support:   none  Device:n/a   BP &Labs:  MAP 88 - Doppler is reflecting MAP (was reading 104 earlier but had caffeine and is nervous being at the doctor) Reports BPs at home 100s / 80s.  Hgb 10.7 - No S/S of bleeding. Specifically denies melena/BRBPR or nosebleeds.  LDH 295 stable at with established baseline of 250 - 325. Denies tea-colored urine. No power elevations noted on interrogation.    Matthew Alberts, RN VAD Coordinator   Office: [phone] 24/7 Emergency VAD Pager: [phone]      LVAD CLINIC NOTE Cardiology: Dr. Shirlee Latch  [name] is a 61 yo with history of nonischemic cardiomyopathy/chronic systolic CHF (EF 8% by MRI) and LV thrombus now s/p LVAD placement on 11/21/15 presents for VAD clinic followup.      Here for followup. He continues to do very well. Much more active. Can do almost anything he wants. Denies dyspnea, orthopnea, PND or edema.  Living in [place] again.  Appetite is great.  No BRBPR or melena. No neuro symptoms. Doing some light weights. MAP now controlled in 80s.  Still with erectile dysfunction.  He is no longer taking ASA 81.  While on ASA, he was having 2-3 major nosebleeds a week.  None off ASA.  He has tried a couple of times to restart with no success.    LVAD interrogation  See LVAD nurse's note from today.   Labs (5/17): creatinine 1.05 => 0.91, HCT 30, LDH 235 => 294 Labs (6/17): K 3.7, creatinine 1.17, LDL 262 => 281, HCT 36.3, INR 2.6 Labs (8/17): hgb 11.2, LDH 243, creatinine 1.09 Labs (10/17): K 4.1, creatinine 1.11, hgb 12.4, LDH 262 Labs (12/17): LDH 284,  K 4.2, creatinine 1.14, hgb 11.5  PMH: 1. Chronic systolic CHF: Nonischemic cardiomyopathy.  Cause uncertain => not a heavy drinker, had one sister with what sounds like sudden death. Possible viral myocarditis.   - LHC/RHC (4/27): No angiographic CAD.  Mean RA 22, PA 57/34, mean PCWP 35, CI 1.39, PVR 2.6 WU. - Echo (4/17): Severely dilated LV with EF 10-15%.  - Cardiac MRI (4/17):  EF 8%, severely dilated LV, LV thrombus, mildly dilated RV with moderately decreased systolic function, patchy inferior LGE pattern could be consistent with prior myocarditis.  - Heartmate II LVAD placed 11/21/15.  2. LV thrombus.  3. HTN 4. Epistaxis  FH: No history of premature CAD or cardiomyopathy.   SH: Divorced, had been living with [place] but now in [place] with his brother, worked as Art gallery manager.  Nonsmoker.  Rare ETOH.   ROS: All systems reviewed and negative except as per HPI.   Current Outpatient Prescriptions  Medication Sig Dispense Refill  . amLODipine (NORVASC) 5 MG tablet Take 1 tablet (5 mg total) by mouth daily. (Patient taking differently: Take 10 mg by mouth daily. ) 180 tablet 3  . Blood Pressure Monitoring (BLOOD PRESSURE CUFF) MISC 1 Units by Does not apply route daily. 1 each 0  . lisinopril (PRINIVIL,ZESTRIL) 20 MG tablet Take 1 tablet (20 mg total) by mouth daily. (Patient taking differently: Take 10 mg by mouth daily. ) 90 tablet 3  . warfarin (COUMADIN) 5 MG tablet Take as directed by the LVAD Clinic 60 tablet 6  . amoxicillin (AMOXIL) 500 MG tablet Take 1 tablet (500 mg total) by mouth 2 (two) times daily. Take 4 tabs 30 - 60 minutes before dental cleanings . 8 tablet 0  . docusate sodium (COLACE) 100 MG capsule Take 100 mg by mouth daily as needed.     . gabapentin (NEURONTIN) 300 MG capsule Take 1 capsule (300 mg total) by mouth 2 (two) times daily. (Patient not taking: Reported on 08/23/2016) 60 capsule 5  . LOVENOX 40 MG/0.4ML injection Inject 0.4 mLs (40 mg total) into the skin every 12 (twelve) hours. (Patient not taking: Reported on 06/18/2016) 10 Syringe 1  . sildenafil (REVATIO) 20 MG tablet Take 2 tablets (40 mg total) by mouth as needed (ED). 16 tablet 2   No current facility-administered medications for this encounter.    There were no vitals taken for this visit.  Vital signs: MAP 89  General: NAD. Looks good.  Neck: No JVD, no thyromegaly or  thyroid nodule.  Lungs: Clear to auscultation bilaterally with normal respiratory effort. CV: LVAD hum.  No peripheral edema.  Abdomen: Soft, nontender, no hepatosplenomegaly, no distention.  Skin: Intact without lesions or rashes. Driveline site benign.  Neurologic: Alert and oriented x 3.  Psych: Normal affect. Extremities: No clubbing or cyanosis.  HEENT: Normal.  Assessment/Plan: 1. HeartMate II LVAD: Patient is doing very well.  LVAD parameters are stable. BP now controlled. - Continue lisinopril 10 mg daily and amlodipine 10 mg daily.      - Volume status looks good off diuretics. Can use Lasix prn - Continue warfarin with INR goal 2-2.5.  LDH has been stable. He is off ASA due to intractable nose bleeds when taking ASA.  - Referral to [facility] for transplant evaluation.  2. Chronic systolic CHF: Much improved since LVAD placement, now NYHA class II.  He is exercising regularly.  3. LV thrombus: Noted on pre-op cMRI.  He is on warfarin.  4. Erectile dysfunction: OK  to use sildenafil prn.                                                                                                                                                                                                          Followup in 2 months.    [name] 08/23/2016

## 2016-08-23 NOTE — Patient Instructions (Addendum)
Can try prilosec over the counter for reflux.   Take sildenafil 2 tablets as needed. If you need a higher dose please call.   Back to see [place] in 2 months.   Can do daily (gauze) dressing kits twice a week to help with itching while working out / sweating.   Will call you with labs.

## 2016-09-05 ENCOUNTER — Other Ambulatory Visit: Payer: Self-pay | Admitting: Internal Medicine

## 2016-09-05 DIAGNOSIS — Z95811 Presence of heart assist device: Secondary | ICD-10-CM

## 2016-09-05 DIAGNOSIS — Z7901 Long term (current) use of anticoagulants: Secondary | ICD-10-CM

## 2016-09-06 ENCOUNTER — Other Ambulatory Visit (HOSPITAL_COMMUNITY): Payer: Medicaid Other

## 2016-09-14 LAB — PROTIME-INR: INR: 2.6 — AB (ref <=1.1)

## 2016-09-19 ENCOUNTER — Telehealth (HOSPITAL_COMMUNITY): Payer: Self-pay | Admitting: Infectious Diseases

## 2016-09-19 ENCOUNTER — Ambulatory Visit (HOSPITAL_COMMUNITY): Payer: Self-pay | Admitting: Pharmacist

## 2016-09-19 DIAGNOSIS — Z95811 Presence of heart assist device: Secondary | ICD-10-CM

## 2016-09-21 ENCOUNTER — Other Ambulatory Visit (HOSPITAL_COMMUNITY): Payer: Self-pay | Admitting: Infectious Diseases

## 2016-09-27 ENCOUNTER — Encounter: Payer: Self-pay | Admitting: *Deleted

## 2016-09-27 ENCOUNTER — Other Ambulatory Visit (HOSPITAL_COMMUNITY): Payer: Self-pay | Admitting: *Deleted

## 2016-09-27 ENCOUNTER — Encounter (HOSPITAL_COMMUNITY): Payer: Self-pay | Admitting: *Deleted

## 2016-09-27 DIAGNOSIS — Z95811 Presence of heart assist device: Secondary | ICD-10-CM

## 2016-09-27 DIAGNOSIS — Z01818 Encounter for other preprocedural examination: Secondary | ICD-10-CM

## 2016-09-27 LAB — PROTIME-INR: INR: 2.7 — AB (ref <=1.1)

## 2016-09-27 NOTE — Telephone Encounter (Signed)
Remaining testing to be done for OHT work up with [facility]:   Abdominal US Ankle/Arm Indices Carotid US Peripheral Venous Arms, bilateral Peripheral Venous Legs, bilateral   Rexene Alberts, RN VAD Coordinator   Office: [phone] 24/7 Emergency VAD Pager: [phone]

## 2016-10-02 ENCOUNTER — Ambulatory Visit (HOSPITAL_COMMUNITY): Payer: Self-pay | Admitting: Pharmacist

## 2016-10-02 DIAGNOSIS — Z95811 Presence of heart assist device: Secondary | ICD-10-CM

## 2016-10-04 LAB — PROTIME-INR: INR: 2.3 — AB (ref <=1.1)

## 2016-10-05 ENCOUNTER — Ambulatory Visit (HOSPITAL_COMMUNITY): Payer: Self-pay | Admitting: Pharmacist

## 2016-10-05 DIAGNOSIS — Z95811 Presence of heart assist device: Secondary | ICD-10-CM

## 2016-10-08 ENCOUNTER — Observation Stay (HOSPITAL_COMMUNITY): Payer: Medicaid Other

## 2016-10-08 ENCOUNTER — Other Ambulatory Visit (HOSPITAL_COMMUNITY): Payer: Self-pay | Admitting: Cardiology

## 2016-10-08 ENCOUNTER — Encounter (HOSPITAL_COMMUNITY)
Admission: RE | Disposition: A | Discharge status: Home / Self Care | Payer: Self-pay | Source: Ambulatory Visit | Attending: Cardiology

## 2016-10-08 ENCOUNTER — Observation Stay (HOSPITAL_COMMUNITY)
Admission: RE | Admit: 2016-10-08 | Discharge: 2016-10-09 | Disposition: A | Discharge status: Home / Self Care | Payer: Medicaid Other | Source: Ambulatory Visit | Attending: Cardiology | Admitting: Cardiology

## 2016-10-08 ENCOUNTER — Encounter (HOSPITAL_COMMUNITY): Payer: Self-pay | Admitting: Cardiology

## 2016-10-08 ENCOUNTER — Observation Stay (HOSPITAL_BASED_OUTPATIENT_CLINIC_OR_DEPARTMENT_OTHER): Payer: Medicaid Other

## 2016-10-08 DIAGNOSIS — I428 Other cardiomyopathies: Secondary | ICD-10-CM | POA: Insufficient documentation

## 2016-10-08 DIAGNOSIS — I5022 Chronic systolic (congestive) heart failure: Secondary | ICD-10-CM | POA: Insufficient documentation

## 2016-10-08 DIAGNOSIS — I110 Hypertensive heart disease with heart failure: Secondary | ICD-10-CM | POA: Diagnosis not present

## 2016-10-08 DIAGNOSIS — Z01818 Encounter for other preprocedural examination: Secondary | ICD-10-CM

## 2016-10-08 DIAGNOSIS — Z87891 Personal history of nicotine dependence: Secondary | ICD-10-CM | POA: Insufficient documentation

## 2016-10-08 DIAGNOSIS — Z01810 Encounter for preprocedural cardiovascular examination: Secondary | ICD-10-CM | POA: Diagnosis not present

## 2016-10-08 DIAGNOSIS — Z7901 Long term (current) use of anticoagulants: Secondary | ICD-10-CM | POA: Diagnosis not present

## 2016-10-08 DIAGNOSIS — I6523 Occlusion and stenosis of bilateral carotid arteries: Secondary | ICD-10-CM | POA: Diagnosis not present

## 2016-10-08 DIAGNOSIS — Z79899 Other long term (current) drug therapy: Secondary | ICD-10-CM | POA: Diagnosis not present

## 2016-10-08 DIAGNOSIS — Z86718 Personal history of other venous thrombosis and embolism: Secondary | ICD-10-CM | POA: Diagnosis not present

## 2016-10-08 DIAGNOSIS — I429 Cardiomyopathy, unspecified: Secondary | ICD-10-CM | POA: Diagnosis not present

## 2016-10-08 DIAGNOSIS — Z95811 Presence of heart assist device: Secondary | ICD-10-CM | POA: Diagnosis not present

## 2016-10-08 HISTORY — PX: RIGHT HEART CATH: CATH118263

## 2016-10-08 LAB — BASIC METABOLIC PANEL
ANION GAP: 7 (ref 5–15)
BUN: 11 mg/dL (ref 6–20)
CHLORIDE: 104 mmol/L (ref 101–111)
CO2: 24 mmol/L (ref 22–32)
Calcium: 8.8 mg/dL — ABNORMAL LOW (ref 8.9–10.3)
Creatinine, Ser: 1.18 mg/dL (ref 0.61–1.24)
GFR calc Af Amer: >60 mL/min (ref >60)
GFR calc non Af Amer: >60 mL/min (ref >60)
GLUCOSE: 111 mg/dL — AB (ref 65–99)
POTASSIUM: 3.9 mmol/L (ref 3.5–5.1)
Sodium: 135 mmol/L (ref 135–145)

## 2016-10-08 LAB — POCT I-STAT 3, VENOUS BLOOD GAS (G3P V)
Acid-base deficit: 2 mmol/L (ref 0.0–2.0)
Acid-base deficit: 2 mmol/L (ref 0.0–2.0)
BICARBONATE: 23.1 mmol/L (ref 20.0–28.0)
BICARBONATE: 23.5 mmol/L (ref 20.0–28.0)
O2 Saturation: 67 %
O2 Saturation: 67 %
PCO2 VEN: 41 mmHg — AB (ref 44.0–60.0)
PCO2 VEN: 42.1 mmHg — AB (ref 44.0–60.0)
PH VEN: 7.355 (ref 7.250–7.430)
PO2 VEN: 36 mmHg (ref 32.0–45.0)
TCO2: 25 mmol/L (ref 0–100)
TCO2: 24 mmol/L (ref 0–100)
pH, Ven: 7.358 (ref 7.250–7.430)
pO2, Ven: 37 mmHg (ref 32.0–45.0)

## 2016-10-08 LAB — CBC
HEMATOCRIT: 34.7 % — AB (ref 39.0–52.0)
HEMOGLOBIN: 11.1 g/dL — AB (ref 13.0–17.0)
MCH: 31.4 pg (ref 26.0–34.0)
MCHC: 32 g/dL (ref 30.0–36.0)
MCV: 98 fL (ref 78.0–100.0)
Platelets: 246 10*3/uL (ref 150–400)
RBC: 3.54 MIL/uL — ABNORMAL LOW (ref 4.22–5.81)
RDW: 13.6 % (ref 11.5–15.5)
WBC: 4.9 10*3/uL (ref 4.0–10.5)

## 2016-10-08 LAB — LACTATE DEHYDROGENASE: LDH: 301 U/L — AB (ref 98–192)

## 2016-10-08 LAB — PROTIME-INR
INR: 2.33
Prothrombin Time: 26 seconds — ABNORMAL HIGH (ref 11.4–15.2)

## 2016-10-08 SURGERY — RIGHT HEART CATH

## 2016-10-08 MED ORDER — LIDOCAINE HCL (PF) 1 % IJ SOLN
INTRAMUSCULAR | Status: DC | PRN
  Administered 2016-10-08: 2 mL

## 2016-10-08 MED ORDER — SODIUM CHLORIDE 0.9% FLUSH: 3.0000 mL | INTRAVENOUS | Status: DC | PRN

## 2016-10-08 MED ORDER — SODIUM CHLORIDE 0.9% FLUSH: 3.0000 mL | Freq: Two times a day (BID) | INTRAVENOUS | Status: DC

## 2016-10-08 MED ORDER — ONDANSETRON HCL 4 MG/2ML IJ SOLN: 4.0000 mg | Freq: Four times a day (QID) | INTRAMUSCULAR | Status: DC | PRN

## 2016-10-08 MED ORDER — LISINOPRIL 10 MG PO TABS
10.0000 mg | ORAL_TABLET | Freq: Every day | ORAL | Status: DC
Start: 2016-10-08 — End: 2016-10-09
  Administered 2016-10-08: 10 mg via ORAL
  Filled 2016-10-08: qty 1

## 2016-10-08 MED ORDER — SODIUM CHLORIDE 0.9 % IV SOLN
INTRAVENOUS | Status: DC
  Administered 2016-10-08: 08:00:00 via INTRAVENOUS

## 2016-10-08 MED ORDER — SODIUM CHLORIDE 0.9 % IV SOLN: 250.0000 mL | INTRAVENOUS | Status: DC | PRN

## 2016-10-08 MED ORDER — FENTANYL CITRATE (PF) 100 MCG/2ML IJ SOLN
INTRAMUSCULAR | Status: DC | PRN
  Administered 2016-10-08: 25 ug via INTRAVENOUS

## 2016-10-08 MED ORDER — LIDOCAINE HCL (PF) 1 % IJ SOLN
INTRAMUSCULAR | Status: AC
  Filled 2016-10-08: qty 30

## 2016-10-08 MED ORDER — SODIUM CHLORIDE 0.9% FLUSH
3.0000 mL | Freq: Two times a day (BID) | INTRAVENOUS | Status: DC
  Administered 2016-10-09: 3 mL via INTRAVENOUS

## 2016-10-08 MED ORDER — MAGNESIUM HYDROXIDE 400 MG/5ML PO SUSP: 15.0000 mL | Freq: Every day | ORAL | Status: DC | PRN

## 2016-10-08 MED ORDER — AMLODIPINE BESYLATE 10 MG PO TABS
10.0000 mg | ORAL_TABLET | Freq: Every day | ORAL | Status: DC
  Administered 2016-10-08: 10 mg via ORAL
  Filled 2016-10-08: qty 1

## 2016-10-08 MED ORDER — ASPIRIN 81 MG PO CHEW
81.0000 mg | CHEWABLE_TABLET | ORAL | Status: AC
  Administered 2016-10-08: 81 mg via ORAL

## 2016-10-08 MED ORDER — ACETAMINOPHEN 325 MG PO TABS: 650.0000 mg | ORAL_TABLET | ORAL | Status: DC | PRN

## 2016-10-08 MED ORDER — GABAPENTIN 300 MG PO CAPS: 300.0000 mg | ORAL_CAPSULE | Freq: Every day | ORAL | Status: DC | PRN

## 2016-10-08 MED ORDER — MIDAZOLAM HCL 2 MG/2ML IJ SOLN
INTRAMUSCULAR | Status: AC
  Filled 2016-10-08: qty 2

## 2016-10-08 MED ORDER — ASPIRIN 81 MG PO CHEW
CHEWABLE_TABLET | ORAL | Status: AC
  Administered 2016-10-08: 81 mg via ORAL
  Filled 2016-10-08: qty 1

## 2016-10-08 MED ORDER — MIDAZOLAM HCL 2 MG/2ML IJ SOLN
INTRAMUSCULAR | Status: DC | PRN
  Administered 2016-10-08: 1 mg via INTRAVENOUS

## 2016-10-08 MED ORDER — FENTANYL CITRATE (PF) 100 MCG/2ML IJ SOLN
INTRAMUSCULAR | Status: AC
  Filled 2016-10-08: qty 2

## 2016-10-08 MED ORDER — ACETAMINOPHEN 500 MG PO TABS: 500.0000 mg | ORAL_TABLET | Freq: Every day | ORAL | Status: DC | PRN

## 2016-10-08 SURGICAL SUPPLY — 7 items
CATH BALLN WEDGE 5F 110CM (CATHETERS) ×3 IMPLANT
PACK CARDIAC CATHETERIZATION (CUSTOM PROCEDURE TRAY) ×3 IMPLANT
SHEATH FAST CATH BRACH 5F 5CM (SHEATH) ×3 IMPLANT
SHEATH GLIDE SLENDER 4/5FR (SHEATH) ×3 IMPLANT
TRANSDUCER W/STOPCOCK (MISCELLANEOUS) ×3 IMPLANT
TUBING ART PRESS 72  MALE/FEM (TUBING) ×2
TUBING ART PRESS 72 MALE/FEM (TUBING) ×1 IMPLANT

## 2016-10-08 NOTE — H&P (Signed)
Advanced Heart Failure VAD History and Physical Note   Reason for Admission: Observation/Transplant Work up  HPI:    Matthew Vaughan is a 61 y.o. male with history of nonischemic cardiomyopathy/chronic systolic CHF (EF 8% by MRI) and LV thrombus now s/p LVAD placement on 11/21/15.      Last seen in HF clinic 08/23/16. Was doing well overall. Had stopped ASA with 2-3 nosebleeds weekly. Referred to [facility] for transplant evaluation.   Pt presented today for RHC as part of transplant work-up. Pt has no complaints.  Has been stable with NYHA II symptoms.  Denies CP, lightheadedness, dizziness. Plan to admit patient to complete his transplant work up.     RHC 10/08/16 RA mean 3 RV 24/5 PA 26/6, mean 16 PCWP mean 6 Oxygen saturations: PA 67% AO 100% Cardiac Output (Fick) 5.58  Cardiac Index (Fick) 2.67  LVAD INTERROGATION:  HeartMate II LVAD:  Flow 4.3 liters/min, speed 9400, power 5.2, PI 5.4.  Seldom PI events  Review of systems complete and found to be negative unless listed in HPI.     Home Medications Prior to Admission medications   Medication Sig Start Date End Date Taking? Authorizing Provider  amLODipine (NORVASC) 5 MG tablet Take 1 tablet (5 mg total) by mouth daily. Patient taking differently: Take 10 mg by mouth daily.  07/02/16 10/03/16 Yes Laurey Morale, MD  amoxicillin (AMOXIL) 500 MG tablet Take 1 tablet (500 mg total) by mouth 2 (two) times daily. Take 4 tabs 30 - 60 minutes before dental cleanings . Patient taking differently: Take 2,000 mg by mouth as directed. 30 - 60 minutes before dental cleanings . 08/23/16  Yes Laurey Morale, MD  gabapentin (NEURONTIN) 300 MG capsule Take 1 capsule (300 mg total) by mouth 2 (two) times daily. Patient taking differently: Take 300 mg by mouth daily as needed (pain).  12/13/15  Yes Laurey Morale, MD  lisinopril (PRINIVIL,ZESTRIL) 10 MG tablet Take 10 mg by mouth daily.   Yes Historical Provider, MD  magnesium hydroxide (MILK OF  MAGNESIA) 400 MG/5ML suspension Take 15 mLs by mouth daily as needed for mild constipation.   Yes Historical Provider, MD  sildenafil (REVATIO) 20 MG tablet Take 2 tablets (40 mg total) by mouth as needed (ED). 08/23/16  Yes Laurey Morale, MD  warfarin (COUMADIN) 5 MG tablet TAKE 2 TABLETS BY MOUTH EVERY DAY OR AS DIRECTED Patient taking differently: Take 7.5mg s daily on Mon,Tues,Wed,Thur,Fri and take 10mg s on Sat and Sun 09/07/16  Yes Dolores Patty, MD  acetaminophen (TYLENOL) 500 MG tablet Take 500 mg by mouth daily as needed for moderate pain or headache.    Historical Provider, MD  Blood Pressure Monitoring (BLOOD PRESSURE CUFF) MISC 1 Units by Does not apply route daily. 02/14/16   Laurey Morale, MD  lisinopril (PRINIVIL,ZESTRIL) 20 MG tablet Take 1 tablet (20 mg total) by mouth daily. Patient not taking: Reported on 10/03/2016 07/02/16 10/03/16  Laurey Morale, MD    Past Medical History: Past Medical History:  Diagnosis Date  . CHF (congestive heart failure) (HCC)   . Hypertension   . Left ventricular thrombus [facility])     Past Surgical History: Past Surgical History:  Procedure Laterality Date  . CARDIAC CATHETERIZATION N/A 11/07/2015   Procedure: Left Heart Cath and Coronary Angiography;  Surgeon: Laurey Morale, MD;  Location: [facility] INVASIVE CV LAB;  Service: Cardiovascular;  Laterality: N/A;  . CARDIAC CATHETERIZATION N/A 11/18/2015   Procedure: Right Heart Cath;  Surgeon: Laurey Morale, MD;  Location: [facility] INVASIVE CV LAB;  Service: Cardiovascular;  Laterality: N/A;  . COLONOSCOPY N/A 11/17/2015   Procedure: COLONOSCOPY;  Surgeon: Napoleon Form, MD;  Location: [facility] ENDOSCOPY;  Service: Endoscopy;  Laterality: N/A;  . ESOPHAGOGASTRODUODENOSCOPY N/A 11/17/2015   Procedure: ESOPHAGOGASTRODUODENOSCOPY (EGD);  Surgeon: Napoleon Form, MD;  Location: [facility] ENDOSCOPY;  Service: Endoscopy;  Laterality: N/A;  . INSERTION OF IMPLANTABLE LEFT VENTRICULAR ASSIST DEVICE N/A 11/21/2015    Procedure: INSERTION OF IMPLANTABLE LEFT VENTRICULAR ASSIST DEVICE;  Surgeon: Alleen Borne, MD;  Location: [facility] OR;  Service: Open Heart Surgery;  Laterality: N/A;  CIRC ARREST  NITRIC OXIDE  . TEE WITHOUT CARDIOVERSION N/A 11/21/2015   Procedure: TRANSESOPHAGEAL ECHOCARDIOGRAM (TEE);  Surgeon: Alleen Borne, MD;  Location: [facility] OR;  Service: Open Heart Surgery;  Laterality: N/A;   Family History: No family history on file.  Social History: Social History   Social History  . Marital status: Divorced    Spouse name: N/A  . Number of children: N/A  . Years of education: N/A   Social History Main Topics  . Smoking status: Former Games developer  . Smokeless tobacco: Not on file  . Alcohol use No  . Drug use: No  . Sexual activity: Not on file   Other Topics Concern  . Not on file   Social History Narrative  . No narrative on file    Allergies:  No Known Allergies  Objective:    Vital Signs:   Temp:  [98.2 F (36.8 C)] 98.2 F (36.8 C) (03/26 0658) Pulse Rate:  [85] 85 (03/26 0658) Resp:  [18] 18 (03/26 0658) BP: (112)/(81) 112/81 (03/26 0658) SpO2:  [100 %] 100 % (03/26 0905) Weight:  [200 lb (90.7 kg)] 200 lb (90.7 kg) (03/26 0658)   Filed Weights   10/08/16 0658  Weight: 200 lb (90.7 kg)    Mean arterial Pressure 90  Physical Exam: General:  Well appearing. No resp difficulty HEENT: Normal Neck: supple. JVP 6-7 cm. Carotids 2+ bilat; no bruits. No lymphadenopathy or thyromegaly appreciated. Cor: Mechanical heart sounds with LVAD hum present. Lungs: CTAB, normal effort Abdomen: soft, NT, ND, no HSM. No bruits or masses. +BS  Driveline: C/D/I; securement device intact and driveline incorporated Extremities: no cyanosis, clubbing, rash, edema Neuro: Alert & oriented x 3. Cranial nerves grossly intact. Moves all 4 extremities w/o difficulty. Affect pleasant    Telemetry: Not yet connected.  Labs: Basic Metabolic Panel:  Recent Labs Lab 10/08/16 0736  NA 135  K  3.9  CL 104  CO2 24  GLUCOSE 111*  BUN 11  CREATININE 1.18  CALCIUM 8.8*    Liver Function Tests: No results for input(s): AST, ALT, ALKPHOS, BILITOT, PROT, ALBUMIN in the last 168 hours. No results for input(s): LIPASE, AMYLASE in the last 168 hours. No results for input(s): AMMONIA in the last 168 hours.  CBC:  Recent Labs Lab 10/08/16 0736  WBC 4.9  HGB 11.1*  HCT 34.7*  MCV 98.0  PLT 246    Cardiac Enzymes: No results for input(s): CKTOTAL, CKMB, CKMBINDEX, TROPONINI in the last 168 hours.  BNP: BNP (last 3 results)  Recent Labs  11/22/15 0416  BNP 585.9*    ProBNP (last 3 results) No results for input(s): PROBNP in the last 8760 hours.   CBG: No results for input(s): GLUCAP in the last 168 hours.  Coagulation Studies:  Recent Labs  10/08/16 0736  LABPROT 26.0*  INR 2.33  Other results:  Imaging:  No results found.    Assessment/Plan   1. HeartMate II LVAD:  - VAD parameters and vitals have been stable.  - Volume status stable by exam and cath - Continue lisinopril 10 mg daily and amlodipine 10 mg daily.  - Continue current coumadin dosing with INR goal 2.0 - 2.5 - Off ASA with intractable nosebleeds.    - Has been referred to [facility] for transplant eval.  - Remaining testing to be done includes: Abdominal US; Ankle/Arm Indices; Carotid US; Peripheral Venous Arms, bilateral; Peripheral Venous Legs; bilateral 2. Chronic systolic CHF: - Stable s/p LVAD placement. Now with NYHA II symptoms - Encouraged to continue regular exercise.   3. LV thrombus:  - Noted on pre-op cMRI.   - Continue warfarin.    Admitting patient for transplant work up. Pt has a large burden of imaging that needs to be performed as above.   I reviewed the LVAD parameters from today, and compared the results to the patient's prior recorded data.  No programming changes were made.  The LVAD is functioning within specified parameters.  The patient performs LVAD  self-test daily.  LVAD interrogation was negative for any significant power changes, alarms or PI events/speed drops.  LVAD equipment check completed and is in good working order.  Back-up equipment present.   LVAD education done on emergency procedures and precautions and reviewed exit site care.  Length of Stay: 0  Luane School 10/08/2016, 9:39 AM  VAD Team Pager [phone] (7am - 7am) +++VAD ISSUES ONLY+++   Advanced Heart Failure Team Pager [phone] (M-F; 7a - 4p)  Please contact [facility] for night-coverage after hours (4p -7a ) and weekends on [email or web address] for all non- LVAD Issues

## 2016-10-08 NOTE — Progress Notes (Signed)
VAD Coordinator Procedure Note:   Patient underwent right heart cath. Hemodynamics and VAD parameters monitored by me throughout the procedure. Blood pressure was monitored with automatic cuff as the doppler was reflecting the  MAP. All of his primary and back up equipment was present during the procedure as well as myself to assist with management.   Pre-procedure:   0910 flow:4.8/speed:9400/PI:5.8/power:5.6/MAP 102  0915 4.12/9398/5.8/5.5/103  0920 4.11/9398/5.6/5.5/95  0925 4.09/9398/5.9/5.4/105  Sedation Induction:Versed / Fentanyl per cath team    Interventions: none   Patient tolerated the procedure well. VAD parameters and hemodynamics were stable throughout the case.   Patient Disposition: 2w20  Marcellus Scott RN VAD Coordinator   Office: [phone] 24/7 VAD Pager: [phone]

## 2016-10-08 NOTE — Progress Notes (Signed)
Admitted 10/08/16 for further transplant evaluation/obs status.   HeartMate II LVAD implated on 11/2015 by Dr Laneta Simmers.   Vital signs: HR: 85 Doppler Pressure:88 Automatic BP: 99/81 O2 Sat: 99/RA Wt:  90.7 kg   LVAD interrogation reveals:  Speed: 9400 Flow: 5.6 Power:  6 PI: 5.7 Alarms: none Events:  0-3 daily Fixed speed: 9400 Low speed limit: 8800  Drive Line: unremarkable  Labs:   LDH trend: 08/23/16- LDH 295  INR trend: 2.33  Anticoagulation Plan: -INR Goal: 2-2.5 -ASA Dose: none due to recurrent nosebleeds  Adverse Events on VAD: -none  Plan/Recommendations:   1. Plan for further transplant eval workup as obs status. Patient to be seen at [facility].

## 2016-10-08 NOTE — H&P (Signed)
Physician History and Physical    Matthew Vaughan MRN: [id number] DOB/AGE: [birth year] 61 y.o. Admit date: 10/08/2016  Primary Cardiologist: Shirlee Latch  HPI: 61 yo with nonischemic cardiomyopathy and HeartMate 2 LVAD.  He is being worked up for transplant at [facility] and needs RHC.  Has been stable, NYHA class II.   Review of systems complete and found to be negative unless listed above   Past Medical History:  Diagnosis Date  . CHF (congestive heart failure) (HCC)   . Hypertension   . Left ventricular thrombus (HCC)      No family history on file.  Social History   Social History  . Marital status: Divorced    Spouse name: N/A  . Number of children: N/A  . Years of education: N/A   Occupational History  . Not on file.   Social History Main Topics  . Smoking status: Former Games developer  . Smokeless tobacco: Not on file  . Alcohol use No  . Drug use: No  . Sexual activity: Not on file   Other Topics Concern  . Not on file   Social History Narrative  . No narrative on file     Prescriptions Prior to Admission  Medication Sig Dispense Refill Last Dose  . acetaminophen (TYLENOL) 500 MG tablet Take 500 mg by mouth daily as needed for moderate pain or headache.     Marland Kitchen amLODipine (NORVASC) 5 MG tablet Take 1 tablet (5 mg total) by mouth daily. (Patient taking differently: Take 10 mg by mouth daily. ) 180 tablet 3 Taking  . amoxicillin (AMOXIL) 500 MG tablet Take 1 tablet (500 mg total) by mouth 2 (two) times daily. Take 4 tabs 30 - 60 minutes before dental cleanings . (Patient taking differently: Take 2,000 mg by mouth as directed. 30 - 60 minutes before dental cleanings .) 8 tablet 0   . gabapentin (NEURONTIN) 300 MG capsule Take 1 capsule (300 mg total) by mouth 2 (two) times daily. (Patient taking differently: Take 300 mg by mouth daily as needed (pain). ) 60 capsule 5 Not Taking  . lisinopril (PRINIVIL,ZESTRIL) 10 MG tablet Take 10 mg by mouth daily.     . magnesium hydroxide  (MILK OF MAGNESIA) 400 MG/5ML suspension Take 15 mLs by mouth daily as needed for mild constipation.     . sildenafil (REVATIO) 20 MG tablet Take 2 tablets (40 mg total) by mouth as needed (ED). 16 tablet 2   . warfarin (COUMADIN) 5 MG tablet TAKE 2 TABLETS BY MOUTH EVERY DAY OR AS DIRECTED (Patient taking differently: Take 7.5mg s daily on Mon,Tues,Wed,Thur,Fri and take 10mg s on Sat and Sun) 60 tablet 2   . Blood Pressure Monitoring (BLOOD PRESSURE CUFF) MISC 1 Units by Does not apply route daily. 1 each 0 Taking  . lisinopril (PRINIVIL,ZESTRIL) 20 MG tablet Take 1 tablet (20 mg total) by mouth daily. (Patient not taking: Reported on 10/03/2016) 90 tablet 3 Not Taking at Unknown time    Physical Exam: Blood pressure 112/81, pulse 85, temperature 98.2 F (36.8 C), temperature source Oral, resp. rate 18, height 5\' 10"  (1.778 m), weight 200 lb (90.7 kg), SpO2 100 %.  General: NAD Neck: No JVD, no thyromegaly or thyroid nodule.  Lungs: Clear to auscultation bilaterally with normal respiratory effort. CV: LVAD sounds Abdomen: Soft, nontender, no hepatosplenomegaly, no distention.  Skin: Intact without lesions or rashes.  Neurologic: Alert and oriented x 3.  Psych: Normal affect. Extremities: No clubbing or cyanosis.  HEENT: Normal.  Labs:   Lab Results  Component Value Date   WBC 4.9 10/08/2016   HGB 11.1 (L) 10/08/2016   HCT 34.7 (L) 10/08/2016   MCV 98.0 10/08/2016   PLT 246 10/08/2016    Recent Labs Lab 10/08/16 0736  NA 135  K 3.9  CL 104  CO2 24  BUN 11  CREATININE 1.18  CALCIUM 8.8*  GLUCOSE 111*    ASSESSMENT AND PLAN: 61 yo with nonischemic with nonischemic cardiomyopathy and HeartMate 2 LVAD. He needs RHC today, working for transplant.  INR 2.33, will plan brachial RHC.   Signed: [name] 10/08/2016, 8:39 AM

## 2016-10-08 NOTE — Progress Notes (Signed)
*  PRELIMINARY RESULTS* Vascular Ultrasound Carotid Duplex (Doppler) has been completed.   There is no obvious evidence of hemodynamically significant internal carotid artery stenosis bilaterally.  Bilateral lower extremity venous duplex completed. Bilateral lower extremities are negative for deep vein thrombosis. There is no evidence of Baker's cyst bilaterally.  Bilateral upper extremity venous duplex completed. Bilateral upper extremities are negative for deep and superficial vein thrombosis.  ABI has been completed. Bilateral brachial, dorsalis pedis, and posterior tibial arteries were patent.  Unable to determine accurate waveforms or ankle brachial indices due to LVAD.   10/08/2016 12:30 PM Gertie Fey, BS, RVT, RDCS, RDMS

## 2016-10-09 DIAGNOSIS — Z95811 Presence of heart assist device: Secondary | ICD-10-CM

## 2016-10-09 DIAGNOSIS — I110 Hypertensive heart disease with heart failure: Secondary | ICD-10-CM | POA: Diagnosis not present

## 2016-10-09 LAB — CBC
HEMATOCRIT: 31.3 % — AB (ref 39.0–52.0)
Hemoglobin: 10.1 g/dL — ABNORMAL LOW (ref 13.0–17.0)
MCH: 32 pg (ref 26.0–34.0)
MCHC: 32.3 g/dL (ref 30.0–36.0)
MCV: 99.1 fL (ref 78.0–100.0)
Platelets: 233 10*3/uL (ref 150–400)
RBC: 3.16 MIL/uL — ABNORMAL LOW (ref 4.22–5.81)
RDW: 13.7 % (ref 11.5–15.5)
WBC: 4.3 10*3/uL (ref 4.0–10.5)

## 2016-10-09 LAB — BASIC METABOLIC PANEL
Anion gap: 8 (ref 5–15)
BUN: 14 mg/dL (ref 6–20)
CALCIUM: 8.7 mg/dL — AB (ref 8.9–10.3)
CO2: 26 mmol/L (ref 22–32)
Chloride: 102 mmol/L (ref 101–111)
Creatinine, Ser: 1.15 mg/dL (ref 0.61–1.24)
GFR calc Af Amer: >60 mL/min (ref >60)
Glucose, Bld: 118 mg/dL — ABNORMAL HIGH (ref 65–99)
POTASSIUM: 4.2 mmol/L (ref 3.5–5.1)
Sodium: 136 mmol/L (ref 135–145)

## 2016-10-09 LAB — PROTIME-INR
INR: 2.08
Prothrombin Time: 23.7 seconds — ABNORMAL HIGH (ref 11.4–15.2)

## 2016-10-09 LAB — LACTATE DEHYDROGENASE: LDH: 234 U/L — AB (ref 98–192)

## 2016-10-09 NOTE — Progress Notes (Signed)
Discharged per MD order. Patient placed self on batteries. Iv removed, tele d/c. No questions at this time. Patient walked with NT to entrance for valet parking.  Minerva Ends RN

## 2016-10-09 NOTE — Discharge Summary (Signed)
Advanced Heart Failure Team  Discharge Summary   Patient ID: Matthew Vaughan MRN: [id number], DOB/AGE: [birth year] 61 y.o. Admit date: 10/08/2016 D/C date:     10/09/2016   Primary Discharge Diagnoses:  1. HMII LVAD placed 11/2015 2. Chronic Systolic HF 3. H/O LV Thrombus 4. HTN.  5. Chronic Anticoagulation--> LVAD HMII INR Goal 2-2.5   Hospital Course:  Matthew Vaughan is a 61 y.o. male with history of nonischemic cardiomyopathy/chronic systolic CHF (EF 8% by MRI) and LV thrombus now s/p LVAD placement on 11/21/15 admitted for RHC and imaging for transplant work up.   RHC, dopplers, and [place] of abdomen completed. RHC with normal cardiac output and filling pressures. Abdominal US ok. Dopplers of carotids and extremities ok. See below for details. INR remains stable. He will continue to get INR checked in [place] every other week. Continue current dose of coumadin. He has follow up for [facility] and our LVAD clinic.   RHC 10/08/16 RA mean 3 RV 24/5 PA 26/6, mean 16 PCWP mean 6 Oxygen saturations: PA 67% AO 100% Cardiac Output (Fick) 5.58  Cardiac Index (Fick) 2.67  Lower Extremity Doppler 10/08/16   - No evidence of deep vein thrombosis involving the right lower   extremity and left lower extremity. - No evidence of Baker&'s cyst on the right or left.  Carotid US 10/08/16 There is no obvious evidence of hemodynamically signifnicant stenosis involving bilateral internal carotid arteries.  ABI 10/08/2016 Summary: Bilateral brachial, dorsalis pedis, and posterior tibial arteries were patent.  Unable to determine accurate waveforms or ankle brachial indices due to LVAD. LVAD Interrogation HM II:   Speed: 9400    Flow:  4.9  PI:  5    Power:   5.3     Back-up speed:   8800    Discharge Weight:  195 pounds.  Discharge Vitals: Blood pressure 99/81, pulse 84, temperature 98.4 F (36.9 C), temperature source Oral, resp. rate 18, height 5\' 10"  (1.778 m), weight 195 lb 4.8 oz (88.6 kg), SpO2  100 %. Mean arterial Pressure 90  Physical Exam: General:  In bed. NAD.  HEENT: Normal Neck: supple. JVP flat. Carotids 2+ bilat; no bruits. No lymphadenopathy or thyromegaly appreciated. Cor: Mechanical heart sounds with LVAD hum present. Lungs: CTAB, normal effort Abdomen: soft, NT, ND,  no HSM. No bruits or masses. +BS  Driveline: C/D/I; securement device intact and driveline incorporated Extremities: no cyanosis, clubbing, rash, edema Neuro: Alert & oriented x 3. Cranial nerves grossly intact. Moves all 4 extremities w/o difficulty. Affect pleasant    Labs: Lab Results  Component Value Date   WBC 4.3 10/09/2016   HGB 10.1 (L) 10/09/2016   HCT 31.3 (L) 10/09/2016   MCV 99.1 10/09/2016   PLT 233 10/09/2016    Recent Labs Lab 10/09/16 0326  NA 136  K 4.2  CL 102  CO2 26  BUN 14  CREATININE 1.15  CALCIUM 8.7*  GLUCOSE 118*   Lab Results  Component Value Date   CHOL 123 11/09/2015   HDL 47 11/09/2015   LDLCALC 59 11/09/2015   TRIG 85 11/09/2015   BNP (last 3 results)  Recent Labs  11/22/15 0416  BNP 585.9*    ProBNP (last 3 results) No results for input(s): PROBNP in the last 8760 hours.   Diagnostic Studies/Procedures   US Abdomen Complete  Result Date: 10/08/2016 CLINICAL DATA:  Preoperative the study prior to cardiac transplantation. History of hypertension and left ventricular assist device placement EXAM: ABDOMEN ULTRASOUND COMPLETE  COMPARISON:  CT scan of the abdomen and pelvis of 03/10/2016 FINDINGS: Gallbladder: No gallstones or wall thickening visualized. No sonographic Murphy sign noted by sonographer. Common bile duct: Diameter: 3.6 mm Liver: No focal lesion identified. Within normal limits in parenchymal echogenicity. IVC: No abnormality visualized. Pancreas: The pancreas could not be adequately assessed due to bowel gas. Spleen: Size and appearance within normal limits. Right Kidney: Length: 10.4 cm. Echogenicity within normal limits. No mass or  hydronephrosis visualized. Left Kidney: Length: 10.3 cm. Echogenicity within normal limits. No mass or hydronephrosis visualized. Abdominal aorta: Limited visualization of the mid and distal aorta. The proximal aorta measures 2.5 cm in diameter. Other findings: There is no ascites. IMPRESSION: No gallstones or sonographic evidence of acute cholecystitis. Normal appearance of the liver and spleen and kidneys. Limited visualization of the pancreas and abdominal aorta. Electronically Signed   By: David  [place] M.D.   On: 10/08/2016 11:54    Discharge Medications   Allergies as of 10/09/2016   No Known Allergies     Medication List    TAKE these medications   acetaminophen 500 MG tablet Commonly known as:  TYLENOL Take 500 mg by mouth daily as needed for moderate pain or headache.   amLODipine 5 MG tablet Commonly known as:  NORVASC Take 1 tablet (5 mg total) by mouth daily. What changed:  how much to take   amoxicillin 500 MG tablet Commonly known as:  AMOXIL Take 1 tablet (500 mg total) by mouth 2 (two) times daily. Take 4 tabs 30 - 60 minutes before dental cleanings . What changed:  how much to take  when to take this  additional instructions   Blood Pressure Cuff Misc 1 Units by Does not apply route daily.   gabapentin 300 MG capsule Commonly known as:  NEURONTIN Take 1 capsule (300 mg total) by mouth 2 (two) times daily. What changed:  when to take this  reasons to take this   lisinopril 10 MG tablet Commonly known as:  PRINIVIL,ZESTRIL Take 10 mg by mouth daily. What changed:  Another medication with the same name was removed. Continue taking this medication, and follow the directions you see here.   magnesium hydroxide 400 MG/5ML suspension Commonly known as:  MILK OF MAGNESIA Take 15 mLs by mouth daily as needed for mild constipation.   sildenafil 20 MG tablet Commonly known as:  REVATIO Take 2 tablets (40 mg total) by mouth as needed (ED).   warfarin 5 MG  tablet Commonly known as:  COUMADIN TAKE 2 TABLETS BY MOUTH EVERY DAY OR AS DIRECTED What changed:  See the new instructions.       Disposition   The patient will be discharged in stable condition to home. Discharge Instructions    (HEART FAILURE PATIENTS) Call MD:  Anytime you have any of the following symptoms: 1) 3 pound weight gain in 24 hours or 5 pounds in 1 week 2) shortness of breath, with or without a dry hacking cough 3) swelling in the hands, feet or stomach 4) if you have to sleep on extra pillows at night in order to breathe.    Complete by:  As directed    Diet - low sodium heart healthy    Complete by:  As directed    Heart Failure patients record your daily weight using the same scale at the same time of day    Complete by:  As directed    INR  Goal: 2 - 2.5  Complete by:  As directed    Goal:  2 - 2.5   Increase activity slowly    Complete by:  As directed    Page VAD Coordinator at [phone]  Notify for: any VAD alarms, sustained elevations of power >10 watts, sustained drop in Pulse Index <3    Complete by:  As directed    Notify for:   any VAD alarms sustained elevations of power >10 watts sustained drop in Pulse Index <3     Speed Settings:    Complete by:  As directed    Fixed 9400 RPM Low 8800 RPM         Duration of Discharge Encounter: Greater than 35 minutes   Signed, Amy Clegg NP-C  10/09/2016, 8:09 AM  Patient seen with NP, agree with the above note.  Exam shows no volume overload.  Labs stable today, INR 2.08, LDH stable at 234.  RHC showed normal filling pressures and preserved cardiac output.  Doppler studies unremarkable, abdominal US ok.  Studies will be sent to [facility] as part of pre-transplant workup.  He can go home today.   [name] 10/09/2016 8:37 AM

## 2016-10-12 ENCOUNTER — Other Ambulatory Visit (HOSPITAL_COMMUNITY): Payer: Self-pay | Admitting: Cardiology

## 2016-10-22 ENCOUNTER — Encounter (HOSPITAL_COMMUNITY): Payer: Medicaid Other

## 2016-10-23 LAB — PROTIME-INR: INR: 2.3 — AB (ref <=1.1)

## 2016-10-24 ENCOUNTER — Ambulatory Visit (HOSPITAL_COMMUNITY): Payer: Self-pay | Admitting: Pharmacist

## 2016-10-24 DIAGNOSIS — Z95811 Presence of heart assist device: Secondary | ICD-10-CM

## 2016-10-27 ENCOUNTER — Other Ambulatory Visit (HOSPITAL_COMMUNITY): Payer: Self-pay | Admitting: Cardiology

## 2016-10-27 DIAGNOSIS — Z95811 Presence of heart assist device: Secondary | ICD-10-CM

## 2016-10-27 DIAGNOSIS — I5022 Chronic systolic (congestive) heart failure: Secondary | ICD-10-CM

## 2016-11-06 LAB — PROTIME-INR: INR: 2.5 — AB (ref <=1.1)

## 2016-11-08 ENCOUNTER — Ambulatory Visit (HOSPITAL_COMMUNITY): Payer: Self-pay | Admitting: Pharmacist

## 2016-11-08 DIAGNOSIS — Z95811 Presence of heart assist device: Secondary | ICD-10-CM

## 2016-11-20 ENCOUNTER — Encounter (HOSPITAL_COMMUNITY): Payer: Self-pay

## 2016-11-20 ENCOUNTER — Ambulatory Visit (HOSPITAL_COMMUNITY)
Admission: RE | Admit: 2016-11-20 | Discharge: 2016-11-20 | Disposition: A | Discharge status: Home / Self Care | Payer: Medicaid Other | Source: Ambulatory Visit | Attending: Internal Medicine | Admitting: Internal Medicine

## 2016-11-20 ENCOUNTER — Ambulatory Visit (HOSPITAL_COMMUNITY): Payer: Self-pay | Admitting: Pharmacist

## 2016-11-20 VITALS — BP 117/77 | HR 85 | Resp 16 | Ht 70.0 in | Wt 199.2 lb

## 2016-11-20 DIAGNOSIS — I110 Hypertensive heart disease with heart failure: Secondary | ICD-10-CM | POA: Diagnosis not present

## 2016-11-20 DIAGNOSIS — I5022 Chronic systolic (congestive) heart failure: Secondary | ICD-10-CM | POA: Diagnosis not present

## 2016-11-20 DIAGNOSIS — I428 Other cardiomyopathies: Secondary | ICD-10-CM | POA: Diagnosis not present

## 2016-11-20 DIAGNOSIS — Z7901 Long term (current) use of anticoagulants: Secondary | ICD-10-CM | POA: Insufficient documentation

## 2016-11-20 DIAGNOSIS — N529 Male erectile dysfunction, unspecified: Secondary | ICD-10-CM | POA: Insufficient documentation

## 2016-11-20 DIAGNOSIS — Z79899 Other long term (current) drug therapy: Secondary | ICD-10-CM | POA: Diagnosis not present

## 2016-11-20 DIAGNOSIS — Z95811 Presence of heart assist device: Secondary | ICD-10-CM

## 2016-11-20 LAB — CBC
HEMATOCRIT: 37.3 % — AB (ref 39.0–52.0)
HEMOGLOBIN: 11.7 g/dL — AB (ref 13.0–17.0)
MCH: 30.7 pg (ref 26.0–34.0)
MCHC: 31.4 g/dL (ref 30.0–36.0)
MCV: 97.9 fL (ref 78.0–100.0)
Platelets: 305 10*3/uL (ref 150–400)
RBC: 3.81 MIL/uL — ABNORMAL LOW (ref 4.22–5.81)
RDW: 13.6 % (ref 11.5–15.5)
WBC: 5.7 10*3/uL (ref 4.0–10.5)

## 2016-11-20 LAB — LACTATE DEHYDROGENASE: LDH: 295 U/L — AB (ref 98–192)

## 2016-11-20 LAB — BASIC METABOLIC PANEL
Anion gap: 6 (ref 5–15)
BUN: 6 mg/dL (ref 6–20)
CALCIUM: 9 mg/dL (ref 8.9–10.3)
CO2: 26 mmol/L (ref 22–32)
CREATININE: 1.15 mg/dL (ref 0.61–1.24)
Chloride: 103 mmol/L (ref 101–111)
GFR calc non Af Amer: >60 mL/min (ref >60)
Glucose, Bld: 69 mg/dL (ref 65–99)
Potassium: 3.8 mmol/L (ref 3.5–5.1)
SODIUM: 135 mmol/L (ref 135–145)

## 2016-11-20 LAB — PROTIME-INR
INR: 2.63
Prothrombin Time: 28.6 seconds — ABNORMAL HIGH (ref 11.4–15.2)

## 2016-11-20 MED ORDER — WARFARIN SODIUM 5 MG PO TABS: 5.0000 mg | ORAL_TABLET | Freq: Every day | ORAL | 12 refills | Status: DC

## 2016-11-20 NOTE — Progress Notes (Signed)
Patient presents for 2 month  follow up in VAD Clinic today. Reports no problems with VAD equipment or concerns with drive line.  Vital Signs:  Doppler Pressure 110   Automatc BP: 117/77 (93) HR:85   SPO2:97  %  Weight: 199.2 lb w/o eqt Last weight: 198 lb Home weights: Does not wiegh   VAD Indication: DT-eval ongoing at [facility]    VAD interrogation & Equipment Management: Speed:9400 Flow: 5.0 Power:5.7 w    PI:5.6  Alarms: no clinical alarms Events: 5-10 every few days  Fixed speed 9400 Low speed limit: 8800  Primary Controller:  Replace back up battery in 66months. Back up controller:   Replace back up battery in 17 months.  Annual Equipment Maintenance on UBC/PM was performed on DUE at next visit.   I reviewed the LVAD parameters from today and compared the results to the patient's prior recorded data. LVAD interrogation was NEGATIVE for significant power changes, NEGATIVE for clinical alarms and STABLE for PI events/speed drops. No programming changes were made and pump is functioning within specified parameters. Pt is performing daily controller and system monitor self tests along with completing weekly and monthly maintenance for LVAD equipment.  LVAD equipment check completed and is in good working order. Back-up equipment present. Charged back up battery and performed self-test on equipment.   Exit Site Care: Drive line is being maintained weekly  by patient. Drive line exit site well healed and incorporated. The velour is fully implanted at exit site. Dressing dry and intact. No erythema or drainage. Stabilization device present and accurately applied. Pt denies fever or chills. Pt states they have adequate dressing supplies at home.   Significant Events on VAD Support:  none  Device:none   BP & Labs:  MAP110 - Doppler is reflecting modified systolic  Hgb 11.7 - No S/S of bleeding. Specifically denies melena/BRBPR or nosebleeds.  LDH stable at 295  with established baseline of 280- 320. Denies tea-colored urine. No power elevations noted on interrogation.    Patient completed declined 6 minute walk test.    Neurocognitive trail making completed correctly in 2.5 minutes.   [address] Cardiomyopathy, EQ-5D-3L and post-VAD QOL completed by the patient independently.   Plan-  1. Return to clinic in 2 moths 2. Refill for warfarin sent to pharmacy 3. 10 weekly dressing given 4. Plans made to bring equipment for annual maintenance.  Marcellus Scott RN VAD Coordinator   Office: [phone] 24/7 Emergency VAD Pager: [phone]

## 2016-11-20 NOTE — Progress Notes (Signed)
LVAD CLINIC NOTE Cardiology: Dr. Shirlee Latch Transplant: [facility]   Mr. Vath is a 61 yo with history of nonischemic cardiomyopathy/chronic systolic CHF (EF 8% by MRI) and LV thrombus now s/p LVAD placement on 11/21/15. Off aspirin due to intractable nose bleed.   Today he returns for LVAD/HF follow up. Overall feeling great. Denies SOB/PND/Orthopnea. Denies BRBPR/hematuria. No fever or chills. Able to walk 3-4 miles without difficulty. Weight at home 193-194 pounds. He performs driveline dressing change weekly. Taking all medications. Continues to live in [place]. INR checked in [place].       LVAD interrogation Personally Reviewed PF 5.1 Pump Speed 9400 PI 6.0 PP 5.8  Rare PI events. He also had few low voltage   Labs (5/17): creatinine 1.05 => 0.91, HCT 30, LDH 235 => 294 Labs (6/17): K 3.7, creatinine 1.17, LDL 262 => 281, HCT 36.3, INR 2.6 Labs (8/17): hgb 11.2, LDH 243, creatinine 1.09 Labs (10/17): K 4.1, creatinine 1.11, hgb 12.4, LDH 262 Labs (12/17): LDH 284, K 4.2, creatinine 1.14, hgb 11.5 Labs (10/09/2016): K 4.2 Creatinine 1.15   PMH: 1. Chronic systolic CHF: Nonischemic cardiomyopathy.  Cause uncertain => not a heavy drinker, had one sister with what sounds like sudden death. Possible viral myocarditis.   - LHC/RHC (4/27): No angiographic CAD.  Mean RA 22, PA 57/34, mean PCWP 35, CI 1.39, PVR 2.6 WU. - Echo (4/17): Severely dilated LV with EF 10-15%.  - Cardiac MRI (4/17): EF 8%, severely dilated LV, LV thrombus, mildly dilated RV with moderately decreased systolic function, patchy inferior LGE pattern could be consistent with prior myocarditis.  - Heartmate II LVAD placed 11/21/15.  2. LV thrombus.  3. HTN 4. Epistaxis  FH: No history of premature CAD or cardiomyopathy.   SH: Divorced, had been living with [place] but now in [place] with his brother, worked as Art gallery manager.  Nonsmoker.  Rare ETOH.   ROS: All systems reviewed and negative except as per HPI.   Current  Outpatient Prescriptions  Medication Sig Dispense Refill  . acetaminophen (TYLENOL) 500 MG tablet Take 500 mg by mouth daily as needed for moderate pain or headache.    Marland Kitchen amLODipine (NORVASC) 5 MG tablet Take 10 mg by mouth daily.    . Blood Pressure Monitoring (BLOOD PRESSURE CUFF) MISC 1 Units by Does not apply route daily. 1 each 0  . lisinopril (PRINIVIL,ZESTRIL) 10 MG tablet TAKE 1 TABLET EVERY DAY 30 tablet 3  . magnesium hydroxide (MILK OF MAGNESIA) 400 MG/5ML suspension Take 15 mLs by mouth daily as needed for mild constipation.    . sildenafil (REVATIO) 20 MG tablet Take 2 tablets (40 mg total) by mouth as needed (ED). 16 tablet 2  . warfarin (COUMADIN) 5 MG tablet Take 1 tablet (5 mg total) by mouth daily at 6 PM. Take 7.5 mg (1 and 1/2 tablets) daily except 10 mg (2 tablets) on Saturday and Sunday 60 tablet 12  . amoxicillin (AMOXIL) 500 MG capsule Take 2,000 mg by mouth as directed. 30-60 minutes prior to dental appointment    . gabapentin (NEURONTIN) 300 MG capsule Take 300 mg by mouth daily as needed (pain).     No current facility-administered medications for this encounter.    BP 117/77 (BP Location: Right Arm)   Pulse 85   Resp 16   Ht 5\' 10"  (1.778 m)   Wt 199 lb 3.2 oz (90.4 kg)   SpO2 97%   BMI 28.58 kg/m    [employer]  11/20/16 1014  Weight: 199 lb 3.2 oz (90.4 kg)    Vital signs:  Physical Exam: GENERAL: Well appearing, male who presents to clinic today in no acute distress. HEENT: normal  NECK: Supple, JVP flat  .  2+ bilaterally, no bruits.  No lymphadenopathy or thyromegaly appreciated.   CARDIAC:  Mechanical heart sounds with LVAD hum present.  LUNGS:  Clear to auscultation bilaterally.  ABDOMEN:  Soft, round, nontender, positive bowel sounds x4.     LVAD exit site: well-healed and incorporated.  Dressing dry and intact.  No erythema or drainage.  Stabilization device present and accurately applied.  Driveline dressing is being changed daily per  sterile technique. EXTREMITIES:  Warm and dry, no cyanosis, clubbing, rash or edema  NEUROLOGIC:  Alert and oriented x 4.  Gait steady.  No aphasia.  No dysarthria.  Affect pleasant.      Assessment/Plan: 1. HeartMate II LVAD 11/21/2015 NYHA I. Doing great. Volume status stable despite 4 pound weight gain.  Does not need lasix.  Continue current dose of lisinopril and amlodipine.  On coumadin with INR goal 2-2.5. No asa with intractable nose bleeds.   VAD parameters stable.  LDH ok 234>295 He has established at [facility] for possible heart transplant.  2. Chronic systolic CHF: As above doing great. NYHA I.  3. LV thrombus: Noted on pre-op cMRI.  He is on warfarin.  4. Erectile dysfunction: OK to use sildenafil prn.   Labs today. LDH stable. CBC, BMET, INR ok. Follow up 2 months.   Greater than 50% of the (total minutes 25) visit spent in counseling/coordination of care regarding heart failure/vad.  Discussed [facility] work up.                                                                                                                                                                               Amy Clegg NP-C  11/20/2016

## 2016-12-20 ENCOUNTER — Telehealth (HOSPITAL_COMMUNITY): Payer: Self-pay | Admitting: *Deleted

## 2016-12-20 NOTE — Telephone Encounter (Signed)
[facility] for INR results, Lab tech says pt has not been in their lab since April, 2018. Called pt, left message asking him to call VAD coordinator for INR testing plan.

## 2016-12-24 ENCOUNTER — Encounter (HOSPITAL_COMMUNITY): Payer: Self-pay | Admitting: *Deleted

## 2016-12-26 ENCOUNTER — Telehealth (HOSPITAL_COMMUNITY): Payer: Self-pay | Admitting: *Deleted

## 2016-12-26 LAB — POCT INR: INR: 3

## 2016-12-26 NOTE — Telephone Encounter (Signed)
Pt called to report he had received our messages re: INR f/u. He is "going to [facility] today" for his lab work. He also reports he has been following up with [facility] re: transplant evaluation and had several teeth pulled a few weeks ago. States he held one dose of coumadin and did take prophylactic antibiotic before procedure.

## 2016-12-27 ENCOUNTER — Ambulatory Visit (HOSPITAL_COMMUNITY): Payer: Self-pay | Admitting: Pharmacist

## 2016-12-27 DIAGNOSIS — Z95811 Presence of heart assist device: Secondary | ICD-10-CM

## 2017-01-07 ENCOUNTER — Telehealth (HOSPITAL_COMMUNITY): Payer: Self-pay | Admitting: Pharmacist

## 2017-01-07 NOTE — Telephone Encounter (Signed)
Called patient as has not gone to [facility] to have INR drawn yet. Patient states that he is not in that area presently and that he is planning to go to [facility] to get it drawn tomorrow.   Order for INR sent to [facility]. Will follow up tomorrow.  Allena Katz, Pharm.D. PGY1 Pharmacy Resident 6/25/20183:45 PM Pager [phone]

## 2017-01-07 NOTE — Telephone Encounter (Signed)
Faxed to [facility] (fax: [phone], attnBonita Quin, hematology)

## 2017-01-08 ENCOUNTER — Ambulatory Visit (HOSPITAL_COMMUNITY): Payer: Self-pay | Admitting: Pharmacist

## 2017-01-08 DIAGNOSIS — Z95811 Presence of heart assist device: Secondary | ICD-10-CM

## 2017-01-08 LAB — POCT INR: INR: 2.2

## 2017-01-14 ENCOUNTER — Other Ambulatory Visit (HOSPITAL_COMMUNITY): Payer: Self-pay | Admitting: Cardiology

## 2017-01-14 DIAGNOSIS — I10 Essential (primary) hypertension: Secondary | ICD-10-CM

## 2017-01-21 ENCOUNTER — Encounter (HOSPITAL_COMMUNITY): Payer: Self-pay | Admitting: Unknown Physician Specialty

## 2017-01-21 ENCOUNTER — Ambulatory Visit (HOSPITAL_COMMUNITY)
Admission: RE | Admit: 2017-01-21 | Discharge: 2017-01-21 | Disposition: A | Discharge status: Home / Self Care | Payer: Medicaid Other | Source: Ambulatory Visit | Attending: Internal Medicine | Admitting: Internal Medicine

## 2017-01-21 ENCOUNTER — Ambulatory Visit (HOSPITAL_COMMUNITY): Payer: Self-pay | Admitting: Pharmacist

## 2017-01-21 ENCOUNTER — Other Ambulatory Visit (HOSPITAL_COMMUNITY): Payer: Self-pay | Admitting: *Deleted

## 2017-01-21 VITALS — BP 128/0 | HR 93 | Ht 71.0 in | Wt 197.6 lb

## 2017-01-21 DIAGNOSIS — Z7901 Long term (current) use of anticoagulants: Secondary | ICD-10-CM

## 2017-01-21 DIAGNOSIS — Z79899 Other long term (current) drug therapy: Secondary | ICD-10-CM | POA: Diagnosis not present

## 2017-01-21 DIAGNOSIS — Z95811 Presence of heart assist device: Secondary | ICD-10-CM

## 2017-01-21 DIAGNOSIS — I10 Essential (primary) hypertension: Secondary | ICD-10-CM | POA: Diagnosis not present

## 2017-01-21 DIAGNOSIS — I240 Acute coronary thrombosis not resulting in myocardial infarction: Secondary | ICD-10-CM | POA: Insufficient documentation

## 2017-01-21 DIAGNOSIS — R040 Epistaxis: Secondary | ICD-10-CM | POA: Diagnosis not present

## 2017-01-21 DIAGNOSIS — N529 Male erectile dysfunction, unspecified: Secondary | ICD-10-CM | POA: Insufficient documentation

## 2017-01-21 DIAGNOSIS — I5043 Acute on chronic combined systolic (congestive) and diastolic (congestive) heart failure: Secondary | ICD-10-CM

## 2017-01-21 DIAGNOSIS — N528 Other male erectile dysfunction: Secondary | ICD-10-CM

## 2017-01-21 DIAGNOSIS — I5022 Chronic systolic (congestive) heart failure: Secondary | ICD-10-CM | POA: Diagnosis not present

## 2017-01-21 DIAGNOSIS — I428 Other cardiomyopathies: Secondary | ICD-10-CM | POA: Insufficient documentation

## 2017-01-21 DIAGNOSIS — I110 Hypertensive heart disease with heart failure: Secondary | ICD-10-CM | POA: Diagnosis not present

## 2017-01-21 LAB — PROTIME-INR
INR: 2.74
Prothrombin Time: 29.6 seconds — ABNORMAL HIGH (ref 11.4–15.2)

## 2017-01-21 LAB — LACTATE DEHYDROGENASE: LDH: 217 U/L — AB (ref 98–192)

## 2017-01-21 LAB — CBC
HCT: 34.8 % — ABNORMAL LOW (ref 39.0–52.0)
HEMOGLOBIN: 10.9 g/dL — AB (ref 13.0–17.0)
MCH: 31 pg (ref 26.0–34.0)
MCHC: 31.3 g/dL (ref 30.0–36.0)
MCV: 98.9 fL (ref 78.0–100.0)
Platelets: 304 10*3/uL (ref 150–400)
RBC: 3.52 MIL/uL — ABNORMAL LOW (ref 4.22–5.81)
RDW: 13.1 % (ref 11.5–15.5)
WBC: 4.7 10*3/uL (ref 4.0–10.5)

## 2017-01-21 LAB — BASIC METABOLIC PANEL
ANION GAP: 7 (ref 5–15)
BUN: 10 mg/dL (ref 6–20)
CO2: 26 mmol/L (ref 22–32)
Calcium: 8.9 mg/dL (ref 8.9–10.3)
Chloride: 103 mmol/L (ref 101–111)
Creatinine, Ser: 1.26 mg/dL — ABNORMAL HIGH (ref 0.61–1.24)
GFR calc Af Amer: >60 mL/min (ref >60)
GLUCOSE: 104 mg/dL — AB (ref 65–99)
POTASSIUM: 4.3 mmol/L (ref 3.5–5.1)
Sodium: 136 mmol/L (ref 135–145)

## 2017-01-21 MED ORDER — SACUBITRIL-VALSARTAN 24-26 MG PO TABS: 1.0000 | ORAL_TABLET | Freq: Two times a day (BID) | ORAL | Status: DC

## 2017-01-21 MED ORDER — TADALAFIL 20 MG PO TABS: 20.0000 mg | ORAL_TABLET | Freq: Every day | ORAL | 0 refills | Status: DC | PRN

## 2017-01-21 MED ORDER — SACUBITRIL-VALSARTAN 24-26 MG PO TABS: 1.0000 | ORAL_TABLET | Freq: Two times a day (BID) | ORAL | 6 refills | Status: DC

## 2017-01-21 NOTE — Progress Notes (Signed)
Transplant packet sent to [facility] 

## 2017-01-21 NOTE — Patient Instructions (Addendum)
1.  Stop Lisinopril for 2 days (Tues and Wed) 2.  Start Entresto twice daily - first dose Thurs. Monitor your BP Will re-check BMET in 2 weeks. 3.  No change change in coumadin dose; may eat extra helping of greens today. Re-check INR in two weeks at [facility]. 4.  Return to VAD clinic in 2 months. 5.  Will send records to [facility] for transplant eval.

## 2017-01-21 NOTE — Progress Notes (Signed)
Patient presents for 2 month  follow up in VAD Clinic today. Reports no problems with VAD equipment or concerns with drive line.  Vital Signs:  Doppler Pressure 128  Automatc BP: 121/77 (100); re-check 126/83 (98) HR:  93 SPO2: 100 %  Weight: 197.6 lb w/o eqt Last weight: 199.2 lb Home weights: Does not wiegh   VAD Indication: DT-eval ongoing at [facility] - has repeat CT chest in Aug due to "spot on lung" 01/21/17 - will send info to [facility] due to current suspension of VAD to transplant at [facility]. Dr. Gala Romney spoke with pt - he understands and agrees to same.    VAD interrogation & Equipment Management: Speed:9400 Flow: 4.6 Power:5.3 w    PI: 6.3  Alarms: few low voltage advisories Events: rare  Fixed speed 9400 Low speed limit: 8800  Primary Controller:  Replace back up battery in 15 months. Back up controller:   Replace back up battery in 15 months.  Annual Equipment Maintenance on UBC/PM was performed today.  I reviewed the LVAD parameters from today and compared the results to the patient's prior recorded data. LVAD interrogation was NEGATIVE for significant power changes, NEGATIVE for clinical alarms and STABLE for PI events/speed drops. No programming changes were made and pump is functioning within specified parameters. Pt is performing daily controller and system monitor self tests along with completing weekly and monthly maintenance for LVAD equipment.  LVAD equipment check completed and is in good working order. Back-up equipment present. Charged back up battery and performed self-test on equipment.   Exit Site Care: Drive line is being maintained weekly  by patient. Drive line exit site well healed and incorporated. The velour is fully implanted at exit site. Dressing dry and intact. No erythema or drainage. Stabilization device present and accurately applied. Pt denies fever or chills. Pt provided with 8 weekly dressing kits.    Significant Events on VAD Support:   none  Device:none   BP & Labs:  TDH741 - Doppler is reflecting modified systolic. Pt stopped Norvasc @ 1 month ago due to "vision changes"  Hgb 10.9 - No S/S of bleeding. Specifically denies melena/BRBPR or nosebleeds.  LDH stable at 217 with established baseline of 280- 320. Denies tea-colored urine. No power elevations noted on interrogation.   Patient Instructions: 1.  Stop Lisinopril for 2 days (Tues and Wed) 2.  Start Entresto twice daily - first dose Thurs. Monitor your BP Will re-check BMET in 2 weeks. 3.  No change change in coumadin dose; may eat extra helping of greens today. Re-check INR in two weeks at      [facility]. 4.  Return to VAD clinic in 2 months. 5.  Will send records to [facility] for transplant eval.    Hessie Diener RN VAD Coordinator   Office: [phone] 24/7 Emergency VAD Pager: [phone]

## 2017-01-21 NOTE — Progress Notes (Addendum)
LVAD CLINIC NOTE Cardiology: Dr. Shirlee Latch Transplant: [facility]   Matthew Vaughan is a 61 yo with history of nonischemic cardiomyopathy/chronic systolic CHF (EF 8% by MRI) and LV thrombus now s/p LVAD placement on 11/21/15. Off aspirin due to intractable nose bleed.   Today he returns for LVAD/HF follow up. Doing very well. Denies DOE, CP or edema. Not taking any diuretics. Was struggling with ED and viagra didn't work but Cialis is working. No problems with medications. No bleeding or neuro symptoms. No pump alarms or problems with driveline site. Currently undergoing w/u for OHTx at [facility]        VAD Indication: DT-eval ongoing at [facility] - has repeat CT chest in Aug due to "spot on lung" 01/21/17 - will send info to [facility] due to current suspension of VAD to transplant at [facility]. Dr. Gala Romney spoke with pt - he understands and agrees to same.    VAD interrogation & Equipment Management: Speed:9400 Flow: 4.6 Power:5.3 w PI: 6.3  Alarms: few low voltage advisories Events: rare  Fixed speed 9400 Low speed limit: 8800  Primary Controller: Replace back up battery in 15 months. Back up controller: Replace back up battery in 15 months.   Labs (5/17): creatinine 1.05 => 0.91, HCT 30, LDH 235 => 294 Labs (6/17): K 3.7, creatinine 1.17, LDL 262 => 281, HCT 36.3, INR 2.6 Labs (8/17): hgb 11.2, LDH 243, creatinine 1.09 Labs (10/17): K 4.1, creatinine 1.11, hgb 12.4, LDH 262 Labs (12/17): LDH 284, K 4.2, creatinine 1.14, hgb 11.5 Labs (10/09/2016): K 4.2 Creatinine 1.15   PMH: 1. Chronic systolic CHF: Nonischemic cardiomyopathy.  Cause uncertain => not a heavy drinker, had one sister with what sounds like sudden death. Possible viral myocarditis.   - LHC/RHC (4/27): No angiographic CAD.  Mean RA 22, PA 57/34, mean PCWP 35, CI 1.39, PVR 2.6 WU. - Echo (4/17): Severely dilated LV with EF 10-15%.  - Cardiac MRI (4/17): EF 8%, severely dilated LV, LV thrombus, mildly dilated RV with moderately  decreased systolic function, patchy inferior LGE pattern could be consistent with prior myocarditis.  - Heartmate II LVAD placed 11/21/15.  2. LV thrombus.  3. HTN 4. Epistaxis  FH: No history of premature CAD or cardiomyopathy.   SH: Divorced, had been living with Matthew Vaughan but now in 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  Nonsmoker.  Rare ETOH.   ROS: All systems reviewed and negative except as per HPI.   Current Outpatient Prescriptions  Medication Sig Dispense Refill  . acetaminophen (TYLENOL) 500 MG tablet Take 500 mg by mouth daily as needed for moderate pain or headache.    . magnesium hydroxide (MILK OF MAGNESIA) 400 MG/5ML suspension Take 15 mLs by mouth daily as needed for mild constipation.    Marland Kitchen warfarin (COUMADIN) 5 MG tablet Take 1 tablet (5 mg total) by mouth daily at 6 PM. Take 7.5 mg (1 and 1/2 tablets) daily except 10 mg (2 tablets) on Saturday and Sunday 60 tablet 12  . amLODipine (NORVASC) 5 MG tablet Take 10 mg by mouth daily.    . Blood Pressure Monitoring (BLOOD PRESSURE CUFF) MISC 1 Units by Does not apply route daily. (Patient not taking: Reported on 01/21/2017) 1 each 0  . gabapentin (NEURONTIN) 300 MG capsule Take 300 mg by mouth daily as needed (pain).    . sildenafil (REVATIO) 20 MG tablet Take 2 tablets (40 mg total) by mouth as needed (ED). (Patient not taking: Reported on 01/21/2017) 16  tablet 2  . tadalafil (CIALIS) 20 MG tablet Take 1 tablet (20 mg total) by mouth daily as needed for erectile dysfunction. 10 tablet 0   Current Facility-Administered Medications  Medication Dose Route Frequency Provider Last Rate Last Dose  . [START ON 01/24/2017] sacubitril-valsartan (ENTRESTO) 24-26 mg per tablet  1 tablet Oral BID Gricel Copen, Bevelyn Buckles, MD        Vital Signs:  Doppler Pressure 128  Automatc BP: 121/77 (100); re-check 126/83 (98) HR:  93 SPO2: 100 %  Weight: 197.6 lb w/o eqt Last weight: 199.2 lb Home weights: Does not wiegh   Physical  exam:  General:  NAD.  HEENT: normal  Neck: supple. JVP 6-7  Carotids 2+ bilat; no bruits. No lymphadenopathy or thryomegaly appreciated. Cor: LVAD hum.  Lungs: Clear. Abdomen: obese soft, nontender, non-distended. No hepatosplenomegaly. No bruits or masses. Good bowel sounds. Driveline site clean. Anchor in place.  Extremities: no cyanosis, clubbing, rash. Warm no. Trace edema  Neuro: alert & oriented x 3. No focal deficits. Moves all 4 without problem    Assessment/Plan: 1. HeartMate II LVAD 11/21/2015 -NYHA I. Doing very well. Volume status ok off lasix. -BP running high. Has cough with lisinopril and stopped amlodipine for unclear reasons -Will start Entresto 24/26 -On coumadin with INR goal 2-2.5. No asa with intractable nose bleeds.  INR 2.74. Discussed with PharmD who is managing - VAD parameters stable - Personally reviewed - LDH 217 - He has established at [facility] for possible heart transplant. Long talk about current hold on [facility] progrm for VAD to transplant surgery. Agrees to dual listing at [facility]. Will submit packet 2. Chronic systolic CHF:  - As above, doing very well. NYHA I.  3. LV thrombus:  - Noted on pre-op cMRI.  He is on warfarin.  4. Erectile dysfunction:  -No benefit from sildenafil. Will switch to Cialis 5. HTN -As above. Switching to [facility] 24/26. Will wash out lisinopril for 36 hours.   Total time spent 40 minutes. Over half that time spent discussing above.   Arvilla Meres, MD  11:45 AM

## 2017-01-22 ENCOUNTER — Telehealth (HOSPITAL_COMMUNITY): Payer: Self-pay | Admitting: *Deleted

## 2017-01-22 NOTE — Telephone Encounter (Signed)
[facility] pharmacy called to clarify if patient needed to stop lisinopril and start Entresto.  I called pharmacy back and told them yes patient has been placed on entresto 24-26 mg and needed to discontinue lisinopril.  No further questions at this time.

## 2017-01-23 ENCOUNTER — Telehealth (HOSPITAL_COMMUNITY): Payer: Self-pay | Admitting: *Deleted

## 2017-01-23 NOTE — Telephone Encounter (Signed)
Opened in error

## 2017-01-28 ENCOUNTER — Telehealth (HOSPITAL_COMMUNITY): Payer: Self-pay | Admitting: Pharmacist

## 2017-01-28 NOTE — Telephone Encounter (Signed)
Entresto PA approved by [place] Medicaid through 01/16/18.   Tyler Deis. Bonnye Fava, PharmD, BCPS, CPP Clinical Pharmacist Pager: [phone] Phone: [phone] 01/28/2017 8:59 AM

## 2017-02-01 ENCOUNTER — Telehealth (HOSPITAL_COMMUNITY): Payer: Self-pay | Admitting: *Deleted

## 2017-02-01 ENCOUNTER — Ambulatory Visit (HOSPITAL_COMMUNITY): Payer: Self-pay | Admitting: Pharmacist

## 2017-02-01 DIAGNOSIS — Z95811 Presence of heart assist device: Secondary | ICD-10-CM

## 2017-02-01 LAB — PROTIME-INR: INR: 3.8 — AB (ref <=1.1)

## 2017-02-01 NOTE — Telephone Encounter (Signed)
Called patient to remind him to have labs drawn on Monday. Patient stated he had them done today and they plan to fax the results to our office. Will follow up as needed.  Marcellus Scott RN, VAD Coordinator 24/7 pager [phone]

## 2017-02-06 LAB — PROTIME-INR: INR: 1.8 — AB (ref <=1.1)

## 2017-02-08 ENCOUNTER — Telehealth (HOSPITAL_COMMUNITY): Payer: Self-pay | Admitting: *Deleted

## 2017-02-08 ENCOUNTER — Ambulatory Visit (HOSPITAL_COMMUNITY): Payer: Self-pay | Admitting: Pharmacist

## 2017-02-08 DIAGNOSIS — Z95811 Presence of heart assist device: Secondary | ICD-10-CM

## 2017-02-08 NOTE — Telephone Encounter (Signed)
Received message from patient inquiring about his INR result from Wednesday. Upon review of the chart, we do not have any received results from this week. Personally called patient to discuss and left voicemail for him to return my call.  Marcellus Scott RN, VAD Coordinator 24/7 pager [phone]

## 2017-02-13 ENCOUNTER — Ambulatory Visit (HOSPITAL_COMMUNITY): Payer: Self-pay | Admitting: Pharmacist

## 2017-02-13 DIAGNOSIS — Z95811 Presence of heart assist device: Secondary | ICD-10-CM

## 2017-02-13 LAB — PROTIME-INR: INR: 2.4 — AB (ref <=1.1)

## 2017-02-26 ENCOUNTER — Telehealth (HOSPITAL_COMMUNITY): Payer: Self-pay | Admitting: *Deleted

## 2017-02-26 MED ORDER — FUROSEMIDE 20 MG PO TABS: 20.0000 mg | ORAL_TABLET | Freq: Every day | ORAL | 2 refills | Status: DC

## 2017-02-26 MED ORDER — POTASSIUM CHLORIDE ER 10 MEQ PO TBCR: 10.0000 meq | EXTENDED_RELEASE_TABLET | Freq: Every day | ORAL | 2 refills | Status: DC

## 2017-02-26 NOTE — Telephone Encounter (Signed)
Received call from patient stating he feels "full in his abdomen" but has unchanged eating habits. Feels that his weight is stable although he does not weigh regularly. Will have patient get a BMET with next INR check. Prescription for Lasix and Potassium sent to the pharmacy.   Marcellus Scott RN, VAD Coordinator 24/7 pager [phone]

## 2017-03-01 ENCOUNTER — Ambulatory Visit (HOSPITAL_COMMUNITY): Payer: Self-pay | Admitting: Pharmacist

## 2017-03-01 DIAGNOSIS — Z95811 Presence of heart assist device: Secondary | ICD-10-CM

## 2017-03-01 LAB — PROTIME-INR: INR: 2.3 — AB (ref <=1.1)

## 2017-03-13 ENCOUNTER — Ambulatory Visit (HOSPITAL_COMMUNITY): Payer: Self-pay | Admitting: Pharmacist

## 2017-03-13 DIAGNOSIS — Z95811 Presence of heart assist device: Secondary | ICD-10-CM

## 2017-03-13 LAB — PROTIME-INR: INR: 3.4 — AB (ref <=1.1)

## 2017-03-26 ENCOUNTER — Ambulatory Visit (HOSPITAL_COMMUNITY): Payer: Self-pay | Admitting: Pharmacist

## 2017-03-26 ENCOUNTER — Other Ambulatory Visit (HOSPITAL_COMMUNITY): Payer: Self-pay | Admitting: Unknown Physician Specialty

## 2017-03-26 ENCOUNTER — Ambulatory Visit (HOSPITAL_COMMUNITY)
Admission: RE | Admit: 2017-03-26 | Discharge: 2017-03-26 | Disposition: A | Discharge status: Home / Self Care | Payer: Medicaid Other | Source: Ambulatory Visit | Attending: Cardiology | Admitting: Cardiology

## 2017-03-26 VITALS — BP 121/74 | HR 96 | Ht 71.0 in | Wt 201.0 lb

## 2017-03-26 DIAGNOSIS — Z95811 Presence of heart assist device: Secondary | ICD-10-CM

## 2017-03-26 DIAGNOSIS — N529 Male erectile dysfunction, unspecified: Secondary | ICD-10-CM | POA: Diagnosis not present

## 2017-03-26 DIAGNOSIS — Z79899 Other long term (current) drug therapy: Secondary | ICD-10-CM | POA: Insufficient documentation

## 2017-03-26 DIAGNOSIS — I5022 Chronic systolic (congestive) heart failure: Secondary | ICD-10-CM

## 2017-03-26 DIAGNOSIS — I428 Other cardiomyopathies: Secondary | ICD-10-CM | POA: Diagnosis not present

## 2017-03-26 DIAGNOSIS — R040 Epistaxis: Secondary | ICD-10-CM | POA: Insufficient documentation

## 2017-03-26 DIAGNOSIS — I110 Hypertensive heart disease with heart failure: Secondary | ICD-10-CM | POA: Insufficient documentation

## 2017-03-26 DIAGNOSIS — I240 Acute coronary thrombosis not resulting in myocardial infarction: Secondary | ICD-10-CM | POA: Diagnosis not present

## 2017-03-26 DIAGNOSIS — Z7901 Long term (current) use of anticoagulants: Secondary | ICD-10-CM

## 2017-03-26 LAB — CBC
HEMATOCRIT: 37.1 % — AB (ref 39.0–52.0)
HEMOGLOBIN: 11.9 g/dL — AB (ref 13.0–17.0)
MCH: 31.9 pg (ref 26.0–34.0)
MCHC: 32.1 g/dL (ref 30.0–36.0)
MCV: 99.5 fL (ref 78.0–100.0)
Platelets: 285 10*3/uL (ref 150–400)
RBC: 3.73 MIL/uL — AB (ref 4.22–5.81)
RDW: 13.5 % (ref 11.5–15.5)
WBC: 3.9 10*3/uL — AB (ref 4.0–10.5)

## 2017-03-26 LAB — BASIC METABOLIC PANEL
ANION GAP: 5 (ref 5–15)
BUN: 10 mg/dL (ref 6–20)
CHLORIDE: 105 mmol/L (ref 101–111)
CO2: 25 mmol/L (ref 22–32)
Calcium: 9.1 mg/dL (ref 8.9–10.3)
Creatinine, Ser: 1.29 mg/dL — ABNORMAL HIGH (ref 0.61–1.24)
GFR calc Af Amer: >60 mL/min (ref >60)
GFR, EST NON AFRICAN AMERICAN: 59 mL/min — AB (ref >60)
Glucose, Bld: 111 mg/dL — ABNORMAL HIGH (ref 65–99)
POTASSIUM: 4.3 mmol/L (ref 3.5–5.1)
SODIUM: 135 mmol/L (ref 135–145)

## 2017-03-26 LAB — LACTATE DEHYDROGENASE: LDH: 235 U/L — AB (ref 98–192)

## 2017-03-26 LAB — PROTIME-INR
INR: 3.6
Prothrombin Time: 35.7 seconds — ABNORMAL HIGH (ref 11.4–15.2)

## 2017-03-26 NOTE — Progress Notes (Addendum)
Patient presents for 2 month  follow up in VAD Clinic today. Reports no problems with VAD equipment or concerns with drive line.  Vital Signs:  Doppler Pressure 102 Automatc BP: 121/74 (81) HR:  93 SPO2: 99 %  Weight: 201 lb w/o eqt Last weight: 197.6 lb Home weights: Does not wiegh   VAD Indication: DT-eval ongoing at [facility] - has repeat CT chest in Aug due to "spot on lung" 01/21/17 - will send info to [facility] due to current suspension of VAD to transplant at [facility]. Dr. Gala Romney spoke with pt - he understands and agrees to same.    VAD interrogation & Equipment Management (reviewed with Dr. Shirlee Latch): Speed:9400 Flow: 4.5 Power:5.4 w    PI: 5.9  Alarms: few low voltage advisories Events: 3-5 daily  Fixed speed 9400 Low speed limit: 8800  Primary Controller:  Replace back up battery in 13 months. Back up controller:   Replace back up battery in 13 months.  Annual Equipment Maintenance on UBC/PM due 5/19.  I reviewed the LVAD parameters from today and compared the results to the patient's prior recorded data. LVAD interrogation was NEGATIVE for significant power changes, NEGATIVE for clinical alarms and STABLE for PI events/speed drops. No programming changes were made and pump is functioning within specified parameters. Pt is performing daily controller and system monitor self tests along with completing weekly and monthly maintenance for LVAD equipment.  LVAD equipment check completed and is in good working order. Back-up equipment present. Charged back up battery and performed self-test on equipment.   Exit Site Care: Drive line is being maintained weekly  by patient. Drive line exit site well healed and incorporated. The velour is fully implanted at exit site. Dressing dry and intact. No erythema or drainage. Stabilization device present and accurately applied. Pt denies fever or chills. Pt provided with 10 weekly dressing kits and 5 extra anchors.   Significant Events on  VAD Support:  none  Device:none   BP & Labs:  MAP102 - Doppler is reflecting modified systolic.  Hgb 10.9 - No S/S of bleeding. Specifically denies melena/BRBPR or nosebleeds.  LDH stable at 217 with established baseline of 280- 320. Denies tea-colored urine. No power elevations noted on interrogation.   Patient Instructions: 1. Return to clinic in 2 months. 2. We will call [facility] to check on status of transplant appointment.   Carlton Adam RN VAD Coordinator   Office: [phone] 24/7 Emergency VAD Pager: [phone]     LVAD CLINIC NOTE Cardiology: Dr. Shirlee Latch  Mr. Mendibles is a 61 yo with history of nonischemic cardiomyopathy/chronic systolic CHF (EF 8% by MRI) and LV thrombus now s/p LVAD placement on 11/21/15 presents for followup of chronic systolic CHF/LVAD.      He is no longer taking ASA 81.  While on ASA, he was having 2-3 major nosebleeds a week.  None off ASA.  He has tried a couple of times to restart with no success.  He remains on warfarin with no BRBPR or melena.   He is feeling great overall.  No significant exertional dyspnea.  Really able to do what he wants.  He is living part of the time in [place] and part of the time in [place].  ED improved with Cialis.  No lightheadedness/syncope.  No chest pain.  No orthopnea/PND. Weight stable.  MAP controlled (81 today).   LVAD interrogation  See LVAD nurse's note from today.   Labs (5/17): creatinine 1.05 => 0.91, HCT 30, LDH 235 =>  294 Labs (6/17): K 3.7, creatinine 1.17, LDL 262 => 281, HCT 36.3, INR 2.6 Labs (8/17): hgb 11.2, LDH 243, creatinine 1.09 Labs (10/17): K 4.1, creatinine 1.11, hgb 12.4, LDH 262 Labs (12/17): LDH 284, K 4.2, creatinine 1.14, hgb 11.5 Labs (9/18): hgb 11.9  PMH: 1. Chronic systolic CHF: Nonischemic cardiomyopathy.  Cause uncertain => not a heavy drinker, had one sister with what sounds like sudden death. Possible viral myocarditis.   - LHC/RHC (4/27): No angiographic CAD.  Mean RA  22, PA 57/34, mean PCWP 35, CI 1.39, PVR 2.6 WU. - Echo (4/17): Severely dilated LV with EF 10-15%.  - Cardiac MRI (4/17): EF 8%, severely dilated LV, LV thrombus, mildly dilated RV with moderately decreased systolic function, patchy inferior LGE pattern could be consistent with prior myocarditis.  - Heartmate II LVAD placed 11/21/15.  2. LV thrombus.  3. HTN 4. Epistaxis  FH: No history of premature CAD or cardiomyopathy.   SH: Divorced, lives part-time in [place] and part-time in [place], worked as Art gallery manager.  Nonsmoker.  Rare ETOH.   ROS: All systems reviewed and negative except as per HPI.   Current Outpatient Prescriptions  Medication Sig Dispense Refill  . acetaminophen (TYLENOL) 500 MG tablet Take 500 mg by mouth daily as needed for moderate pain or headache.    . magnesium hydroxide (MILK OF MAGNESIA) 400 MG/5ML suspension Take 15 mLs by mouth daily as needed for mild constipation.    . potassium chloride (K-DUR) 10 MEQ tablet Take 1 tablet (10 mEq total) by mouth daily. Take daily for 3 days then as needed with each dose of Lasix 30 tablet 2  . sacubitril-valsartan (ENTRESTO) 24-26 MG Take 1 tablet by mouth 2 (two) times daily. 60 tablet 6  . tadalafil (CIALIS) 20 MG tablet Take 1 tablet (20 mg total) by mouth daily as needed for erectile dysfunction. 10 tablet 0  . Blood Pressure Monitoring (BLOOD PRESSURE CUFF) MISC 1 Units by Does not apply route daily. (Patient not taking: Reported on 01/21/2017) 1 each 0  . furosemide (LASIX) 20 MG tablet Take 1 tablet (20 mg total) by mouth daily. Take daily for 2 days then as needed (Patient taking differently: Take 20 mg by mouth daily. Pt is taking 2-3 times weekly) 30 tablet 2  . gabapentin (NEURONTIN) 300 MG capsule Take 300 mg by mouth daily as needed (pain).    Marland Kitchen warfarin (COUMADIN) 5 MG tablet Take 7.5 mg (1 and 1/2 tablets) daily except 5 mg (1 tablet) on Tuesday, Thursday, Saturday     No current facility-administered medications for  this encounter.    BP 121/74 Comment: 81-map  Pulse 96   Ht  (1.803 m)   Wt 201 lb (91.2 kg)   SpO2 96%   BMI 28.03 kg/m   Vital signs: MAP 81  General: NAD Neck: No JVD, no thyromegaly or thyroid nodule.  Lungs: Clear to auscultation bilaterally with normal respiratory effort. CV: LVAD hum  No peripheral edema.  No carotid bruit.   Abdomen: Soft, nontender, no hepatosplenomegaly, no distention.  Skin: Intact without lesions or rashes.  Neurologic: Alert and oriented x 3.  Psych: Normal affect. Extremities: No clubbing or cyanosis.  HEENT: Normal.   Assessment/Plan: 1. HeartMate II LVAD: Patient is doing very well.  LVAD parameters are stable.  MAP is controlled. He does not look volume overloaded on exam.  NYHA class I-II.  - Continue Entresto 24/26 bid.       - He takes  Lasix prn, usually 1-2 times/week.  - Continue warfarin with INR goal 2-2.5.  LDH has been stable. He is off ASA due to intractable nose bleeds when taking ASA.  - He is interested in transplant evaluation.  I will refer him to [facility] for evaluation.  2. Chronic systolic CHF: Much improved since LVAD placement, now NYHA class I-II.  He is exercising regularly.  3. LV thrombus: Noted on pre-op cMRI.  Continue warfarin.   4. Erectile dysfunction: Improved with Cialis.       Followup in 2 months.                                                                                                                                                                                                       [name] 03/26/2017

## 2017-03-27 ENCOUNTER — Telehealth (HOSPITAL_COMMUNITY): Payer: Self-pay | Admitting: *Deleted
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
## 2017-04-03 ENCOUNTER — Ambulatory Visit (HOSPITAL_COMMUNITY): Payer: Self-pay | Admitting: Pharmacist

## 2017-04-03 DIAGNOSIS — Z95811 Presence of heart assist device: Secondary | ICD-10-CM

## 2017-04-03 LAB — PROTIME-INR: INR: 3.1 — AB (ref <=1.1)

## 2017-04-10 ENCOUNTER — Ambulatory Visit (HOSPITAL_COMMUNITY): Payer: Self-pay | Admitting: Pharmacist

## 2017-04-10 DIAGNOSIS — Z95811 Presence of heart assist device: Secondary | ICD-10-CM

## 2017-04-10 LAB — PROTIME-INR: INR: 2.3 — AB (ref <=1.1)

## 2017-04-22 ENCOUNTER — Telehealth (HOSPITAL_COMMUNITY): Payer: Self-pay | Admitting: *Deleted

## 2017-04-22 ENCOUNTER — Ambulatory Visit (HOSPITAL_COMMUNITY): Payer: Self-pay | Admitting: Pharmacist

## 2017-04-22 DIAGNOSIS — Z95811 Presence of heart assist device: Secondary | ICD-10-CM

## 2017-04-22 LAB — PROTIME-INR: INR: 3.2 — AB (ref <=1.1)

## 2017-04-22 NOTE — Telephone Encounter (Signed)
Dr. Juanetta Gosling from [facility] called VAD clinic to report pt presented to their ED with CP and SOB (increasing since Thursday). No home weights reported.  Temp: 98 HR: 79 BP: 108/72 02: 99% RA  Flow: 3.9 Speed: 9400 Power 5.2 PI: 4.9 Pt denied any VAD alarms  Hgb 12.9 Creat 1.3 CXR: normal INR: 3.2 BNP: 53 Trop: .038 EKG: LBBB  Dr. Shirlee Latch spoke with Dr. Juanetta Gosling and said pt looked good; will need to see here in clinic before next f/u appt that is scheduled 05/27/17.  Called pt, left message asking him to call and schedule VAD clinic appt asap.

## 2017-04-25 ENCOUNTER — Telehealth (HOSPITAL_COMMUNITY): Payer: Self-pay | Admitting: Unknown Physician Specialty

## 2017-04-25 NOTE — Telephone Encounter (Signed)
Attempted to reach pt. Left voicemail asking pt to call the VAD office ASAP. We are concerned about him since he had a recent visit to the ED. Dr. Shirlee Latch would like to follow up with the pt in clinic sooner than his scheduled appointment.  Carlton Adam RN, BSN VAD Coordinator Pager [phone] (7am - 7am)

## 2017-04-26 ENCOUNTER — Ambulatory Visit (HOSPITAL_COMMUNITY): Payer: Self-pay | Admitting: Pharmacist

## 2017-04-26 DIAGNOSIS — Z95811 Presence of heart assist device: Secondary | ICD-10-CM

## 2017-04-26 LAB — PROTIME-INR: INR: 3.1 — AB (ref <=1.1)

## 2017-04-26 NOTE — Progress Notes (Signed)
Was not able to reach Matthew Vaughan on the phone. Left a VM with instructions and to call back to verify that he received the message.

## 2017-04-29 ENCOUNTER — Telehealth (HOSPITAL_COMMUNITY): Payer: Self-pay | Admitting: Unknown Physician Specialty
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
## 2017-04-30 ENCOUNTER — Other Ambulatory Visit (HOSPITAL_COMMUNITY): Payer: Self-pay | Admitting: *Deleted

## 2017-04-30 DIAGNOSIS — Z95811 Presence of heart assist device: Secondary | ICD-10-CM

## 2017-05-06 ENCOUNTER — Other Ambulatory Visit (HOSPITAL_COMMUNITY): Payer: Self-pay | Admitting: *Deleted

## 2017-05-06 ENCOUNTER — Ambulatory Visit (HOSPITAL_COMMUNITY)
Admission: RE | Admit: 2017-05-06 | Discharge: 2017-05-06 | Disposition: A | Discharge status: Home / Self Care | Payer: Medicaid Other | Source: Ambulatory Visit | Attending: Internal Medicine | Admitting: Internal Medicine

## 2017-05-06 ENCOUNTER — Ambulatory Visit (HOSPITAL_BASED_OUTPATIENT_CLINIC_OR_DEPARTMENT_OTHER)
Admission: RE | Admit: 2017-05-06 | Discharge: 2017-05-06 | Disposition: A | Discharge status: Home / Self Care | Payer: Medicaid Other | Source: Ambulatory Visit | Attending: Cardiology | Admitting: Cardiology

## 2017-05-06 ENCOUNTER — Ambulatory Visit (HOSPITAL_COMMUNITY): Payer: Self-pay | Admitting: Pharmacist

## 2017-05-06 DIAGNOSIS — Z95811 Presence of heart assist device: Secondary | ICD-10-CM

## 2017-05-06 DIAGNOSIS — Z7901 Long term (current) use of anticoagulants: Secondary | ICD-10-CM

## 2017-05-06 DIAGNOSIS — I5043 Acute on chronic combined systolic (congestive) and diastolic (congestive) heart failure: Secondary | ICD-10-CM

## 2017-05-06 DIAGNOSIS — I2510 Atherosclerotic heart disease of native coronary artery without angina pectoris: Secondary | ICD-10-CM | POA: Insufficient documentation

## 2017-05-06 DIAGNOSIS — I252 Old myocardial infarction: Secondary | ICD-10-CM | POA: Diagnosis not present

## 2017-05-06 DIAGNOSIS — T80219D Unspecified infection due to central venous catheter, subsequent encounter: Secondary | ICD-10-CM

## 2017-05-06 DIAGNOSIS — I10 Essential (primary) hypertension: Secondary | ICD-10-CM | POA: Insufficient documentation

## 2017-05-06 DIAGNOSIS — I080 Rheumatic disorders of both mitral and aortic valves: Secondary | ICD-10-CM | POA: Insufficient documentation

## 2017-05-06 LAB — CBC
HCT: 37 % — ABNORMAL LOW (ref 39.0–52.0)
Hemoglobin: 12.1 g/dL — ABNORMAL LOW (ref 13.0–17.0)
MCH: 33 pg (ref 26.0–34.0)
MCHC: 32.7 g/dL (ref 30.0–36.0)
MCV: 100.8 fL — ABNORMAL HIGH (ref 78.0–100.0)
Platelets: 265 10*3/uL (ref 150–400)
RBC: 3.67 MIL/uL — ABNORMAL LOW (ref 4.22–5.81)
RDW: 13.6 % (ref 11.5–15.5)
WBC: 3.9 10*3/uL — ABNORMAL LOW (ref 4.0–10.5)

## 2017-05-06 LAB — LACTATE DEHYDROGENASE: LDH: 299 U/L — ABNORMAL HIGH (ref 98–192)

## 2017-05-06 LAB — COMPREHENSIVE METABOLIC PANEL
ALBUMIN: 3.7 g/dL (ref 3.5–5.0)
ALT: 18 U/L (ref 17–63)
ANION GAP: 5 (ref 5–15)
AST: 29 U/L (ref 15–41)
Alkaline Phosphatase: 83 U/L (ref 38–126)
BILIRUBIN TOTAL: 1.2 mg/dL (ref 0.3–1.2)
BUN: 8 mg/dL (ref 6–20)
CO2: 25 mmol/L (ref 22–32)
Calcium: 8.8 mg/dL — ABNORMAL LOW (ref 8.9–10.3)
Chloride: 105 mmol/L (ref 101–111)
Creatinine, Ser: 1.3 mg/dL — ABNORMAL HIGH (ref 0.61–1.24)
GFR calc Af Amer: >60 mL/min (ref >60)
GFR, EST NON AFRICAN AMERICAN: 59 mL/min — AB (ref >60)
GLUCOSE: 98 mg/dL (ref 65–99)
POTASSIUM: 4.4 mmol/L (ref 3.5–5.1)
Sodium: 135 mmol/L (ref 135–145)
TOTAL PROTEIN: 8.7 g/dL — AB (ref 6.5–8.1)

## 2017-05-06 LAB — ECHOCARDIOGRAM COMPLETE
Height: 71 in
WEIGHTICAEL: 3222.4 [oz_av]

## 2017-05-06 LAB — PROTIME-INR
INR: 3.07
PROTHROMBIN TIME: 31.5 s — AB (ref 11.4–15.2)

## 2017-05-06 NOTE — Progress Notes (Addendum)
Patient presents for sick visit following local ED visit at [facility] on 04/22/17 for c/o of CP and SOB. Matthew Vaughan spoke with ED doctor during that visit with no interventions required.  Pt was to f/u in VAD clinic here asap. Pt has echo scheduled later today, but states he "can't stay". Echo lab contacted and will work patient in if possible.   During telephone f/u per VAD coordinator on 10/15/18pt admitted prior to ED visit on 04/22/17 "while driving he had a moment where he couldn't remember anything that happened and felt unclear as to where he was or how he got there." Pt admits to having @ 3 - 4 events similar to this over the past 6 months. He drinks water after he has these feelings and symptoms slowly improve. States they lasted longer on 04/22/17 which prompted him to go to local ED.  Stressed importance of calling VAD pager for any problem like this and especially before presenting to local ED. PT verbalized agreement to do the same.    During medication review, pt admitted to usually taking prn Lasix 20 mg 4 - 4 times a week, but recently has increased to daily (did take additional doses prior to episode on 04/22/17). VAD history revealed 70 PI events on 04/22/17 which is very unusual for patient; trend is usually 3 - 5/day. Pt also admits to taking laxatives more frequently. States he takes the Lasix and laxative for abdominal "bloating".   Reports no problems with VAD equipment or concerns with drive line.  Vital Signs:  Doppler Pressure 120 Automatc BP: 102/62 (79) HR:  90 SPO2: 100 %  Weight: 201.4 lb w/o eqt Last weight: 201 lb Home weights: Does not wiegh   VAD Indication: DT-eval ongoing at [facility] - requested repeat CT chest in Aug due to "spot on lung" 01/21/17 - info sent to [facility]. Will contact [facility] today (05/06/17) and check on status of evaluation (patient has "not heard from them")  VAD interrogation & Equipment Management (reviewed with Matthew Vaughan): Speed:9400 Flow:  4.3 Power:5.3 w    PI:  6.0  Alarms: few low voltage advisories Events: 3-5 daily; 70 on 04/22/17  Fixed speed 9400 Low speed limit: 8800  Primary Controller:  Replace back up battery in 12 months. Back up controller:   Replace back up battery in 8 months.  Annual Equipment Maintenance on UBC/PM due 5/19.  Patient completed 1750 feet during 6 minute walk test. Tolerated without symptoms of SOB or fatigue.   Neurocognitive trail making completed correctly in 120 seconds.    [address] Cardiomyopathy, EQ-5D-3L and post-VAD QOL completed by the patient independently.   I reviewed the LVAD parameters from today and compared the results to the patient's prior recorded data. LVAD interrogation was NEGATIVE for significant power changes, NEGATIVE for clinical alarms and STABLE for PI events/speed drops except on 04/22/17. No programming changes were made and pump is functioning within specified parameters. Pt is performing daily controller and system monitor self tests along with completing weekly and monthly maintenance for LVAD equipment.  LVAD equipment check completed and is in good working order. Back-up equipment present. Charged back up battery and performed self-test on equipment.   Exit Site Care: Drive line is being maintained weekly  by patient. Drive line exit site well healed and incorporated. The velour is fully implanted at exit site. Dressing dry and intact. No erythema or drainage. Stabilization device present and accurately applied. Pt denies fever or chills. Pt provided with  8 weekly dressing kits.   Significant Events on VAD Support:  none  Device:none   BP & Labs:  MAP102 - Doppler is reflecting modified systolic.  Hgb 12.1- No S/S of bleeding. Specifically denies melena/BRBPR or nosebleeds.  LDH pending; established baseline of 280- 320. Denies tea-colored urine. No power elevations noted on interrogation.   Patient Instructions: 1. Stop prn lasix  unless pedal edema or SOB with weight gain of > 2 lbs overnight or > 5 lbs in one week. 2. Stop bowel cleansing.  3. Return to clinic in 2 weeks for INR and BP check.  4. Return to VAD clinic on scheduled appointment of 05/27/17. 5. We will call [facility] to check on status of transplant appointment.  6.  Call VAD pager if any issues with VAD equipment or return of any symptoms as reported above.   Matthew DienerMolly Reece RN VAD Coordinator   Office: [phone] 24/7 Emergency VAD Pager: [phone]     LVAD CLINIC NOTE Cardiology: Dr. Shirlee LatchMcLean  Matthew Vaughan is a 61 yo with history of nonischemic cardiomyopathy/chronic systolic CHF (EF 8% by MRI) and LV thrombus now s/p LVAD placement on 11/21/15 presents for followup of chronic systolic CHF/LVAD.      He is no longer taking ASA 81.  While on ASA, he was having 2-3 major nosebleeds a week.  None off ASA.  He has tried a couple of times to restart with no success.  He remains on warfarin with no BRBPR or melena.   [facility] with shortness of breath and chest pain.  The day this occurred, he had about 70 PI events.  He reports taking Lasix on most days and using laxatives because he does not like his abdomen to appear distended.  He has had no recurrence of the symptoms that sent him to the ER.  No BRBPR/melena.  No significant dyspnea with his daily activities.   LVAD interrogation  See LVAD nurse's note from today.   Labs (5/17): creatinine 1.05 => 0.91, HCT 30, LDH 235 => 294 Labs (6/17): K 3.7, creatinine 1.17, LDL 262 => 281, HCT 36.3, INR 2.6 Labs (8/17): hgb 11.2, LDH 243, creatinine 1.09 Labs (10/17): K 4.1, creatinine 1.11, hgb 12.4, LDH 262 Labs (12/17): LDH 284, K 4.2, creatinine 1.14, hgb 11.5 Labs (9/18): hgb 11.9  PMH: 1. Chronic systolic CHF: Nonischemic cardiomyopathy.  Cause uncertain => not a heavy drinker, had one sister with what sounds like sudden death. Possible viral myocarditis.   - LHC/RHC (4/27):  No angiographic CAD.  Mean RA 22, PA 57/34, mean PCWP 35, CI 1.39, PVR 2.6 WU. - Echo (4/17): Severely dilated LV with EF 10-15%.  - Cardiac MRI (4/17): EF 8%, severely dilated LV, LV thrombus, mildly dilated RV with moderately decreased systolic function, patchy inferior LGE pattern could be consistent with prior myocarditis.  - Heartmate II LVAD placed 11/21/15.  2. LV thrombus.  3. HTN 4. Epistaxis  FH: No history of premature CAD or cardiomyopathy.   SH: Divorced, [place] part[place] in [place] and [place] in [place], worked as Art gallery managerengineer.  Nonsmoker.  Rare ETOH.   ROS: All systems reviewed and negative except as per HPI.   Current Outpatient Prescriptions  Medication Sig Dispense Refill  . furosemide (LASIX) 20 MG tablet Take 1 tablet (20 mg total) by mouth daily. Take daily for 2 days then as needed (Patient taking differently: Take 20 mg by mouth daily. Pt is taking 2-3 times weekly)  30 tablet 2  . potassium chloride (K-DUR) 10 MEQ tablet Take 1 tablet (10 mEq total) by mouth daily. Take daily for 3 days then as needed with each dose of Lasix 30 tablet 2  . sacubitril-valsartan (ENTRESTO) 24-26 MG Take 1 tablet by mouth 2 (two) times daily. 60 tablet 6  . warfarin (COUMADIN) 5 MG tablet Take  5 mg (1 tablet) daily except 7.5 mg (1 and 1/2 tablets) on Monday, Friday    . acetaminophen (TYLENOL) 500 MG tablet Take 500 mg by mouth daily as needed for moderate pain or headache.    . Blood Pressure Monitoring (BLOOD PRESSURE CUFF) MISC 1 Units by Does not apply route daily. (Patient not taking: Reported on 01/21/2017) 1 each 0  . gabapentin (NEURONTIN) 300 MG capsule Take 300 mg by mouth daily as needed (pain).    . magnesium hydroxide (MILK OF MAGNESIA) 400 MG/5ML suspension Take 15 mLs by mouth daily as needed for mild constipation.    . tadalafil (CIALIS) 20 MG tablet Take 1 tablet (20 mg total) by mouth daily as needed for erectile dysfunction. (Patient not taking: Reported on 05/06/2017)  10 tablet 0   No current facility-administered medications for this encounter.    BP (!) 120/0 Comment: Doppler modified systolic - ot has LVAD  Pulse 90   Ht 5\' 11"  (1.803 m)   Wt 201 lb 6.4 oz (91.4 kg)   SpO2 100%   BMI 28.09 kg/m   Vital signs: MAP 78 GENERAL: Well appearing this am. NAD.  HEENT: Normal. NECK: Supple, JVP 7 cm. Carotids OK.  CARDIAC:  Mechanical heart sounds with LVAD hum present.  LUNGS:  CTAB, normal effort.  ABDOMEN:  NT, ND, no HSM. No bruits or masses. +BS  LVAD exit site: Well-healed and incorporated. Dressing dry and intact. No erythema or drainage. Stabilization device present and accurately applied. Driveline dressing changed daily per sterile technique. EXTREMITIES:  Warm and dry. No cyanosis, clubbing, rash, or edema.  NEUROLOGIC:  Alert & oriented x 3. Cranial nerves grossly intact. Moves all 4 extremities w/o difficulty. Affect pleasant    Assessment/Plan: 1. HeartMate II LVAD: Patient is doing well symptomatically, NYHA class I-II.  Recent visit to the ER appears to have been triggered by dehydration in the setting of frequent Lasix and laxative use (many PI events).  On exam today, he is not volume overloaded. MAP is controlled. - Continue Entresto 24/26 bid.       - I asked him not to take Lasix regularly, try to keep use rare.  Avoid laxative use.  - Continue warfarin with INR goal 2-2.5.  LDH has been stable. He is off ASA due to intractable nose bleeds when taking ASA.  - He is interested in transplant evaluation.  I have referred him to [facility] for evaluation, waiting to hear about appt.  - He will get echo and labs today.  2. Chronic systolic CHF: Much improved since LVAD placement, now NYHA class I-II.  He is exercising regularly.  3. LV thrombus: Noted on pre-op cMRI.  Continue warfarin.   4. Erectile dysfunction: Improved with Cialis.  [name] 05/06/2017

## 2017-05-06 NOTE — Progress Notes (Signed)
  Echocardiogram 2D Echocardiogram has been performed.  [name] M 05/06/2017, 11:19 AM

## 2017-05-06 NOTE — Patient Instructions (Signed)
  Patient Instructions: 1. Stop prn lasix unless pedal edema or SOB with weight gain of > 2 lbs overnight or > 5 lbs in one week. 2. Stop bowel cleansing.  3. Return to clinic in 2 weeks for INR and BP check.  4. Return to VAD clinic on scheduled appointment of 05/27/17. 5. We will call [facility] to check on status of transplant appointment.  6.  Call VAD pager if any issues with VAD equipment or return of any symptoms as reported above.

## 2017-05-17 ENCOUNTER — Other Ambulatory Visit (HOSPITAL_COMMUNITY): Payer: Self-pay | Admitting: Unknown Physician Specialty

## 2017-05-17 ENCOUNTER — Telehealth: Payer: Self-pay | Admitting: Nurse Practitioner

## 2017-05-17 ENCOUNTER — Other Ambulatory Visit: Payer: Self-pay | Admitting: Cardiology

## 2017-05-17 DIAGNOSIS — Z95811 Presence of heart assist device: Secondary | ICD-10-CM

## 2017-05-17 DIAGNOSIS — Z7901 Long term (current) use of anticoagulants: Secondary | ICD-10-CM

## 2017-05-17 MED ORDER — TADALAFIL 5 MG PO TABS
5.0000 mg | ORAL_TABLET | Freq: Every day | ORAL | 1 refills | Status: DC
Start: 2017-05-17 — End: 2017-10-11

## 2017-05-17 NOTE — Telephone Encounter (Signed)
Answered page after hours, patient requested daily Cialis 5 mg instead of 20 mg prn. Patient takes for ED, not PH. Discussed with Dr. Gala Romney who ok'd this. Called into [facility] per patient request.   Matthew Vaughan 7:08 PM

## 2017-05-20 ENCOUNTER — Ambulatory Visit (HOSPITAL_COMMUNITY)
Admission: RE | Admit: 2017-05-20 | Discharge: 2017-05-20 | Disposition: A | Discharge status: Home / Self Care | Payer: Medicaid Other | Source: Ambulatory Visit | Attending: Cardiology | Admitting: Cardiology

## 2017-05-20 ENCOUNTER — Ambulatory Visit (HOSPITAL_COMMUNITY): Payer: Self-pay | Admitting: Pharmacist

## 2017-05-20 DIAGNOSIS — Z95811 Presence of heart assist device: Secondary | ICD-10-CM | POA: Insufficient documentation

## 2017-05-20 DIAGNOSIS — Z7901 Long term (current) use of anticoagulants: Secondary | ICD-10-CM | POA: Diagnosis not present

## 2017-05-20 LAB — PROTIME-INR
INR: 2.16
Prothrombin Time: 23.9 seconds — ABNORMAL HIGH (ref 11.4–15.2)

## 2017-05-20 NOTE — Addendum Note (Signed)
Encounter addended by: Lebron Quam, RN on: 05/20/2017 10:36 AM  Actions taken: Sign clinical note

## 2017-05-20 NOTE — Progress Notes (Signed)
Patient presents for Nurse visit/BP check in VAD Clinic today. Reports no problems with VAD equipment or concerns with drive line. Pt reports that he has only taken Lasix x 2 since his last visit. Pt states that sometimes he doesn't take Entresto twice daily. Pt was informed that he probably would not need the Lasix if he would take the Entresto twice daily as prescribed. Pt verbalized understanding of this information and states he will stop skipping his evening dose of Entresto.   Vital Signs:  Doppler Pressure: 88   Automatc BP: 123/70 (107) HR:60  SPO2: 97  %  VAD interrogation & Equipment Management: Speed: 9400 Flow: 4.1 Power: 5.2   PI: 5.2  Alarms: no clinical alarms Events: 3-5 PI daily  Fixed speed: 9400 Low speed limit: 8800  Primary Controller:  Replace back up battery in 11 months. Back up controller:   Replace back up battery in 11 months.  Return to clinic in 1 month for VAD visit.   Carlton Adam RN VAD Coordinator   Office: [phone] 24/7 Emergency VAD Pager: [phone]

## 2017-05-27 ENCOUNTER — Encounter (HOSPITAL_COMMUNITY): Payer: Medicaid Other

## 2017-06-03 ENCOUNTER — Ambulatory Visit (HOSPITAL_COMMUNITY): Payer: Self-pay | Admitting: Pharmacist

## 2017-06-03 DIAGNOSIS — Z95811 Presence of heart assist device: Secondary | ICD-10-CM

## 2017-06-03 LAB — PROTIME-INR: INR: 1.6 — AB (ref <=1.1)

## 2017-06-07 ENCOUNTER — Other Ambulatory Visit (HOSPITAL_COMMUNITY): Payer: Self-pay | Admitting: Cardiology

## 2017-06-21 ENCOUNTER — Ambulatory Visit (HOSPITAL_COMMUNITY): Payer: Self-pay | Admitting: Unknown Physician Specialty

## 2017-06-21 DIAGNOSIS — Z95811 Presence of heart assist device: Secondary | ICD-10-CM

## 2017-06-21 LAB — PROTIME-INR: INR: 2 — AB (ref 0.9–1.1)

## 2017-06-26 ENCOUNTER — Encounter (HOSPITAL_COMMUNITY): Payer: Medicaid Other

## 2017-07-04 ENCOUNTER — Encounter (HOSPITAL_COMMUNITY): Payer: Medicaid Other

## 2017-07-10 ENCOUNTER — Ambulatory Visit (HOSPITAL_COMMUNITY): Payer: Self-pay | Admitting: Pharmacist

## 2017-07-10 ENCOUNTER — Other Ambulatory Visit (HOSPITAL_COMMUNITY): Payer: Self-pay | Admitting: *Deleted

## 2017-07-10 ENCOUNTER — Ambulatory Visit (HOSPITAL_COMMUNITY)
Admission: RE | Admit: 2017-07-10 | Discharge: 2017-07-10 | Disposition: A | Discharge status: Home / Self Care | Payer: Medicaid Other | Source: Ambulatory Visit | Attending: Internal Medicine | Admitting: Internal Medicine

## 2017-07-10 VITALS — BP 118/0 | HR 88 | Ht 71.0 in | Wt 205.9 lb

## 2017-07-10 DIAGNOSIS — Z7901 Long term (current) use of anticoagulants: Secondary | ICD-10-CM

## 2017-07-10 DIAGNOSIS — I110 Hypertensive heart disease with heart failure: Secondary | ICD-10-CM | POA: Diagnosis present

## 2017-07-10 DIAGNOSIS — N529 Male erectile dysfunction, unspecified: Secondary | ICD-10-CM | POA: Diagnosis not present

## 2017-07-10 DIAGNOSIS — I5043 Acute on chronic combined systolic (congestive) and diastolic (congestive) heart failure: Secondary | ICD-10-CM

## 2017-07-10 DIAGNOSIS — I5022 Chronic systolic (congestive) heart failure: Secondary | ICD-10-CM | POA: Insufficient documentation

## 2017-07-10 DIAGNOSIS — Z79899 Other long term (current) drug therapy: Secondary | ICD-10-CM | POA: Diagnosis not present

## 2017-07-10 DIAGNOSIS — Z95811 Presence of heart assist device: Secondary | ICD-10-CM | POA: Insufficient documentation

## 2017-07-10 DIAGNOSIS — I10 Essential (primary) hypertension: Secondary | ICD-10-CM

## 2017-07-10 DIAGNOSIS — I429 Cardiomyopathy, unspecified: Secondary | ICD-10-CM | POA: Diagnosis present

## 2017-07-10 LAB — CBC
HEMATOCRIT: 37.8 % — AB (ref 39.0–52.0)
HEMOGLOBIN: 12.4 g/dL — AB (ref 13.0–17.0)
MCH: 33.2 pg (ref 26.0–34.0)
MCHC: 32.8 g/dL (ref 30.0–36.0)
MCV: 101.3 fL — ABNORMAL HIGH (ref 78.0–100.0)
Platelets: 267 10*3/uL (ref 150–400)
RBC: 3.73 MIL/uL — ABNORMAL LOW (ref 4.22–5.81)
RDW: 13.2 % (ref 11.5–15.5)
WBC: 4.7 10*3/uL (ref 4.0–10.5)

## 2017-07-10 LAB — BASIC METABOLIC PANEL
ANION GAP: 8 (ref 5–15)
BUN: 8 mg/dL (ref 6–20)
CALCIUM: 9.1 mg/dL (ref 8.9–10.3)
CHLORIDE: 104 mmol/L (ref 101–111)
CO2: 25 mmol/L (ref 22–32)
Creatinine, Ser: 1.36 mg/dL — ABNORMAL HIGH (ref 0.61–1.24)
GFR calc non Af Amer: 55 mL/min — ABNORMAL LOW (ref >60)
Glucose, Bld: 113 mg/dL — ABNORMAL HIGH (ref 65–99)
Potassium: 3.9 mmol/L (ref 3.5–5.1)
Sodium: 137 mmol/L (ref 135–145)

## 2017-07-10 LAB — LACTATE DEHYDROGENASE: LDH: 284 U/L — ABNORMAL HIGH (ref 98–192)

## 2017-07-10 LAB — PROTIME-INR
INR: 2.04
Prothrombin Time: 22.9 seconds — ABNORMAL HIGH (ref 11.4–15.2)

## 2017-07-10 MED ORDER — SACUBITRIL-VALSARTAN 49-51 MG PO TABS: 1.0000 | ORAL_TABLET | Freq: Two times a day (BID) | ORAL | 11 refills | Status: DC

## 2017-07-10 NOTE — Patient Instructions (Addendum)
1. No change in coumadin dose. Re-check INR in two weeks at [facility]. 2. Increase Entresto to 49/51 mg tabs twice daily. 3. Re-schedule appointment with Dr. Aundria Rud at [facility] for transplant evaluation. 4. Return to VAD clinic in 1 month

## 2017-07-10 NOTE — Progress Notes (Addendum)
Patient presents for 2 mo f/u (re-scheduled his 1 mo f/u).  Reports no problems with VAD equipment or concerns with drive line.   Pt admits to cancelling appointments with [facility]und[facility]ers at [facility] on Nov 12 and Nov 26 and confirms he has not re-scheduled. Is concerned about the long drive.   Pt states he is taking less prn Lasix and has been taking his Entresto twice daily. BP elevated at this visit, pt admits he didn't take Entresto this am, and "might have forgotten" last night.  Vital Signs:  Doppler Pressure 118 Automatc BP:  112/85 (97) HR:  88 SPO2: 99% RA  Weight: 205.9 lb w/o eqt Last weight: 201 lb Home weights: Does not wiegh   VAD Indication: [facility] - requested repeat CT chest in Aug due to "spot on lung" 01/21/17 - [facility]. Pt cancelled appointment with Dr. Aundria Rudogers on 05/27/17 and 06/10/17  VAD interrogation & Equipment Management (reviewed with Dr. Shirlee LatchMcLean): Speed:9400 Flow: 4.8 Power:5.3 w    PI:  5.6  Alarms: few low voltage advisories Events: rare  Fixed speed 9400 Low speed limit: 8800  Primary Controller:  Replace back up battery in 10 months Back up controller:   Replace back up battery in 6 months.  Annual Equipment Maintenance on UBC/PM due 5/19.  Pt has damage to primary controller white power lead. Controller PC [id number] replaced with [id number]. Pt performed controller change out without any issues.  Back up controller 11V  battery at end of life; replaced this visit. Controller placed on batteries to "top off" battery; pt will remove power source when back up battery "Completely Charged" message appears on screen.  Both primary and back up controller programmed at fixed speed 9400; low speed limit 8800  Primary Controller:  Replace back up battery in 32 months Back up controller:   Replace back up battery in  25 months.  I reviewed the LVAD parameters from today and compared the results to the patient's prior  recorded data. LVAD interrogation was NEGATIVE for significant power changes, NEGATIVE for clinical alarms and STABLE for PI events/speed drops except on 04/22/17. No programming changes were made and pump is functioning within specified parameters. Pt is performing daily controller and system monitor self tests along with completing weekly and monthly maintenance for LVAD equipment.  LVAD equipment check completed and is in good working order. Back-up equipment present. Charged back up battery and performed self-test on equipment.   Exit Site Care: Drive line is being maintained weekly  by patient. Drive line exit site well healed and incorporated. The velour is fully implanted at exit site. Dressing dry and intact. No erythema or drainage. Stabilization device present and accurately applied. Pt denies fever or chills. Pt provided with 8 weekly dressing kits.   Significant Events on VAD Support:  none  Device:none  BP & Labs:  Doppler BP 118 - Doppler is reflecting modified systolic.  Hgb  12.4 - No S/S of bleeding. Specifically denies melena/BRBPR or nosebleeds.  LDH 284 - established baseline of 280- 320. Denies tea-colored urine. No power elevations noted on interrogation.   Patient Instructions: 1. No change in coumadin dose. [facility]. 2. Increase Entresto to 49/51 mg tabs twice daily. 3. Re-schedule appointment with Dr. [facility]und[facility]ers at [facility] for transplant evaluation. 4. Return to VAD clinic in 1 mont     Hessie DienerMolly Lillianah Swartzentruber RN VAD Coordinator   Office: [phone] 24/7  Emergency VAD Pager: [phone]

## 2017-07-10 NOTE — Progress Notes (Signed)
LVAD CLINIC NOTE Cardiology: Dr. Shirlee Latch  [name] is a 61 yo with history of nonischemic cardiomyopathy/chronic systolic CHF (EF 8% by MRI) and LV thrombus now s/p LVAD placement on 11/21/15 presents for followup of chronic systolic CHF/LVAD.      He is no longer taking ASA 81.  While on ASA, he was having 2-3 major nosebleeds a week.  None off ASA.  He has tried a couple of times to restart with no success.  He remains on warfarin with no BRBPR or melena.   At last visit was seen for sick visit with SOB, periods of lightheadedness, and frequent PI events. Lasix decreased to as needed. Also educated on overuse of laxatives. Has been doing much better since. Denies any recurrence of symptoms. No lightheadedness, no SOB, no BRPBR or melena. He missed his two appts at [facility] and has yet to rescheduled. Denies dyspnea with ADLs. No problems with his driveline or equipment.  Echo 05/06/2017 LVEF remains 10-15%, mild AI, Mild MR  LVAD interrogation ]  VAD Indication: DT-eval ongoing at [facility] - requested repeat CT chest in Aug due to "spot on lung" 01/21/17 - info sent to [facility]. Pt cancelled appointment with Dr. Aundria Rud on 05/27/17 and 06/10/17  VAD interrogation & Equipment Management (reviewed with Dr. Shirlee Latch): Speed:9400 Flow: 4.8 Power:5.3 w PI:  5.6  Alarms: few low voltage advisories Events: rare  Fixed speed 9400 Low speed limit: 8800  Primary Controller: Replace back up battery in 10 months Back up controller: Replace back up battery in 6 months.  Labs (5/17): creatinine 1.05 => 0.91, HCT 30, LDH 235 => 294 Labs (6/17): K 3.7, creatinine 1.17, LDL 262 => 281, HCT 36.3, INR 2.6 Labs (8/17): hgb 11.2, LDH 243, creatinine 1.09 Labs (10/17): K 4.1, creatinine 1.11, hgb 12.4, LDH 262 Labs (12/17): LDH 284, K 4.2, creatinine 1.14, hgb 11.5 Labs (9/18): hgb 11.9  PMH: 1. Chronic systolic CHF: Nonischemic cardiomyopathy.  Cause uncertain => not a heavy drinker, had one sister  with what sounds like sudden death. Possible viral myocarditis.   - LHC/RHC (4/27): No angiographic CAD.  Mean RA 22, PA 57/34, mean PCWP 35, CI 1.39, PVR 2.6 WU. - Echo (4/17): Severely dilated LV with EF 10-15%.  - Cardiac MRI (4/17): EF 8%, severely dilated LV, LV thrombus, mildly dilated RV with moderately decreased systolic function, patchy inferior LGE pattern could be consistent with prior myocarditis.  - Heartmate II LVAD placed 11/21/15.  2. LV thrombus.  3. HTN 4. Epistaxis  FH: No history of premature CAD or cardiomyopathy.   SH: Divorced, lives part-time in [place] and part-time in [place], worked as Art gallery manager.  Nonsmoker.  Rare ETOH.   Review of systems complete and found to be negative unless listed in HPI.    Current Outpatient Medications  Medication Sig Dispense Refill  . acetaminophen (TYLENOL) 500 MG tablet Take 500 mg by mouth daily as needed for moderate pain or headache.    . Blood Pressure Monitoring (BLOOD PRESSURE CUFF) MISC 1 Units by Does not apply route daily. 1 each 0  . magnesium hydroxide (MILK OF MAGNESIA) 400 MG/5ML suspension Take 15 mLs by mouth daily as needed for mild constipation.    . sacubitril-valsartan (ENTRESTO) 24-26 MG Take 1 tablet by mouth 2 (two) times daily. 60 tablet 6  . tadalafil (CIALIS) 5 MG tablet Take 1 tablet (5 mg total) by mouth daily. 30 tablet 1  . warfarin (COUMADIN) 5 MG tablet Take  5  mg (1 tablet) daily except 7.5 mg (1 and 1/2 tablets) on Monday, Friday    . furosemide (LASIX) 20 MG tablet Take 1 tablet (20 mg total) by mouth as needed. (Patient not taking: Reported on 07/10/2017) 30 tablet 2  . potassium chloride (K-DUR) 10 MEQ tablet Take 1 tablet (10 mEq total) by mouth daily. Take daily for 3 days then as needed with each dose of Lasix 30 tablet 2   No current facility-administered medications for this encounter.    BP (!) 118/0 Comment: Doppler modified systolic - pt has LVAD  Pulse 88   Ht 5\' 11"  (1.803 m)   Wt  205 lb 14.4 oz (93.4 kg)   SpO2 99%   BMI 28.72 kg/m     Vital Signs:  Doppler Pressure 118 Automatc BP:  112/85 (97) HR:  88 SPO2: 99% RA  Weight: 205.9 lb w/o eqt Last weight: 201 lb Home weights: Does not wiegh  Vital signs: GENERAL: Well appearing this am. NAD.  HEENT: Normal. NECK: Supple, JVP 7-8 cm. Carotids OK.  CARDIAC:  Mechanical heart sounds with LVAD hum present.  LUNGS:  CTAB, normal effort.  ABDOMEN:  NT, ND, no HSM. No bruits or masses. +BS  LVAD exit site: Well-healed and incorporated. Dressing dry and intact. No erythema or drainage. Stabilization device present and accurately applied. Driveline dressing changed daily per sterile technique. EXTREMITIES:  Warm and dry. No cyanosis, clubbing, rash, or edema.  NEUROLOGIC:  Alert & oriented x 3. Cranial nerves grossly intact. Moves all 4 extremities w/o difficulty. Affect pleasant     Assessment/Plan: 1. HeartMate II LVAD: Patient is doing well symptomatically, NYHA class I-II.  Recent visit to the ER appears to have been triggered by dehydration in the setting of frequent Lasix and laxative use (many PI events). - NYHA II symptoms by report.  - Volume status stable on exam. - MAP elevated today, but did not take meds this morning, and thinks he missed Entresto last night too.  - Continue Entresto 24/26 bid.       - Continue lasix prn only. Avoid laxative use.  - Continue warfarin with INR goal 2-2.5.  LDH has been stable. He is off ASA due to intractable nose bleeds when taking ASA.  - Echo 05/06/2017 LVEF remains 10-15%, mild AI, Mild MR - Needs to proceed with Transplant evaluation at [facility].  2. Chronic systolic CHF:  - Much improved since LVAD placement, now NYHA class I-II.  He is exercising regularly.  - Volume status stable as above.  3. LV thrombus: Noted on pre-op cMRI - Continue coumadin. Denies bleeding. INR stable at 22.9 today.  4. Erectile dysfunction:  - Resolved with cialis.  Graciella FreerMichael Andrew Tillery, PA-C  07/10/2017   Patient seen and examined with the above-signed Advanced Practice Provider and/or Housestaff. I personally reviewed laboratory data, imaging studies and relevant notes. I independently examined the patient and formulated the important aspects of the plan. I have edited the note to reflect any of my changes or salient points. I have personally discussed the plan with the patient and/or family.  He is doing very well with VAD therapy now almost 2 years out. He is NYHA I. Volume status looks good.   MAPs are elevated in setting of missing several doses of Entresto. He has [facility].    Will increase Entresto to 49/51 bid.   Long talk with him about the future and the fact that VAD life expectancy is about 5 years and that if he doesn't start preparing for the future may not be alive in a few years. We discussed the transplant process and I suggested he consider going to [facility] to get more information on thsi before it is too late.  I am not convinced he will follow through.   VAD interrogated personally. Parameters stable.  Total time spent 45 minutes. Over half that time spent discussing above.    Arvilla Meresaniel Shyane Fossum, MD  5:33 PM

## 2017-07-29 LAB — PROTIME-INR: INR: 1.6 — AB (ref <=1.1)

## 2017-07-30 ENCOUNTER — Ambulatory Visit (HOSPITAL_COMMUNITY): Payer: Self-pay | Admitting: Pharmacist

## 2017-07-30 DIAGNOSIS — Z95811 Presence of heart assist device: Secondary | ICD-10-CM

## 2017-08-06 ENCOUNTER — Other Ambulatory Visit: Payer: Self-pay | Admitting: Unknown Physician Specialty

## 2017-08-06 DIAGNOSIS — Z7901 Long term (current) use of anticoagulants: Secondary | ICD-10-CM

## 2017-08-06 DIAGNOSIS — Z95811 Presence of heart assist device: Secondary | ICD-10-CM

## 2017-08-08 ENCOUNTER — Encounter (HOSPITAL_COMMUNITY): Payer: Self-pay

## 2017-08-08 ENCOUNTER — Inpatient Hospital Stay (HOSPITAL_COMMUNITY)
Admission: AD | Admit: 2017-08-08 | Discharge: 2017-08-09 | DRG: 315 | Disposition: A | Discharge status: Home / Self Care | Payer: Medicaid Other | Source: Ambulatory Visit | Attending: Cardiology | Admitting: Cardiology

## 2017-08-08 ENCOUNTER — Ambulatory Visit (HOSPITAL_COMMUNITY)
Admission: RE | Admit: 2017-08-08 | Discharge: 2017-08-08 | Disposition: A | Discharge status: Home / Self Care | Payer: Medicaid Other | Source: Ambulatory Visit | Attending: Cardiology | Admitting: Cardiology

## 2017-08-08 ENCOUNTER — Ambulatory Visit (HOSPITAL_COMMUNITY): Payer: Self-pay | Admitting: Pharmacist

## 2017-08-08 ENCOUNTER — Ambulatory Visit (HOSPITAL_COMMUNITY)
Admission: RE | Admit: 2017-08-08 | Discharge: 2017-08-08 | Disposition: A | Discharge status: Home / Self Care | Payer: Medicaid Other | Source: Ambulatory Visit | Attending: Internal Medicine | Admitting: Internal Medicine

## 2017-08-08 VITALS — BP 118/0 | HR 79 | Ht 71.0 in | Wt 204.8 lb

## 2017-08-08 DIAGNOSIS — Z95811 Presence of heart assist device: Secondary | ICD-10-CM

## 2017-08-08 DIAGNOSIS — T829XXA Unspecified complication of cardiac and vascular prosthetic device, implant and graft, initial encounter: Secondary | ICD-10-CM | POA: Diagnosis not present

## 2017-08-08 DIAGNOSIS — I5022 Chronic systolic (congestive) heart failure: Secondary | ICD-10-CM | POA: Diagnosis present

## 2017-08-08 DIAGNOSIS — Z86718 Personal history of other venous thrombosis and embolism: Secondary | ICD-10-CM

## 2017-08-08 DIAGNOSIS — Y848 Other medical procedures as the cause of abnormal reaction of the patient, or of later complication, without mention of misadventure at the time of the procedure: Secondary | ICD-10-CM | POA: Diagnosis present

## 2017-08-08 DIAGNOSIS — Z7901 Long term (current) use of anticoagulants: Secondary | ICD-10-CM

## 2017-08-08 DIAGNOSIS — I110 Hypertensive heart disease with heart failure: Secondary | ICD-10-CM | POA: Diagnosis present

## 2017-08-08 DIAGNOSIS — Y711 Therapeutic (nonsurgical) and rehabilitative cardiovascular devices associated with adverse incidents: Secondary | ICD-10-CM | POA: Diagnosis present

## 2017-08-08 DIAGNOSIS — Z87891 Personal history of nicotine dependence: Secondary | ICD-10-CM

## 2017-08-08 DIAGNOSIS — T829XXD Unspecified complication of cardiac and vascular prosthetic device, implant and graft, subsequent encounter: Secondary | ICD-10-CM | POA: Diagnosis not present

## 2017-08-08 DIAGNOSIS — I429 Cardiomyopathy, unspecified: Secondary | ICD-10-CM | POA: Diagnosis present

## 2017-08-08 DIAGNOSIS — Z79899 Other long term (current) drug therapy: Secondary | ICD-10-CM

## 2017-08-08 DIAGNOSIS — T82118A Breakdown (mechanical) of other cardiac electronic device, initial encounter: Secondary | ICD-10-CM | POA: Diagnosis present

## 2017-08-08 LAB — PROTIME-INR
INR: 2.13
INR: 2.3
Prothrombin Time: 23.7 seconds — ABNORMAL HIGH (ref 11.4–15.2)
Prothrombin Time: 25.1 seconds — ABNORMAL HIGH (ref 11.4–15.2)

## 2017-08-08 LAB — CBC
HCT: 38 % — ABNORMAL LOW (ref 39.0–52.0)
Hemoglobin: 12.4 g/dL — ABNORMAL LOW (ref 13.0–17.0)
MCH: 33.4 pg (ref 26.0–34.0)
MCHC: 32.6 g/dL (ref 30.0–36.0)
MCV: 102.4 fL — AB (ref 78.0–100.0)
Platelets: 254 10*3/uL (ref 150–400)
RBC: 3.71 MIL/uL — ABNORMAL LOW (ref 4.22–5.81)
RDW: 13 % (ref 11.5–15.5)
WBC: 3.8 10*3/uL — AB (ref 4.0–10.5)

## 2017-08-08 LAB — BASIC METABOLIC PANEL
ANION GAP: 8 (ref 5–15)
BUN: 8 mg/dL (ref 6–20)
CALCIUM: 8.7 mg/dL — AB (ref 8.9–10.3)
CO2: 23 mmol/L (ref 22–32)
Chloride: 105 mmol/L (ref 101–111)
Creatinine, Ser: 1.24 mg/dL (ref 0.61–1.24)
GFR calc Af Amer: >60 mL/min (ref >60)
GFR calc non Af Amer: >60 mL/min (ref >60)
GLUCOSE: 95 mg/dL (ref 65–99)
POTASSIUM: 4.1 mmol/L (ref 3.5–5.1)
Sodium: 136 mmol/L (ref 135–145)

## 2017-08-08 LAB — LACTATE DEHYDROGENASE
LDH: 244 U/L — ABNORMAL HIGH (ref 98–192)
LDH: 247 U/L — ABNORMAL HIGH (ref 98–192)

## 2017-08-08 LAB — MRSA PCR SCREENING: MRSA by PCR: NEGATIVE

## 2017-08-08 MED ORDER — ONDANSETRON HCL 4 MG/2ML IJ SOLN
4.0000 mg | Freq: Four times a day (QID) | INTRAMUSCULAR | Status: DC | PRN
Start: 2017-08-08 — End: 2017-08-09

## 2017-08-08 MED ORDER — ACETAMINOPHEN 325 MG PO TABS: 650.0000 mg | ORAL_TABLET | ORAL | Status: DC | PRN

## 2017-08-08 MED ORDER — CHLORHEXIDINE GLUCONATE CLOTH 2 % EX PADS
6.0000 | MEDICATED_PAD | Freq: Every day | CUTANEOUS | Status: DC
  Administered 2017-08-08 – 2017-08-09 (×2): 6 via TOPICAL

## 2017-08-08 MED ORDER — SACUBITRIL-VALSARTAN 49-51 MG PO TABS
1.0000 | ORAL_TABLET | Freq: Two times a day (BID) | ORAL | Status: DC
  Administered 2017-08-08 – 2017-08-09 (×2): 1 via ORAL
  Filled 2017-08-08 (×3): qty 1

## 2017-08-08 MED ORDER — SODIUM CHLORIDE 0.9% FLUSH
10.0000 mL | Freq: Two times a day (BID) | INTRAVENOUS | Status: DC
  Administered 2017-08-08: 10 mL

## 2017-08-08 MED ORDER — SODIUM CHLORIDE 0.9% FLUSH: 10.0000 mL | INTRAVENOUS | Status: DC | PRN

## 2017-08-08 MED ORDER — ACETAMINOPHEN 500 MG PO TABS: 500.0000 mg | ORAL_TABLET | Freq: Every day | ORAL | Status: DC | PRN

## 2017-08-08 MED ORDER — MAGNESIUM HYDROXIDE 400 MG/5ML PO SUSP: 15.0000 mL | Freq: Every day | ORAL | Status: DC | PRN

## 2017-08-08 MED ORDER — WARFARIN SODIUM 7.5 MG PO TABS
7.5000 mg | ORAL_TABLET | ORAL | Status: DC
  Administered 2017-08-09: 7.5 mg via ORAL
  Filled 2017-08-08: qty 1

## 2017-08-08 MED ORDER — WARFARIN - PHARMACIST DOSING INPATIENT
Freq: Every day | Status: DC
  Administered 2017-08-08: 1

## 2017-08-08 MED ORDER — WARFARIN SODIUM 5 MG PO TABS: 5.0000 mg | ORAL_TABLET | ORAL | Status: DC

## 2017-08-08 MED ORDER — DEXTROSE-NACL 5-0.45 % IV SOLN: INTRAVENOUS | Status: DC

## 2017-08-08 NOTE — Progress Notes (Signed)
Patient presents for 2 mo f/u. As soon as patient switched to white lead onto the PM in clinic he had a red alarm and pump was off. Pt was immediately switched back to batteries and the alarm stopped.   Pt has not rescheduled appt at [facility] but states that he wants to but hasn't had time. Pt states he hasn't taken any Lasix and has been taking his Entresto twice daily at the increased dose of 49-51.   Vital Signs:  Doppler Pressure 118 Automatc BP:  115/95 (103) HR:  88 SPO2: 99% RA  Weight: 204.8 lb w/o eqt Last weight: 205.9 lb Home weights: Does not wiegh   VAD Indication: DT-eval ongoing at [facility] - requested repeat CT chest in Aug due to "spot on lung" 01/21/17 - info sent to [facility]. Pt cancelled appointment with Dr. Aundria Rud on 05/27/17 and 06/10/17, has not rescheduled.  VAD interrogation & Equipment Management (reviewed with Dr. Shirlee Latch): Speed:9400 Flow: 4.8 Power:5.3 w    PI:  5.6  Alarms: few low voltage advisories Events: rare  Fixed speed 9400 Low speed limit: 8800  Primary Controller:  Replace back up battery in 31 months Back up controller:   Replace back up battery in 24 months.  Annual Equipment Maintenance on UBC/PM due 5/19.  I reviewed the LVAD parameters from today and compared the results to the patient's prior recorded data. LVAD interrogation was NEGATIVE for significant power changes, POSITIVE for clinical alarms with over 100+ drive line faults and STABLE for PI events/speed drops. Pt had 3 pump off alarms that are visible but only can see back to 1/19. Pt was placed on an ungrounded cable, this resolved the alarm. Pt is performing daily controller and system monitor self tests along with completing weekly and monthly maintenance for LVAD equipment.  LVAD equipment check completed and is in good working order. Back-up equipment present. Charged back up battery and performed self-test on equipment.   Exit Site Care: Drive line is being maintained weekly   by patient. Drive line exit site well healed and incorporated. The velour is fully implanted at exit site. Dressing dry and intact. No erythema or drainage. Stabilization device present and accurately applied. Pt denies fever or chills. Pt provided with 8 weekly dressing kits.   Significant Events on VAD Support:  none  Device:none  BP & Labs:  Doppler BP 118 - Doppler is reflecting modified systolic.  Hgb  12.4 - No S/S of bleeding. Specifically denies melena/BRBPR or nosebleeds.  LDH 244 - established baseline of 280- 320. Denies tea-colored urine. No power elevations noted on interrogation.   Patient Instructions: 1. Admit pt for drive line fracture to 9Y58.  2. Will schedule f/u upon discharge from the hospital. 3. Re-schedule appointment with Dr. Aundria Rud at [facility] for transplant evaluation.     Carlton Adam RN VAD Coordinator   Office: [phone] 24/7 Emergency VAD Pager: [phone]

## 2017-08-08 NOTE — Progress Notes (Signed)
Peripherally Inserted Central Catheter/Midline Placement  The IV Nurse has discussed with the patient and/or persons authorized to consent for the patient, the purpose of this procedure and the potential benefits and risks involved with this procedure.  The benefits include less needle sticks, lab draws from the catheter, and the patient may be discharged home with the catheter. Risks include, but not limited to, infection, bleeding, blood clot (thrombus formation), and puncture of an artery; nerve damage and irregular heartbeat and possibility to perform a PICC exchange if needed/ordered by physician.  Alternatives to this procedure were also discussed.  Bard Power PICC patient education guide, fact sheet on infection prevention and patient information card has been provided to patient /or left at bedside.    PICC/Midline Placement Documentation        Timmothy Sours 08/08/2017, 4:03 PM

## 2017-08-08 NOTE — Progress Notes (Signed)
ANTICOAGULATION CONSULT NOTE  Pharmacy Consult for Coumadin Indication: LVAD  No Known Allergies  Patient Measurements:   Heparin Dosing Weight: n/a  Vital Signs: BP: 118/0 (01/24 1009) Pulse Rate: 79 (01/24 1007)  Labs: Recent Labs    08/08/17 1010  HGB 12.4*  HCT 38.0*  PLT 254  LABPROT 23.7*  INR 2.13  CREATININE 1.24    Estimated Creatinine Clearance: 73.7 mL/min (by C-G formula based on SCr of 1.24 mg/dL).   Medical History: Past Medical History:  Diagnosis Date  . CHF (congestive heart failure) (HCC)   . Hypertension   . Left ventricular thrombus      Assessment: 62 yo male on chronic Coumadin for LVAD.  INR checked at clinic this AM, currently at goal. No bleeding or complications noted.  PTA Coumadin dose = 5 mg daily except 7.5 mg on Mon, Fridays.  Spoke to patient, he took 5 mg dose this AM (1/24) prior to admission.  Goal of Therapy:  INR goal 2-2.5 Monitor platelets by anticoagulation protocol: Yes   Plan:  1. Continue Coumadin at home dose of 5 mg daily except 7.5 mg on Mondays and Fridays. 2. Will schedule doses for 10 AM - since patient reports he usually takes in the morning at home, and prefers to keep this schedule if possible.  Tad Moore, BCPS  Clinical Pharmacist Pager [phone]  08/08/2017 12:42 PM

## 2017-08-08 NOTE — H&P (Signed)
Advanced Heart Failure VAD History and Physical Note   Reason for Admission: VAD drive line fault   HPI:    [name] is a 62 yo with history of nonischemic cardiomyopathy/chronic systolic CHF (EF 8% by MRI) and LV thrombus now s/p LVAD placement on 11/21/15 presenting today follow up.   Says he has been having pump stops since 07/22/2017. He said once the alarm started he switched to batteries. Denies dizzinees/syncope.  Overall feeling fine. Denies SOB/PND/Orthopnea. Appetite ok. No fever or chills. Weight at home has been stable. No BRBPR. Taking all medications.   Plain films done today show external driveline fracture.   INR 2.13, LDH 244.   LVAD INTERROGATION:  HeartMate II LVAD:  Flow 5.4  liters/min, speed 9400, power 5.8 , PI 6.1  With 3 pump stops since 1/16 and multiple driveline faults.      Review of Systems: [y] = yes, [ ]  = no   General: Weight gain [ ] ; Weight loss [ ] ; Anorexia [ ] ; Fatigue [ ] ; Fever [ ] ; Chills [ ] ; Weakness [ ]   Cardiac: Chest pain/pressure [ ] ; Resting SOB [ ] ; Exertional SOB [ ] ; Orthopnea [ ] ; Pedal Edema [ ] ; Palpitations [ ] ; Syncope [ ] ; Presyncope [ ] ; Paroxysmal nocturnal dyspnea[ ]   Pulmonary: Cough [ ] ; Wheezing[ ] ; Hemoptysis[ ] ; Sputum [ ] ; Snoring [ ]   GI: Vomiting[ ] ; Dysphagia[ ] ; Melena[ ] ; Hematochezia [ ] ; Heartburn[ ] ; Abdominal pain [ ] ; Constipation [ ] ; Diarrhea [ ] ; BRBPR [ ]   GU: Hematuria[ ] ; Dysuria [ ] ; Nocturia[ ]   Vascular: Pain in legs with walking [ ] ; Pain in feet with lying flat [ ] ; Non-healing sores [ ] ; Stroke [ ] ; TIA [ ] ; Slurred speech [ ] ;  Neuro: Headaches[ ] ; Vertigo[ ] ; Seizures[ ] ; Paresthesias[ ] ;Blurred vision [ ] ; Diplopia [ ] ; Vision changes [ ]   Ortho/Skin: Arthritis [ ] ; Joint pain [ Y]; Muscle pain [ ] ; Joint swelling [ ] ; Back Pain [ ] ; Rash [ ]   Psych: Depression[ ] ; Anxiety[ ]   Heme: Bleeding problems [ ] ; Clotting disorders [ ] ; Anemia [ ]   Endocrine: Diabetes [ ] ; Thyroid dysfunction[ ]      Home Medications Prior to Admission medications   Medication Sig Start Date End Date Taking? Authorizing Provider  acetaminophen (TYLENOL) 500 MG tablet Take 500 mg by mouth daily as needed for moderate pain or headache.    [provider]  Blood Pressure Monitoring (BLOOD PRESSURE CUFF) MISC 1 Units by Does not apply route daily. 02/14/16   Laurey Morale, MD  furosemide (LASIX) 20 MG tablet Take 1 tablet (20 mg total) by mouth as needed. Patient not taking: Reported on 07/10/2017 06/11/17   Laurey Morale, MD  magnesium hydroxide (MILK OF MAGNESIA) 400 MG/5ML suspension Take 15 mLs by mouth daily as needed for mild constipation.    [provider]  potassium chloride (K-DUR) 10 MEQ tablet Take 1 tablet (10 mEq total) by mouth daily. Take daily for 3 days then as needed with each dose of Lasix 02/26/17 05/27/17  Laurey Morale, MD  sacubitril-valsartan (ENTRESTO) 49-51 MG Take 1 tablet by mouth 2 (two) times daily. 07/10/17   Bensimhon, Bevelyn Buckles, MD  tadalafil (CIALIS) 5 MG tablet Take 1 tablet (5 mg total) by mouth daily. Patient not taking: Reported on 08/08/2017 05/17/17 05/17/18  Little Ishikawa, NP  warfarin (COUMADIN) 5 MG tablet Take  5 mg (1 tablet) daily except 7.5 mg (1 and  1/2 tablets) on Monday, Friday    [provider]    Past Medical History: Past Medical History:  Diagnosis Date  . CHF (congestive heart failure) (HCC)   . Hypertension   . Left ventricular thrombus     Past Surgical History: Past Surgical History:  Procedure Laterality Date  . CARDIAC CATHETERIZATION N/A 11/07/2015   Procedure: Left Heart Cath and Coronary Angiography;  Surgeon: Laurey Morale, MD;  Location: [facility] INVASIVE CV LAB;  Service: Cardiovascular;  Laterality: N/A;  . CARDIAC CATHETERIZATION N/A 11/18/2015   Procedure: Right Heart Cath;  Surgeon: Laurey Morale, MD;  Location: [facility] INVASIVE CV LAB;  Service: Cardiovascular;  Laterality: N/A;  . COLONOSCOPY N/A 11/17/2015    Procedure: COLONOSCOPY;  Surgeon: Napoleon Form, MD;  Location: [facility] ENDOSCOPY;  Service: Endoscopy;  Laterality: N/A;  . ESOPHAGOGASTRODUODENOSCOPY N/A 11/17/2015   Procedure: ESOPHAGOGASTRODUODENOSCOPY (EGD);  Surgeon: Napoleon Form, MD;  Location: [facility] ENDOSCOPY;  Service: Endoscopy;  Laterality: N/A;  . INSERTION OF IMPLANTABLE LEFT VENTRICULAR ASSIST DEVICE N/A 11/21/2015   Procedure: INSERTION OF IMPLANTABLE LEFT VENTRICULAR ASSIST DEVICE;  Surgeon: Alleen Borne, MD;  Location: [facility] OR;  Service: Open Heart Surgery;  Laterality: N/A;  CIRC ARREST  NITRIC OXIDE  . RIGHT HEART CATH N/A 10/08/2016   Procedure: Right Heart Cath;  Surgeon: Laurey Morale, MD;  Location: [facility] INVASIVE CV LAB;  Service: Cardiovascular;  Laterality: N/A;  . TEE WITHOUT CARDIOVERSION N/A 11/21/2015   Procedure: TRANSESOPHAGEAL ECHOCARDIOGRAM (TEE);  Surgeon: Alleen Borne, MD;  Location: [facility] OR;  Service: Open Heart Surgery;  Laterality: N/A;    Family History: No family history of coronary disease.   Social History: Social History   Socioeconomic History  . Marital status: Divorced    Spouse name: Not on file  . Number of children: Not on file  . Years of education: Not on file  . Highest education level: Not on file  Social Needs  . Financial resource strain: Not on file  . Food insecurity - worry: Not on file  . Food insecurity - inability: Not on file  . Transportation needs - medical: Not on file  . Transportation needs - non-medical: Not on file  Occupational History  . Not on file  Tobacco Use  . Smoking status: Former Games developer  . Smokeless tobacco: Never Used  Substance and Sexual Activity  . Alcohol use: No  . Drug use: No  . Sexual activity: Not on file  Other Topics Concern  . Not on file  Social History Narrative  . Not on file    Allergies:  No Known Allergies  Objective:    Vital Signs:   BP: ()/()  Arterial Line BP: ()/()    There were no vitals filed for this  visit.  Mean arterial Pressure 103   Physical Exam    General:  Well appearing. No resp difficulty HEENT: Normal Neck: supple. JVP 5-6 . Carotids 2+ bilat; no bruits. No lymphadenopathy or thyromegaly appreciated. Cor: Mechanical heart sounds with LVAD hum present. Lungs: Clear Abdomen: soft, nontender, nondistended. No hepatosplenomegaly. No bruits or masses. Good bowel sounds. Driveline: C/D/I; securement device intact and driveline incorporated Extremities: no cyanosis, clubbing, rash, edema Neuro: alert & orientedx3, cranial nerves grossly intact. moves all 4 extremities w/o difficulty. Affect pleasant   EKG   Pending  Labs    Basic Metabolic Panel: Recent Labs  Lab 08/08/17 1010  NA 136  K 4.1  CL 105  CO2  23  GLUCOSE 95  BUN 8  CREATININE 1.24  CALCIUM 8.7*    Liver Function Tests: No results for input(s): AST, ALT, ALKPHOS, BILITOT, PROT, ALBUMIN in the last 168 hours. No results for input(s): LIPASE, AMYLASE in the last 168 hours. No results for input(s): AMMONIA in the last 168 hours.  CBC: Recent Labs  Lab 08/08/17 1010  WBC 3.8*  HGB 12.4*  HCT 38.0*  MCV 102.4*  PLT 254    Cardiac Enzymes: No results for input(s): CKTOTAL, CKMB, CKMBINDEX, TROPONINI in the last 168 hours.  BNP: BNP (last 3 results) No results for input(s): BNP in the last 8760 hours.  ProBNP (last 3 results) No results for input(s): PROBNP in the last 8760 hours.   CBG: No results for input(s): GLUCAP in the last 168 hours.  Coagulation Studies: Recent Labs    08/08/17 1010  LABPROT 23.7*  INR 2.13    Other results: EKG:  Imaging    Dg Chest 2 View  Result Date: 08/08/2017 CLINICAL DATA:  LVAD EXAM: CHEST  2 VIEW COMPARISON:  12/29/2015 FINDINGS: LVAD is unchanged. Mild cardiomegaly. Prior median sternotomy. Lungs are clear. No effusions or pneumothorax. No acute bony abnormality. IMPRESSION: Cardiomegaly.  No acute findings. Electronically Signed   By:  Charlett Nose M.D.   On: 08/08/2017 10:59   Dg Abd 1 View  Result Date: 08/08/2017 CLINICAL DATA:  Pain.  Apparent lead drive fracture for heart pump EXAM: ABDOMEN - 1 VIEW COMPARISON:  None. FINDINGS: There is a disconnection along the lateral aspect of the left ventricular assist device. There is no bowel dilatation or air-fluid level to suggest bowel obstruction. No free air. IMPRESSION: Disconnection along the lateral aspect of the left ventricular assist device. No bowel obstruction or free air evident. Electronically Signed   By: Bretta Bang III M.D.   On: 08/08/2017 10:58       Patient Profile:  [name] is a 62 yo with history of nonischemic cardiomyopathy/chronic systolic CHF (EF 8% by MRI) and LV thrombus now s/p LVAD placement on 11/21/15 presenting today follow up.   Presented to VAD clinic today and has been having pump stops and drive line faults.    Assessment/Plan:    1. HMII VAD complication Pump stops noted on interrogation. Multiple driveline faults.  XRay concerning for external driveline fracture. [employer] engineers evaluated will plan for repair tomorrow.  Will need central line placed and epinephrine at the bedside.  CT surgery will be notified.  Continue coumadin. Check INR => 2.13.  Pharmacy to dose coumadin.   2. Chronic Systolic Heart Failure From HF perspective he is stable.  MAP elevated will continue current HF meds. May need to increase entresto.   Admit to ICU for driveline repair. Place on ungrounded cable.    I reviewed the LVAD parameters from today, and compared the results to the patient's prior recorded data.  No programming changes were made.  The LVAD is not  functioning within specified parameters.  The patient performs LVAD self-test daily.  LVAD interrogation was positive for pump stops and multiple driveline faults.  LVAD equipment check completed and is in good working order.  Back-up equipment present.   LVAD education done on emergency  procedures and precautions and reviewed exit site care.  Length of Stay: 0  [name], NP 08/08/2017, 11:48 AM  VAD Team Pager [phone] (7am - 7am) +++VAD ISSUES ONLY+++   Advanced Heart Failure Team Pager [phone] (M-F; 7a - 4p)  Please contact [facility] for night-coverage after hours (4p -7a ) and weekends on [email or web address] for all non- LVAD Issues  Patient seen with NP, agree with the above note he has had episodes of pump stops at home, 1st was on 1/7.  He had a pump stop in clinic.  Now on ungrounded cable.  Plain films show an external driveline fracture.    Otherwise, he has been doing very well, NYHA class I.  Has not been for [facility] evaluation at [facility] yet.  MAP elevated today around 100.   On exam, JVP not elevated, normal LVAD sounds, no obvious driveline damage, clear lungs.    We will admit him to CCU.  Will place PICC for central access.  He will be on ungrounded cable.  [employer] contracted, engineers to attempt driveline repair likely tomorrow.    Continue warfarin, INR therapeutic. LDH at baseline.   [name] 08/08/2017 12:42 PM

## 2017-08-08 NOTE — Progress Notes (Signed)
CSW referred to assist with financial assistance as patient has to be admitted and requesting to leave AMA due to unpaid hotel bill. Patient is currently residing in an extended stay hotel and his weekly rent is due tomorrow Friday. Patient wants to leave to return to [place] to pay the rent and return. In addition, patient reports he has to use "my connections" to borrow the monies to pay the rent. Dr. Shirlee Latch encouraged patient to remain as his medical situation is critical. CSW coordinated with patient to have his friend pick up his belongings from the extended stay and check out as unsure the extent of his hospitalization and can check back in once discharged from the hospital. CSW and patient spoke with hotel staff Delfino Lovett who agreed to hold patient's deposit of $150 for his return check in post hospitalization. CSW will assist patient with weekly rent post hospitalization. Patient agreed to remain and was admitted to [facility]. CSW will be available to patient post hospitalization as needed. Lasandra Beech, LCSW, CCSW-MCS [phone]

## 2017-08-09 DIAGNOSIS — T829XXD Unspecified complication of cardiac and vascular prosthetic device, implant and graft, subsequent encounter: Secondary | ICD-10-CM

## 2017-08-09 LAB — TYPE AND SCREEN
ABO/RH(D): A POS
Antibody Screen: NEGATIVE

## 2017-08-09 LAB — CBC
HEMATOCRIT: 36.2 % — AB (ref 39.0–52.0)
HEMOGLOBIN: 11.6 g/dL — AB (ref 13.0–17.0)
MCH: 32.9 pg (ref 26.0–34.0)
MCHC: 32 g/dL (ref 30.0–36.0)
MCV: 102.5 fL — ABNORMAL HIGH (ref 78.0–100.0)
Platelets: 231 10*3/uL (ref 150–400)
RBC: 3.53 MIL/uL — ABNORMAL LOW (ref 4.22–5.81)
RDW: 12.9 % (ref 11.5–15.5)
WBC: 3.4 10*3/uL — ABNORMAL LOW (ref 4.0–10.5)

## 2017-08-09 LAB — BASIC METABOLIC PANEL
Anion gap: 8 (ref 5–15)
BUN: 14 mg/dL (ref 6–20)
CALCIUM: 8.7 mg/dL — AB (ref 8.9–10.3)
CO2: 22 mmol/L (ref 22–32)
CREATININE: 1.2 mg/dL (ref 0.61–1.24)
Chloride: 105 mmol/L (ref 101–111)
GFR calc Af Amer: >60 mL/min (ref >60)
GFR calc non Af Amer: >60 mL/min (ref >60)
Glucose, Bld: 109 mg/dL — ABNORMAL HIGH (ref 65–99)
Potassium: 3.8 mmol/L (ref 3.5–5.1)
Sodium: 135 mmol/L (ref 135–145)

## 2017-08-09 LAB — LACTATE DEHYDROGENASE: LDH: 197 U/L — ABNORMAL HIGH (ref 98–192)

## 2017-08-09 LAB — PROTIME-INR
INR: 2.39
PROTHROMBIN TIME: 25.9 s — AB (ref 11.4–15.2)

## 2017-08-09 LAB — HIV ANTIBODY (ROUTINE TESTING W REFLEX): HIV SCREEN 4TH GENERATION: NONREACTIVE

## 2017-08-09 MED ORDER — EPINEPHRINE PF 1 MG/10ML IJ SOSY
PREFILLED_SYRINGE | INTRAMUSCULAR | Status: AC
  Filled 2017-08-09: qty 10

## 2017-08-09 MED ORDER — EPINEPHRINE PF 1 MG/ML IJ SOLN
0.5000 ug/min | INTRAVENOUS | Status: DC
  Filled 2017-08-09: qty 4

## 2017-08-09 NOTE — Progress Notes (Signed)
Advanced Heart Failure VAD Team Note  Subjective:   Admitted for driveline fracture. On ungrounded cable.   Denies SOB/CP  LVAD INTERROGATION:  HeartMate II LVAD:  Flow 4.6 liters/min, speed 9400 power 5.5 , PI 6.3 .   Objective:    Vital Signs:   Temp:  [97.8 F (36.6 C)-98.4 F (36.9 C)] 97.8 F (36.6 C) (01/25 0755) Pulse Rate:  [69-96] 70 (01/25 0600) Resp:  [9-26] 18 (01/25 0700) BP: (96)/(84) 96/84 (01/24 1200) SpO2:  [95 %-100 %] 97 % (01/25 0600) Weight:  [195 lb 12.3 oz (88.8 kg)-204 lb 12.9 oz (92.9 kg)] 195 lb 12.3 oz (88.8 kg) (01/25 0600)   Mean arterial Pressure 90-100s   Intake/Output:   Intake/Output Summary (Last 24 hours) at 08/09/2017 0819 Last data filed at 08/08/2017 2248 Gross per 24 hour  Intake 250 ml  Output -  Net 250 ml     Physical Exam    General:  Well appearing. No resp difficulty HEENT: normal Neck: supple. JVP 5-6  . Carotids 2+ bilat; no bruits. No lymphadenopathy or thyromegaly appreciated. Cor: Mechanical heart sounds with LVAD hum present. Lungs: clear Abdomen: soft, nontender, nondistended. No hepatosplenomegaly. No bruits or masses. Good bowel sounds. Driveline: C/D/I; securement device intact and driveline incorporated Extremities: no cyanosis, clubbing, rash, edema Neuro: alert & orientedx3, cranial nerves grossly intact. moves all 4 extremities w/o difficulty. Affect pleasant   Telemetry   NSR personaly reviewed.   EKG    N/A  Labs   Basic Metabolic Panel: Recent Labs  Lab 08/08/17 1010 08/09/17 0446  NA 136 135  K 4.1 3.8  CL 105 105  CO2 23 22  GLUCOSE 95 109*  BUN 8 14  CREATININE 1.24 1.20  CALCIUM 8.7* 8.7*    Liver Function Tests: No results for input(s): AST, ALT, ALKPHOS, BILITOT, PROT, ALBUMIN in the last 168 hours. No results for input(s): LIPASE, AMYLASE in the last 168 hours. No results for input(s): AMMONIA in the last 168 hours.  CBC: Recent Labs  Lab 08/08/17 1010 08/09/17 0446    WBC 3.8* 3.4*  HGB 12.4* 11.6*  HCT 38.0* 36.2*  MCV 102.4* 102.5*  PLT 254 231    INR: Recent Labs  Lab 08/08/17 1010 08/08/17 1336 08/09/17 0446  INR 2.13 2.30 2.39    Other results:  EKG:    Imaging   Dg Chest 2 View  Result Date: 08/08/2017 CLINICAL DATA:  LVAD EXAM: CHEST  2 VIEW COMPARISON:  12/29/2015 FINDINGS: LVAD is unchanged. Mild cardiomegaly. Prior median sternotomy. Lungs are clear. No effusions or pneumothorax. No acute bony abnormality. IMPRESSION: Cardiomegaly.  No acute findings. Electronically Signed   By: Charlett Nose M.D.   On: 08/08/2017 10:59   Dg Abd 1 View  Result Date: 08/08/2017 CLINICAL DATA:  Pain.  Apparent lead drive fracture for heart pump EXAM: ABDOMEN - 1 VIEW COMPARISON:  None. FINDINGS: There is a disconnection along the lateral aspect of the left ventricular assist device. There is no bowel dilatation or air-fluid level to suggest bowel obstruction. No free air. IMPRESSION: Disconnection along the lateral aspect of the left ventricular assist device. No bowel obstruction or free air evident. Electronically Signed   By: Bretta Bang III M.D.   On: 08/08/2017 10:58      Medications:     Scheduled Medications: . Chlorhexidine Gluconate Cloth  6 each Topical Daily  . sacubitril-valsartan  1 tablet Oral BID  . sodium chloride flush  10-40 mL Intracatheter  Q12H  . [START ON 08/10/2017] warfarin  5 mg Oral Q T,Th,S,Su   And  . warfarin  7.5 mg Oral Once per day on Mon Fri  . Warfarin - Pharmacist Dosing Inpatient   Does not apply q1800     Infusions: . dextrose 5 % and 0.45% NaCl       PRN Medications:  acetaminophen, magnesium hydroxide, ondansetron (ZOFRAN) IV, sodium chloride flush   Patient Profile  Matthew Vaughan is a 62 yo with history of nonischemic cardiomyopathy/chronic systolic CHF (EF 8% by MRI) and LV thrombus now s/p LVAD placement on 11/21/15 presenting today follow up.   Presented to VAD clinic today and has  been having pump stops and drive line faults.    Assessment/Plan:    1. HMII VAD complication Pump stops noted on interrogation. Multiple driveline faults.  XRay concerning for external driveline fracture. [employer] engineers evaluated will plan for repair today.  CT surgery will  Continue coumadin. INR 2.3   Pharmacy to dose coumadin.  PICC placed. Epi at bed side.   2. Chronic Systolic Heart Failure From HF perspective he is stable.  Maps high. Increase entresto 97-103 mg twice a day after driveline repair.   Plan for driveline repair today at 1000.    I reviewed the LVAD parameters from today, and compared the results to the patient's prior recorded data.  No programming changes were made.  The LVAD is functioning within specified parameters. He is on an [employer].  The patient performs LVAD self-test daily.  LVAD interrogation was negative for any significant power changes, alarms or PI events/speed drops.  LVAD equipment check completed and is in good working order.  Back-up equipment present.   LVAD education done on emergency procedures and precautions and reviewed exit site care.  Length of Stay: 1  Matthew Becket, NP 08/09/2017, 8:19 AM  VAD Team --- VAD ISSUES ONLY--- Pager [phone] (7am - 7am)  Advanced Heart Failure Team  Pager [phone] (M-F; 7a - 4p)  Please contact [facility] for night-coverage after hours (4p -7a ) and weekends on [email or web address]  Patient seen with NP, agree with the above note.   No further pump stops overnight.  Driveline successfully repaired at the bedside today by [employer].    MAP elevated, needs to increase Entresto to 97/103 bid.   Sinus tachycardia noted this afternoon, suspect dehydrated from NPO status.  Will have him eat and drink before discharge.   He can go home today, will arrange followup.   Matthew Vaughan 08/09/2017 1:43 PM

## 2017-08-09 NOTE — Progress Notes (Signed)
LVAD Coordinator Rounding Note:  Admitted 08/09/17 due to several pump stops and multiple "drive line fault" alarms. Pt currently on batteries or ungrounded cable only.  HM II LVAD implanted on 11/21/15 by Dr. Laneta Simmers under Destination Therapy criteria; pt was milrinone dependent.   Vital signs: Temp:  97.8 HR: 96 Doppler Pressure:  96 Automatic BP:  155/100 (107) O2 Sat: 97% on RA Wt: 195 lbs   LVAD interrogation reveals:  Speed: 9400 Flow:  4.8 Power:  6.0w PI:  5.6 Alarms: multiple drive line fault and several pump stop alarms Events:  Rare PI events  Fixed speed: 9400 Low speed limit: 8800   Drive Line: [place] dressing dry and intact; anchor intact and accurately applied. Weekly dressing changes; due to be changed 08/14/17.   Labs:  LDH trend: 247>197  INR trend:  2.30>2.29  Anticoagulation Plan: -INR Goal: 2.0 - 2.5 -ASA Dose: none (nosebleeds)  ICD Device: -none   Adverse Events on VAD: - Nosebleeds on ASA; ASA stopped - Fractured drive line and short to shield; lead re-build 08/09/17  Plan/Recommendations:  1. Contact VAD pager if any issues with VAD equipment or driveline/dressing.   Hessie Diener, RN VAD Coordinator [phone] Pager: [phone]

## 2017-08-09 NOTE — Discharge Summary (Signed)
Advanced Heart Failure Team  Discharge Summary   Patient ID: Matthew Vaughan MRN: [id number], DOB/AGE: [birth year] 62 y.o. Admit date: 08/08/2017 D/C date:     08/09/2017   Primary Discharge Diagnoses:  1. HMII VAD Complication Drive Line Fractures Red wire Repair 08/09/2017 2. Chronic Systolic Heart Failure  Hospital Course:  [name] is a 62 yo with history of nonischemic cardiomyopathy/chronic systolic CHF (EF 8% by MRI) and LV thrombus now s/p LVAD placement on 11/21/15 admitted with LVAD equipment complication.   Presented to VAD clinic had been having pump stops and multiple drive line faults. Xray performed with concern for external driveline fracture. He was placed on an ungrounded cable and admitted to ICU. PICC line was placed for central access.  [employer] contacted and performed drive line repair at the bedside with VAD coordinator at the bedside.  No further issues with LVAD parameters.   From HF perspective he was stable. INR followed and he will continue coumadin at his current dose. HF medications will continue at home dose.   He will follow up in the LVAD clinic in a few weeks. He is very comfortable with VAD equipment and will go to batteries should he have another pump stop or driveline fault alarm. He will contact the VAD team should this occur. PICC linte remove prior to discharge.   LVAD Interrogation HM II:   Speed:  9400    Flow:  5.4  PI: 6  Power:   5.5    Back-up speed:  8800     Discharge Weight: 195 pounds.  Discharge Vitals: Blood pressure (!) 155/100, pulse 96, temperature 97.8 F (36.6 C), temperature source Oral, resp. rate 18, height 5\' 11"  (1.803 m), weight 195 lb 12.3 oz (88.8 kg), SpO2 97 %.  Labs: Lab Results  Component Value Date   WBC 3.4 (L) 08/09/2017   HGB 11.6 (L) 08/09/2017   HCT 36.2 (L) 08/09/2017   MCV 102.5 (H) 08/09/2017   PLT 231 08/09/2017    Recent Labs  Lab 08/09/17 0446  NA 135  K 3.8  CL 105  CO2 22  BUN 14   CREATININE 1.20  CALCIUM 8.7*  GLUCOSE 109*   Lab Results  Component Value Date   CHOL 123 11/09/2015   HDL 47 11/09/2015   LDLCALC 59 11/09/2015   TRIG 85 11/09/2015   BNP (last 3 results) No results for input(s): BNP in the last 8760 hours.  ProBNP (last 3 results) No results for input(s): PROBNP in the last 8760 hours.   Diagnostic Studies/Procedures   Dg Chest 2 View  Result Date: 08/08/2017 CLINICAL DATA:  LVAD EXAM: CHEST  2 VIEW COMPARISON:  12/29/2015 FINDINGS: LVAD is unchanged. Mild cardiomegaly. Prior median sternotomy. Lungs are clear. No effusions or pneumothorax. No acute bony abnormality. IMPRESSION: Cardiomegaly.  No acute findings. Electronically Signed   By: Charlett Nose M.D.   On: 08/08/2017 10:59   Dg Abd 1 View  Result Date: 08/08/2017 CLINICAL DATA:  Pain.  Apparent lead drive fracture for heart pump EXAM: ABDOMEN - 1 VIEW COMPARISON:  None. FINDINGS: There is a disconnection along the lateral aspect of the left ventricular assist device. There is no bowel dilatation or air-fluid level to suggest bowel obstruction. No free air. IMPRESSION: Disconnection along the lateral aspect of the left ventricular assist device. No bowel obstruction or free air evident. Electronically Signed   By: Bretta Bang III M.D.   On: 08/08/2017 10:58    Discharge Medications  Allergies as of 08/09/2017   No Known Allergies     Medication List    STOP taking these medications   potassium chloride 10 MEQ tablet Commonly known as:  K-DUR     TAKE these medications   acetaminophen 500 MG tablet Commonly known as:  TYLENOL Take 1,000 mg by mouth daily as needed for moderate pain or headache.   Blood Pressure Cuff Misc 1 Units by Does not apply route daily.   magnesium hydroxide 400 MG/5ML suspension Commonly known as:  MILK OF MAGNESIA Take 15 mLs by mouth daily as needed for mild constipation.   oxyCODONE 15 MG immediate release tablet Commonly known as:   ROXICODONE Take 15 mg by mouth 2 (two) times daily.   sacubitril-valsartan 49-51 MG Commonly known as:  ENTRESTO Take 1 tablet by mouth 2 (two) times daily.   tadalafil 5 MG tablet Commonly known as:  CIALIS Take 1 tablet (5 mg total) by mouth daily.   tetrahydrozoline 0.05 % ophthalmic solution Place 1 drop into both eyes as needed (dry, red eyes).   warfarin 5 MG tablet Commonly known as:  COUMADIN Take as directed. If you are unsure how to take this medication, talk to your nurse or doctor. Original instructions:  Take  5 mg (1 tablet) daily except 7.5 mg (1 and 1/2 tablets) on Monday, Friday       Disposition   The patient will be discharged in stable condition to home. Discharge Instructions    (HEART FAILURE PATIENTS) Call MD:  Anytime you have any of the following symptoms: 1) 3 pound weight gain in 24 hours or 5 pounds in 1 week 2) shortness of breath, with or without a dry hacking cough 3) swelling in the hands, feet or stomach 4) if you have to sleep on extra pillows at night in order to breathe.   Complete by:  As directed    Diet - low sodium heart healthy   Complete by:  As directed    Heart Failure patients record your daily weight using the same scale at the same time of day   Complete by:  As directed    INR  Goal: 2 - 3   Complete by:  As directed    Goal:  2 - 3   Increase activity slowly   Complete by:  As directed    Page VAD Coordinator at [phone]  Notify for: any VAD alarms, sustained elevations of power >10 watts, sustained drop in Pulse Index <3   Complete by:  As directed    Notify for:   any VAD alarms sustained elevations of power >10 watts sustained drop in Pulse Index <3     Speed Settings:   Complete by:  As directed    Fixed 9400 RPM Low 8800 RPM     Follow-up Information    Matthew Morale, MD Follow up.   Specialty:  Cardiology Contact information: [address] [place]. [address] [place] [address] [phone]              Duration of Discharge Encounter: Greater than 35 minutes   Signed, Matthew Becket  NP-C  08/09/2017, 12:30 PM

## 2017-08-09 NOTE — Progress Notes (Signed)
Two Arboriculturist here to perform Nurse, adult, [name] and [name]. Pt verbalized understanding of procedure, has reviewed "Patient Instructions Replaced HeartMate II LVAD Percutaneous Lead" handout.   During lead repair, primary controller (SN [id number]) demonstrated "controller fault" alarm. Replaced with back up controller [id number].  New back up controller is [id number].  Lead repair completed. Placed patient back on grounded cable. No further drive line fault alarms or pump stops noted. Drive line dressing change performed with anchor replaced. Gave patient alternative anchors to use and also gave him concealed weapon t-shirt to try. He has been advised to carry his VAD controller in a different manner in order to reduce bending, wear, and tear on drive line. Pt verbalized agreement to do the same.

## 2017-08-15 ENCOUNTER — Encounter (HOSPITAL_COMMUNITY): Payer: Medicaid Other

## 2017-08-22 ENCOUNTER — Ambulatory Visit (HOSPITAL_COMMUNITY)
Admission: RE | Admit: 2017-08-22 | Discharge: 2017-08-22 | Disposition: A | Discharge status: Home / Self Care | Payer: Medicaid Other | Source: Ambulatory Visit | Attending: Internal Medicine | Admitting: Internal Medicine

## 2017-08-22 ENCOUNTER — Other Ambulatory Visit (HOSPITAL_COMMUNITY): Payer: Self-pay | Admitting: *Deleted

## 2017-08-22 ENCOUNTER — Ambulatory Visit (HOSPITAL_COMMUNITY): Payer: Self-pay | Admitting: Pharmacist

## 2017-08-22 DIAGNOSIS — Z79899 Other long term (current) drug therapy: Secondary | ICD-10-CM | POA: Diagnosis not present

## 2017-08-22 DIAGNOSIS — Z95811 Presence of heart assist device: Secondary | ICD-10-CM

## 2017-08-22 DIAGNOSIS — Z4502 Encounter for adjustment and management of automatic implantable cardiac defibrillator: Secondary | ICD-10-CM | POA: Diagnosis not present

## 2017-08-22 DIAGNOSIS — I429 Cardiomyopathy, unspecified: Secondary | ICD-10-CM | POA: Diagnosis not present

## 2017-08-22 DIAGNOSIS — I110 Hypertensive heart disease with heart failure: Secondary | ICD-10-CM | POA: Insufficient documentation

## 2017-08-22 DIAGNOSIS — I5043 Acute on chronic combined systolic (congestive) and diastolic (congestive) heart failure: Secondary | ICD-10-CM | POA: Diagnosis not present

## 2017-08-22 DIAGNOSIS — Z7901 Long term (current) use of anticoagulants: Secondary | ICD-10-CM | POA: Diagnosis not present

## 2017-08-22 DIAGNOSIS — I5022 Chronic systolic (congestive) heart failure: Secondary | ICD-10-CM | POA: Insufficient documentation

## 2017-08-22 LAB — CBC
HCT: 38.2 % — ABNORMAL LOW (ref 39.0–52.0)
Hemoglobin: 12.3 g/dL — ABNORMAL LOW (ref 13.0–17.0)
MCH: 33.2 pg (ref 26.0–34.0)
MCHC: 32.2 g/dL (ref 30.0–36.0)
MCV: 103 fL — ABNORMAL HIGH (ref 78.0–100.0)
PLATELETS: 245 10*3/uL (ref 150–400)
RBC: 3.71 MIL/uL — ABNORMAL LOW (ref 4.22–5.81)
RDW: 13.1 % (ref 11.5–15.5)
WBC: 3.5 10*3/uL — ABNORMAL LOW (ref 4.0–10.5)

## 2017-08-22 LAB — PROTIME-INR
INR: 2.39
PROTHROMBIN TIME: 25.9 s — AB (ref 11.4–15.2)

## 2017-08-22 LAB — BASIC METABOLIC PANEL
Anion gap: 9 (ref 5–15)
BUN: 8 mg/dL (ref 6–20)
CALCIUM: 8.9 mg/dL (ref 8.9–10.3)
CO2: 21 mmol/L — ABNORMAL LOW (ref 22–32)
CREATININE: 1.21 mg/dL (ref 0.61–1.24)
Chloride: 105 mmol/L (ref 101–111)
Glucose, Bld: 96 mg/dL (ref 65–99)
Potassium: 4 mmol/L (ref 3.5–5.1)
SODIUM: 135 mmol/L (ref 135–145)

## 2017-08-22 LAB — LACTATE DEHYDROGENASE: LDH: 216 U/L — AB (ref 98–192)

## 2017-08-22 NOTE — Progress Notes (Addendum)
Patient presents for hospital d/c f/u. Pt denies any VAD alarms since discharge after lead re-biuld.    Pt has not rescheduled appt at [facility].  Vital Signs:  Doppler Pressure  120 Automatc BP:  100/69 (79) HR:  87 SPO2: did not pick up  Weight: 202 lb w/o eqt Last weight: 204.8 lb Home weights: Does not wiegh   VAD Indication: DT-eval at [facility] - requested repeat CT chest in Aug due to "spot on lung" 01/21/17 - info sent to [facility]. Pt cancelled appointment with Dr. Aundria Rud on 05/27/17 and 06/10/17, has not rescheduled.  VAD interrogation & Equipment Management (reviewed with Dr. Shirlee Latch): Speed:9400 Flow: 4.5 Power:  5.4 w    PI:  6.0  Alarms: few low voltage advisories Events: rare  Fixed speed 9400 Low speed limit: 8800  Primary Controller:  Replace back up battery in 23 months Back up controller:   Replace back up battery in 31 months.  Annual Equipment Maintenance on UBC/PM due 5/19.  I reviewed the LVAD parameters from today and compared the results to the patient's prior recorded data. LVAD interrogation was NEGATIVE for significant power changes, negative for clinical alarms and STABLE for PI events/speed drops. Pt is performing daily controller and system monitor self tests along with completing weekly and monthly maintenance for LVAD equipment.  LVAD equipment check completed and is in good working order. Back-up equipment present.   Exit Site Care: Drive line is being maintained weekly  by patient. Drive line exit site well healed and incorporated. The velour is fully implanted at exit site. Dressing dry and intact. No erythema or drainage. Stabilization device present and accurately applied. Pt denies fever or chills. Pt provided with 8 weekly dressing kits and 5 extra anchors   Significant Events on VAD Support: 08/08/17 - drive line fracture with lead re-build  Device:none  BP & Labs:  MAP 79  Hgb  - pending. Denies S/S of bleeding. Specifically denies  melena/BRBPR or nosebleeds.  LDH pending - established baseline of 280- 320. Denies tea-colored urine. No power elevations noted on interrogation.   Patient Instructions: 1. No change in meds. 2. Return to VAD clinic in two months.    Matthew Vaughan VAD Coordinator   Office: [phone] 24/7 Emergency VAD Pager: [phone]      LVAD CLINIC NOTE Cardiology: Dr. Shirlee Latch  Matthew Vaughan is a 62 yo with history of nonischemic cardiomyopathy/chronic systolic CHF (EF 8% by MRI) and LV thrombus now s/p LVAD placement on 11/21/15 presents for followup of chronic systolic CHF/LVAD.      He is no longer taking ASA 81.  While on ASA, he was having 2-3 major nosebleeds a week.  None off ASA.  He has tried a couple of times to restart with no success.  He remains on warfarin with no BRBPR or melena.   He was admitted in 1/19 with driveline fracture.  This was able to be repaired.  No alarms since that time.    He is doing well today. No exertional dyspnea.  No lightheadedness.  MAP controlled now that he is taking his meds. He is trying to take better care of his driveline.   LVAD interrogation  See LVAD nurse's note from today.   Labs (5/17): creatinine 1.05 => 0.91, HCT 30, LDH 235 => 294 Labs (6/17): K 3.7, creatinine 1.17, LDL 262 => 281, HCT 36.3, INR 2.6 Labs (8/17): hgb 11.2, LDH 243, creatinine 1.09 Labs (10/17): K 4.1, creatinine 1.11, hgb 12.4, LDH  262 Labs (12/17): LDH 284, K 4.2, creatinine 1.14, hgb 11.5 Labs (9/18): hgb 11.9 Labs (1/19): K 3.8, creatinine 1.2, hgb 11.6  PMH: 1. Chronic systolic CHF: Nonischemic cardiomyopathy.  Cause uncertain => not a heavy drinker, had one sister with what sounds like sudden death. Possible viral myocarditis.   - LHC/RHC (4/27): No angiographic CAD.  Mean RA 22, PA 57/34, mean PCWP 35, CI 1.39, PVR 2.6 WU. - Echo (4/17): Severely dilated LV with EF 10-15%.  - Cardiac MRI (4/17): EF 8%, severely dilated LV, LV thrombus, mildly dilated RV with  moderately decreased systolic function, patchy inferior LGE pattern could be consistent with prior myocarditis.  - Heartmate II LVAD placed 11/21/15.  - Echo 5/18 with EF 10-15% 2. LV thrombus.  3. HTN 4. Epistaxis  FH: No history of premature CAD or cardiomyopathy.   SH: Divorced, lives part-time in [place] and part-time in [place], worked as Art gallery manager.  Nonsmoker.  Rare ETOH.   ROS: All systems reviewed and negative except as per HPI.   Current Outpatient Medications  Medication Sig Dispense Refill  . acetaminophen (TYLENOL) 500 MG tablet Take 1,000 mg by mouth daily as needed for moderate pain or headache.     . Blood Pressure Monitoring (BLOOD PRESSURE CUFF) MISC 1 Units by Does not apply route daily. 1 each 0  . magnesium hydroxide (MILK OF MAGNESIA) 400 MG/5ML suspension Take 15 mLs by mouth daily as needed for mild constipation.    Marland Kitchen oxyCODONE (ROXICODONE) 15 MG immediate release tablet Take 15 mg by mouth 2 (two) times daily.  0  . sacubitril-valsartan (ENTRESTO) 49-51 MG Take 1 tablet by mouth 2 (two) times daily. 60 tablet 11  . tadalafil (CIALIS) 5 MG tablet Take 1 tablet (5 mg total) by mouth daily. 30 tablet 1  . tetrahydrozoline 0.05 % ophthalmic solution Place 1 drop into both eyes as needed (dry, red eyes).    . warfarin (COUMADIN) 5 MG tablet Take  5 mg (1 tablet) daily except 7.5 mg (1 and 1/2 tablets) on Monday, Friday     No current facility-administered medications for this encounter.    BP (!) 120/0 Comment: Doppler modified systolic  Pulse 87   Ht 5\' 11"  (1.803 m)   Wt 202 lb (91.6 kg)   BMI 28.17 kg/m   Vital signs: MAP 79 GENERAL: Well appearing this am. NAD.  HEENT: Normal. NECK: Supple, JVP 7-8 cm. Carotids OK.  CARDIAC:  Mechanical heart sounds with LVAD hum present.  LUNGS:  CTAB, normal effort.  ABDOMEN:  NT, ND, no HSM. No bruits or masses. +BS  LVAD exit site: Well-healed and incorporated. Dressing dry and intact. No erythema or drainage.  Stabilization device present and accurately applied. Driveline dressing changed daily per sterile technique. EXTREMITIES:  Warm and dry. No cyanosis, clubbing, rash, or edema.  NEUROLOGIC:  Alert & oriented x 3. Cranial nerves grossly intact. Moves all 4 extremities w/o difficulty. Affect pleasant    Assessment/Plan:  1. HeartMate II LVAD: Patient is doing well symptomatically, NYHA class I-II.  Recently admitted for driveline repair (1/19).  On exam today, he is not volume overloaded. MAP is controlled. - Continue Entresto 49/51 bid.       - Continue warfarin with INR goal 2-2.5.  LDH has been stable. He is off ASA due to intractable nose bleeds when taking ASA.  - He is interested in transplant evaluation.  I have referred him to [facility] for evaluation, waiting for appt. 2. Chronic systolic  CHF: Much improved since LVAD placement, now NYHA class I-II.  He is exercising regularly.  3. LV thrombus: Noted on pre-op cMRI.  Continue warfarin.                                                                                                                                                                                                 [name] 08/23/2017

## 2017-08-23 NOTE — Addendum Note (Signed)
Encounter addended by: Laurey Morale, MD on: 08/23/2017 12:30 AM  Actions taken: LOS modified

## 2017-09-02 ENCOUNTER — Encounter (HOSPITAL_COMMUNITY): Payer: Self-pay

## 2017-09-13 LAB — PROTIME-INR: INR: 2.9 — AB (ref <=1.1)

## 2017-09-18 ENCOUNTER — Ambulatory Visit (HOSPITAL_COMMUNITY): Payer: Self-pay | Admitting: Pharmacist

## 2017-09-18 DIAGNOSIS — Z95811 Presence of heart assist device: Secondary | ICD-10-CM

## 2017-10-11 ENCOUNTER — Other Ambulatory Visit (HOSPITAL_COMMUNITY): Payer: Self-pay | Admitting: *Deleted

## 2017-10-11 DIAGNOSIS — N522 Drug-induced erectile dysfunction: Secondary | ICD-10-CM

## 2017-10-11 MED ORDER — TADALAFIL 5 MG PO TABS: 5.0000 mg | ORAL_TABLET | Freq: Every day | ORAL | 2 refills | Status: AC

## 2017-10-18 ENCOUNTER — Other Ambulatory Visit (HOSPITAL_COMMUNITY): Payer: Self-pay | Admitting: Unknown Physician Specialty

## 2017-10-18 DIAGNOSIS — Z7901 Long term (current) use of anticoagulants: Secondary | ICD-10-CM

## 2017-10-18 DIAGNOSIS — Z95811 Presence of heart assist device: Secondary | ICD-10-CM

## 2017-10-21 ENCOUNTER — Encounter (HOSPITAL_COMMUNITY): Payer: Self-pay | Admitting: Unknown Physician Specialty

## 2017-10-21 ENCOUNTER — Ambulatory Visit (HOSPITAL_COMMUNITY): Payer: Self-pay | Admitting: Pharmacist

## 2017-10-21 ENCOUNTER — Encounter (HOSPITAL_COMMUNITY): Payer: Self-pay

## 2017-10-21 ENCOUNTER — Ambulatory Visit (HOSPITAL_COMMUNITY)
Admission: RE | Admit: 2017-10-21 | Discharge: 2017-10-21 | Disposition: A | Discharge status: Home / Self Care | Payer: Medicaid Other | Source: Ambulatory Visit | Attending: Cardiology | Admitting: Cardiology

## 2017-10-21 VITALS — BP 158/91 | HR 90 | Ht 71.0 in | Wt 204.0 lb

## 2017-10-21 DIAGNOSIS — I429 Cardiomyopathy, unspecified: Secondary | ICD-10-CM | POA: Insufficient documentation

## 2017-10-21 DIAGNOSIS — I110 Hypertensive heart disease with heart failure: Secondary | ICD-10-CM | POA: Diagnosis not present

## 2017-10-21 DIAGNOSIS — I5022 Chronic systolic (congestive) heart failure: Secondary | ICD-10-CM | POA: Diagnosis not present

## 2017-10-21 DIAGNOSIS — Z95811 Presence of heart assist device: Secondary | ICD-10-CM

## 2017-10-21 DIAGNOSIS — Z7901 Long term (current) use of anticoagulants: Secondary | ICD-10-CM | POA: Insufficient documentation

## 2017-10-21 DIAGNOSIS — Z79891 Long term (current) use of opiate analgesic: Secondary | ICD-10-CM | POA: Insufficient documentation

## 2017-10-21 DIAGNOSIS — Z79899 Other long term (current) drug therapy: Secondary | ICD-10-CM | POA: Insufficient documentation

## 2017-10-21 LAB — BASIC METABOLIC PANEL
ANION GAP: 10 (ref 5–15)
BUN: 8 mg/dL (ref 6–20)
CALCIUM: 9 mg/dL (ref 8.9–10.3)
CHLORIDE: 104 mmol/L (ref 101–111)
CO2: 24 mmol/L (ref 22–32)
CREATININE: 1.28 mg/dL — AB (ref 0.61–1.24)
GFR calc non Af Amer: 59 mL/min — ABNORMAL LOW (ref >60)
GLUCOSE: 101 mg/dL — AB (ref 65–99)
Potassium: 3.9 mmol/L (ref 3.5–5.1)
Sodium: 138 mmol/L (ref 135–145)

## 2017-10-21 LAB — PROTIME-INR
INR: 2.65
PROTHROMBIN TIME: 28.1 s — AB (ref 11.4–15.2)

## 2017-10-21 LAB — CBC
HCT: 36.8 % — ABNORMAL LOW (ref 39.0–52.0)
Hemoglobin: 11.8 g/dL — ABNORMAL LOW (ref 13.0–17.0)
MCH: 33 pg (ref 26.0–34.0)
MCHC: 32.1 g/dL (ref 30.0–36.0)
MCV: 102.8 fL — ABNORMAL HIGH (ref 78.0–100.0)
PLATELETS: 259 10*3/uL (ref 150–400)
RBC: 3.58 MIL/uL — AB (ref 4.22–5.81)
RDW: 12.1 % (ref 11.5–15.5)
WBC: 3.8 10*3/uL — AB (ref 4.0–10.5)

## 2017-10-21 LAB — LACTATE DEHYDROGENASE: LDH: 213 U/L — AB (ref 98–192)

## 2017-10-21 NOTE — Progress Notes (Addendum)
Patient presents for 2 month follow up. Pt denies any VAD alarms since lead re-build.    Pt has an appt w/[facility] on 4/29 to see Dr. Aundria Rud regarding heart transplant.  Vital Signs:  Doppler Pressure: 92 Automatc BP:  109/92 (100) HR:  90 SPO2: 99  Weight: 204 lb w/o eqt Last weight: 202 lb Home weights: Does not wiegh   VAD Indication: DT-eval at [facility] - requested repeat CT chest in Aug due to "spot on lung" 01/21/17 - info sent to [facility]. appt on April 29th  VAD interrogation & Equipment Management (reviewed with Dr. Shirlee Latch): Speed:9400 Flow: 4.4 Power:  5.3 w    PI:  6.0  Alarms: few low voltage advisories Events: rare  Fixed speed 9400 Low speed limit: 8800  Primary Controller:  Replace back up battery in 21 months Back up controller:   Replace back up battery in 29 months.  Annual Equipment Maintenance on UBC/PM due 5/19.  I reviewed the LVAD parameters from today and compared the results to the patient's prior recorded data. LVAD interrogation was NEGATIVE for significant power changes, negative for clinical alarms and STABLE for PI events/speed drops. Pt is performing daily controller and system monitor self tests along with completing weekly and monthly maintenance for LVAD equipment.  LVAD equipment check completed and is in good working order. Back-up equipment present.   Exit Site Care: Drive line is being maintained weekly  by patient. Drive line exit site well healed and incorporated. The velour is fully implanted at exit site. Dressing dry and intact. No erythema or drainage. Stabilization device present and accurately applied. Pt denies fever or chills. Pt provided with 8 weekly dressing kits and 5 extra anchors   Significant Events on VAD Support: 08/08/17 - drive line fracture with lead re-build  Device:none  BP & Labs:  MAP: 92  Hgb  - 11.8. Denies S/S of bleeding. Specifically denies melena/BRBPR or nosebleeds.  LDH 213 - established baseline  of 280- 320. Denies tea-colored urine. No power elevations noted on interrogation.   Patient Instructions: 1. No change in meds. 2. Will arrange for home INR machine. 3. Return to VAD clinic in 2 months w/ annual maintenance.    Carlton Adam RN VAD Coordinator   Office: [phone] 24/7 Emergency VAD Pager: [phone]        LVAD CLINIC NOTE Cardiology: Dr. Shirlee Latch  Mr. Boitano is a 62 y.o. with history of nonischemic cardiomyopathy/chronic systolic CHF (EF 8% by MRI) and LV thrombus now s/p LVAD placement on 11/21/15 presents for followup of chronic systolic CHF/LVAD.      He is no longer taking ASA 81.  While on ASA, he was having 2-3 major nosebleeds a week.  None off ASA.  He has tried a couple of times to restart with no success.  He remains on warfarin with no BRBPR or melena.   He was admitted in 1/19 with driveline fracture.  This was able to be repaired.  No alarms since that time.    He is doing well today. No exertional dyspnea.  He is very active.  Taking Lasix 20 mg daily now.  MAP 90 today.  No lightheadedness.  Overall feels very good.   LVAD interrogation  See LVAD nurse's note from today.   Labs (5/17): creatinine 1.05 => 0.91, HCT 30, LDH 235 => 294 Labs (6/17): K 3.7, creatinine 1.17, LDL 262 => 281, HCT 36.3, INR 2.6 Labs (8/17): hgb 11.2, LDH 243, creatinine 1.09 Labs (10/17): K  4.1, creatinine 1.11, hgb 12.4, LDH 262 Labs (12/17): LDH 284, K 4.2, creatinine 1.14, hgb 11.5 Labs (9/18): hgb 11.9 Labs (1/19): K 3.8, creatinine 1.2, hgb 11.6 Labs (2/19): K 4, creatinine 1.21, hgb 12.3  PMH: 1. Chronic systolic CHF: Nonischemic cardiomyopathy.  Cause uncertain => not a heavy drinker, had one sister with what sounds like sudden death. Possible viral myocarditis.   - LHC/RHC (4/27): No angiographic CAD.  Mean RA 22, PA 57/34, mean PCWP 35, CI 1.39, PVR 2.6 WU. - Echo (4/17): Severely dilated LV with EF 10-15%.  - Cardiac MRI (4/17): EF 8%, severely dilated LV,  LV thrombus, mildly dilated RV with moderately decreased systolic function, patchy inferior LGE pattern could be consistent with prior myocarditis.  - Heartmate II LVAD placed 11/21/15.  - Echo 5/18 with EF 10-15% 2. LV thrombus.  3. HTN 4. Epistaxis  FH: No history of premature CAD or cardiomyopathy.   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  Nonsmoker.  Rare ETOH.   ROS: All systems reviewed and negative except as per HPI.   Current Outpatient Medications  Medication Sig Dispense Refill  . acetaminophen (TYLENOL) 500 MG tablet Take 1,000 mg by mouth daily as needed for moderate pain or headache.     . Blood Pressure Monitoring (BLOOD PRESSURE CUFF) MISC 1 Units by Does not apply route daily. 1 each 0  . furosemide (LASIX) 20 MG tablet Take 20 mg by mouth daily.    . magnesium hydroxide (MILK OF MAGNESIA) 400 MG/5ML suspension Take 15 mLs by mouth daily as needed for mild constipation.    Marland Kitchen oxyCODONE (ROXICODONE) 15 MG immediate release tablet Take 15 mg by mouth 2 (two) times daily.  0  . sacubitril-valsartan (ENTRESTO) 49-51 MG Take 1 tablet by mouth 2 (two) times daily. 60 tablet 11  . tadalafil (CIALIS) 5 MG tablet Take 1 tablet (5 mg total) by mouth daily. 30 tablet 2  . tetrahydrozoline 0.05 % ophthalmic solution Place 1 drop into both eyes as needed (dry, red eyes).    . warfarin (COUMADIN) 5 MG tablet Take  5 mg (1 tablet) daily except 7.5 mg (1 and 1/2 tablets) on Monday, Friday     No current facility-administered medications for this encounter.    BP (!) 158/91 Comment: 111-map  Pulse 90   Ht 5\' 11"  (1.803 m)   Wt 204 lb (92.5 kg)   SpO2 99%   BMI 28.45 kg/m   Vital signs: MAP 90 GENERAL: Well appearing this am. NAD.  HEENT: Normal. NECK: Supple, JVP 7-8 cm. Carotids OK.  CARDIAC:  Mechanical heart sounds with LVAD hum present.  LUNGS:  CTAB, normal effort.  ABDOMEN:  NT, ND, no HSM. No bruits or masses. +BS  LVAD  exit site: Well-healed and incorporated. Dressing dry and intact. No erythema or drainage. Stabilization device present and accurately applied. Driveline dressing changed daily per sterile technique. EXTREMITIES:  Warm and dry. No cyanosis, clubbing, rash, or edema.  NEUROLOGIC:  Alert & oriented x 3. Cranial nerves grossly intact. Moves all 4 extremities w/o difficulty. Affect pleasant    Assessment/Plan:  1. HeartMate II LVAD: Patient is doing well symptomatically, NYHA class I-II.  Recently admitted for driveline repair (1/19).  On exam today, he is not volume overloaded. MAP controlled.  Few PI events, no alarms on LVAD interrogation. - Continue Entresto 49/51 bid.       - Continue warfarin with INR goal 2-2.5.  LDH has been stable. He is off ASA due to intractable nose bleeds when taking ASA.  - He is interested in transplant evaluation.  He has an appointment set up at [facility]. 2. Chronic systolic CHF: Much improved since LVAD placement, now NYHA class I-II.  He is exercising regularly.  3. LV thrombus: Noted on pre-op cMRI.  Continue warfarin.                                                                                                                                                                                           [name] 10/22/2017

## 2017-11-07 LAB — PROTIME-INR: INR: 3 — AB (ref <=1.1)

## 2017-11-08 ENCOUNTER — Ambulatory Visit (HOSPITAL_COMMUNITY): Payer: Self-pay | Admitting: Pharmacist

## 2017-11-08 DIAGNOSIS — Z95811 Presence of heart assist device: Secondary | ICD-10-CM

## 2017-11-29 ENCOUNTER — Ambulatory Visit (HOSPITAL_COMMUNITY): Payer: Self-pay | Admitting: Pharmacist

## 2017-11-29 DIAGNOSIS — Z95811 Presence of heart assist device: Secondary | ICD-10-CM

## 2017-11-29 LAB — PROTIME-INR: INR: 2.2 — AB (ref <=1.1)

## 2017-12-10 IMAGING — CR DG CHEST 1V PORT
1 series · 1 of 1 positions shown · non-contrast
Comparison: 11/20/2015 and 11/17/2015.

CLINICAL DATA: Status post left ventricular assist device
placement. History of congestive heart failure and hypertension.

EXAM:
PORTABLE CHEST 1 VIEW

[AP]
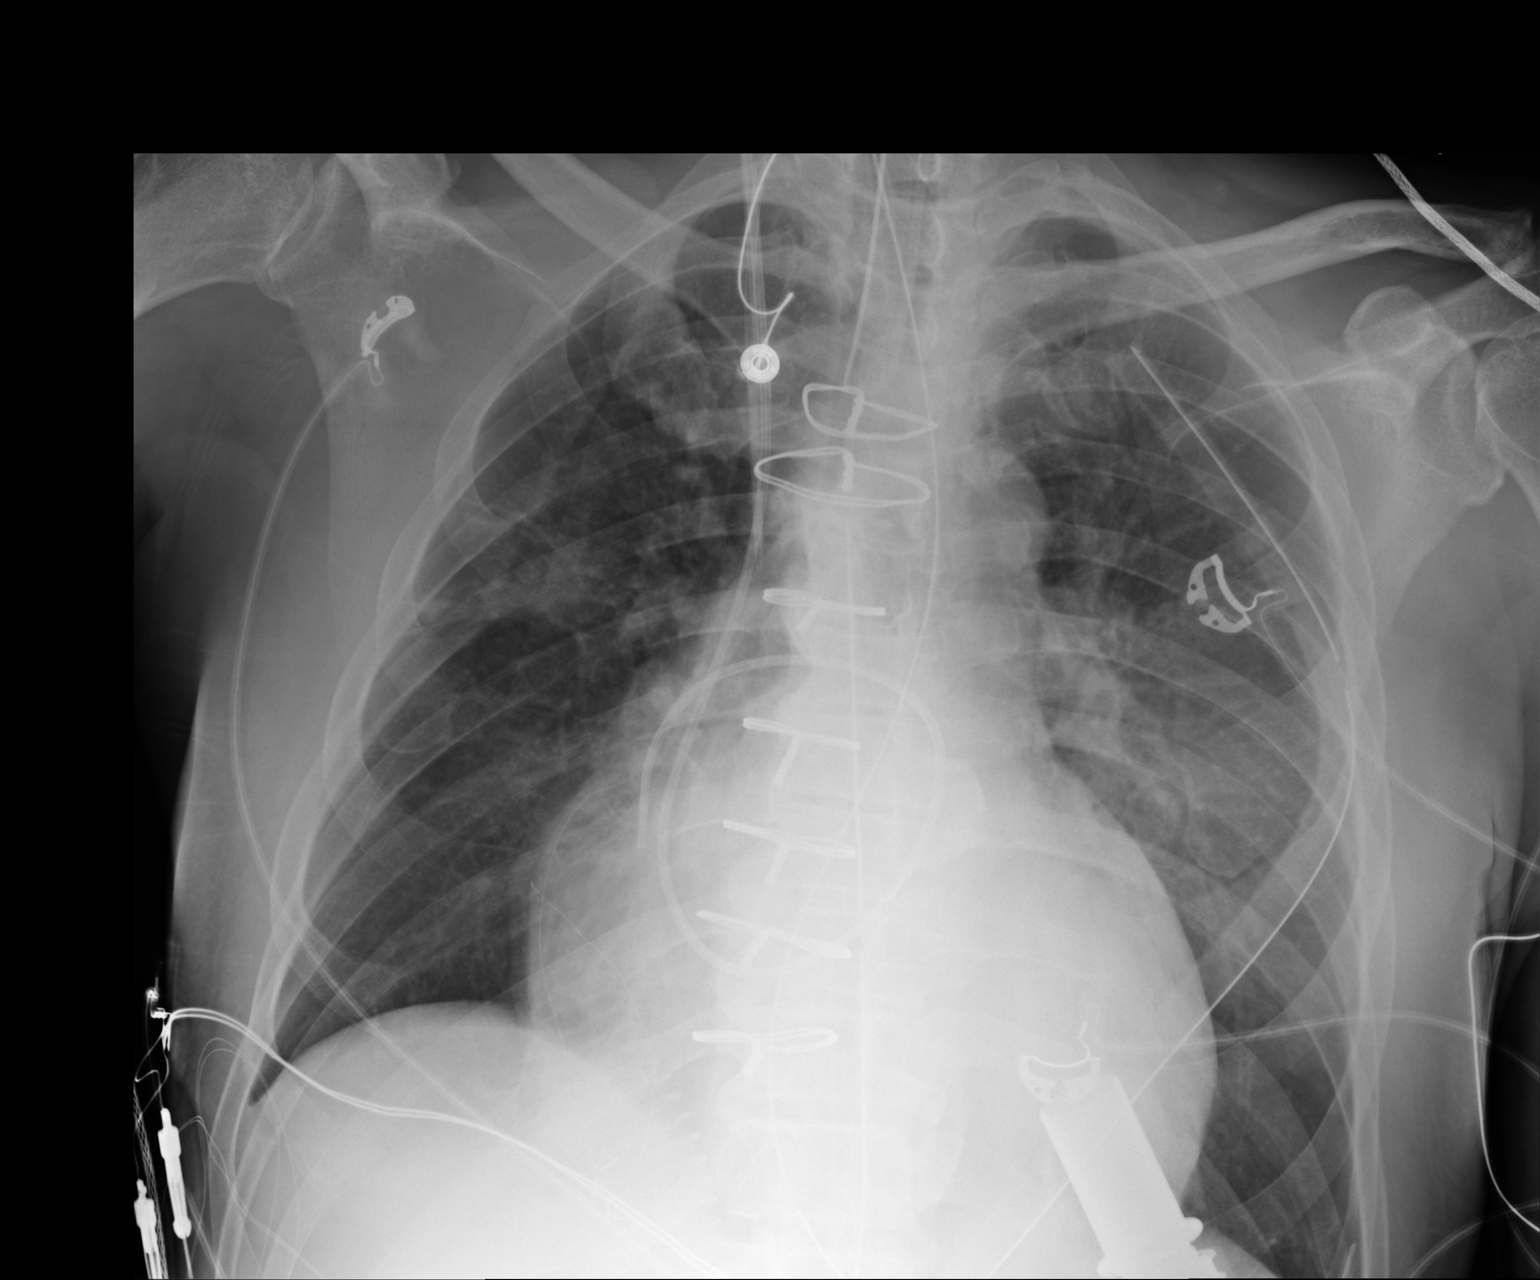

[1 of 1 positions shown; findings below may reference images not displayed]

FINDINGS: 9439 hours. Endotracheal tube tip is in the midtrachea. A
nasogastric tube projects below the diaphragm. There is a right IJ
central venous catheter extending to the mid SVC level. There is
also a right IJ Swan-Ganz catheter with its tip in the right lower
lobe pulmonary artery. Left chest tube and mediastinal drain are in
place. Left ventricular assist device overlies the cardiac apex with
interval decrease in size of the heart status post median
sternotomy.

There is no pneumothorax or significant pleural effusion. Right
perihilar airspace disease appears unchanged. There is mildly
increased opacity in the left perihilar region.
IMPRESSION: 1. Interval left ventricular assist device placement without
complicating pneumothorax.
2. Mild increase in left perihilar airspace disease. The right
perihilar opacity appears unchanged.

## 2017-12-13 IMAGING — CR DG CHEST 1V PORT
1 series · 1 of 1 positions shown · non-contrast
Comparison: 11/23/2015

CLINICAL DATA: Cardiac failure with a LVAD

EXAM:
PORTABLE CHEST 1 VIEW

[AP]
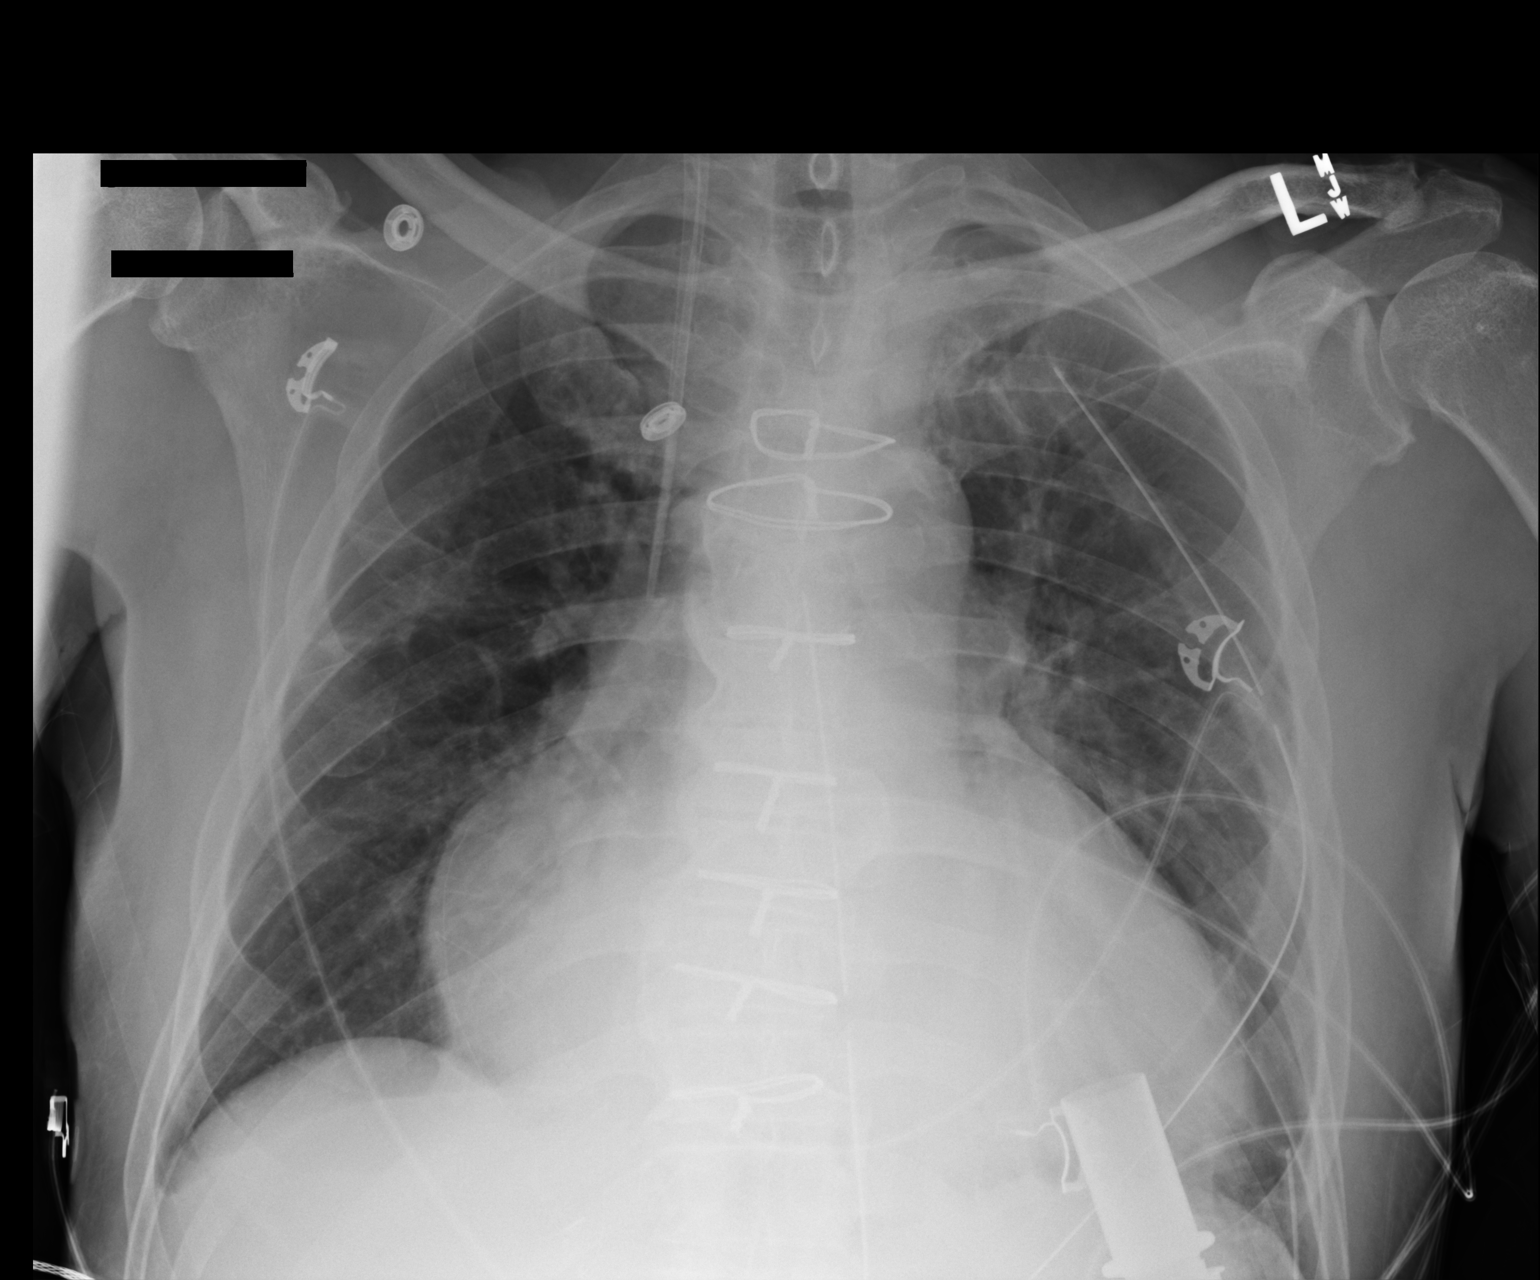

[1 of 1 positions shown; findings below may reference images not displayed]

FINDINGS: Cardiac shadow remains enlarged. A mediastinal drain is seen as well
as left ventricular assist device inferiorly. A left thoracostomy
catheter is noted. No pneumothorax is seen. Two right jugular lines
are noted and stable. The lungs are well-aerated without focal
infiltrate. Minimal atelectatic changes are noted in the right mid
lung stable from the prior study.
IMPRESSION: Overall stable appearance of the chest when compared with the prior
exam.

## 2017-12-14 IMAGING — CR DG CHEST 1V PORT
1 series · 1 of 1 positions shown · non-contrast
Comparison: Yesterday

CLINICAL DATA: Insertion of LVAD.

EXAM:
PORTABLE CHEST 1 VIEW

[AP]
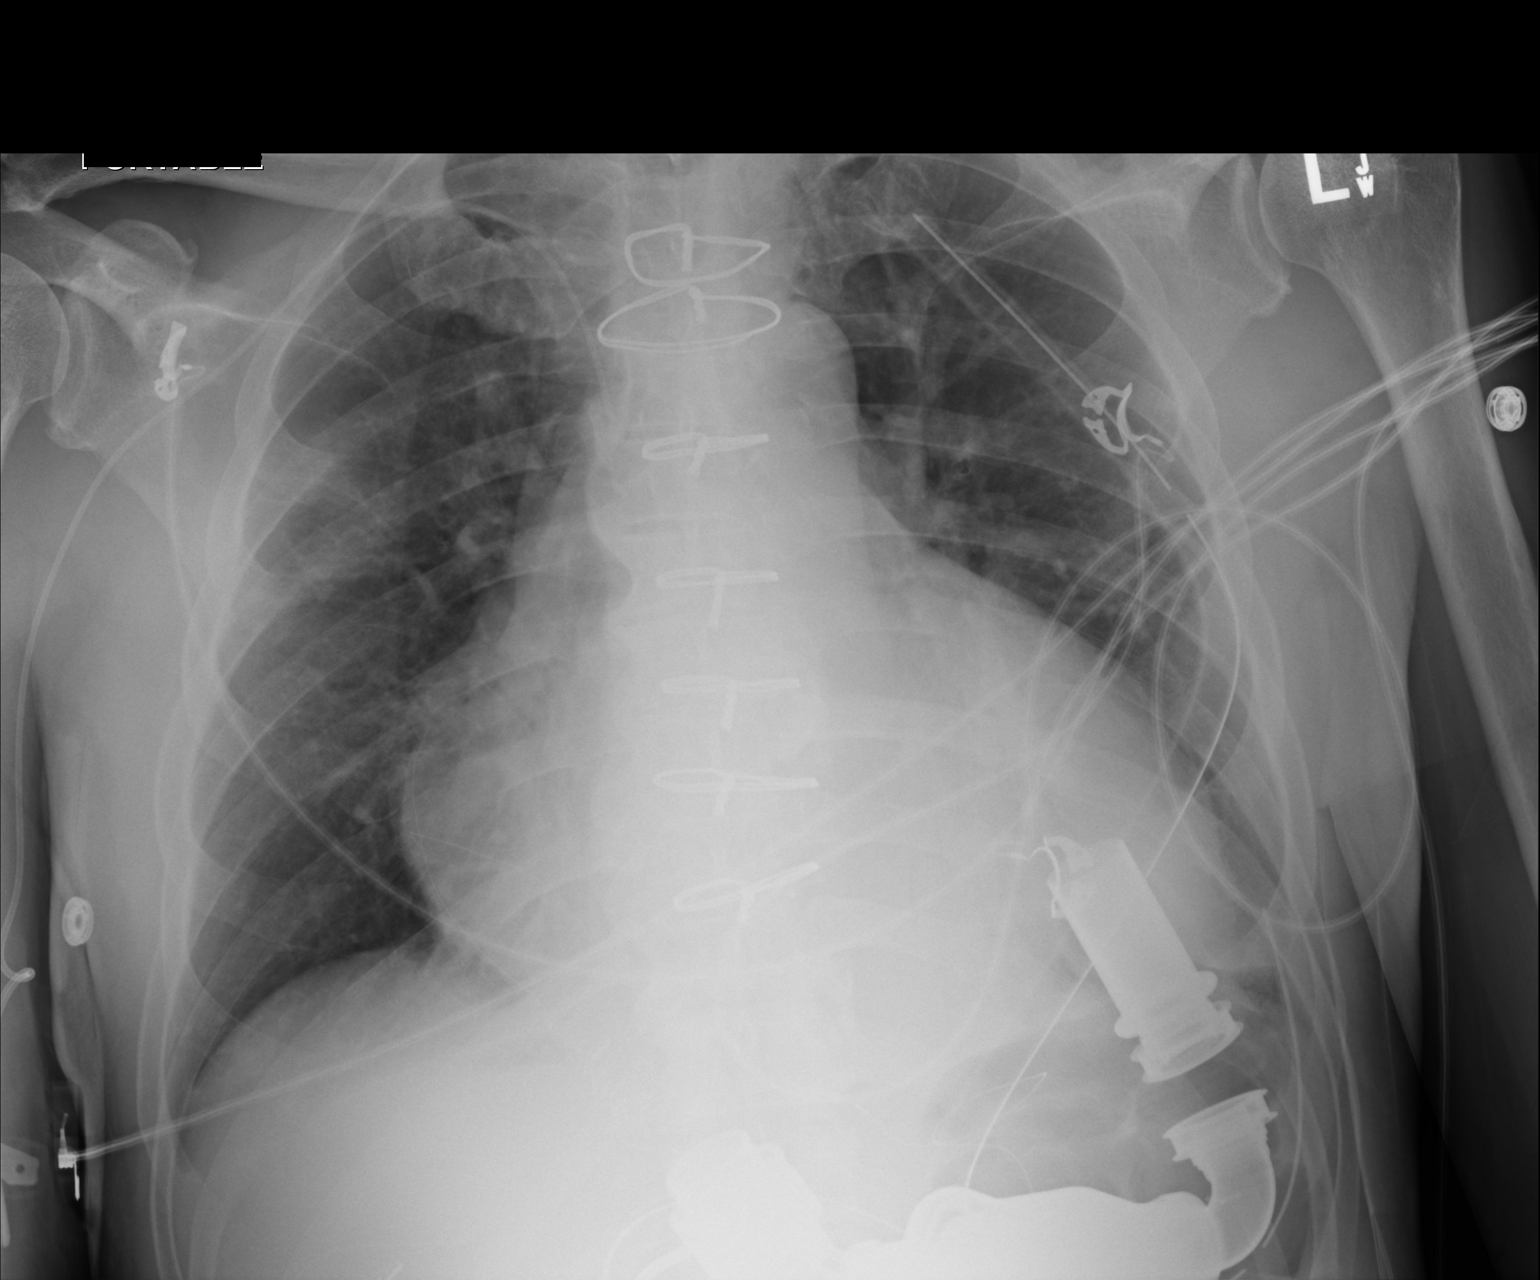

[1 of 1 positions shown; findings below may reference images not displayed]

FINDINGS: Unchanged cardiopericardial enlargement. Partly visible LVAD.
Remaining thoracic drains in stable position. New right upper
extremity PICC with tip at the upper SVC. Unchanged retrocardiac
opacity, presumably atelectasis. Small left apical pneumothorax that
is stable from yesterday.
IMPRESSION: 1. New right upper extremity PICC with tip near the upper SVC.
2. Unchanged trace left apical pneumothorax.
3. Stable aeration.  No pulmonary edema.

## 2017-12-15 IMAGING — CR DG CHEST 1V PORT
1 series · 1 of 1 positions shown · non-contrast
Comparison: November 25, 2015

CLINICAL DATA: LVAD present.

EXAM:
PORTABLE CHEST 1 VIEW

[AP]
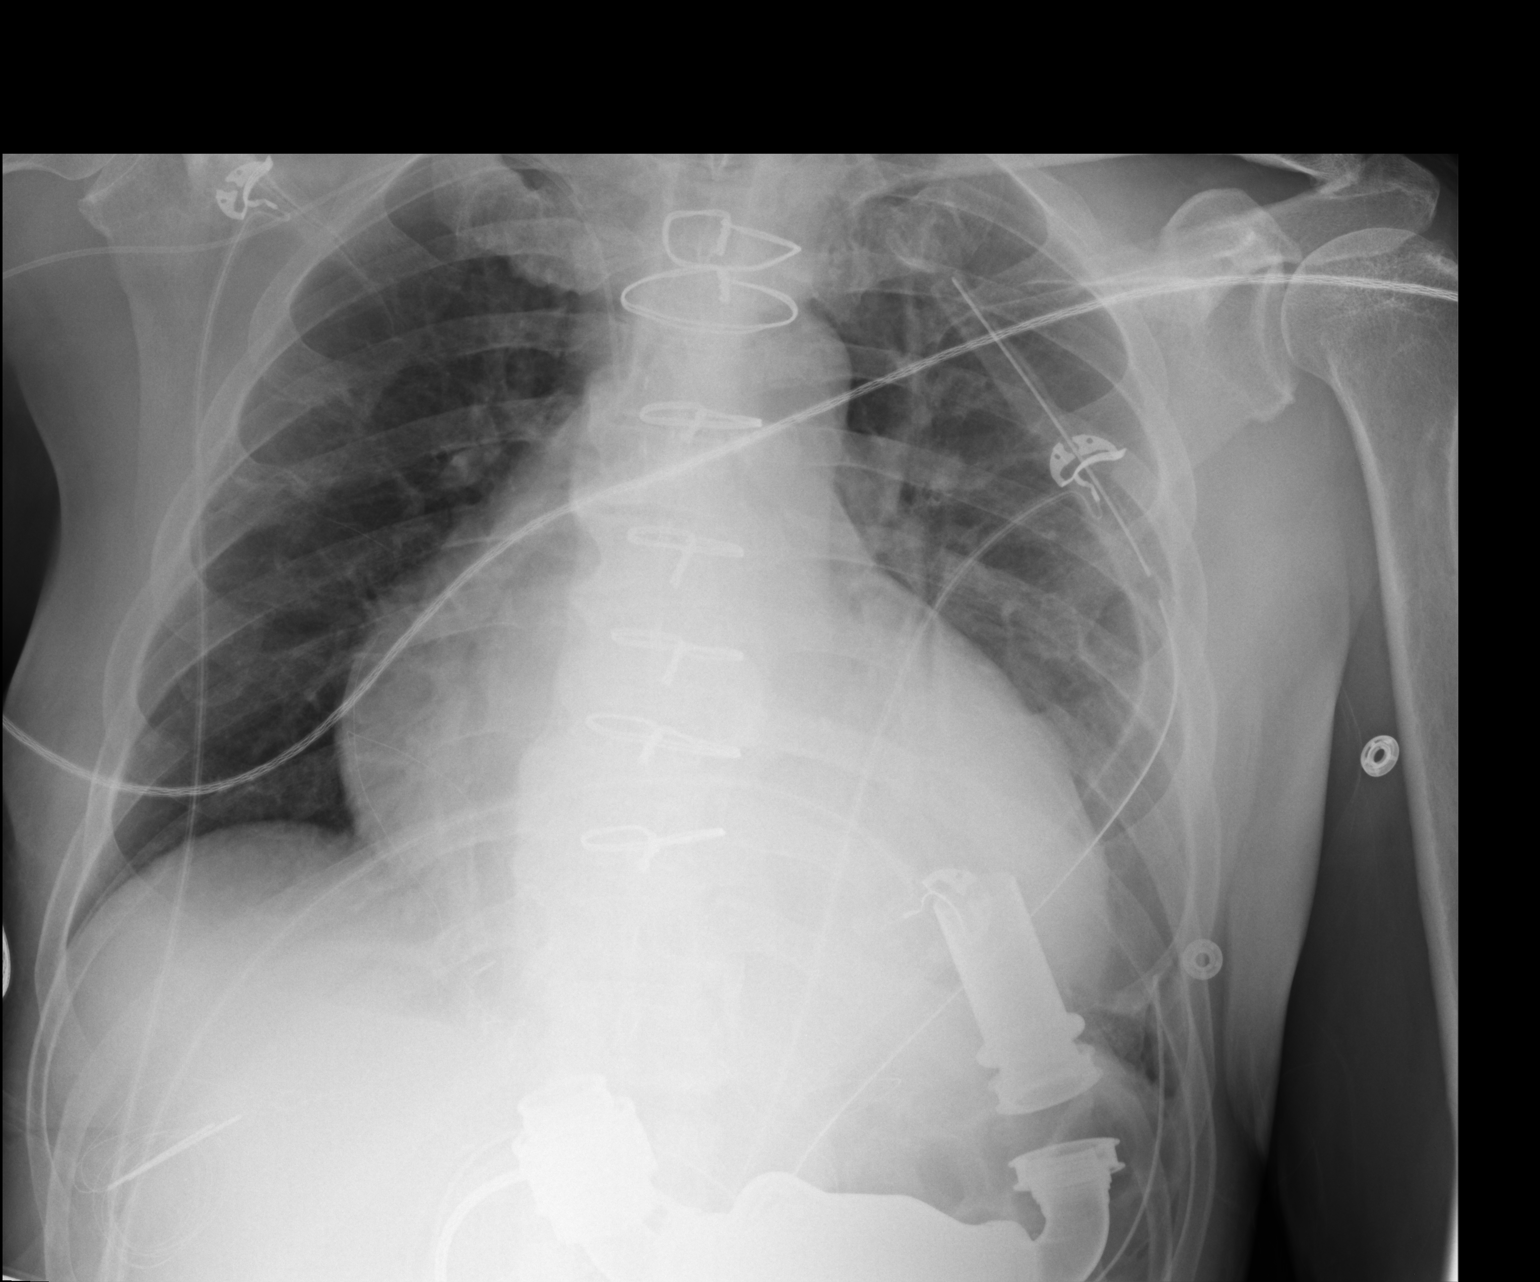

[1 of 1 positions shown; findings below may reference images not displayed]

FINDINGS: A tiny left apical pneumothorax remains. No right-sided
pneumothorax. The LVAD is partially visible and in stable position.
A right PICC line is unchanged. Stable cardiomegaly. The hila and
mediastinum are stable. No pulmonary nodules, masses, or new
infiltrates.
IMPRESSION: Stable tiny left apical pneumothorax. The LVAD is in stable
position. No other change.

## 2017-12-16 IMAGING — CR DG CHEST 1V PORT
1 series · 1 of 1 positions shown · non-contrast
Comparison: 11/26/2015

CLINICAL DATA: Left ventricular assist device

EXAM:
PORTABLE CHEST 1 VIEW

[AP]
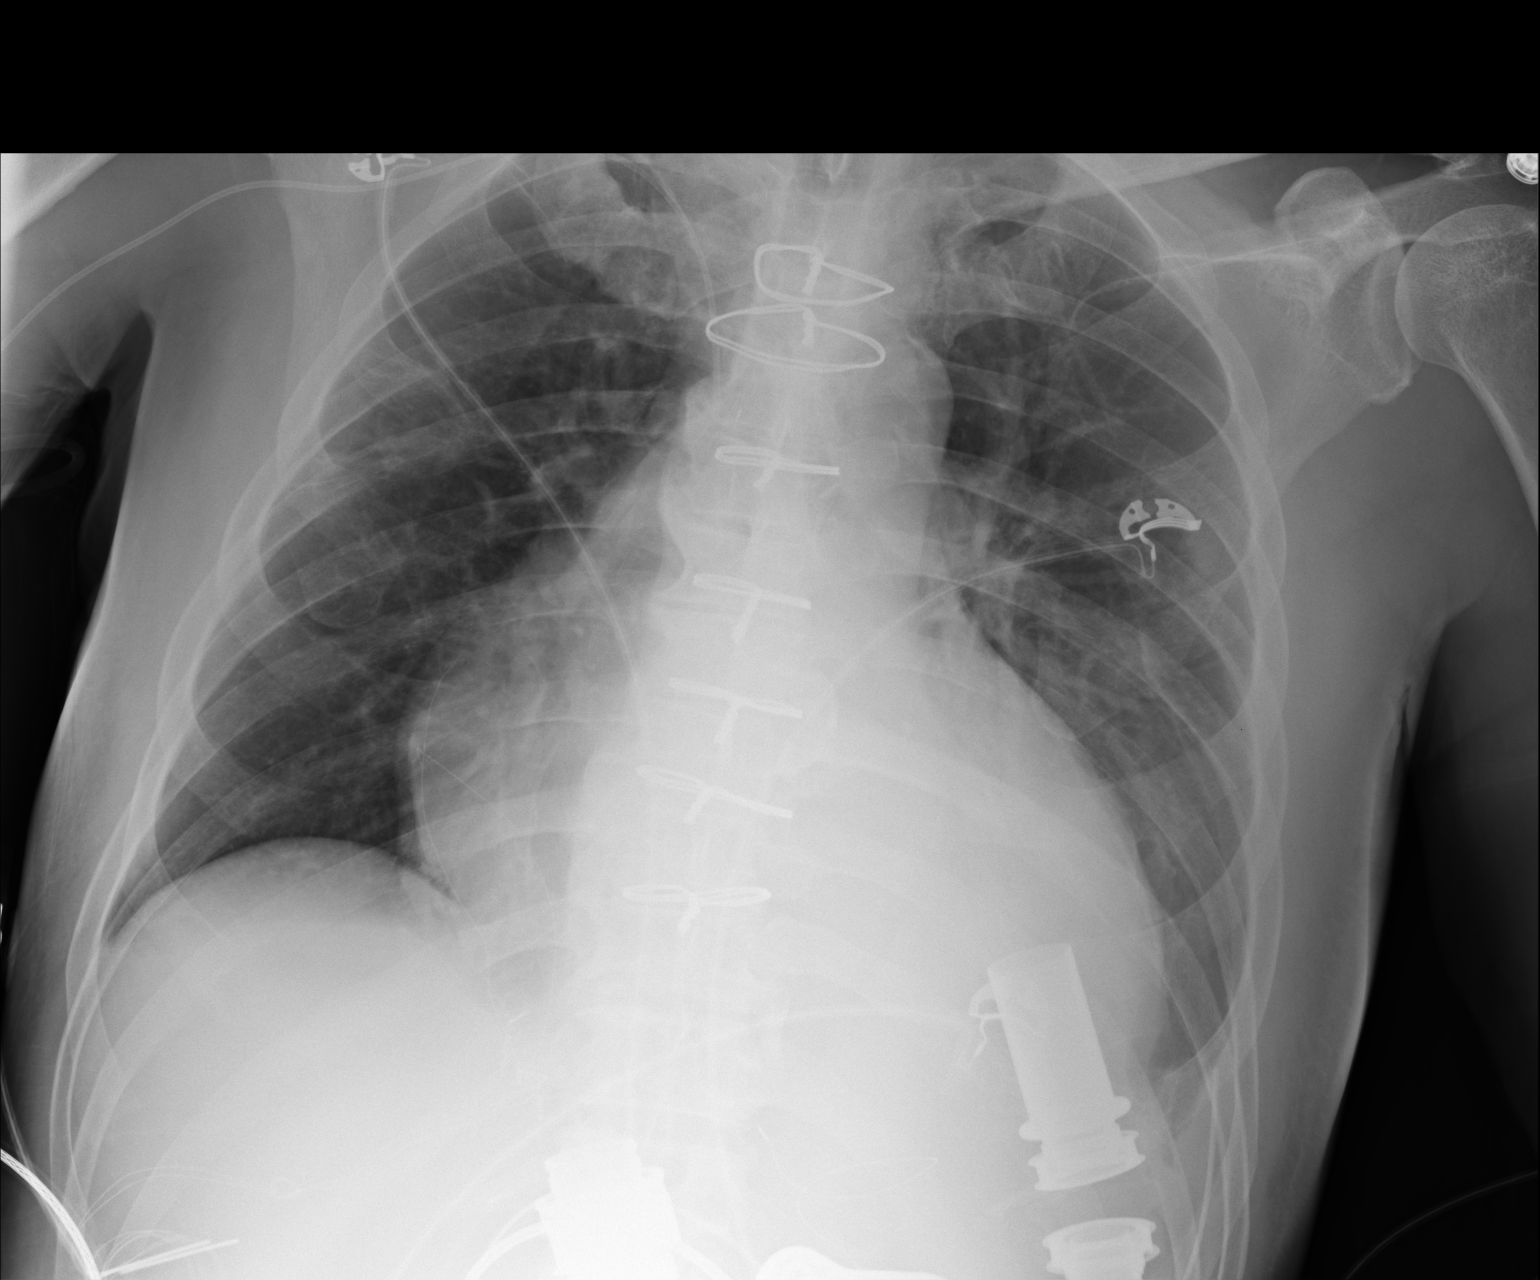

[1 of 1 positions shown; findings below may reference images not displayed]

FINDINGS: Cardiomediastinal size a cardiomegaly again noted. Left ventricular
assist device is unchanged in position. Left chest tube has been
removed. Right arm PICC line is stable in position. Stable tiny left
apical pneumothorax. Status post median sternotomy.
IMPRESSION: Left ventricular assist device is unchanged in position. Left chest
tube has been removed. Cardiomegaly. Status post median sternotomy.
Stable tiny left apical pneumothorax.

## 2017-12-19 ENCOUNTER — Inpatient Hospital Stay (HOSPITAL_COMMUNITY): Admission: RE | Admit: 2017-12-19 | Payer: Medicaid Other | Source: Ambulatory Visit

## 2017-12-20 LAB — PROTIME-INR: INR: 1.7 — AB (ref <=1.1)

## 2017-12-23 ENCOUNTER — Ambulatory Visit (HOSPITAL_COMMUNITY): Payer: Self-pay | Admitting: Pharmacist

## 2017-12-23 DIAGNOSIS — Z95811 Presence of heart assist device: Secondary | ICD-10-CM

## 2018-01-01 ENCOUNTER — Ambulatory Visit (HOSPITAL_COMMUNITY): Payer: Self-pay | Admitting: Pharmacist

## 2018-01-01 ENCOUNTER — Ambulatory Visit (HOSPITAL_COMMUNITY)
Admission: RE | Admit: 2018-01-01 | Discharge: 2018-01-01 | Disposition: A | Discharge status: Home / Self Care | Payer: Self-pay | Source: Ambulatory Visit | Attending: Internal Medicine | Admitting: Internal Medicine

## 2018-01-01 ENCOUNTER — Other Ambulatory Visit (HOSPITAL_COMMUNITY): Payer: Self-pay | Admitting: Unknown Physician Specialty

## 2018-01-01 VITALS — BP 90/0 | HR 78 | Ht 71.0 in | Wt 200.8 lb

## 2018-01-01 DIAGNOSIS — I429 Cardiomyopathy, unspecified: Secondary | ICD-10-CM | POA: Insufficient documentation

## 2018-01-01 DIAGNOSIS — Z95811 Presence of heart assist device: Secondary | ICD-10-CM

## 2018-01-01 DIAGNOSIS — Z79891 Long term (current) use of opiate analgesic: Secondary | ICD-10-CM | POA: Insufficient documentation

## 2018-01-01 DIAGNOSIS — Z7901 Long term (current) use of anticoagulants: Secondary | ICD-10-CM | POA: Insufficient documentation

## 2018-01-01 DIAGNOSIS — I110 Hypertensive heart disease with heart failure: Secondary | ICD-10-CM | POA: Insufficient documentation

## 2018-01-01 DIAGNOSIS — Z79899 Other long term (current) drug therapy: Secondary | ICD-10-CM | POA: Insufficient documentation

## 2018-01-01 DIAGNOSIS — I5022 Chronic systolic (congestive) heart failure: Secondary | ICD-10-CM | POA: Insufficient documentation

## 2018-01-01 LAB — COMPREHENSIVE METABOLIC PANEL
ALBUMIN: 3.7 g/dL (ref 3.5–5.0)
ALT: 17 U/L (ref 17–63)
ANION GAP: 9 (ref 5–15)
AST: 30 U/L (ref 15–41)
Alkaline Phosphatase: 74 U/L (ref 38–126)
BILIRUBIN TOTAL: 1.2 mg/dL (ref 0.3–1.2)
BUN: 9 mg/dL (ref 6–20)
CHLORIDE: 105 mmol/L (ref 101–111)
CO2: 25 mmol/L (ref 22–32)
Calcium: 9 mg/dL (ref 8.9–10.3)
Creatinine, Ser: 1.35 mg/dL — ABNORMAL HIGH (ref 0.61–1.24)
GFR calc Af Amer: >60 mL/min (ref >60)
GFR calc non Af Amer: 56 mL/min — ABNORMAL LOW (ref >60)
GLUCOSE: 110 mg/dL — AB (ref 65–99)
POTASSIUM: 4.2 mmol/L (ref 3.5–5.1)
Sodium: 139 mmol/L (ref 135–145)
TOTAL PROTEIN: 8.5 g/dL — AB (ref 6.5–8.1)

## 2018-01-01 LAB — CBC
HEMATOCRIT: 39.1 % (ref 39.0–52.0)
HEMOGLOBIN: 12.5 g/dL — AB (ref 13.0–17.0)
MCH: 33.6 pg (ref 26.0–34.0)
MCHC: 32 g/dL (ref 30.0–36.0)
MCV: 105.1 fL — AB (ref 78.0–100.0)
Platelets: 222 10*3/uL (ref 150–400)
RBC: 3.72 MIL/uL — ABNORMAL LOW (ref 4.22–5.81)
RDW: 12.3 % (ref 11.5–15.5)
WBC: 3.8 10*3/uL — AB (ref 4.0–10.5)

## 2018-01-01 LAB — PREALBUMIN: PREALBUMIN: 24 mg/dL (ref 18–38)

## 2018-01-01 LAB — PROTIME-INR
INR: 2.07
PROTHROMBIN TIME: 23.1 s — AB (ref 11.4–15.2)

## 2018-01-01 LAB — LACTATE DEHYDROGENASE: LDH: 225 U/L — AB (ref 98–192)

## 2018-01-01 NOTE — Progress Notes (Addendum)
Patient presents for 2 month follow up. Pt denies any VAD alarms since lead re-build.    Pt has an appt w/[facility] on 7/29 to see Dr. Aundria Rud regarding heart transplant. Pt states they are supposed to list him for transplant.  Vital Signs:  Doppler Pressure: 90 Automatc BP:  102/58 (76) HR:  78 SPO2: 96  Weight: 200.8 lb w/o eqt Last weight: 204 lb Home weights: Does not wiegh   VAD Indication: DT-eval at [facility] - requested repeat CT chest in Aug due to "spot on lung" 01/21/17 - info sent to [facility]. appt on April 29th has f/u on July 29th per pt  VAD interrogation & Equipment Management (reviewed with Dr. Shirlee Latch): Speed:9400 Flow: 3.9 Power:  5.0 w    PI:  5.5  Alarms: few low voltage advisories Events: rare  Fixed speed 9400 Low speed limit: 8800  Primary Controller:  Replace back up battery in 19 months Back up controller:   Replace back up battery in 28 months.  Annual Equipment Maintenance on UBC/PM performed today 6/19.  I reviewed the LVAD parameters from today and compared the results to the patient's prior recorded data. LVAD interrogation was NEGATIVE for significant power changes, negative for clinical alarms and STABLE for PI events/speed drops. Pt is performing daily controller and system monitor self tests along with completing weekly and monthly maintenance for LVAD equipment.  LVAD equipment check completed and is in good working order. Back-up equipment present.   Exit Site Care: Drive line is being maintained weekly  by patient. Drive line exit site well healed and incorporated. The velour is fully implanted at exit site. Dressing dry and intact. No erythema or drainage. Stabilization device present and accurately applied. Pt denies fever or chills. Pt states he has plenty of dressings at home.  Significant Events on VAD Support: 08/08/17 - drive line fracture with lead re-build  Device:none  BP & Labs:  MAP: 90 modified systolic  Hgb  - 96.2. Denies  S/S of bleeding. Specifically denies melena/BRBPR or nosebleeds.  LDH  225 - established baseline of 280- 320. Denies tea-colored urine. No power elevations noted on interrogation.  Reviewed and demonstrated the following to patient and caregiver:               Reviewed the steps for replacing the running system controller with the back-up system controller (see patient handbook section 2 or the appropriate pamphlet).             X  Demonstrated (using the mock-driveline and controller) how to connect and disconnect the mock-driveline in back up system controller in a timely manner (less than 10 seconds) with return demonstration by patient and caregiver.              X   Reviewed system controller alarms and troubleshooting including hazard and advisory alarms and accessing alarm history. Re-enforced NOT TO attempt to perform any task displayed on the display screen alone or without calling the VAD pager [phone] (see patient handbook section 5 or the appropriate pamphlet).                       X  Reviewed how to handle an emergency including when the pump is running and when the pump has stopped (see patient handbook section 8). Call 911 first and then the VAD pager at [phone].             X   Reviewed 14-volt lithium ion battery  calibration steps (see patient handbook section 3).            X   Reviewed contents of black bag and what must be with patient at all times.            X  Reviewed driveline exit site including cleansing, dressing, and immobilizing with an anchor device to prevent exit site trauma.             X  Reviewed weekly maintenance which includes: Reviewing "Replacing the Running System Controller with a [employer]" pamphlet. Clean batteries, clips, and battery charger contacts.  Check cables for damages. Rotate batteries; keep all 8 charged.              X  Reviewed monthly maintenance which includes: Reviewing Alarms and Troubleshooting. Check  battery manufacturer dates. Check use/charge cycles for each battery; remember to re-calibrate every 70 uses when prompted.              X  Additional pamphlets Guide to Replacing the Running System Controller with the Backup Controller for Patients and Their Caregivers and HM II Alarms for Patients and Their Caregivers given to patient.              X            Annual maintenance completed per [employer] on patient's home power module and Warehouse manager.    Backup system controller 11 volt battery charged during visit.   2 year Intermacs follow up completed including:  Quality of Life, KCCQ-12, and Neurocognitive trail making (45 secs)   Pt adamantly refused 6 min walk. Pt states that he does not want to complete questions either and  States he "doesn't have time for this."   Patient Instructions: 1. No change in meds. 2. Return to VAD clinic in 2 months.    Matthew Adam RN VAD Coordinator   Office: [phone] 24/7 Emergency VAD Pager: [phone]        LVAD CLINIC NOTE Cardiology: Dr. Shirlee Latch  Matthew Vaughan is a 62 y.o. with history of nonischemic cardiomyopathy/chronic systolic CHF (EF 8% by MRI) and LV thrombus now s/p LVAD placement on 11/21/15 presents for followup of chronic systolic CHF/LVAD.      He is no longer taking ASA 81.  While on ASA, he was having 2-3 major nosebleeds a week.  None off ASA.  He has tried a couple of times to restart with no success.  He remains on warfarin with no BRBPR or melena.   He was admitted in 1/19 with driveline fracture.  This was able to be repaired.  No alarms since that time.    He is doing well today. He continues to be very active. No exertional dyspnea.  No orthopnea/PND.  No BRBPR/melena. MAP 90, improved.  However, he is only taking his Entresto once a day.  LVAD parameters reviewed and stable. He has been seen at [facility] for transplant evaluation, and will be returning there soon.    LVAD interrogation  See LVAD  nurse's note from today.   Labs (5/17): creatinine 1.05 => 0.91, HCT 30, LDH 235 => 294 Labs (6/17): K 3.7, creatinine 1.17, LDL 262 => 281, HCT 36.3, INR 2.6 Labs (8/17): hgb 11.2, LDH 243, creatinine 1.09 Labs (10/17): K 4.1, creatinine 1.11, hgb 12.4, LDH 262 Labs (12/17): LDH 284, K 4.2, creatinine 1.14, hgb 11.5 Labs (9/18): hgb 11.9 Labs (1/19): K 3.8, creatinine 1.2, hgb 11.6 Labs (2/19): K 4, creatinine  1.21, hgb 12.3 Labs (4/19): K 3.9, creatinine 1.28  PMH: 1. Chronic systolic CHF: Nonischemic cardiomyopathy.  Cause uncertain => not a heavy drinker, had one sister with what sounds like sudden death. Possible viral myocarditis.   - LHC/RHC (4/27): No angiographic CAD.  Mean RA 22, PA 57/34, mean PCWP 35, CI 1.39, PVR 2.6 WU. - Echo (4/17): Severely dilated LV with EF 10-15%.  - Cardiac MRI (4/17): EF 8%, severely dilated LV, LV thrombus, mildly dilated RV with moderately decreased systolic function, patchy inferior LGE pattern could be consistent with prior myocarditis.  - Heartmate II LVAD placed 11/21/15.  - Echo 5/18 with EF 10-15% 2. LV thrombus.  3. HTN 4. Epistaxis  FH: No history of premature CAD or cardiomyopathy.   SH: Divorced, lives part-time in [place] and part-time in [place], worked as Art gallery manager.  Nonsmoker.  Rare ETOH.   ROS: All systems reviewed and negative except as per HPI.   Current Outpatient Medications  Medication Sig Dispense Refill  . furosemide (LASIX) 20 MG tablet Take 20 mg by mouth daily.    . magnesium hydroxide (MILK OF MAGNESIA) 400 MG/5ML suspension Take 15 mLs by mouth daily as needed for mild constipation.    Marland Kitchen oxyCODONE (ROXICODONE) 15 MG immediate release tablet Take 15 mg by mouth 2 (two) times daily.  0  . sacubitril-valsartan (ENTRESTO) 49-51 MG Take 1 tablet by mouth 2 (two) times daily. 60 tablet 11  . tetrahydrozoline 0.05 % ophthalmic solution Place 1 drop into both eyes as needed (dry, red eyes).    . warfarin (COUMADIN) 5 MG  tablet Take 5 mg by mouth daily.     Marland Kitchen acetaminophen (TYLENOL) 500 MG tablet Take 1,000 mg by mouth daily as needed for moderate pain or headache.     . Blood Pressure Monitoring (BLOOD PRESSURE CUFF) MISC 1 Units by Does not apply route daily. (Patient not taking: Reported on 01/01/2018) 1 each 0  . tadalafil (CIALIS) 5 MG tablet Take 1 tablet (5 mg total) by mouth daily. (Patient not taking: Reported on 01/01/2018) 30 tablet 2   No current facility-administered medications for this encounter.    BP (!) 90/0   Pulse 78   Ht 5\' 11"  (1.803 m)   Wt 200 lb 12.8 oz (91.1 kg)   SpO2 96%   BMI 28.01 kg/m   MAP 90  General: Well appearing this am. NAD.  HEENT: Normal. Neck: Supple, JVP 7-8 cm. Carotids OK.  Cardiac:  Mechanical heart sounds with LVAD hum present.  Lungs:  CTAB, normal effort.  Abdomen:  NT, ND, no HSM. No bruits or masses. +BS  LVAD exit site: Well-healed and incorporated. Dressing dry and intact. No erythema or drainage. Stabilization device present and accurately applied. Driveline dressing changed daily per sterile technique. Extremities:  Warm and dry. No cyanosis, clubbing, rash, or edema.  Neuro:  Alert & oriented x 3. Cranial nerves grossly intact. Moves all 4 extremities w/o difficulty. Affect pleasant    Assessment/Plan:  1. HeartMate II LVAD: Patient is doing well symptomatically, NYHA class I-II.  Admitted for driveline repair in 1/19.  On exam today, he is not volume overloaded. MAP controlled.  LVAD parameters reviewed and stable. - Continue Entresto 49/51, he needs to take bid rather than daily.       - Continue warfarin with INR goal 2-2.5.  LDH has been stable. He is off ASA due to intractable nose bleeds when taking ASA.  - Ongoing transplant evaluation at [facility].  2. Chronic systolic CHF: Much improved since LVAD placement, now NYHA class I-II.  He is exercising regularly.  3. LV thrombus: Noted on pre-op cMRI.  Continue warfarin.                                                                                                                                                                                         [name] 01/01/2018

## 2018-01-29 ENCOUNTER — Telehealth (HOSPITAL_COMMUNITY): Payer: Self-pay | Admitting: Pharmacist

## 2018-01-29 NOTE — Telephone Encounter (Signed)
LMOM to please get INR checked

## 2018-01-30 LAB — PROTIME-INR: INR: 2 — AB (ref <=1.1)

## 2018-01-31 ENCOUNTER — Ambulatory Visit (HOSPITAL_COMMUNITY): Payer: Self-pay | Admitting: Pharmacist

## 2018-01-31 DIAGNOSIS — Z95811 Presence of heart assist device: Secondary | ICD-10-CM

## 2018-02-03 ENCOUNTER — Other Ambulatory Visit: Payer: Self-pay | Admitting: Internal Medicine

## 2018-02-03 ENCOUNTER — Other Ambulatory Visit (HOSPITAL_COMMUNITY): Payer: Self-pay | Admitting: *Deleted

## 2018-02-03 DIAGNOSIS — Z7901 Long term (current) use of anticoagulants: Secondary | ICD-10-CM

## 2018-02-03 DIAGNOSIS — Z95811 Presence of heart assist device: Secondary | ICD-10-CM

## 2018-02-03 MED ORDER — WARFARIN SODIUM 5 MG PO TABS: 5.0000 mg | ORAL_TABLET | Freq: Every day | ORAL | 11 refills | Status: DC

## 2018-02-19 ENCOUNTER — Telehealth (HOSPITAL_COMMUNITY): Payer: Self-pay | Admitting: Pharmacist

## 2018-02-19 ENCOUNTER — Other Ambulatory Visit (HOSPITAL_COMMUNITY): Payer: Self-pay | Admitting: Cardiology

## 2018-02-19 NOTE — Telephone Encounter (Signed)
Called to remind about INR check. Will go tomorrow to [facility].   Cicero Duck K. Bonnye Fava, PharmD, BCPS, CPP Clinical Pharmacist Phone: [phone] 02/19/2018 2:13 PM

## 2018-02-20 ENCOUNTER — Ambulatory Visit (HOSPITAL_COMMUNITY): Payer: Self-pay | Admitting: Pharmacist

## 2018-02-20 DIAGNOSIS — Z95811 Presence of heart assist device: Secondary | ICD-10-CM

## 2018-02-20 LAB — PROTIME-INR: INR: 1.9 — AB (ref <=1.1)

## 2018-03-03 ENCOUNTER — Encounter (HOSPITAL_COMMUNITY): Payer: Self-pay

## 2018-03-14 ENCOUNTER — Telehealth (HOSPITAL_COMMUNITY): Payer: Self-pay | Admitting: Cardiology

## 2018-03-14 LAB — PROTIME-INR: INR: 2.2 — AB (ref <=1.1)

## 2018-03-14 NOTE — Telephone Encounter (Signed)
INR RESULTS RECEIVED, INR 2.24  Reviewed by Elizabeth Palau, PharmD Per Cicero Duck, PharmD patient will need to continue his current warfarin dose  Patient aware and voiced udnerstanding

## 2018-03-18 ENCOUNTER — Ambulatory Visit (HOSPITAL_COMMUNITY): Payer: Self-pay | Admitting: Pharmacist

## 2018-03-18 DIAGNOSIS — Z95811 Presence of heart assist device: Secondary | ICD-10-CM

## 2018-04-09 ENCOUNTER — Telehealth (HOSPITAL_COMMUNITY): Payer: Self-pay | Admitting: *Deleted

## 2018-04-09 ENCOUNTER — Ambulatory Visit (HOSPITAL_COMMUNITY): Payer: Self-pay | Admitting: Pharmacist

## 2018-04-09 DIAGNOSIS — Z95811 Presence of heart assist device: Secondary | ICD-10-CM

## 2018-04-09 LAB — PROTIME-INR: INR: 2.5 — AB (ref <=1.1)

## 2018-04-09 NOTE — Telephone Encounter (Signed)
Spoke with Mr. Misencik regarding need for a follow up appointment (canceled last follow up.) He states that he will not make an appointment at this time, as he is seeing [facility] October 14-17th, and does not want to put extra mileage on his car making two trips. He said he will go to his [facility] appointment (4 days of testing,) and then "go home to rest for a few weeks," then schedule a follow up appointment. I suggested we could make an appointment for October 17th so he could come to clinic on his way home from [facility] (to not have to make an extra trip.) He is open to this idea, but does not know when he will be released from [facility] Oct 17th. He said he should be receiving an information packet soon, and he will call the clinic and let us know if he can make an appointment for the 17th. Stressed importance of coming for follow up; he verbalized understanding.   Alyce Pagan RN VAD Coordinator  Office: [phone]  24/7 Pager: [phone]

## 2018-04-17 ENCOUNTER — Other Ambulatory Visit (HOSPITAL_COMMUNITY): Payer: Self-pay | Admitting: Cardiology

## 2018-05-13 ENCOUNTER — Telehealth (HOSPITAL_COMMUNITY): Payer: Self-pay | Admitting: Pharmacist

## 2018-05-13 NOTE — Telephone Encounter (Signed)
Called to remind [name] to get his INR checked this week. He will get it checked this Thursday at [facility].   Tyler Deis. Bonnye Fava, PharmD, BCPS, CPP Clinical Pharmacist Phone: [phone] 05/13/2018 4:19 PM

## 2018-05-15 LAB — PROTIME-INR: INR: 2.1 — AB (ref <=1.1)

## 2018-05-16 ENCOUNTER — Ambulatory Visit (HOSPITAL_COMMUNITY): Payer: Self-pay | Admitting: Pharmacist

## 2018-05-16 DIAGNOSIS — Z95811 Presence of heart assist device: Secondary | ICD-10-CM

## 2018-06-18 ENCOUNTER — Telehealth (HOSPITAL_COMMUNITY): Payer: Self-pay | Admitting: Unknown Physician Specialty

## 2018-06-18 ENCOUNTER — Telehealth (HOSPITAL_COMMUNITY): Payer: Self-pay | Admitting: Pharmacist

## 2018-06-18 NOTE — Telephone Encounter (Signed)
I reached out to pt today as he cancelled his last visit with [place] and not rescheduled. Pt refuses to come to clinic until after the New Year. I have scheduled the pt for 07/23/18. Pt was instructed that he must keep this appt with [place] to ensure that his pump is functioning properly and so that we can refill his medications. Pt reassures me that he will be at the appt.   Carlton Adam RN, BSN VAD Coordinator 24/7 Pager [phone]

## 2018-06-18 NOTE — Telephone Encounter (Signed)
Called Matthew Vaughan to remind him to check his INR at [facility]. He will go on Friday morning.   Tyler Deis. Bonnye Fava, PharmD, BCPS, CPP Clinical Pharmacist Phone: [phone] 06/18/2018 3:22 PM

## 2018-06-20 LAB — PROTIME-INR: INR: 2.5 — AB (ref <=1.1)

## 2018-06-24 ENCOUNTER — Ambulatory Visit (HOSPITAL_COMMUNITY): Payer: Self-pay | Admitting: Pharmacist

## 2018-06-24 DIAGNOSIS — Z95811 Presence of heart assist device: Secondary | ICD-10-CM

## 2018-07-22 ENCOUNTER — Telehealth (HOSPITAL_COMMUNITY): Payer: Self-pay | Admitting: *Deleted

## 2018-07-22 NOTE — Telephone Encounter (Signed)
Patient canceled appointment that was scheduled for 07/23/2018. Called and left voicemail requesting call back to reschedule follow up appointment. Reminded patient we have not seen him since July, and that we need to see him to ensure his pump is functioning properly and refill his medications. Provided office phone number [phone] for call back.    Alyce Pagan RN VAD Coordinator  Office: [phone]  24/7 Pager: [phone]

## 2018-07-23 ENCOUNTER — Encounter (HOSPITAL_COMMUNITY): Payer: Self-pay

## 2018-07-28 ENCOUNTER — Telehealth (HOSPITAL_COMMUNITY): Payer: Self-pay | Admitting: Pharmacist

## 2018-07-28 ENCOUNTER — Ambulatory Visit (HOSPITAL_COMMUNITY): Payer: Self-pay | Admitting: Pharmacist

## 2018-07-28 DIAGNOSIS — Z95811 Presence of heart assist device: Secondary | ICD-10-CM

## 2018-07-28 LAB — POCT INR: INR: 2 (ref 2–3)

## 2018-07-28 NOTE — Telephone Encounter (Signed)
Left vm to continue current warfarin dose.

## 2018-08-11 ENCOUNTER — Telehealth (HOSPITAL_COMMUNITY): Payer: Self-pay | Admitting: *Deleted

## 2018-08-11 NOTE — Telephone Encounter (Signed)
Patient had called the office this morning and left voicemail requesting an appointment. I returned phone call and call went straight to voicemail. Left message requesting call back to schedule appointment.   Alyce Pagan RN VAD Coordinator  Office: [phone]  24/7 Pager: [phone]

## 2018-08-21 ENCOUNTER — Other Ambulatory Visit (HOSPITAL_COMMUNITY): Payer: Self-pay | Admitting: *Deleted

## 2018-08-21 DIAGNOSIS — I5022 Chronic systolic (congestive) heart failure: Secondary | ICD-10-CM

## 2018-08-21 DIAGNOSIS — Z7901 Long term (current) use of anticoagulants: Secondary | ICD-10-CM

## 2018-08-21 DIAGNOSIS — Z95811 Presence of heart assist device: Secondary | ICD-10-CM

## 2018-08-22 ENCOUNTER — Ambulatory Visit (HOSPITAL_COMMUNITY): Payer: Self-pay | Admitting: Pharmacist

## 2018-08-22 ENCOUNTER — Ambulatory Visit (HOSPITAL_COMMUNITY)
Admission: RE | Admit: 2018-08-22 | Discharge: 2018-08-22 | Disposition: A | Discharge status: Home / Self Care | Payer: Medicare HMO | Source: Ambulatory Visit | Attending: Internal Medicine | Admitting: Internal Medicine

## 2018-08-22 VITALS — BP 90/0 | HR 87 | Ht 71.0 in | Wt 207.0 lb

## 2018-08-22 DIAGNOSIS — Z7901 Long term (current) use of anticoagulants: Secondary | ICD-10-CM

## 2018-08-22 DIAGNOSIS — I10 Essential (primary) hypertension: Secondary | ICD-10-CM | POA: Diagnosis not present

## 2018-08-22 DIAGNOSIS — Z95811 Presence of heart assist device: Secondary | ICD-10-CM

## 2018-08-22 DIAGNOSIS — Z79899 Other long term (current) drug therapy: Secondary | ICD-10-CM | POA: Insufficient documentation

## 2018-08-22 DIAGNOSIS — I5022 Chronic systolic (congestive) heart failure: Secondary | ICD-10-CM

## 2018-08-22 DIAGNOSIS — I110 Hypertensive heart disease with heart failure: Secondary | ICD-10-CM | POA: Diagnosis present

## 2018-08-22 LAB — COMPREHENSIVE METABOLIC PANEL
ALK PHOS: 59 U/L (ref 38–126)
ALT: 17 U/L (ref 0–44)
AST: 25 U/L (ref 15–41)
Albumin: 3.6 g/dL (ref 3.5–5.0)
Anion gap: 8 (ref 5–15)
BILIRUBIN TOTAL: 1.6 mg/dL — AB (ref 0.3–1.2)
BUN: 8 mg/dL (ref 8–23)
CO2: 23 mmol/L (ref 22–32)
Calcium: 8.9 mg/dL (ref 8.9–10.3)
Chloride: 107 mmol/L (ref 98–111)
Creatinine, Ser: 1.28 mg/dL — ABNORMAL HIGH (ref 0.61–1.24)
GFR calc Af Amer: >60 mL/min (ref >60)
Glucose, Bld: 109 mg/dL — ABNORMAL HIGH (ref 70–99)
POTASSIUM: 3.8 mmol/L (ref 3.5–5.1)
Sodium: 138 mmol/L (ref 135–145)
Total Protein: 8.3 g/dL — ABNORMAL HIGH (ref 6.5–8.1)

## 2018-08-22 LAB — PREALBUMIN: PREALBUMIN: 19.2 mg/dL (ref 18–38)

## 2018-08-22 LAB — CBC
HCT: 37.9 % — ABNORMAL LOW (ref 39.0–52.0)
Hemoglobin: 11.7 g/dL — ABNORMAL LOW (ref 13.0–17.0)
MCH: 32.2 pg (ref 26.0–34.0)
MCHC: 30.9 g/dL (ref 30.0–36.0)
MCV: 104.4 fL — AB (ref 80.0–100.0)
NRBC: 0 % (ref 0.0–0.2)
Platelets: 251 10*3/uL (ref 150–400)
RBC: 3.63 MIL/uL — ABNORMAL LOW (ref 4.22–5.81)
RDW: 12.1 % (ref 11.5–15.5)
WBC: 3.6 10*3/uL — ABNORMAL LOW (ref 4.0–10.5)

## 2018-08-22 LAB — LACTATE DEHYDROGENASE: LDH: 244 U/L — AB (ref 98–192)

## 2018-08-22 LAB — MAGNESIUM: Magnesium: 2.1 mg/dL (ref 1.7–2.4)

## 2018-08-22 LAB — PROTIME-INR
INR: 1.95
Prothrombin Time: 22 seconds — ABNORMAL HIGH (ref 11.4–15.2)

## 2018-08-22 NOTE — Progress Notes (Signed)
Patient presents for 7 month follow up. Pt denies any VAD alarms since lead re-build.    Pt has a transplant evalution w/[facility] in October. Pt states that he needs to do fundraising and change his insurance prior to listing. Pt does not have follow up appt with them.   Pt has not been to clinic in 7 months, we discussed the need for follow up with [place]. Pt assures me that he will start coming to his clinic appts.  Vital Signs:  Doppler Pressure: 90 Automatc BP:  110/55 (86) HR:  87 SPO2: 98  Weight: 207 lb w/o eqt Last weight: 200.8 lb Home weights: Does not wiegh  VAD Indication: DT-eval at [facility] - requested repeat CT chest in Aug due to "spot on lung" 01/21/17 - info sent to [facility]. appt on April 29th has f/u on July 29th per pt  VAD interrogation & Equipment Management (reviewed with Dr. Shirlee Latch): Speed:9400 Flow: 4.6 Power:  5.8 w PI:  5.7  Alarms: few low voltage advisories Events: rare  Fixed speed 9400 Low speed limit: 8800  Primary Controller: Replace back up battery in 11 months Back up controller: Replace back up battery in 20 months.  Annual Equipment Maintenance on UBC/PM performed today 6/19.  I reviewed the LVAD parameters from todayand compared the results to the patient's prior recorded data.LVAD interrogation was NEGATIVEfor significant power changes, negativefor clinicalalarms and STABLEfor PI events/speed drops. Pt is performing daily controller and system monitor self tests along with completing weekly and monthly maintenance for LVAD equipment.  LVAD equipment check completed and is in good working order. Back-up equipment present.   Exit Site Care: Drive line is being maintained weekly by patient. Drive line exit site well healed and incorporated. The velour is fully implanted at exit site. Dressing dry and intact. No erythema or drainage. Stabilization device present and accurately applied. Pt denies fever or chills. Pt given 15 weekly  kits and 13 anchors today.  Significant Events on VAD Support: 08/08/17 - drive line fracture with lead re-build  Device:none  BP &Labs:  MAP: 90 MAP  Hgb  - 11.7. Denies S/S of bleeding. Specifically denies melena/BRBPR or nosebleeds.  LDH  244 - established baseline of 280- 320. Denies tea-colored urine. No power elevations noted on interrogation.  Patient Instructions: 1. No change in meds. 2. Return to VAD clinic in 3 months for annual maintenance and intermacs.    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      He is no longer taking ASA 81.  While on ASA, he was having 2-3 major nosebleeds a week.  None off ASA.  He has tried a couple of times to restart with no success.  He remains on warfarin with no BRBPR or melena.   He was admitted in 1/19 with driveline fracture.  This was able to be repaired.  No alarms since that time.    He presents today for regular follow up. Last seen 12/2017. He is doing well overall. Followed at [facility] for transplant, but has been a couple of months. He was instructed to get Rx drug coverage (which he has) and to fundraise (which he is  working on). He needs to call and follow up with them now that this is underway. He denies exertional dyspnea, but admits he is not very active. No orthopnea or PND. Takes lasix once a week or so, for bloating. No edema. Gets occasional lightheadedness with rapid standing. MAPs stable. Taking all other meds as directed. Denies problems with driveline. Occasional with his lightheadedness, he gets "tunnel vision.". No other neuro symptoms. He is working on his own streaming service to make  money.   LVAD interrogation  Please see LVAD nurse's note from today.   Labs (5/17): creatinine 1.05 => 0.91, HCT 30, LDH 235 => 294 Labs (6/17): K 3.7, creatinine 1.17, LDL 262 => 281, HCT 36.3, INR 2.6 Labs (8/17): hgb 11.2, LDH 243, creatinine 1.09 Labs (10/17): K 4.1, creatinine 1.11, hgb 12.4, LDH 262 Labs (12/17): LDH 284, K 4.2, creatinine 1.14, hgb 11.5 Labs (9/18): hgb 11.9 Labs (1/19): K 3.8, creatinine 1.2, hgb 11.6 Labs (2/19): K 4, creatinine 1.21, hgb 12.3 Labs (4/19): K 3.9, creatinine 1.28  PMH: 1. Chronic systolic CHF: Nonischemic cardiomyopathy.  Cause uncertain => not a heavy drinker, had one sister with what sounds like sudden death. Possible viral myocarditis.   - LHC/RHC (4/27): No angiographic CAD.  Mean RA 22, PA 57/34, mean PCWP 35, CI 1.39, PVR 2.6 WU. - Echo (4/17): Severely dilated LV with EF 10-15%.  - Cardiac MRI (4/17): EF 8%, severely dilated LV, LV thrombus, mildly dilated RV with moderately decreased systolic function, patchy inferior LGE pattern could be consistent with prior myocarditis.  - Heartmate II LVAD placed 11/21/15.  - Echo 5/18 with EF 10-15% 2. LV thrombus.  3. HTN 4. Epistaxis  FH: No history of premature CAD or cardiomyopathy.   SH: Divorced, lives part-time in [place] and part-time in [place], worked as Art gallery manager.  Nonsmoker.  Rare ETOH.   Review of systems complete and found to be negative unless listed in HPI.    Current Outpatient Medications  Medication Sig Dispense Refill  . acetaminophen (TYLENOL) 500 MG tablet Take 1,000 mg by mouth daily as needed for moderate pain or headache.     . furosemide (LASIX) 20 MG tablet TAKE 1 TABLET (20 MG TOTAL) BY MOUTH AS NEEDED. 30 tablet 3  . magnesium hydroxide (MILK OF MAGNESIA) 400 MG/5ML suspension Take 15 mLs by mouth daily as needed for mild constipation.    Marland Kitchen oxyCODONE (ROXICODONE) 15 MG immediate release tablet Take 15 mg by mouth 2 (two) times daily.  0  . sacubitril-valsartan  (ENTRESTO) 49-51 MG Take 1 tablet by mouth 2 (two) times daily. 60 tablet 11  . tadalafil (CIALIS) 5 MG tablet Take 1 tablet (5 mg total) by mouth daily. 30 tablet 2  . tetrahydrozoline 0.05 % ophthalmic solution Place 1 drop into both eyes as needed (dry, red eyes).    . warfarin (COUMADIN) 5 MG tablet Take 5 mg by mouth daily. Except 7.5 mg (1 and 1/2 tablets) on Monday and Friday    . Blood Pressure Monitoring (BLOOD PRESSURE CUFF) MISC 1 Units by Does not apply route daily. (Patient not taking: Reported on 01/01/2018) 1 each 0   No current facility-administered medications for this encounter.    Vitals:   08/22/18 1233 08/22/18 1234  BP: (!) 110/55 (!) 90/0  Pulse: 87   SpO2: 98%   Weight: 93.9 kg (207 lb)   Height: 5\' 11"  (1.803 m)    Wt Readings from Last 3 Encounters:  01/01/18 91.1 kg (200  lb 12.8 oz)  10/21/17 92.5 kg (204 lb)  08/22/17 91.6 kg (202 lb)    General: Well appearing this am. NAD.  HEENT: Normal. Neck: Supple, JVP 7-8 cm. Carotids OK.  Cardiac:  Mechanical heart sounds with LVAD hum present.  Lungs:  CTAB, normal effort.  Abdomen:  NT, ND, no HSM. No bruits or masses. +BS  LVAD exit site: Well-healed and incorporated. Dressing dry and intact. No erythema or drainage. Stabilization device present and accurately applied. Driveline dressing changed daily per sterile technique. Extremities:  Warm and dry. No cyanosis, clubbing, rash, or edema.  Neuro:  Alert & oriented x 3. Cranial nerves grossly intact. Moves all 4 extremities w/o difficulty. Affect pleasant     Assessment/Plan:  1. HeartMate II LVAD: Patient is doing well symptomatically, NYHA class I-II.  Admitted for driveline repair in 1/19. Volume status stable to dry on exam. MAP stable.  - VAD interrogated personally. Parameters stable.   - He may be over-diuresing himself. Continue lasix as needed.  - Continue Entresto 49/51, he needs to take bid rather than daily.       - Continue warfarin with INR goal  2-2.5.  - INR 1.95. He is off ASA due to intractable nose bleeds when taking ASA.  - LDH 244.  - Ongoing transplant evaluation at [facility]. 2. Chronic systolic CHF:  - NYHA I-II symptoms.  - Encouraged to exercise regularly.  3. LV thrombus:  - Noted on pre-op cMRI.  Continue warfarin.  Labs as above.                                                                                                                                                                  He is doing well overall despite intermittent follow up. Encouraged to follow up every 2-3 months so we can continue to monitor for problems. Strongly encouraged to follow up closely with [facility]plant team.   I reviewed the LVAD parameters from today, and compared the results to the patient's prior recorded data.  No programming changes were made.  The LVAD is functioning within specified parameters.  The patient performs LVAD self-test daily.  LVAD interrogation was negative for any significant power changes, alarms or PI events/speed drops.  LVAD equipment check completed and is in good working order.  Back-up equipment present.   LVAD education done on emergency procedures and precautions and reviewed exit site care.                        Graciella Freer, PA-C  08/22/18   Greater than 50% of the 45 minute visit was spent in counseling/coordination of care regarding disease state education, salt/fluid restriction, sliding scale diuretics, and medication compliance.

## 2018-08-22 NOTE — Progress Notes (Signed)
Patient presents for 7 month follow up. Pt denies any VAD alarms since lead re-build.    Pt has a transplant evalution w/[facility] in October. Pt states that he needs to do fundraising and change his insurance prior to listing. Pt does not have follow up appt with them.   Pt has not been to clinic in 7 months, we discussed the need for follow up with [place]. Pt assures me that he will start coming to his clinic appts.  Vital Signs:  Doppler Pressure: 90 Automatc BP:  110/55 (86) HR:  87 SPO2: 98  Weight: 207 lb w/o eqt Last weight: 200.8 lb Home weights: Does not wiegh   VAD Indication: DT-eval at [facility] - requested repeat CT chest in Aug due to "spot on lung" 01/21/17 - info sent to [facility]. appt on April 29th has f/u on July 29th per pt  VAD interrogation & Equipment Management (reviewed with Dr. Shirlee Latch): Speed:9400 Flow: 4.6 Power:  5.8 w    PI:  5.7  Alarms: few low voltage advisories Events: rare  Fixed speed 9400 Low speed limit: 8800  Primary Controller:  Replace back up battery in 11 months Back up controller:   Replace back up battery in 20 months.  Annual Equipment Maintenance on UBC/PM performed today 6/19.  I reviewed the LVAD parameters from today and compared the results to the patient's prior recorded data. LVAD interrogation was NEGATIVE for significant power changes, negative for clinical alarms and STABLE for PI events/speed drops. Pt is performing daily controller and system monitor self tests along with completing weekly and monthly maintenance for LVAD equipment.  LVAD equipment check completed and is in good working order. Back-up equipment present.   Exit Site Care: Drive line is being maintained weekly  by patient. Drive line exit site well healed and incorporated. The velour is fully implanted at exit site. Dressing dry and intact. No erythema or drainage. Stabilization device present and accurately applied. Pt denies fever or chills. Pt given 15 weekly kits  and 13 anchors today.  Significant Events on VAD Support: 08/08/17 - drive line fracture with lead re-build  Device:none  BP & Labs:  MAP: 90 MAP  Hgb  - 11.7. Denies S/S of bleeding. Specifically denies melena/BRBPR or nosebleeds.  LDH  244 - established baseline of 280- 320. Denies tea-colored urine. No power elevations noted on interrogation.  Patient Instructions: 1. No change in meds. 2. Return to VAD clinic in 3 months for annual maintenance and intermacs.    Carlton Adam RN VAD Coordinator   Office: [phone] 24/7 Emergency VAD Pager: [phone]

## 2018-09-04 ENCOUNTER — Ambulatory Visit (HOSPITAL_COMMUNITY): Payer: Self-pay | Admitting: Pharmacist

## 2018-09-04 DIAGNOSIS — Z95811 Presence of heart assist device: Secondary | ICD-10-CM

## 2018-09-04 LAB — POCT INR: INR: 2 (ref 2.0–3.0)

## 2018-09-23 LAB — POCT INR: INR: 1.9 — AB (ref 2.0–3.0)

## 2018-09-24 ENCOUNTER — Ambulatory Visit (HOSPITAL_COMMUNITY): Payer: Self-pay | Admitting: Pharmacist

## 2018-09-24 DIAGNOSIS — Z95811 Presence of heart assist device: Secondary | ICD-10-CM

## 2018-09-25 ENCOUNTER — Telehealth (HOSPITAL_COMMUNITY): Payer: Self-pay | Admitting: *Deleted

## 2018-09-25 NOTE — Telephone Encounter (Signed)
[facility] financial contacted VAD coordinator requesting information on [employer] authorization for VAD supplies.   Called Transplant/VAD case manager at [employer] and left message requesting authorization for this patient for VAD visits/supplies. Pt name, [employer] account, VAD implant date, and our contact information left.  Will follow up with [facility] financial (April Elvina Sidle) with any follow up from [facility].  Hessie Diener RN, VAD Coordinator [phone]

## 2018-09-26 ENCOUNTER — Encounter (HOSPITAL_COMMUNITY): Payer: Self-pay | Admitting: Unknown Physician Specialty

## 2018-09-26 NOTE — Progress Notes (Signed)
Received approval from [facility] for DME for VAD dressing supplies. Auth # [id number]. Approval will restart July 16, 2018.  Carlton Adam RN, BSN VAD Coordinator 24/7 Pager [phone]

## 2018-10-01 ENCOUNTER — Telehealth (HOSPITAL_COMMUNITY): Payer: Self-pay | Admitting: Licensed Clinical Social Worker

## 2018-10-01 NOTE — Telephone Encounter (Signed)
CSW contacted patient to assure food, medications and confirmation of VAD pager number if concerns or emergency arise. Patient instructed to stay home and use of proper hygiene and call if needed.  Patient informed that VAD team will call the day before any upcoming appointments with instructions due to current CoVid 19 outbreak. Patient verbalizes understanding and denies any current concerns. Jackie Yesika Rispoli, LCSW, CCSW-MCS [phone]  

## 2018-10-02 ENCOUNTER — Other Ambulatory Visit: Payer: Self-pay | Admitting: *Deleted

## 2018-10-02 ENCOUNTER — Other Ambulatory Visit: Payer: Self-pay | Admitting: Internal Medicine

## 2018-10-02 ENCOUNTER — Other Ambulatory Visit (HOSPITAL_COMMUNITY): Payer: Self-pay | Admitting: *Deleted

## 2018-10-02 DIAGNOSIS — I10 Essential (primary) hypertension: Secondary | ICD-10-CM

## 2018-10-02 DIAGNOSIS — I5043 Acute on chronic combined systolic (congestive) and diastolic (congestive) heart failure: Secondary | ICD-10-CM

## 2018-10-02 MED ORDER — SACUBITRIL-VALSARTAN 49-51 MG PO TABS: 1.0000 | ORAL_TABLET | Freq: Two times a day (BID) | ORAL | 11 refills | Status: AC

## 2018-10-03 ENCOUNTER — Other Ambulatory Visit (HOSPITAL_COMMUNITY): Payer: Self-pay | Admitting: Cardiology

## 2018-10-09 ENCOUNTER — Telehealth (HOSPITAL_COMMUNITY): Payer: Self-pay | Admitting: Licensed Clinical Social Worker

## 2018-10-09 NOTE — Telephone Encounter (Signed)
CSW contacted patient/caregiver to follow up on status with coronavirus and if anything needed. Patient instructed to stay home and use of proper hygiene and call if needed. Patient denies any concerns at this time and verbalizes understanding of process for contacting VAD Coordinators if needed. CSW continues to follow as needed. Jackie Tulip Meharg, LCSW, CCSW-MCS [phone] 

## 2018-10-16 ENCOUNTER — Telehealth (HOSPITAL_COMMUNITY): Payer: Self-pay | Admitting: *Deleted

## 2018-10-16 NOTE — Telephone Encounter (Signed)
Spoke with patient regarding need for INR check. Patient states he went to the lab "the other day" and "they were packed" so he left. States that the outpatient lab is directing patients to go to a different lab at the hospital, but he will try to go tomorrow for INR check.   Alyce Pagan RN VAD Coordinator  Office: [phone]  24/7 Pager: [phone]

## 2018-10-17 ENCOUNTER — Ambulatory Visit (HOSPITAL_COMMUNITY): Payer: Self-pay | Admitting: Pharmacist

## 2018-10-17 DIAGNOSIS — Z95811 Presence of heart assist device: Secondary | ICD-10-CM

## 2018-10-17 LAB — POCT INR: INR: 2.8 (ref 2–3)

## 2018-10-21 ENCOUNTER — Encounter (HOSPITAL_COMMUNITY): Payer: Medicare HMO

## 2018-10-22 ENCOUNTER — Telehealth (HOSPITAL_COMMUNITY): Payer: Self-pay | Admitting: *Deleted

## 2018-10-22 NOTE — Telephone Encounter (Signed)
Completed PA for pt's Entresto via [facility]  Med approved through 07/16/2019, approval # [id number]

## 2018-11-14 DEATH — deceased

## 2018-11-20 ENCOUNTER — Encounter (HOSPITAL_COMMUNITY): Payer: Medicare HMO
